# Patient Record
Sex: Male | Born: 1945 | Race: Black or African American | Hispanic: No | Marital: Married | Smoking: Never smoker
Health system: Southern US, Community
[De-identification: ages and names within clinical notes are randomized; demographics above are authoritative.]

## PROBLEM LIST (undated history)

## (undated) DIAGNOSIS — I10 Essential (primary) hypertension: Secondary | ICD-10-CM

## (undated) DIAGNOSIS — T884XXA Failed or difficult intubation, initial encounter: Secondary | ICD-10-CM

## (undated) DIAGNOSIS — I428 Other cardiomyopathies: Secondary | ICD-10-CM

## (undated) DIAGNOSIS — K219 Gastro-esophageal reflux disease without esophagitis: Secondary | ICD-10-CM

## (undated) DIAGNOSIS — Z973 Presence of spectacles and contact lenses: Secondary | ICD-10-CM

## (undated) DIAGNOSIS — K449 Diaphragmatic hernia without obstruction or gangrene: Secondary | ICD-10-CM

## (undated) DIAGNOSIS — R399 Unspecified symptoms and signs involving the genitourinary system: Secondary | ICD-10-CM

## (undated) DIAGNOSIS — M722 Plantar fascial fibromatosis: Secondary | ICD-10-CM

## (undated) DIAGNOSIS — Z8719 Personal history of other diseases of the digestive system: Secondary | ICD-10-CM

## (undated) DIAGNOSIS — Z86010 Personal history of colonic polyps: Secondary | ICD-10-CM

## (undated) DIAGNOSIS — E785 Hyperlipidemia, unspecified: Secondary | ICD-10-CM

## (undated) DIAGNOSIS — M1990 Unspecified osteoarthritis, unspecified site: Secondary | ICD-10-CM

## (undated) DIAGNOSIS — K5730 Diverticulosis of large intestine without perforation or abscess without bleeding: Secondary | ICD-10-CM

## (undated) DIAGNOSIS — E119 Type 2 diabetes mellitus without complications: Secondary | ICD-10-CM

## (undated) DIAGNOSIS — T7840XA Allergy, unspecified, initial encounter: Secondary | ICD-10-CM

## (undated) DIAGNOSIS — R3916 Straining to void: Secondary | ICD-10-CM

## (undated) DIAGNOSIS — R972 Elevated prostate specific antigen [PSA]: Secondary | ICD-10-CM

## (undated) DIAGNOSIS — R3914 Feeling of incomplete bladder emptying: Secondary | ICD-10-CM

## (undated) DIAGNOSIS — C801 Malignant (primary) neoplasm, unspecified: Secondary | ICD-10-CM

## (undated) DIAGNOSIS — N400 Enlarged prostate without lower urinary tract symptoms: Secondary | ICD-10-CM

## (undated) DIAGNOSIS — D649 Anemia, unspecified: Secondary | ICD-10-CM

## (undated) DIAGNOSIS — Z8619 Personal history of other infectious and parasitic diseases: Secondary | ICD-10-CM

## (undated) DIAGNOSIS — D689 Coagulation defect, unspecified: Secondary | ICD-10-CM

## (undated) DIAGNOSIS — Z860101 Personal history of adenomatous and serrated colon polyps: Secondary | ICD-10-CM

## (undated) HISTORY — PX: ESOPHAGOGASTRODUODENOSCOPY: SHX1529

## (undated) HISTORY — PX: CARDIAC CATHETERIZATION: SHX172

## (undated) HISTORY — DX: Malignant (primary) neoplasm, unspecified: C80.1

## (undated) HISTORY — DX: Anemia, unspecified: D64.9

## (undated) HISTORY — DX: Allergy, unspecified, initial encounter: T78.40XA

## (undated) HISTORY — DX: Plantar fascial fibromatosis: M72.2

## (undated) HISTORY — DX: Hyperlipidemia, unspecified: E78.5

## (undated) HISTORY — DX: Coagulation defect, unspecified: D68.9

## (undated) HISTORY — DX: Gastro-esophageal reflux disease without esophagitis: K21.9

## (undated) HISTORY — DX: Unspecified osteoarthritis, unspecified site: M19.90

## (undated) HISTORY — PX: APPENDECTOMY: SHX54

## (undated) HISTORY — DX: Diaphragmatic hernia without obstruction or gangrene: K44.9

## (undated) HISTORY — PX: COLONOSCOPY: SHX174

## (undated) HISTORY — PX: EYE SURGERY: SHX253

## (undated) HISTORY — DX: Failed or difficult intubation, initial encounter: T88.4XXA

---

## 1999-08-06 ENCOUNTER — Encounter: Admission: RE | Admit: 1999-08-06 | Discharge: 1999-09-08 | Payer: Self-pay | Admitting: *Deleted

## 2004-04-17 ENCOUNTER — Ambulatory Visit: Payer: Self-pay | Admitting: Family Medicine

## 2004-11-11 ENCOUNTER — Ambulatory Visit: Payer: Self-pay | Admitting: Family Medicine

## 2004-11-17 ENCOUNTER — Ambulatory Visit: Payer: Self-pay | Admitting: Family Medicine

## 2005-03-10 ENCOUNTER — Ambulatory Visit: Payer: Self-pay | Admitting: Family Medicine

## 2005-03-10 LAB — CONVERTED CEMR LAB: PSA: 0.78 ng/mL

## 2005-04-30 ENCOUNTER — Ambulatory Visit: Payer: Self-pay | Admitting: Family Medicine

## 2005-12-17 ENCOUNTER — Ambulatory Visit: Payer: Self-pay | Admitting: Family Medicine

## 2005-12-17 LAB — CONVERTED CEMR LAB: PSA: 0.86 ng/mL

## 2005-12-21 ENCOUNTER — Ambulatory Visit: Payer: Self-pay | Admitting: Family Medicine

## 2006-01-20 ENCOUNTER — Ambulatory Visit: Payer: Self-pay | Admitting: Family Medicine

## 2006-07-01 ENCOUNTER — Ambulatory Visit: Payer: Self-pay | Admitting: Family Medicine

## 2006-07-01 LAB — CONVERTED CEMR LAB
Cholesterol: 164 mg/dL (ref 0–200)
HDL: 36.2 mg/dL — ABNORMAL LOW (ref >39.0)
VLDL: 27 mg/dL (ref 0–40)

## 2006-07-06 ENCOUNTER — Ambulatory Visit: Payer: Self-pay | Admitting: Family Medicine

## 2007-01-02 ENCOUNTER — Encounter: Payer: Self-pay | Admitting: Family Medicine

## 2007-01-16 ENCOUNTER — Ambulatory Visit: Payer: Self-pay | Admitting: Family Medicine

## 2007-01-16 LAB — CONVERTED CEMR LAB
BUN: 8 mg/dL (ref 6–23)
GFR calc Af Amer: 110 mL/min
GFR calc non Af Amer: 91 mL/min
Glucose, Bld: 106 mg/dL — ABNORMAL HIGH (ref 70–99)
HDL: 30.1 mg/dL — ABNORMAL LOW (ref >39.0)
PSA: 0.95 ng/mL (ref 0.10–4.00)
Sodium: 144 meq/L (ref 135–145)
Triglycerides: 152 mg/dL — ABNORMAL HIGH (ref 0–149)

## 2007-01-17 ENCOUNTER — Ambulatory Visit: Payer: Self-pay | Admitting: Family Medicine

## 2007-01-18 ENCOUNTER — Ambulatory Visit: Payer: Self-pay | Admitting: Gastroenterology

## 2007-02-13 ENCOUNTER — Ambulatory Visit: Payer: Self-pay | Admitting: Family Medicine

## 2007-02-14 ENCOUNTER — Encounter (INDEPENDENT_AMBULATORY_CARE_PROVIDER_SITE_OTHER): Payer: Self-pay | Admitting: *Deleted

## 2007-10-09 ENCOUNTER — Ambulatory Visit: Payer: Self-pay | Admitting: Gastroenterology

## 2007-10-23 ENCOUNTER — Ambulatory Visit: Payer: Self-pay | Admitting: Gastroenterology

## 2008-04-01 ENCOUNTER — Ambulatory Visit: Payer: Self-pay | Admitting: Family Medicine

## 2008-04-01 LAB — CONVERTED CEMR LAB
ALT: 31 units/L (ref 0–53)
AST: 26 units/L (ref 0–37)
Alkaline Phosphatase: 48 units/L (ref 39–117)
Bilirubin, Direct: 0.1 mg/dL (ref 0.0–0.3)
CO2: 33 meq/L — ABNORMAL HIGH (ref 19–32)
Chloride: 101 meq/L (ref 96–112)
Cholesterol: 138 mg/dL (ref 0–200)
GFR calc non Af Amer: 72 mL/min
LDL Cholesterol: 86 mg/dL (ref 0–99)
PSA: 1.02 ng/mL (ref 0.10–4.00)
Potassium: 4.4 meq/L (ref 3.5–5.1)
TSH: 2.28 microintl units/mL (ref 0.35–5.50)
Total Bilirubin: 1.2 mg/dL (ref 0.3–1.2)
Total CHOL/HDL Ratio: 4.3

## 2008-04-03 ENCOUNTER — Ambulatory Visit: Payer: Self-pay | Admitting: Family Medicine

## 2008-04-03 DIAGNOSIS — K5730 Diverticulosis of large intestine without perforation or abscess without bleeding: Secondary | ICD-10-CM | POA: Insufficient documentation

## 2008-04-12 ENCOUNTER — Ambulatory Visit: Payer: Self-pay | Admitting: Family Medicine

## 2008-04-12 LAB — CONVERTED CEMR LAB
OCCULT 1: NEGATIVE
OCCULT 2: NEGATIVE
OCCULT 3: NEGATIVE

## 2008-04-15 ENCOUNTER — Encounter (INDEPENDENT_AMBULATORY_CARE_PROVIDER_SITE_OTHER): Payer: Self-pay | Admitting: *Deleted

## 2008-09-09 ENCOUNTER — Inpatient Hospital Stay (HOSPITAL_COMMUNITY): Admission: AD | Admit: 2008-09-09 | Discharge: 2008-09-10 | Payer: Self-pay | Admitting: Cardiology

## 2008-09-09 ENCOUNTER — Telehealth: Payer: Self-pay | Admitting: Family Medicine

## 2008-09-09 ENCOUNTER — Ambulatory Visit: Payer: Self-pay | Admitting: Family Medicine

## 2008-09-09 ENCOUNTER — Ambulatory Visit: Payer: Self-pay | Admitting: Cardiology

## 2008-09-09 DIAGNOSIS — R079 Chest pain, unspecified: Secondary | ICD-10-CM

## 2008-09-10 ENCOUNTER — Encounter: Payer: Self-pay | Admitting: Cardiology

## 2008-09-18 ENCOUNTER — Ambulatory Visit: Payer: Self-pay | Admitting: Family Medicine

## 2008-09-20 ENCOUNTER — Ambulatory Visit: Payer: Self-pay | Admitting: Cardiology

## 2008-09-20 ENCOUNTER — Encounter: Payer: Self-pay | Admitting: Cardiology

## 2008-10-14 DIAGNOSIS — K219 Gastro-esophageal reflux disease without esophagitis: Secondary | ICD-10-CM

## 2008-10-14 DIAGNOSIS — I2510 Atherosclerotic heart disease of native coronary artery without angina pectoris: Secondary | ICD-10-CM

## 2008-11-29 ENCOUNTER — Ambulatory Visit: Payer: Self-pay | Admitting: Internal Medicine

## 2008-11-29 ENCOUNTER — Inpatient Hospital Stay (HOSPITAL_COMMUNITY): Admission: EM | Admit: 2008-11-29 | Discharge: 2008-12-04 | Payer: Self-pay | Admitting: Emergency Medicine

## 2008-11-29 ENCOUNTER — Ambulatory Visit: Payer: Self-pay | Admitting: Gastroenterology

## 2008-11-29 DIAGNOSIS — D500 Iron deficiency anemia secondary to blood loss (chronic): Secondary | ICD-10-CM | POA: Insufficient documentation

## 2008-11-30 ENCOUNTER — Encounter: Payer: Self-pay | Admitting: Family Medicine

## 2008-12-09 ENCOUNTER — Ambulatory Visit: Payer: Self-pay | Admitting: Family Medicine

## 2008-12-09 ENCOUNTER — Telehealth: Payer: Self-pay | Admitting: Physician Assistant

## 2008-12-09 DIAGNOSIS — K625 Hemorrhage of anus and rectum: Secondary | ICD-10-CM

## 2008-12-10 LAB — CONVERTED CEMR LAB
HCT: 33.2 % — ABNORMAL LOW (ref 39.0–52.0)
Lymphocytes Relative: 40.1 % (ref 12.0–46.0)
Monocytes Relative: 6.5 % (ref 3.0–12.0)
Neutrophils Relative %: 46.6 % (ref 43.0–77.0)
Platelets: 394 10*3/uL (ref 150.0–400.0)
RDW: 13.3 % (ref 11.5–14.6)
WBC: 8.9 10*3/uL (ref 4.5–10.5)

## 2008-12-16 ENCOUNTER — Ambulatory Visit: Payer: Self-pay | Admitting: Family Medicine

## 2009-01-30 ENCOUNTER — Ambulatory Visit: Payer: Self-pay | Admitting: Family Medicine

## 2009-02-17 ENCOUNTER — Ambulatory Visit: Payer: Self-pay | Admitting: Family Medicine

## 2009-02-17 LAB — CONVERTED CEMR LAB
Basophils Relative: 0.7 % (ref 0.0–3.0)
MCHC: 33.8 g/dL (ref 30.0–36.0)
MCV: 86.3 fL (ref 78.0–100.0)
Neutrophils Relative %: 42.8 % — ABNORMAL LOW (ref 43.0–77.0)
RBC: 5.3 M/uL (ref 4.22–5.81)
RDW: 13.9 % (ref 11.5–14.6)

## 2009-02-20 ENCOUNTER — Ambulatory Visit: Payer: Self-pay | Admitting: Family Medicine

## 2009-04-16 ENCOUNTER — Ambulatory Visit: Payer: Self-pay | Admitting: Family Medicine

## 2009-04-16 LAB — CONVERTED CEMR LAB
ALT: 31 units/L (ref 0–53)
AST: 23 units/L (ref 0–37)
BUN: 7 mg/dL (ref 6–23)
Basophils Relative: 0.4 % (ref 0.0–3.0)
Bilirubin, Direct: 0.1 mg/dL (ref 0.0–0.3)
CO2: 31 meq/L (ref 19–32)
Chloride: 103 meq/L (ref 96–112)
Cholesterol: 166 mg/dL (ref 0–200)
Creatinine,U: 196.8 mg/dL
Eosinophils Relative: 1 % (ref 0.0–5.0)
Hemoglobin: 16 g/dL (ref 13.0–17.0)
LDL Cholesterol: 102 mg/dL — ABNORMAL HIGH (ref 0–99)
Lymphocytes Relative: 38.2 % (ref 12.0–46.0)
Microalb, Ur: 0.3 mg/dL (ref 0.0–1.9)
Monocytes Relative: 6.8 % (ref 3.0–12.0)
Neutro Abs: 4.5 10*3/uL (ref 1.4–7.7)
PSA: 0.88 ng/mL (ref 0.10–4.00)
Potassium: 4.3 meq/L (ref 3.5–5.1)
RBC: 5.56 M/uL (ref 4.22–5.81)
TSH: 1.65 microintl units/mL (ref 0.35–5.50)
Total Bilirubin: 1.1 mg/dL (ref 0.3–1.2)
Total CHOL/HDL Ratio: 4

## 2009-04-22 ENCOUNTER — Ambulatory Visit: Payer: Self-pay | Admitting: Family Medicine

## 2009-07-03 ENCOUNTER — Telehealth: Payer: Self-pay | Admitting: Family Medicine

## 2009-08-26 ENCOUNTER — Ambulatory Visit: Payer: Self-pay | Admitting: Family Medicine

## 2009-08-28 ENCOUNTER — Ambulatory Visit: Payer: Self-pay | Admitting: Cardiology

## 2009-12-16 ENCOUNTER — Encounter (INDEPENDENT_AMBULATORY_CARE_PROVIDER_SITE_OTHER): Payer: Self-pay | Admitting: *Deleted

## 2010-04-24 ENCOUNTER — Inpatient Hospital Stay (HOSPITAL_COMMUNITY)
Admission: EM | Admit: 2010-04-24 | Discharge: 2010-04-26 | Payer: Self-pay | Source: Home / Self Care | Attending: Internal Medicine | Admitting: Internal Medicine

## 2010-04-24 ENCOUNTER — Telehealth: Payer: Self-pay | Admitting: Family Medicine

## 2010-05-13 ENCOUNTER — Other Ambulatory Visit: Payer: Self-pay | Admitting: Family Medicine

## 2010-05-13 ENCOUNTER — Ambulatory Visit
Admission: RE | Admit: 2010-05-13 | Discharge: 2010-05-13 | Payer: Self-pay | Source: Home / Self Care | Attending: Family Medicine | Admitting: Family Medicine

## 2010-05-13 LAB — MICROALBUMIN / CREATININE URINE RATIO
Creatinine,U: 184.8 mg/dL
Microalb Creat Ratio: 0.8 mg/g (ref 0.0–30.0)
Microalb, Ur: 1.4 mg/dL (ref 0.0–1.9)

## 2010-05-13 LAB — HEPATIC FUNCTION PANEL
ALT: 27 U/L (ref 0–53)
AST: 26 U/L (ref 0–37)
Albumin: 3.7 g/dL (ref 3.5–5.2)
Alkaline Phosphatase: 51 U/L (ref 39–117)
Bilirubin, Direct: 0.1 mg/dL (ref 0.0–0.3)
Total Bilirubin: 0.8 mg/dL (ref 0.3–1.2)
Total Protein: 7.1 g/dL (ref 6.0–8.3)

## 2010-05-13 LAB — CBC WITH DIFFERENTIAL/PLATELET
Basophils Absolute: 0.1 10*3/uL (ref 0.0–0.1)
Basophils Relative: 0.8 % (ref 0.0–3.0)
Eosinophils Absolute: 0.1 10*3/uL (ref 0.0–0.7)
Eosinophils Relative: 1.3 % (ref 0.0–5.0)
HCT: 38.1 % — ABNORMAL LOW (ref 39.0–52.0)
Hemoglobin: 12.6 g/dL — ABNORMAL LOW (ref 13.0–17.0)
Lymphocytes Relative: 40.2 % (ref 12.0–46.0)
Lymphs Abs: 2.7 10*3/uL (ref 0.7–4.0)
MCHC: 33 g/dL (ref 30.0–36.0)
MCV: 86.7 fl (ref 78.0–100.0)
Monocytes Absolute: 0.4 10*3/uL (ref 0.1–1.0)
Monocytes Relative: 6 % (ref 3.0–12.0)
Neutro Abs: 3.4 10*3/uL (ref 1.4–7.7)
Neutrophils Relative %: 51.7 % (ref 43.0–77.0)
Platelets: 323 10*3/uL (ref 150.0–400.0)
RBC: 4.39 Mil/uL (ref 4.22–5.81)
RDW: 14.2 % (ref 11.5–14.6)
WBC: 6.7 10*3/uL (ref 4.5–10.5)

## 2010-05-13 LAB — BASIC METABOLIC PANEL
BUN: 9 mg/dL (ref 6–23)
CO2: 30 mEq/L (ref 19–32)
Calcium: 9.2 mg/dL (ref 8.4–10.5)
Chloride: 105 mEq/L (ref 96–112)
Creatinine, Ser: 0.9 mg/dL (ref 0.4–1.5)
GFR: 113.44 mL/min (ref >60.00)
Glucose, Bld: 98 mg/dL (ref 70–99)
Potassium: 4.6 mEq/L (ref 3.5–5.1)
Sodium: 140 mEq/L (ref 135–145)

## 2010-05-13 LAB — LIPID PANEL
Cholesterol: 139 mg/dL (ref 0–200)
HDL: 32.9 mg/dL — ABNORMAL LOW (ref >39.00)
LDL Cholesterol: 88 mg/dL (ref 0–99)
Total CHOL/HDL Ratio: 4
Triglycerides: 89 mg/dL (ref 0.0–149.0)
VLDL: 17.8 mg/dL (ref 0.0–40.0)

## 2010-05-13 LAB — PSA: PSA: 0.82 ng/mL (ref 0.10–4.00)

## 2010-05-13 LAB — IRON: Iron: 27 ug/dL — ABNORMAL LOW (ref 42–165)

## 2010-05-13 LAB — VITAMIN B12: Vitamin B-12: 624 pg/mL (ref 211–911)

## 2010-05-13 LAB — TSH: TSH: 1.64 u[IU]/mL (ref 0.35–5.50)

## 2010-05-21 ENCOUNTER — Ambulatory Visit
Admission: RE | Admit: 2010-05-21 | Discharge: 2010-05-21 | Payer: Self-pay | Source: Home / Self Care | Attending: Family Medicine | Admitting: Family Medicine

## 2010-06-11 NOTE — Assessment & Plan Note (Signed)
Summary: CPX/DLO   Vital Signs:  Patient profile:   65 year old male Weight:      210 pounds Temp:     97.7 degrees F oral Pulse rate:   92 / minute Pulse rhythm:   regular BP sitting:   122 / 74  (left arm) Cuff size:   large  Vitals Entered By: Sydell Axon LPN (May 21, 2010 10:54 AM) CC: 30 Minute checkup, had a colonoscopy 06/09 by Dr. Russella Dar   History of Present Illness: Pt here for Comp Exam. He had been admitted 12/16-18 for presumed diverticular bleed and was told to stop his ASA. He had had some nuts prior to the episode.  hre has no complaints today except for some mild acid reflux. He was given a script in the hosp for 40mg  of Simva but his nos today are great on 37 and he has tolerated this for many years at the 80mg  dose. I think his risk is low of myopathy.  Preventive Screening-Counseling & Management  Alcohol-Tobacco     Alcohol drinks/day: 0     Alcohol type: rare beer     Smoking Status: never     Passive Smoke Exposure: no  Caffeine-Diet-Exercise     Caffeine use/day: 0     Does Patient Exercise: no, trouble with feet.     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 Take 1 Tablet By Mouth At Bedtime 2)  Calcium 600 600 Mg Tabs (Calcium Carbonate) ....  Take 1 Tablet By Mouth Two Times A Day 3)  Fish Oil 1000 Mg Caps (Omega-3 Fatty Acids) .... Take 1 Tablet By Mouth Once A Day 4)  Vitamin C Cr 500 Mg Cr-Caps (Ascorbic Acid) .[name].. 1 Daily By Mouth 5)  Centrum Silver  Tabs (Multiple Vitamins-Minerals) .[name].. 1 Daily By Mouth 6)  Protonix 40 Mg Tbec (Pantoprazole Sodium) .[name].. 1 Daily By Mouth 7)  Aspirin 81 Mg Tbec (Aspirin) .... Take One By Mouth Daily  Current Medications (verified): 1)  Zocor 80 Mg Tabs (Simvastatin) .... Take 1 Tablet By Mouth At Bedtime 2)  Calcium 600 600 Mg Tabs (Calcium Carbonate) .... Take 1 Tablet By Mouth Two Times A Day 3)  Vitamin C Cr 500 Mg Cr-Caps (Ascorbic Acid) .[name].. 1 Daily By Mouth 4)  Centrum Silver  Tabs (Multiple Vitamins-Minerals) .[name].. 1 Daily By Mouth 5)  Protonix 40 Mg Tbec (Pantoprazole Sodium) .[name].. 1 Daily By Mouth  Allergies: No Known Drug Allergies  Past History:  Past Medical History: Last updated: 08/28/2009 1. Hyperlipidemia. 2. Diverticulosis: Lower GI diverticular bleed 7/10 3. GERD 4. LHC (5/10):  30-40% mid RCA stenosis, no obstructive disease.  EF 55%.  Past Surgical History: Last updated: 04/27/2010 Appendectomy [employer] Colonoscopy Russella Dar) divertics, negative polyps  ~04 Colonoscopy (  Russella Dar) divertics, negative polyps 10/23/07          ~14   HOSP CP Cath (Dr Shirlee Latch) 5/3-09/10/08 Cath 20% Stenosis Prox LAD EF 55% 09/10/08 HOSP Lower GI Bleed felt due to Divertic  Acute Fe Def Anemia  7/23-7/28/10 HOSP [facility] Presumed Lower GI Bleed  Divert 12/16-12/18/11  Family History: Last updated: 06-10-2010 Father: Deceased ? Estranged Mother: Died at age 72, colon cancer Brother  dec 64 lung cancer unknown type (smoke)  Brother A 30   Sister A  72 MI, with heart bypass and pace maker Sister dec 68 Renal Failure Nephrectomy not cancer HTN Sister A 58 HTN CV + Brother, sister x 2 HBP - none DM - none Breast/Ovarian/Uterine cancer  - none Colon cancer + Mother, polyps brother Depression  - none ETOH/Drug Abuse - none Stroke - none  Social History: Last updated: 10-Jun-2010 Marital Status: Married Children: none Occupation: Retired, Naval architect Tobacco Use - No.  Alcohol Use - no Regular Exercise - yes Drug Use - no  Risk Factors: Alcohol Use: 0 (06/10/2010) Caffeine Use: 0 (2010-06-10) Exercise: no, trouble with feet. (10-Jun-2010)  Risk Factors: Smoking Status: never (06-10-2010) Passive Smoke Exposure: no (06-10-10)  Family History: Father: Deceased ? 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  Alcohol Use - no Regular Exercise - yes Drug Use - no Does Patient Exercise:  no, trouble with feet.  Review of Systems General:  Denies chills, fatigue, fever, sweats, weakness, and weight loss. Eyes:  Denies blurring, discharge, and eye pain. ENT:  Denies decreased hearing, ear discharge, earache, and ringing in ears. CV:  Denies chest pain or discomfort, fainting, fatigue, palpitations, shortness of breath with exertion, swelling of feet, and swelling of hands. Resp:  Denies chest discomfort, cough, shortness of breath, sputum productive, and wheezing. GI:  Complains of bloody stools; denies abdominal pain, change in bowel habits, constipation, dark tarry stools, diarrhea, excessive appetite, gas, hemorrhoids, indigestion, loss of appetite, nausea, vomiting, vomiting blood, and yellowish skin color; no rectal bleeding  since admission in Dec, some heartburnn recently.. GU:  Denies  discharge, dysuria, nocturia, and urinary frequency. MS:  Denies joint pain, muscle aches, cramps, and stiffness; recent plantar fasciitis.. Derm:  Denies dryness, itching, and rash. Neuro:  Denies numbness, poor balance, tingling, and tremors.  Physical Exam  General:  Well-developed,well-nourished,in no acute distress; alert,appropriate and cooperative throughout examination.  Head:  Normocephalic and atraumatic without obvious abnormalities. No apparent alopecia or balding. Sinuses NT. Eyes:  Conjunctiva clear bilaterally.  Ears:  External ear exam shows no significant lesions or deformities.  Otoscopic examination reveals clear canals, tympanic membranes are intact bilaterally without bulging, retraction, inflammation or discharge. Hearing is grossly normal bilaterally. Nose:  External nasal examination shows no deformity or inflammation. Nasal mucosa are pink and moist without lesions or exudates. Mouth:  Oral mucosa and oropharynx without lesions or exudates.  Teeth in good repair. Mucous membranes pink. Neck:  No deformities, masses, or tenderness noted. Chest  Wall:  No deformities, masses, tenderness or gynecomastia noted.  Breasts:  No masses or gynecomastia noted Lungs:  Normal respiratory effort, chest expands symmetrically. Lungs are clear to auscultation, no crackles or wheezes. Heart:  Normal rate and regular rhythm. S1 and S2 normal without gallop, murmur, click, rub or other extra sounds. Abdomen:  Bowel sounds positive,abdomen soft and non-tender without masses, organomegaly or hernias noted. Rectal:  No external abnormalities noted. Normal sphincter tone. No rectal masses or tenderness. G neg. Genitalia:  Testes bilaterally descended without nodularity, tenderness or masses. No scrotal masses or lesions. No penis lesions or urethral discharge. Prostate:  Prostate gland firm and smooth, no enlargement, nodularity, tenderness, mass, asymmetry or induration. 30-40gms. Msk:  No  deformity or scoliosis noted of thoracic or lumbar spine.   Pulses:  R and L carotid,radial,femoral,dorsalis pedis and posterior tibial pulses are full and equal bilaterally Extremities:  No clubbing, cyanosis, edema, or deformity noted with normal full range of motion of all joints.   Neurologic:  No cranial nerve deficits noted. Station and gait are normal. Sensory, motor and coordinative functions appear intact. Skin:  Intact without suspicious lesions or rashes Cervical Nodes:  No lymphadenopathy noted Inguinal Nodes:  No significant adenopathy Psych:  Cognition and judgment appear intact. Alert and cooperative with normal attention span and concentration. No apparent delusions, illusions, hallucinations   Impression & Recommendations:  Problem # 1:  HEALTH MAINTENANCE EXAM (ICD-V70.0)  Reviewed preventive care protocols, scheduled due services, and updated immunizations. Discussed Zostavax.  Problem # 2:  HEMORRHAGE OF RECTUM AND ANUS (ICD-569.3) Assessment: Improved Resolved.  Problem # 3:  CAD, UNSPECIFIED SITE (ICD-414.00) Assessment: Unchanged  Stable. The following medications were removed from the medication list:    Aspirin 81 Mg Tbec (Aspirin) .[name]... Take one by mouth daily  Labs Reviewed: Chol: 139 (05/13/2010)   HDL: 32.90 (05/13/2010)   LDL: 88 (05/13/2010)   TG: 89.0 (05/13/2010)  Problem # 4:  HYPERCHOLESTEROLEMIA,  222/TRIG 254/ LDL 132 (ICD-272.0) Assessment: Unchanged Great control. With risk of and past h/o CAD, and the fact he has been on this for many years (at least since 08) feel the risk of myopathy outweighed by the risk of recurrent CAD. Cont 80mg  Simva. His updated medication list for this problem includes:    Zocor 80 Mg Tabs (Simvastatin) .[name]... Take 1 tablet by mouth at bedtime  Labs Reviewed: SGOT: 26 (05/13/2010)   SGPT: 27 (05/13/2010)   HDL:32.90 (05/13/2010), 40.80 (04/16/2009)  LDL:88 (05/13/2010), 102 (74/25/9563)  Chol:139 (05/13/2010),  166 (04/16/2009)  Trig:89.0 (05/13/2010), 118.0 (04/16/2009)  Problem # 5:  HYPERGLYCEMIA, 114 (ICD-790.29) Assessment: Improved  Euglycemic today. Kidney fctn fine.  Labs Reviewed: Creat: 0.9 (05/13/2010)     Problem # 6:  GERD (ICD-530.81) Assessment: Unchanged Slight sxs today. Cont Protonix and again covered prophylaxis. His updated medication list for this problem includes:    Protonix 40 Mg Tbec (Pantoprazole sodium) .[name]... 1 daily by mouth  Diagnostics Reviewed:  Discussed lifestyle modifications, diet, antacids/medications, and preventive measures. Handout provided.   Problem # 7:  SCREENING FOR MALIGNANT NEOPLASM, PROSTATE (ICD-V76.44) Assessment: Unchanged Stable exam and PSA.  Complete Medication List: 1)  Zocor 80 Mg Tabs (Simvastatin) .... Take 1 tablet by mouth at bedtime 2)  Calcium 600 600 Mg Tabs (Calcium carbonate) .... Take 1 tablet by mouth two times a day 3)  Vitamin C Cr 500 Mg Cr-caps (Ascorbic acid) .[name].. 1 daily by mouth 4)  Centrum Silver Tabs (Multiple vitamins-minerals) .[name].. 1 daily  by mouth 5)  Protonix 40 Mg Tbec (Pantoprazole sodium) .[name].. 1 daily by mouth  Patient Instructions: 1)  RTC one year, sooner as needed. 2)  Look into Zostavax. Prescriptions: PROTONIX 40 MG TBEC (PANTOPRAZOLE SODIUM) 1 daily by mouth  #30 x 12   Entered and Authorized by:   Shaune Leeks MD   Signed by:   Shaune Leeks MD on 05/21/2010   Method used:   Electronically to        [facility] [address] (retail)       [address], [address]       [place], [place]  [address]       Ph: [phone]       Fax: [phone]   RxID:   [id number] ZOCOR 80 MG TABS (SIMVASTATIN) Take 1 tablet by mouth at bedtime  #30 Each x 12   Entered and Authorized by:   Shaune Leeks MD   Signed by:   Shaune Leeks MD on 05/21/2010   Method used:   Electronically to        [facility] [address] (retail)       [address]       [place], [place]  [address]       Ph: [phone]       Fax: [phone]   RxID:   [id number]    Orders Added: 1)  Est. Patient 40-64 years [[id number]]   Immunization History:  Pneumovax Immunization History:    Pneumovax:  pneumovax (04/25/2010)  Influenza Immunization History:    Influenza:  fluvax 3+ (04/25/2010)   Immunization History:  Pneumovax Immunization History:    Pneumovax:  Pneumovax (04/25/2010)  Influenza Immunization History:    Influenza:  Fluvax 3+ (04/25/2010)  Current Allergies (reviewed today): No known allergies

## 2010-06-11 NOTE — Assessment & Plan Note (Signed)
Summary: f1y   Visit Type:  Follow-up Referring Provider:  Dr. Marca Ancona Primary Provider:  Shaune Leeks MD  CC:  Patient is having SOB due to having stomach gas.Chest discomfort occasionally .  History of Present Illness: 65 yo with history of hyperlipidemia was seen in the office last year with significant exertional chest pain.  We admitted him for LHC, which showed only 30-40 mRCA stenosis and normal LV function. Given normal cath, it was thought that his symptoms may be GI-related (significant GERD).  He is still having GERD-type symptoms despite the use of Protonix.  He has had no exertional chest pain.  He is doing well from a cardiopulmonary standpoint.    Labs (5/10):  LDL 74, HDL 26, TG 92, creatinine 6.43 Labs (12/10): LDL 102, HDL 41  Current Medications (verified): 1)  Zocor 80 Mg Tabs (Simvastatin) .... Take 1 Tablet By Mouth At Bedtime 2)  Calcium 600 600 Mg Tabs (Calcium Carbonate) .... Take 1 Tablet By Mouth Two Times A Day 3)  Fish Oil 1000 Mg Caps (Omega-3 Fatty Acids) .... Take 1 Tablet By Mouth Once A Day 4)  Vitamin C Cr 500 Mg Cr-Caps (Ascorbic Acid) .[name].. 1 Daily By Mouth 5)  Centrum Silver  Tabs (Multiple Vitamins-Minerals) .[name].. 1 Daily By Mouth 6)  Protonix 40 Mg Tbec (Pantoprazole Sodium) .[name].. 1 Daily By Mouth 7)  Aspirin 81 Mg Tbec (Aspirin) .... Take One By Mouth Daily  Allergies (verified): No Known Drug Allergies  Past History:  Past Medical History: 1. Hyperlipidemia. 2. Diverticulosis: Lower GI diverticular bleed 7/10 3. GERD 4. LHC (5/10):  30-40% mid RCA stenosis, no obstructive disease.  EF 55%.  Family History: Reviewed history from 04/22/2009 and no changes required. Father: Deceased ? Estranged Mother: Died at age 70, colon cancer Brother  dec 64 lung cancer unknown type (smoke)  Brother A 67   Sister A  43 MI, with heart bypass and pace maker Sister A 67 Nephrectomy not cancer HTN Sister A 57 HTN CV + Brother, sister x  2 HBP - none DM - none Breast/Ovarian/Uterine cancer  - none Colon cancer + Mother, polyps brother Depression - none ETOH/Drug Abuse - none Stroke - none  Social History: Reviewed history from 10/14/2008 and no changes required. 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  Alcohol Use - no Regular Exercise - yes Drug Use - no  Vital Signs:  Patient profile:   65 year old male Height:      72 inches Weight:      205.50 pounds Pulse rate:   67 / minute BP sitting:   122 / 74  (left arm) Cuff size:   large  Vitals Entered By: Mercer Pod (August 28, 2009 9:41 AM)  Physical Exam  General:  Well developed, well nourished, in no acute distress. Neck:  Neck supple, no JVD. No masses, thyromegaly or abnormal cervical nodes. Lungs:  Clear bilaterally to auscultation and percussion. Heart:  Non-displaced PMI, chest non-tender; regular rate and rhythm, S1, S2 without murmurs, rubs or gallops. Carotid upstroke normal, no bruit.Pedals normal pulses. No edema, no varicosities. Abdomen:  Bowel sounds positive; abdomen soft and non-tender without masses, organomegaly, or hernias noted. No hepatosplenomegaly. Extremities:  No clubbing or cyanosis. Neurologic:  Alert and oriented x 3. Psych:  Normal affect.   Impression & Recommendations:  Problem # 1:  CAD, UNSPECIFIED SITE (ICD-414.00)  Nonobstructive CAD on cath in 5/10.  No cardiac symptoms.  Continue ASA 81, suggest goal LDL on Zocor at least < 100 given known CAD and < 70 ideally.  Can followup in this office as needed.

## 2010-06-11 NOTE — Progress Notes (Signed)
Summary: ok to take anti inflammatory?  Phone Note Call from Patient Call back at Home Phone [phone]   Caller: Patient Call For: Shaune Leeks MD Summary of Call: Pt states he has plantars fasciitis and he is asking if he can take an over the counter anti inflammatory.  He had some rectal bleeding problems before so he wanted to ask first. Initial call taken by: Lowella Petties CMA,  July 03, 2009 2:17 PM  Follow-up for Phone Call        Preferable to take TYL ES 2 three times a day regularly. Is risk of rebleeding with NSAID. Follow-up by: Shaune Leeks MD,  July 03, 2009 5:05 PM  Additional Follow-up for Phone Call Additional follow up Details #1::        Advised pt. Additional Follow-up by: Lowella Petties CMA,  July 03, 2009 5:08 PM

## 2010-06-11 NOTE — Letter (Signed)
Summary: Nadara Eaton letter  [place] [facility]  [address] [place], [place] [address]   Phone: [phone]  Fax: [phone]       12/16/2009 MRN: [id number]  AISON MALVEAUX [address] [place], [place]  [address]  Dear Mr. Oneita Jolly [facility] [place] and [place] announce the retirement of Arta Silence, M.D., from full-time practice at the [facility] office effective November 06, 2009 and his plans of returning part-time.  It is important to Dr. Hetty Ely and to our practice that you understand that [facility] has seven physicians in our office for your health care needs.  We will continue to offer the same exceptional care that you have today.    Dr. Hetty Ely has spoken to many of you about his plans for retirement and returning part-time in the fall.   We will continue to work with you through the transition to schedule appointments for you in the office and meet the high standards that [facility] is committed to.   Again, it is with great pleasure that we share the news that Dr. Hetty Ely will return to [facility] in October of 2011 with a reduced schedule.    If you have any questions, or would like to request an appointment with one of our physicians, please call us at [phone] and press the option for Scheduling an appointment.  We take pleasure in providing you with excellent patient care and look forward to seeing you at your next office visit.  Our [facility] Physicians are:  Tillman Abide, M.D. Laurita Quint, M.D. Roxy Manns, M.D. Kerby Nora, M.D. Hannah Beat, M.D. Ruthe Mannan, M.D. We proudly welcomed Raechel Ache, M.D. and Eustaquio Boyden, M.D. to the practice in July/August 2011.  Sincerely,  [facility]

## 2010-06-11 NOTE — Assessment & Plan Note (Signed)
Summary: STOMACH/CLE   Vital Signs:  Patient profile:   65 year old male Weight:      207.50 pounds BMI:     28.24 Temp:     98.4 degrees F oral Pulse rate:   72 / minute Pulse rhythm:   regular BP sitting:   130 / 80  (left arm) Cuff size:   regular  Vitals Entered By: Sydell Axon LPN (August 26, 2009 10:04 AM) CC: Gas, stomach pain and some SOB   History of Present Illness: Last week started having stomach pain. He has pain in the xyphoid area of the abd and Tums occas helps. He also has new onset of acute SOB with carrying a five gallon bucket full of material, walking up a hill. etc. It gets better after sitting down but reoccurs if tries again. He was seen by Cardiology last year and had catheterization but these sxs are acutely new. Cath somewhat slightly indeterminant from vascular ectasia.  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 Take 1 Tablet By Mouth At Bedtime 2)  Calcium 600 600 Mg Tabs (Calcium Carbonate) .... Take 1 Tablet By Mouth Two Times A Day 3)  Fish Oil 1000 Mg Caps (Omega-3 Fatty Acids) .... Take 1 Tablet By Mouth Once A Day 4)  Vitamin C Cr 500 Mg Cr-Caps (Ascorbic Acid) .[name].. 1 Daily By Mouth 5)  Centrum Silver  Tabs (Multiple Vitamins-Minerals) .[name].. 1 Daily By Mouth 6)  Protonix 40 Mg Tbec (Pantoprazole Sodium)  .[name].. 1 Daily By Mouth 7)  Iron 325 (65 Fe) Mg Tabs (Ferrous Sulfate) .... Take 2 By Mouth Daily 8)  Aspirin 81 Mg Tbec (Aspirin) .... Take One By Mouth Daily  Allergies: No Known Drug Allergies  Physical Exam  General:  Well-developed,well-nourished,in no acute distress; alert,appropriate and cooperative throughout examination, looks comfortable.  Head:  Normocephalic and atraumatic without obvious abnormalities. No apparent alopecia or balding. Eyes:  Conjunctiva clear bilaterally.  Ears:  External ear exam shows no significant lesions or deformities.  Otoscopic examination reveals clear canals, tympanic membranes are intact bilaterally without bulging, retraction, inflammation or discharge. Hearing is grossly normal bilaterally. Nose:  External nasal examination shows no deformity or inflammation. Nasal mucosa are pink and moist without lesions or exudates. Mouth:  Oral mucosa and oropharynx without lesions or exudates.  Teeth in good repair. Mucous membranes pink. Neck:  No deformities, masses, or tenderness noted. Lungs:  Normal respiratory effort, chest expands symmetrically. Lungs are clear to auscultation, no crackles or wheezes. Heart:  Normal rate and regular rhythm. S1 and S2 normal without gallop, murmur, click, rub or other extra sounds. Extremities:  No clubbing, cyanosis, edema, or deformity noted with normal full range of motion of all joints.     Impression & Recommendations:  Problem # 1:  GERD (ICD-530.81) Assessment Deteriorated Pain at the Xyphoid area different from chest  pain below. Will have him take Omeprazole regularly 45 mins prior to brfst for two months and Tums for acute discomfort. Avopid nicotine, caffiene, tomato products, eat smaller meals more frequently and come back if worsens. His updated medication list for this problem includes:    Protonix 40 Mg Tbec (Pantoprazole sodium) .[name]... 1 daily by mouth  Problem # 2:  CHEST PAIN UNSPECIFIED  (ICD-786.50) Assessment: Deteriorated New onset chest pain with exertion or walking into a wind, relieved with sitting down...seen last year and cathed by Cardiology. Treated medically for ectatic vessel, otherwise clean cath. Will refer back just to be careful. Sxs classic. EKG Sinus Rhythm with poor R wave progression. Orders: Cardiology Referral (Cardiology)  Complete Medication List: 1)  Zocor 80 Mg Tabs (Simvastatin) .... Take 1 tablet by mouth at bedtime 2)  Calcium 600 600 Mg Tabs (Calcium carbonate) .... Take 1 tablet by mouth two times a day 3)  Fish Oil 1000 Mg Caps (Omega-3 fatty acids) .... Take 1 tablet by mouth once a day 4)  Vitamin C Cr 500 Mg Cr-caps (Ascorbic acid) .[name].. 1 daily by mouth 5)  Centrum Silver Tabs (Multiple vitamins-minerals) .[name].. 1 daily by mouth 6)  Protonix 40 Mg Tbec (Pantoprazole sodium) .[name].. 1 daily by mouth 7)  Aspirin 81 Mg Tbec (Aspirin) .... Take one by mouth daily  Patient Instructions: 1)  Refer to Cardiology. 2)  Take Omeprazole regularly 45 mins before brfst. Avoid NSAIDS. 3)  Eat as discussed for prophylaxis. 4)  Keep appt late Jun as scheduled.  Current Allergies (reviewed today): No known allergies

## 2010-06-11 NOTE — Progress Notes (Signed)
Summary: rectal bleeding  Phone Note Call from Patient   Caller: Patient Summary of Call: Pt states he is having rectal bleeding, started this afternoon.  He says he feels like he has to have a BM but it is only blood.  He said he had this problem before and spent 4 days in the hospital.  He asked if he should go to ER, I advised yes. Initial call taken by: Lowella Petties CMA, AAMA,  April 24, 2010 3:49 PM  Follow-up for Phone Call        agreed.  Follow-up by: Crawford Givens MD,  April 24, 2010 3:54 PM

## 2010-07-20 LAB — COMPREHENSIVE METABOLIC PANEL
Albumin: 3.8 g/dL (ref 3.5–5.2)
Alkaline Phosphatase: 53 U/L (ref 39–117)
BUN: 11 mg/dL (ref 6–23)
BUN: 9 mg/dL (ref 6–23)
CO2: 26 mEq/L (ref 19–32)
CO2: 26 mEq/L (ref 19–32)
Calcium: 8.5 mg/dL (ref 8.4–10.5)
Chloride: 105 mEq/L (ref 96–112)
Chloride: 109 mEq/L (ref 96–112)
Creatinine, Ser: 0.85 mg/dL (ref 0.4–1.5)
Creatinine, Ser: 0.94 mg/dL (ref 0.4–1.5)
GFR calc non Af Amer: >60 mL/min (ref >60)
GFR calc non Af Amer: >60 mL/min (ref >60)
Glucose, Bld: 131 mg/dL — ABNORMAL HIGH (ref 70–99)
Glucose, Bld: 139 mg/dL — ABNORMAL HIGH (ref 70–99)
Potassium: 4.1 mEq/L (ref 3.5–5.1)
Total Bilirubin: 0.4 mg/dL (ref 0.3–1.2)
Total Bilirubin: 0.8 mg/dL (ref 0.3–1.2)

## 2010-07-20 LAB — APTT: aPTT: 32 seconds (ref 24–37)

## 2010-07-20 LAB — PHOSPHORUS: Phosphorus: 2.5 mg/dL (ref 2.3–4.6)

## 2010-07-20 LAB — CBC
HCT: 36 % — ABNORMAL LOW (ref 39.0–52.0)
HCT: 36.4 % — ABNORMAL LOW (ref 39.0–52.0)
HCT: 38.7 % — ABNORMAL LOW (ref 39.0–52.0)
HCT: 44 % (ref 39.0–52.0)
Hemoglobin: 11.9 g/dL — ABNORMAL LOW (ref 13.0–17.0)
Hemoglobin: 12.2 g/dL — ABNORMAL LOW (ref 13.0–17.0)
Hemoglobin: 12.5 g/dL — ABNORMAL LOW (ref 13.0–17.0)
Hemoglobin: 13 g/dL (ref 13.0–17.0)
Hemoglobin: 14.9 g/dL (ref 13.0–17.0)
MCH: 29.5 pg (ref 26.0–34.0)
MCH: 29.7 pg (ref 26.0–34.0)
MCHC: 33.1 g/dL (ref 30.0–36.0)
MCHC: 33.4 g/dL (ref 30.0–36.0)
MCHC: 33.5 g/dL (ref 30.0–36.0)
MCHC: 33.7 g/dL (ref 30.0–36.0)
MCV: 87.5 fL (ref 78.0–100.0)
MCV: 87.8 fL (ref 78.0–100.0)
MCV: 87.9 fL (ref 78.0–100.0)
MCV: 88.4 fL (ref 78.0–100.0)
Platelets: 264 10*3/uL (ref 150–400)
RBC: 4.24 MIL/uL (ref 4.22–5.81)
RBC: 5.01 MIL/uL (ref 4.22–5.81)
RDW: 13.1 % (ref 11.5–15.5)
WBC: 6.4 10*3/uL (ref 4.0–10.5)
WBC: 8.7 10*3/uL (ref 4.0–10.5)

## 2010-07-20 LAB — HEMOGLOBIN AND HEMATOCRIT, BLOOD
HCT: 34.2 % — ABNORMAL LOW (ref 39.0–52.0)
HCT: 34.6 % — ABNORMAL LOW (ref 39.0–52.0)
HCT: 35.7 % — ABNORMAL LOW (ref 39.0–52.0)
Hemoglobin: 11.3 g/dL — ABNORMAL LOW (ref 13.0–17.0)
Hemoglobin: 11.7 g/dL — ABNORMAL LOW (ref 13.0–17.0)

## 2010-07-20 LAB — MRSA PCR SCREENING: MRSA by PCR: NEGATIVE

## 2010-07-20 LAB — PROTIME-INR
INR: 1.1 (ref 0.00–1.49)
Prothrombin Time: 13.5 seconds (ref 11.6–15.2)
Prothrombin Time: 14.4 seconds (ref 11.6–15.2)

## 2010-07-20 LAB — DIFFERENTIAL
Basophils Absolute: 0.1 10*3/uL (ref 0.0–0.1)
Basophils Absolute: 0.1 10*3/uL (ref 0.0–0.1)
Basophils Relative: 1 % (ref 0–1)
Eosinophils Relative: 1 % (ref 0–5)
Eosinophils Relative: 2 % (ref 0–5)
Lymphocytes Relative: 29 % (ref 12–46)
Lymphocytes Relative: 42 % (ref 12–46)
Monocytes Absolute: 0.4 10*3/uL (ref 0.1–1.0)
Neutro Abs: 3.1 10*3/uL (ref 1.7–7.7)
Neutro Abs: 5.4 10*3/uL (ref 1.7–7.7)
Neutrophils Relative %: 64 % (ref 43–77)

## 2010-07-20 LAB — GLUCOSE, CAPILLARY

## 2010-07-20 LAB — MAGNESIUM: Magnesium: 2 mg/dL (ref 1.5–2.5)

## 2010-08-16 LAB — TYPE AND SCREEN
ABO/RH(D): B POS
Antibody Screen: NEGATIVE
Antibody Screen: NEGATIVE

## 2010-08-16 LAB — BASIC METABOLIC PANEL
CO2: 27 mEq/L (ref 19–32)
Calcium: 8.4 mg/dL (ref 8.4–10.5)
Chloride: 103 mEq/L (ref 96–112)
GFR calc Af Amer: >60 mL/min (ref >60)
Potassium: 4 mEq/L (ref 3.5–5.1)
Sodium: 136 mEq/L (ref 135–145)

## 2010-08-16 LAB — CBC
HCT: 32.3 % — ABNORMAL LOW (ref 39.0–52.0)
HCT: 45.1 % (ref 39.0–52.0)
Hemoglobin: 10.9 g/dL — ABNORMAL LOW (ref 13.0–17.0)
Hemoglobin: 15.2 g/dL (ref 13.0–17.0)
MCHC: 33.7 g/dL (ref 30.0–36.0)
MCHC: 34 g/dL (ref 30.0–36.0)
MCV: 88.7 fL (ref 78.0–100.0)
MCV: 89.2 fL (ref 78.0–100.0)
Platelets: 215 10*3/uL (ref 150–400)
Platelets: 242 10*3/uL (ref 150–400)
RBC: 5.09 MIL/uL (ref 4.22–5.81)
RDW: 13.6 % (ref 11.5–15.5)
WBC: 6 10*3/uL (ref 4.0–10.5)
WBC: 6.8 10*3/uL (ref 4.0–10.5)
WBC: 7.3 10*3/uL (ref 4.0–10.5)

## 2010-08-16 LAB — ABO/RH: ABO/RH(D): B POS

## 2010-08-16 LAB — CARDIAC PANEL(CRET KIN+CKTOT+MB+TROPI)
CK, MB: 1.2 ng/mL (ref 0.3–4.0)
Relative Index: 1.2 (ref 0.0–2.5)
Relative Index: 1.2 (ref 0.0–2.5)
Troponin I: 0.02 ng/mL (ref 0.00–0.06)

## 2010-08-16 LAB — DIFFERENTIAL
Lymphocytes Relative: 39 % (ref 12–46)
Lymphs Abs: 2.9 10*3/uL (ref 0.7–4.0)
Monocytes Relative: 6 % (ref 3–12)
Neutrophils Relative %: 51 % (ref 43–77)

## 2010-08-16 LAB — LIPID PANEL
Cholesterol: 107 mg/dL (ref 0–200)
HDL: 21 mg/dL — ABNORMAL LOW (ref >39)

## 2010-08-16 LAB — COMPREHENSIVE METABOLIC PANEL
Albumin: 3.8 g/dL (ref 3.5–5.2)
Alkaline Phosphatase: 55 U/L (ref 39–117)
BUN: 13 mg/dL (ref 6–23)
Calcium: 9 mg/dL (ref 8.4–10.5)
Creatinine, Ser: 0.93 mg/dL (ref 0.4–1.5)
Glucose, Bld: 136 mg/dL — ABNORMAL HIGH (ref 70–99)
Potassium: 3.8 mEq/L (ref 3.5–5.1)
Total Protein: 6.9 g/dL (ref 6.0–8.3)

## 2010-08-16 LAB — HEMOGLOBIN AND HEMATOCRIT, BLOOD
HCT: 30.7 % — ABNORMAL LOW (ref 39.0–52.0)
HCT: 37.4 % — ABNORMAL LOW (ref 39.0–52.0)
HCT: 39.9 % (ref 39.0–52.0)
Hemoglobin: 10.4 g/dL — ABNORMAL LOW (ref 13.0–17.0)

## 2010-08-16 LAB — PREPARE RBC (CROSSMATCH)

## 2010-08-16 LAB — PROTIME-INR
INR: 1 (ref 0.00–1.49)
Prothrombin Time: 13.6 seconds (ref 11.6–15.2)

## 2010-08-16 LAB — TROPONIN I: Troponin I: 0.02 ng/mL (ref 0.00–0.06)

## 2010-08-18 LAB — CBC
HCT: 43.9 % (ref 39.0–52.0)
Hemoglobin: 15 g/dL (ref 13.0–17.0)
Hemoglobin: 15.1 g/dL (ref 13.0–17.0)
MCHC: 34.2 g/dL (ref 30.0–36.0)
MCHC: 34.7 g/dL (ref 30.0–36.0)
Platelets: 229 10*3/uL (ref 150–400)
RBC: 4.91 MIL/uL (ref 4.22–5.81)
RDW: 13.1 % (ref 11.5–15.5)
RDW: 13.4 % (ref 11.5–15.5)

## 2010-08-18 LAB — BASIC METABOLIC PANEL
CO2: 28 mEq/L (ref 19–32)
Calcium: 8.7 mg/dL (ref 8.4–10.5)
GFR calc Af Amer: >60 mL/min (ref >60)
Glucose, Bld: 108 mg/dL — ABNORMAL HIGH (ref 70–99)
Potassium: 4 mEq/L (ref 3.5–5.1)
Sodium: 141 mEq/L (ref 135–145)

## 2010-08-18 LAB — COMPREHENSIVE METABOLIC PANEL
ALT: 26 U/L (ref 0–53)
AST: 25 U/L (ref 0–37)
Albumin: 3.5 g/dL (ref 3.5–5.2)
Alkaline Phosphatase: 54 U/L (ref 39–117)
Calcium: 9 mg/dL (ref 8.4–10.5)
GFR calc Af Amer: >60 mL/min (ref >60)
Glucose, Bld: 132 mg/dL — ABNORMAL HIGH (ref 70–99)
Potassium: 3.9 mEq/L (ref 3.5–5.1)
Sodium: 140 mEq/L (ref 135–145)
Total Protein: 6.3 g/dL (ref 6.0–8.3)

## 2010-08-18 LAB — PROTIME-INR
INR: 1.1 (ref 0.00–1.49)
Prothrombin Time: 14 seconds (ref 11.6–15.2)

## 2010-08-18 LAB — CARDIAC PANEL(CRET KIN+CKTOT+MB+TROPI)
CK, MB: 2.1 ng/mL (ref 0.3–4.0)
Relative Index: 1.2 (ref 0.0–2.5)
Relative Index: 1.2 (ref 0.0–2.5)
Relative Index: 1.3 (ref 0.0–2.5)
Total CK: 147 U/L (ref 7–232)
Total CK: 167 U/L (ref 7–232)
Troponin I: 0.01 ng/mL (ref 0.00–0.06)
Troponin I: 0.01 ng/mL (ref 0.00–0.06)

## 2010-08-18 LAB — HEPARIN LEVEL (UNFRACTIONATED): Heparin Unfractionated: 1.27 IU/mL — ABNORMAL HIGH (ref 0.30–0.70)

## 2010-08-18 LAB — TSH: TSH: 1.517 u[IU]/mL (ref 0.350–4.500)

## 2010-08-18 LAB — LIPID PANEL
Cholesterol: 118 mg/dL (ref 0–200)
HDL: 26 mg/dL — ABNORMAL LOW (ref >39)

## 2010-09-22 NOTE — Consult Note (Signed)
NAMEDISHAWN, BHARGAVA NO.:  [id number]   MEDICAL RECORD NO.:  [id number]          PATIENT TYPE:  INP   LOCATION:  1445                         FACILITY:  [facility]   PHYSICIAN:  Rachael Fee, MD   DATE OF BIRTH:  1945-10-05   DATE OF CONSULTATION:  11/30/2008  DATE OF DISCHARGE:                                 CONSULTATION   PRIMARY CARE PHYSICIAN:  Arta Silence, MD   I was asked to see the patient regarding rectal bleeding by the [facility] physician.   CHIEF COMPLAINT:  Bright red blood per rectum.   HISTORY OF PRESENT ILLNESS:  Mr. Kemmerling is a very pleasant 65 year old  man. known to Dr. Claudette Head from gastroenterology, for a family  history of colon cancer.  His mother was diagnosed with colon cancer in  her 107s.  He, himself, has undergone at least 2 colonoscopies with Dr.  Russella Dar, the most recent in June 2009.  This was a completely normal  examination except for descending colon and sigmoid colon  diverticulosis.  Mr. Wishon has never had troubles with GI bleeding  previously.  Yesterday, at about 5 p.m., he began having completely  painless bright red blood per rectum.  He had the urge to move his  bowels 6-7 times, large volume at first, purely blood which was bright  red.  He made it to the [facility] Emergency Room where his admitting  hemoglobin was found to be 15.2, normal platelets, normal coags.  Since  admission, he had a fairly uneventful day throughout most of the day  today until the evening hours, around 7 p.m., when he again started  having bright red blood per rectum, although at this time, much lower  volume per his report.  He has had no abdominal pains through any of  this.  Interestingly, he started taking meloxicam for some foot pains 2  weeks prior to this.  He has had no shortness of breath, no chest pains,  no nausea, no vomiting, no weight loss.   REVIEW OF SYSTEMS:  Pertinent positive and negatives were as  noted  above; otherwise complete review of systems was negative.   PAST MEDICAL HISTORY:  1. Coronary artery disease with cardiac catheterization done 2010.  2. Elevated lipids.  3. Diverticulosis as noted above.   ALLERGIES:  No known drug allergies.   CURRENT MEDICATIONS:  1. Zocor 20 mg nightly.  2. Aspirin 81 mg daily,  3. Meloxicam 1 pill once daily.   SOCIAL HISTORY:  Nonsmoker, rarely drinks alcohol, married.   FAMILY HISTORY:  Mother had colon cancer in her 67s.   RECENT LABORATORY TESTS:  Hemoglobin 13.0, he has received no blood  products today.  Basic metabolic profile normal.   PHYSICAL EXAMINATION:  Temperature 98.3, pulse 69, blood pressure  119/69, oxygen saturation 97% in room air.  CONSTITUTIONAL:  Generally well-appearing.  NEUROLOGIC:  Alert and oriented x3.  EYES:  Extraocular muscles intact.  MOUTH:  Oropharynx moist, no lesions.  NECK:  Supple, no lymphadenopathy.  CARDIOVASCULAR:  Regular rate and rhythm.  LUNGS:  Clear to auscultation bilaterally.  ABDOMEN:  Soft, nontender, nondistended, normal bowel sounds.  EXTREMITIES: No lower extremity edema.  SKIN: No rash or lesions on the visible extremities.  ANORECTAL EXAMINATION:  No obvious external hemorrhoids.   ASSESSMENT AND PLAN:  This is a 65 year old man with painless rectal  bleeding.  This is likely diverticular in origin and already seems to be  slowing.  His hemoglobin was 16 on admission and has drifted down to 13.  I think we will simply observe him since he had a very recent  colonoscopy, within the last year.  Meloxicam was started 2 weeks prior  to his bleeding and perhaps that is contributing.  I recommend that he  not restart that and he actually has not noticed much improvement in his  foot pain since starting the meloxicam anyway.  He will get serial blood  counts and will be clinically monitored.  If his bleeding does not stop  or certainly if it speeds up, we will have to consider  repeat  colonoscopy.      Rachael Fee, MD  Electronically Signed     DPJ/MEDQ  D:  11/30/2008  T:  12/01/2008  Job:  [id number]   cc:   Venita Lick. Russella Dar, MD, FACG  [address]  [place]  [place] [address]   Arta Silence, MD  Fax: [phone]

## 2010-09-22 NOTE — Discharge Summary (Signed)
Paul Fry, SWINDLE NO.:  [id number]   MEDICAL RECORD NO.:  [id number]          PATIENT TYPE:  INP   LOCATION:  4732                         FACILITY:  [facility]   PHYSICIAN:  Marca Ancona, MD      DATE OF BIRTH:  February 14, 1946   DATE OF ADMISSION:  09/09/2008  DATE OF DISCHARGE:  09/10/2008                               DISCHARGE SUMMARY   PRIMARY CARDIOLOGIST:  Marca Ancona, MD   PRIMARY CARE Carmita Boom:  Arta Silence, MD   DISCHARGE DIAGNOSIS:  Chest pain.   SECONDARY DIAGNOSES:  1. Nonobstructive coronary artery disease, likes cardiac      catheterization this admission, hyperlipidemia.  2. History of diverticulosis.   ALLERGIES:  No known drug allergies.   PROCEDURE:  Left heart cardiac catheterization revealing minimal  nonobstructive CAD with an EF of 55% and no regional wall motion  abnormalities.   HISTORY OF PRESENT ILLNESS:  A 65 year old African American male without  prior cardiac history.  He was seen by Dr. Shirlee Latch in our [facility] on Sep 09, 2008, secondary to a 2-week history of intermittent  chest discomfort and burning.  Symptoms were concerning for angina and  decision was made to admit to [facility] for further evaluation.   HOSPITAL COURSE:  The patient ruled out for MI.  Underwent left heart  cardiac catheterization this morning revealing 20% proximal stenosis in  the LAD with minor luminal irregularities in the mid RCA.  EF was 55%  with normal wall motion.  We will plan to discharge him home this  afternoon and maintain him on aspirin and statin therapy.  He had been  given Nexium samples yesterday in the office and recommended that he  take 1 daily.   DISCHARGE LABS:  Hemoglobin 15, hematocrit 43.9, WBC 6.2, platelets 202.  INR 1.1, sodium 141, potassium 4, chloride 108, CO2 28, BUN 9,  creatinine 1.01, glucose 108.  LFTs were within normal limits.  Cardiac  markers negative x2.  TSH 1.517.  Total cholesterol 118,  triglycerides  92, HDL 26, LDL 74.   DISPOSITION:  The patient is being discharged home today in good  condition.   FOLLOWUP PLANS AND APPOINTMENTS:  The patient will followup with Dr.  Shirlee Latch on May 19 at 10 a.m.  The patient will follow up with Dr.  Hetty Ely as previously scheduled.   DISCHARGE MEDICATIONS:  1. Aspirin 81 mg daily.  2. Zocor 80 mg at bedtime.  3. Multivitamin daily.  4. Fish oil 1000 mg daily.  5. Nexium 40 mg daily.   OUTSTANDING LABORATORY STUDIES:  None.   DURATION OF DISCHARGE/ENCOUNTER:  Thirty five minutes including  physician time.      Nicolasa Ducking, ANP      Marca Ancona, MD  Electronically Signed    CB/MEDQ  D:  09/10/2008  T:  09/11/2008  Job:  [id number]   cc:   Arta Silence, MD

## 2010-09-22 NOTE — Cardiovascular Report (Signed)
Paul Fry, GALES NO.:  [id number]   MEDICAL RECORD NO.:  [id number]          PATIENT TYPE:  INP   LOCATION:  4732                         FACILITY:  [facility]   PHYSICIAN:  Marca Ancona, MD      DATE OF BIRTH:  04-12-1946   DATE OF PROCEDURE:  DATE OF DISCHARGE:                            CARDIAC CATHETERIZATION   PRIMARY CARE PHYSICIAN:  Arta Silence, MD   PROCEDURES:  1. Left heart catheterization.  2. Coronary angiography.  3. Left ventriculography.  4. Angio-Seal placement.   INDICATIONS:  Unstable angina.   PROCEDURE NOTE:  After informed consent was obtained, the right groin  was sterilely prepped and draped.  The right common femoral artery was  engaged using a Seldinger technique and a 6-French arterial sheath was  placed.  The left coronary artery and right coronary artery were engaged  using a 6-French multipurpose catheter.  The left ventricle was  introduced using a 6-French multipurpose catheter.  An Angio-Seal was  placed at the completion of the procedure.  There were no complications.   FINDINGS:  1. Hemodynamics:  LV 126/5/10, aorta 113/80/63.  2. Left ventriculography:  EF was 55%.  There were no regional wall      motion abnormalities.  3. Coronary angiography:  The coronary system was right dominant.      There were moderate luminal irregularities up to 30-40% in the mid      RCA.  No significant stenosis.  The left main was free from      significant disease.  The left circumflex artery was free from      significant disease.  There was about 20% stenosis in the proximal      LAD.  After this segment, the proximal LAD was ectatic.  There was      some dye hang-up in the proximal LAD.  However, there was excellent      TIMI 3 flow throughout the vessel.  There was no evidence for any      thrombus or dissection in that area of the LAD.   ASSESSMENT AND PLAN:  This is a 65 year old who presented with symptoms  consistent with  unstable angina.  There is no obstructive coronary  disease. There was hang up of dye in the ectatic proximal LAD.  However,  I think this is probably due to slow flow in the ectatic area of the  vessel.  There is normal TIMI 3 flow down the artery.  There is normal LV systolic function.  We plan to discharge the patient  today on Zocor 80 mg daily and aspirin 81 mg daily.  He is going to  follow up with me in 10-14 days.  We will get an echo as an outpatient  before his appointment.      Marca Ancona, MD  Electronically Signed     DM/MEDQ  D:  09/10/2008  T:  09/11/2008  Job:  [id number]   cc:   Arta Silence, MD

## 2010-09-22 NOTE — Discharge Summary (Signed)
[name], [name]:  [id number]   MEDICAL RECORD NO.:  [id number]          PATIENT TYPE:  INP   LOCATION:  1445                         [facility]ACILITY:  [facility]   PHYSICIAN:  [name], MD       DATE OF BIRTH:  Apr 08, 1946   DATE OF ADMISSION:  11/29/2008  DATE OF DISCHARGE:  12/04/2008                               DISCHARGE SUMMARY   DISCHARGE DIAGNOSES:  1. Severe lower gastrointestinal bleed, presumed secondary to      diverticulum.  2. Acute onset iron-deficiency anemia due to blood loss.   BRIEF HISTORY OF PRESENT ILLNESS:  This is a patient of Dr. Laurita Quint, who came to the emergency room after an acute onset of bright  red blood per rectum that had been going on for 1 day.  He was passing  bowel movements and says that there was blood in there.  He described it  as dark maroon blood.  There was some comment in the past of him having  diverticulosis, so this was felt to be due to a diverticular bleed.  His  admission labs showed a white blood cell count of 7.3, hemoglobin 15.2,  hematocrit 45.1, platelet count 242.  His pro-time was normal at 1.0.  His comprehensive metabolic panel showed a slight increase in glucose at  136, but was otherwise within normal limits.  He was typed and crossed,  but did not require a transfusion.  His cardiac enzymes and troponin  were normal.   HOSPITAL COURSE:  He was admitted and given IV fluids.  He was monitored  and continued to have stuttering episodes of lower GI bleeding.  His  hemoglobin and hematocrit drifted down.  On hospital day number 1, it  was down to 13.8 and 41.8.  His BMP continued to show a slight increase  in the glucose, but was otherwise normal.  His cardiac enzymes continued  to be negative.  On his second hospital day, his hemoglobin and  hematocrit drifted down to 12.3 and 36.1.  His lipid profile was  unremarkable.  On his third hospital day, his hemoglobin and hematocrit  drifted down  to 10.9 and 32.3.  He again did not require transfusion.  He was not symptomatic from the acute blood loss.  He states that he did  not have any chest pain or shortness of breath.  He was placed on  Protonix.  He continued to have stable vital signs throughout this  admission.  His physical examination was unremarkable.  Finally on the  day of discharge, he has not had any bleeding for the last 36 hours.  His vital signs remained stable.  His hemoglobin is up to 10.8 and  hematocrit 33.1.  He has been seen by Sondra Come and Dr. Marina Goodell of  Gastroenterology.  They feel pretty confident that this is a lower GI  bleed from diverticular disease and have not recommended any invasive  procedures such as a colonoscopy or nuclear medicine scan.  They have  asked that he avoid any anti-inflammatories or blood thinning agents.  Those have been  stopped and the patient has been advised not to continue  those.  Hemoglobin/hematocrit 10.8 and 33.1 respectively.   PHYSICAL EXAMINATION:  VITAL SIGNS on the day of discharge:  Temperature  of 98.3, pulse is 69, respiratory rate  20, blood pressure is 135/71.  He has 96% pulse ox on room air.  GENERAL:  He is alert and comfortable.  LUNGS:  Clear.  CARDIOVASCULAR:  Regular rhythm.  ABDOMEN:  Soft, nontender, nondistended.  EXTREMITIES:  Show no edema.   DISCHARGE MEDICATIONS:  1. Simvastatin.  2. Voltaren ointment.  3. His only new medicines will be Protonix 40 mg once a day and Feosol      325 mg twice a day with meals.   FOLLOW UP:  Discharge followup will be with Dr. Hetty Ely, primary care  physician within the next 1-2 weeks at which time he will have a CBC.   DISCHARGE INSTRUCTIONS:  1. He has been given a strong recommendation that he not take any      aspirin or anti-inflammatory type products which he agrees to.  2. He will return to the hospital immediately if he has any persistent      bleeding or develops any other new or different  symptoms.   Greater than 30 minutes was spent on this discharge summary.      [name], MD  Electronically Signed     TJ/MEDQ  D:  12/04/2008  T:  12/04/2008  Job:  [id number]   cc:   Arta Silence, MD  Fax: [phone]

## 2010-09-22 NOTE — Assessment & Plan Note (Signed)
[facility] OFFICE NOTE   NAME:Paul Fry, Paul Fry                     MRN:          [id number]  DATE:09/09/2008                            DOB:          [birth year]    PRIMARY CARE PHYSICIAN:  Dr. Laurita Quint.   HISTORY OF PRESENT ILLNESS:  This is a 65 year old with a history of  hyperlipidemia and a family history of premature coronary disease who  presents to cardiology clinic for evaluation of chest pressure and  tightness.  The patient says that 2 weeks ago he was mowing his grass at  home and developed chest tightness and pressure along with severe  shortness of breath.  He stopped working and his symptoms resolved in  about 5 minutes and he was able to go back to mowing his grass and had  no further problems.  Then last Friday he was pressure washing his house  and while he was pressure washing his house he developed substernal  chest tightness and pressure as well as shortness of breath.  The chest  pain rated an 8 out of 10 on a pain scale.  The patient had to stop  working and the pain resolved in about 5 minutes.  The patient went back  to working again and then he got the same type of symptoms again and  once again had to stop working with resolution of the pain.  The patient  did not finish pressure washing that day because of his chest pain  symptoms.  He went back to pressure washing again Saturday morning, once  again developed shortness of breath and chest pressure and tightness.  He stopped working and the pain decreased but did not completely  resolve.  He had a low level of chest pain over the weekend and  associated with the pain he had some tingling in the fingers of his left  hand.  Pain had mostly gone by today, however, while he was driving his  car this morning he developed some burning in his chest that did  resolved shortly.  The patient did see Dr. Hetty Ely this morning who was  concerned by his symptoms and did an EKG which was normal.  He sent him  here to see [place] this afternoon.  Patient currently is pain free.   PAST MEDICAL HISTORY:  1. Hyperlipidemia.  2. Diverticulosis.   SOCIAL HISTORY:  Patient lives with his wife in [place], he has no  children.  He does not smoke, rarely drinks alcohol.  He is retired.   FAMILY HISTORY:  Patient's sister has congestive heart failure, her  first myocardial infarction was when she was in her 38s.  She did have  bypass surgery.  His mother does not have known coronary disease, he  does not know his father.   MEDICATIONS:  1. Zocor 80 mg daily.  2. Aspirin 81 mg daily.  3. Multivitamin.  4. Fish oil 1000 mg daily.   Most recent labs from November 2009:  LDL 86, HDL 32, triglycerides 98.   EKG today shows normal sinus rhythm and  is a normal EKG.   REVIEW OF SYSTEMS  All systems were reviewed and were negative except as noted in the HPI.   ASSESSMENT AND PLAN:  This is a 65 year old with a history of  hyperlipidemia and family history of premature coronary disease who  presents for evaluation of chest tightness and shortness of breath with  exertion.  The patient's symptoms are quite concerning for unstable  angina, in fact he did have some prolonged chest pain over the weekend.  He had another episode this morning.  His EKG is normal.  He is  currently chest pain free.  We will give him aspirin 325 mg here today  in the office.  I am going to go ahead and directly admit him to [facility].  We will plan on doing a heart catheterization tomorrow morning.  We will rule him out for myocardial infarction when he goes into the  hospital this evening.     Marca Ancona, MD  Electronically Signed    DM/MedQ  DD: 09/09/2008  DT: 09/09/2008  Job #: [id number]   cc:   Arta Silence, MD

## 2010-09-22 NOTE — H&P (Signed)
NAMEJAMES, SENN NO.:  [id number]   MEDICAL RECORD NO.:  [id number]          PATIENT TYPE:  INP   LOCATION:  0111                         FACILITY[facility]  [facility]   PHYSICIAN:  Lucile Crater, MD         DATE OF BIRTH:  January 05, 1946   DATE OF ADMISSION:  11/29/2008  DATE OF DISCHARGE:                              HISTORY & PHYSICAL   PRIMARY CARE PHYSICIAN:  Arta Silence, M.D.   CHIEF COMPLAINT:  Bright red blood per rectum x1 day.   HISTORY OF PRESENT ILLNESS:  Mr. Arcidiacono is a 65 year old gentleman with  no significant past medical history other than hypercholesterolemia and  nonobstructive coronary artery disease.  He comes to the ER complaining  of bright red blood per rectum x1 day.  He states that when he has a  bowel movement, there is no stool in it, it is all dark blood.  He could  not say if he had melena proceeding this event.  He does have heartburn,  and he has been having it for the past 2 weeks.  There is a history of  diverticulosis mentioned in one of his medical records, but Mr. Clayson  says he does not know.  He had 3 colonoscopies done so far in his life,  but he is not sure what the reports are.  Of note, Mr. Ericksen has been  taking Meloxicam for pain in his feet, and he has been taking it for  more than 2 weeks.  He had a CBC done in the emergency room, which  showed a hemoglobin of 15.2.  The last known hemoglobin was 15 in May,  2010.  He denies having any chest pain or shortness of breath.  There is  no abdominal pain.  He denies any nausea, vomiting, or hematemesis.   REVIEW OF SYSTEMS:  A complete review of systems was done, which  included general, HEENT, cardiovascular, respiratory, GI, GU, endocrine,  musculoskeletal, skin, neurologic, psychiatric, and all were within  normal limits except for what was mentioned above.   PAST MEDICAL HISTORY:  1. Nonobstructive coronary artery disease with a cardiac cath done on      09/10/08.  2.  Hyperlipidemia.   ALLERGIES:  None.   CURRENT MEDICATIONS:  1. Zocor 80 mg p.o. nightly.  2. Aspirin 81 mg p.o. daily.  3. Meloxicam p.o. b.i.d.   SOCIAL HISTORY:  There is no history of tobacco or illicit drugs.  He  rarely consumes alcohol.  He currently lives with his wife.  He is  retired.   FAMILY HISTORY:  Premature coronary artery disease.   PHYSICAL EXAMINATION:  T max 97.9, blood pressure 139/95, pulse rate 66,  respirations 18, O2 sats 98% on room air.  GENERAL APPEARANCE:  Not in any acute distress.  Alert, awake, and  oriented x3.  Afebrile.  HEENT:  Normocephalic and atraumatic.  Pupils are equal, round and  reactive to light and accommodation.  Extraocular muscles are intact.  Mucous membranes are moist.  NECK:  Supple.  No JVD.  No lymphadenopathy.  No carotid  bruits.  CARDIOVASCULAR:  Regular rate and rhythm.  Rate is normal.  No murmurs,  rubs or gallops.  LUNGS:  Clear to auscultation bilaterally.  ABDOMEN:  Soft, nondistended, nontender.  No hepatosplenomegaly.  EXTREMITIES:  No clubbing, cyanosis or edema.  NEUROLOGIC:  Grossly nonfocal.  RECTAL:  Dark, maroon-colored blood, per ER MD.   LABS/STUDIES:  CBC with diff:  WBCs 7.3, hemoglobin 16, hematocrit 45,  platelets 242, normal differential.  BMP:  Sodium 140, potassium 3.8,  chloride 106, bicarb 28, BUN 13, creatinine 0.9, blood glucose 136.  Hepatic function tests within normal limits.   ASSESSMENT/PLAN:  1. Gastrointestinal bleed:  Unclear if it is upper or lower      gastrointestinal bleed.  He does have a history of diverticulosis.      He has a history of heartburn, and he is now on an nonsteroidal      with aspirin.  Both upper and lower gastrointestinal bleeds are a      possibility.  We will start the patient on IV proton pump      inhibitor.  We will type and cross 2 units of blood.  We will keep      monitoring his H and H every 6 hours.  If needed, will transfuse.      GI was already  called by the ER physician.  We are waiting to hear      back from them.  Most likely, he will need both upper and lower      endoscopies.  2. Hypercholesterolemia:  Will check a fasting lipid panel.  Will hold      off on the statin.  3. Fluids, electrolytes, nutrition:  Will start him on normal saline.      Will replace his electrolytes.  He will be made n.p.o.  4. Deep venous thrombosis prophylaxis with SCDs.   DISPOSITION:  Will admit patient to telemetry.  Will monitor his H and  H.  Awaiting GI consult.      Lucile Crater, MD  Electronically Signed     TA/MEDQ  D:  11/29/2008  T:  11/29/2008  Job:  [id number]

## 2010-10-26 ENCOUNTER — Encounter: Payer: Self-pay | Admitting: Cardiovascular Disease

## 2010-11-24 ENCOUNTER — Telehealth: Payer: Self-pay | Admitting: *Deleted

## 2010-11-24 NOTE — Telephone Encounter (Signed)
Prior Berkley Harvey is needed for simvastatin, form is on your desk.

## 2010-11-24 NOTE — Telephone Encounter (Signed)
Done

## 2010-11-25 ENCOUNTER — Ambulatory Visit (INDEPENDENT_AMBULATORY_CARE_PROVIDER_SITE_OTHER): Payer: Medicare Other | Admitting: Family Medicine

## 2010-11-25 ENCOUNTER — Encounter: Payer: Self-pay | Admitting: Family Medicine

## 2010-11-25 DIAGNOSIS — K219 Gastro-esophageal reflux disease without esophagitis: Secondary | ICD-10-CM

## 2010-11-25 DIAGNOSIS — I2510 Atherosclerotic heart disease of native coronary artery without angina pectoris: Secondary | ICD-10-CM

## 2010-11-25 MED ORDER — ATORVASTATIN CALCIUM 40 MG PO TABS: 40.0000 mg | ORAL_TABLET | Freq: Every day | ORAL | Status: DC

## 2010-11-25 NOTE — Progress Notes (Signed)
  Subjective:    Patient ID: Paul Fry, male    DOB: [birth year], 65 y.o.   MRN: [id number]  HPI Pt here because his Genella Rife medication "is not working." He is avoiding tomato products, spicy foods, etc. He is taking his Protonix at least before eating.  He tries to avoid trigger foods. His insurance company has told him he should not be on Simva 80. He has CAD and LDL on 80 was 88 when last checked.i.e. Therapeutic on current dose!   Review of SystemsHe has had no sxs on Simva 80 and feels well.     Objective:   Physical Exam  Constitutional: He appears well-developed and well-nourished. No distress.  HENT:  Head: Normocephalic and atraumatic.  Right Ear: External ear normal.  Left Ear: External ear normal.  Nose: Nose normal.  Mouth/Throat: Oropharynx is clear and moist.  Eyes: Conjunctivae and EOM are normal. Pupils are equal, round, and reactive to light. Right eye exhibits no discharge. Left eye exhibits no discharge.  Neck: Normal range of motion. Neck supple.  Cardiovascular: Normal rate and regular rhythm.   Pulmonary/Chest: Effort normal and breath sounds normal. He has no wheezes.  Abdominal: Soft. Bowel sounds are normal. He exhibits no distension and no mass. There is no tenderness. There is no rebound and no guarding.  Lymphadenopathy:    He has no cervical adenopathy.  Skin: He is not diaphoretic.          Assessment & Plan:

## 2010-11-25 NOTE — Assessment & Plan Note (Signed)
Chol great on Simva 80 without sxs. He has been stable on this. Do not think he needs to change, but will prescribe Lipitor 40 instead. See instructions. Script sent in.

## 2010-11-25 NOTE — Assessment & Plan Note (Signed)
Having sxs which start with brfst and sometimes last til noon. Insure taking Protonix 45 mins before eating.  For one bottle, take Maalox if has acute sxs. Avoid triggers (see instructions). If sxs still happening after one month, call and leave message and will refer to GI for eval prior to going to bid.

## 2010-11-25 NOTE — Patient Instructions (Addendum)
Take Protonix 45 mins prior to eating. GERD can be treated with medication but also with lifestyle changes including avoidance of late meals, excess alcohol, smoking cessation, avoiding spicy foods, chocolate, peppermint, colas, red wine and  acidic juices such as orange or tomato juice. Do not take mint or menthol products, use sugarless candy instead (Jolly Ranchers) Call me if sxs continue  Change Simva 80 to Lipitor 40 due to insurance interference. SGOT, SGPT 272.4    6 WEEKS See me in 3 mos, SGOT, SGPT, CHOL PROFILE  272.4     prior

## 2010-12-01 NOTE — Telephone Encounter (Signed)
Thank you :)

## 2010-12-01 NOTE — Telephone Encounter (Signed)
I called Paul Fry to inquire on auth status.  They said simvastatin was denied unless pt had been on it for over a year.  I told them that pt has been on this dose since at least 2008.  They then approved the auth through December of this year, then we will have to do it again.

## 2010-12-21 ENCOUNTER — Telehealth: Payer: Self-pay | Admitting: *Deleted

## 2010-12-21 MED ORDER — LANSOPRAZOLE 30 MG PO CPDR: 30.0000 mg | DELAYED_RELEASE_CAPSULE | Freq: Every day | ORAL | Status: DC

## 2010-12-21 NOTE — Telephone Encounter (Signed)
Have him try Prevacid instead of Protonix. Insure taking 45 mins prior to first meal of day and avoid trigger foods. GERD can be treated with medication but also with lifestyle changes including avoidance of late meals, excess alcohol, smoking cessation, avoiding spicy foods, chocolate, peppermint, colas, red wine and  acidic juices such as orange or tomato juice. Do not take mint or menthol products, use sugarless candy instead (Jolly Ranchers)

## 2010-12-21 NOTE — Telephone Encounter (Signed)
Patient notified as instructed by telephone. Patient requested a copy of these instructions. Copy of this note printed and left up front for patient to pick up.

## 2010-12-21 NOTE — Telephone Encounter (Signed)
Patient says that the acid reflux has gotten worse since he was last here. He is asking if he can get rx to try different med or if he could get referral to GI. Please advise.

## 2011-02-18 ENCOUNTER — Other Ambulatory Visit: Payer: Self-pay | Admitting: Family Medicine

## 2011-02-18 ENCOUNTER — Telehealth: Payer: Self-pay | Admitting: *Deleted

## 2011-02-18 DIAGNOSIS — E7800 Pure hypercholesterolemia, unspecified: Secondary | ICD-10-CM

## 2011-02-18 NOTE — Telephone Encounter (Signed)
Patient notified as instructed by telephone and follow-up appointment was scheduled.

## 2011-02-18 NOTE — Telephone Encounter (Signed)
Message copied by Binnie Kand on Thu Feb 18, 2011  2:24 PM ------      Message from: Elliott      Created: Thu Feb 18, 2011  1:56 PM       Please have pt seen after labs to discuss results.      ----- Message -----         From: Mills Koller         Sent: 02/18/2011   9:26 AM           To: Laurita Quint, MD            Labs no f/u appt

## 2011-02-23 ENCOUNTER — Other Ambulatory Visit (INDEPENDENT_AMBULATORY_CARE_PROVIDER_SITE_OTHER): Payer: Medicare Other

## 2011-02-23 DIAGNOSIS — E7800 Pure hypercholesterolemia, unspecified: Secondary | ICD-10-CM

## 2011-02-24 LAB — HEPATIC FUNCTION PANEL
Albumin: 4 g/dL (ref 3.5–5.2)
Total Bilirubin: 0.5 mg/dL (ref 0.3–1.2)

## 2011-02-24 LAB — LIPID PANEL
HDL: 36.1 mg/dL — ABNORMAL LOW (ref >39.00)
LDL Cholesterol: 72 mg/dL (ref 0–99)
Total CHOL/HDL Ratio: 4
Triglycerides: 115 mg/dL (ref 0.0–149.0)

## 2011-03-03 ENCOUNTER — Telehealth: Payer: Self-pay | Admitting: *Deleted

## 2011-03-03 ENCOUNTER — Encounter: Payer: Self-pay | Admitting: Family Medicine

## 2011-03-03 ENCOUNTER — Ambulatory Visit (INDEPENDENT_AMBULATORY_CARE_PROVIDER_SITE_OTHER): Payer: Medicare Other | Admitting: Family Medicine

## 2011-03-03 DIAGNOSIS — E7800 Pure hypercholesterolemia, unspecified: Secondary | ICD-10-CM | POA: Insufficient documentation

## 2011-03-03 NOTE — Assessment & Plan Note (Signed)
Nos great today Need to know what med he is taking now. Lipitor 40 or Simva 80. Insurance had wanted him off 80mg  of Simva. Cont curr medication and let me know what it is. If Simva, he has been on it a long time without complications.

## 2011-03-03 NOTE — Telephone Encounter (Signed)
Pt came in and dropped off information on the prescriptions he is taking- simvastatin 80 mg, one tablet by mouth at bedtime.  Lansoprazole DR 30 mg's capsule, one by mouth every day.

## 2011-03-03 NOTE — Patient Instructions (Signed)
Call with chol med. Schedule new PE w/ Dr Para March.

## 2011-03-03 NOTE — Progress Notes (Signed)
  Subjective:    Patient ID: Paul Fry, male    DOB: [birth year], 65 y.o.   MRN: [id number]  HPI Pt here for chol recheck. He had done well on Simva 80 for years but his insurance co wanted him off of it due to risk on Amlodipine and 80. We switched it to Lipitor 40. He now says he thinks the insur co switched him back to 80mg  which he thinks is Simva !! He will let me know.    Review of SystemsNoncontributory except as above.       Objective:   Physical Exam  Constitutional: He appears well-developed and well-nourished. No distress.  HENT:  Head: Normocephalic and atraumatic.  Right Ear: External ear normal.  Left Ear: External ear normal.  Nose: Nose normal.  Mouth/Throat: Oropharynx is clear and moist.  Eyes: Conjunctivae and EOM are normal. Pupils are equal, round, and reactive to light. Right eye exhibits no discharge. Left eye exhibits no discharge.  Neck: Normal range of motion. Neck supple.  Cardiovascular: Normal rate and regular rhythm.   Pulmonary/Chest: Effort normal and breath sounds normal. He has no wheezes.  Lymphadenopathy:    He has no cervical adenopathy.  Skin: He is not diaphoretic.          Assessment & Plan:

## 2011-03-04 NOTE — Telephone Encounter (Signed)
Called [facility] and was informed that they did receive the script that was sent in 3 months ago for Lipitor.

## 2011-03-04 NOTE — Telephone Encounter (Signed)
Will have pt continue with Simva as is tolerating well without complaint.

## 2011-03-04 NOTE — Telephone Encounter (Signed)
Please call the pharmacy and ask if a script for Lipitor 40 was ever received from the visit 3 months ago in place of his Simvastatin 80 that he is still taking.

## 2011-05-11 ENCOUNTER — Other Ambulatory Visit: Payer: Self-pay | Admitting: Family Medicine

## 2011-05-11 DIAGNOSIS — Z125 Encounter for screening for malignant neoplasm of prostate: Secondary | ICD-10-CM

## 2011-05-11 DIAGNOSIS — E7800 Pure hypercholesterolemia, unspecified: Secondary | ICD-10-CM

## 2011-05-18 ENCOUNTER — Other Ambulatory Visit (INDEPENDENT_AMBULATORY_CARE_PROVIDER_SITE_OTHER): Payer: Medicare Other

## 2011-05-18 DIAGNOSIS — Z125 Encounter for screening for malignant neoplasm of prostate: Secondary | ICD-10-CM | POA: Diagnosis not present

## 2011-05-18 DIAGNOSIS — E7800 Pure hypercholesterolemia, unspecified: Secondary | ICD-10-CM

## 2011-05-18 LAB — COMPREHENSIVE METABOLIC PANEL
Albumin: 4 g/dL (ref 3.5–5.2)
Alkaline Phosphatase: 59 U/L (ref 39–117)
BUN: 13 mg/dL (ref 6–23)
Creatinine, Ser: 0.9 mg/dL (ref 0.4–1.5)
Glucose, Bld: 116 mg/dL — ABNORMAL HIGH (ref 70–99)
Total Bilirubin: 1.1 mg/dL (ref 0.3–1.2)

## 2011-05-18 LAB — LIPID PANEL
Cholesterol: 152 mg/dL (ref 0–200)
HDL: 38.3 mg/dL — ABNORMAL LOW (ref >39.00)
LDL Cholesterol: 97 mg/dL (ref 0–99)
VLDL: 16.6 mg/dL (ref 0.0–40.0)

## 2011-05-19 LAB — PSA, MEDICARE: PSA: 0.8 ng/mL (ref <=4.00)

## 2011-05-25 ENCOUNTER — Encounter: Payer: Self-pay | Admitting: Family Medicine

## 2011-05-25 ENCOUNTER — Telehealth: Payer: Self-pay | Admitting: *Deleted

## 2011-05-25 ENCOUNTER — Encounter: Payer: Self-pay | Admitting: Gastroenterology

## 2011-05-25 ENCOUNTER — Ambulatory Visit (INDEPENDENT_AMBULATORY_CARE_PROVIDER_SITE_OTHER): Payer: Medicare Other | Admitting: Family Medicine

## 2011-05-25 VITALS — BP 122/70 | HR 71 | Temp 98.1°F | Ht 72.0 in | Wt 215.8 lb

## 2011-05-25 DIAGNOSIS — E7800 Pure hypercholesterolemia, unspecified: Secondary | ICD-10-CM

## 2011-05-25 DIAGNOSIS — M722 Plantar fascial fibromatosis: Secondary | ICD-10-CM

## 2011-05-25 DIAGNOSIS — R739 Hyperglycemia, unspecified: Secondary | ICD-10-CM

## 2011-05-25 DIAGNOSIS — K219 Gastro-esophageal reflux disease without esophagitis: Secondary | ICD-10-CM | POA: Diagnosis not present

## 2011-05-25 DIAGNOSIS — Z23 Encounter for immunization: Secondary | ICD-10-CM

## 2011-05-25 DIAGNOSIS — Z0000 Encounter for general adult medical examination without abnormal findings: Secondary | ICD-10-CM

## 2011-05-25 DIAGNOSIS — K5790 Diverticulosis of intestine, part unspecified, without perforation or abscess without bleeding: Secondary | ICD-10-CM

## 2011-05-25 MED ORDER — LANSOPRAZOLE 30 MG PO CPDR: 30.0000 mg | DELAYED_RELEASE_CAPSULE | Freq: Two times a day (BID) | ORAL | Status: DC

## 2011-05-25 MED ORDER — SIMVASTATIN 40 MG PO TABS: 40.0000 mg | ORAL_TABLET | Freq: Every day | ORAL | Status: DC

## 2011-05-25 MED ORDER — DICLOFENAC SODIUM 1 % TD GEL: 1.0000 "application " | Freq: Two times a day (BID) | TRANSDERMAL | Status: DC

## 2011-05-25 NOTE — Progress Notes (Signed)
Welcome to medicare exam.   I have personally reviewed the Medicare Annual Wellness questionnaire and have noted 1. The patient's medical and social history 2. Their use of alcohol, tobacco or illicit drugs 3. Their current medications and supplements 4. The patient's functional ability including ADL's, fall risks, home safety risks and hearing or visual             impairment. 5. Diet and physical activities 6. Evidence for depression or mood disorders  The patients weight, height, BMI have been recorded in the chart and visual acuity is per eye clinic.  No vision changes per patient.  I have made referrals, counseling and provided education to the patient based review of the above and I have provided the pt with a written personalized care plan for preventive services.  Elevated Cholesterol: Using medications without problems:yes Muscle aches: no Diet compliance:yes Exercise:yes Labs d/w pt.   We talked about zocor 80mg .  Plan to cut back the simva to 40mg  a day and recheck sugar/lipids in a few months.     H/o GERD.  Occ gets sob, worse with exertion, worse in AM.  Prev with cardiac cath done.  No wheeze.  Asking about options.  No CP. Not sob now.    Plantar fasciitis improved with voltaren gel and stretching. Needs refill.   PMH and SH reviewed  Meds, vitals, and allergies reviewed.   ROS: See HPI.  Otherwise negative.    GEN: nad, alert and oriented HEENT: mucous membranes moist NECK: supple w/o LA CV: rrr. PULM: ctab, no inc wob ABD: soft, +bs EXT: no edema SKIN: no acute rash Prostate gland firm and smooth, no enlargement, nodularity, tenderness, mass, asymmetry or induration.

## 2011-05-25 NOTE — Patient Instructions (Signed)
Check with your insurance to see if they will cover the shingles shot. Get a pneumonia shot in 4 years. I would get a flu shot each fall.   See Shirlee Limerick about your referral before you leave today. Cut out sweets and come back for fasting labs in 3 months. Take the lansoprazole twice a day and elevated the head of your bed.  If you aren't improving and less shortwinded in the morning, let me know.  Cut the cholesterol medicine back to 40mg  a day.

## 2011-05-25 NOTE — Telephone Encounter (Signed)
Patient's Lansoprazole is $58 on insurance.  Patient is asking if we can try something different - ?Omeprazole?

## 2011-05-26 ENCOUNTER — Encounter: Payer: Self-pay | Admitting: Family Medicine

## 2011-05-26 DIAGNOSIS — Z0000 Encounter for general adult medical examination without abnormal findings: Secondary | ICD-10-CM | POA: Insufficient documentation

## 2011-05-26 DIAGNOSIS — M722 Plantar fascial fibromatosis: Secondary | ICD-10-CM | POA: Insufficient documentation

## 2011-05-26 MED ORDER — OMEPRAZOLE 20 MG PO CPDR: 20.0000 mg | DELAYED_RELEASE_CAPSULE | Freq: Two times a day (BID) | ORAL | Status: DC

## 2011-05-26 NOTE — Assessment & Plan Note (Signed)
May explain the AM sx.  Elevated head of bed, bid PPI, call back if not improved.  Normal CV/pulm exam today.  Consider PFTs if not improved.  He agrees.

## 2011-05-26 NOTE — Assessment & Plan Note (Signed)
Plan to cut back the simva to 40mg  a day and recheck sugar/lipids in a few months.  He agrees. Work on diet in meantime.

## 2011-05-26 NOTE — Telephone Encounter (Signed)
Patient notified by telephone that rx has been sent to the pharmacy. 

## 2011-05-26 NOTE — Assessment & Plan Note (Signed)
See scanned forms.  Colon up to date.  PSA okay, DRE wnl.  AD d/w pt, step daughter is MD and is POA per patient.  Work on diet and exercise.  Flu shot today at exam.  PNA vaccine in 4 years, as recently given.  D/w pt about zostavax.  Healthy habits encouraged.

## 2011-05-26 NOTE — Telephone Encounter (Signed)
Change to omeprazole 20mg  po bid.  rx sent.

## 2011-06-01 ENCOUNTER — Telehealth: Payer: Self-pay | Admitting: *Deleted

## 2011-06-01 NOTE — Telephone Encounter (Signed)
This was the incorrect number to call.  I was sent to [phone].  The representative here says the Zostavax does not need a PA.  It is covered and the patient just needs to go to the pharmacy to receive it.  Patient advised.

## 2011-06-01 NOTE — Telephone Encounter (Signed)
Call an get the form, put it on my desk.  Thanks.

## 2011-06-01 NOTE — Telephone Encounter (Signed)
Patient called stating that he called Medicare about a shingles shot and they told him to call his other insurance company regarding this. Patient states that he talked with someone at [facility] and he was told that the doctor needs to call them to get a form for him to complete for a prior authorization on this. Telephone number is [phone] and ID # [id number].

## 2011-06-07 ENCOUNTER — Ambulatory Visit (INDEPENDENT_AMBULATORY_CARE_PROVIDER_SITE_OTHER): Payer: Medicare Other | Admitting: Gastroenterology

## 2011-06-07 ENCOUNTER — Encounter: Payer: Self-pay | Admitting: Gastroenterology

## 2011-06-07 ENCOUNTER — Telehealth: Payer: Self-pay | Admitting: Family Medicine

## 2011-06-07 VITALS — BP 122/72 | HR 72 | Ht 72.0 in | Wt 217.6 lb

## 2011-06-07 DIAGNOSIS — Z800 Family history of malignant neoplasm of digestive organs: Secondary | ICD-10-CM | POA: Diagnosis not present

## 2011-06-07 DIAGNOSIS — K219 Gastro-esophageal reflux disease without esophagitis: Secondary | ICD-10-CM | POA: Diagnosis not present

## 2011-06-07 DIAGNOSIS — R0602 Shortness of breath: Secondary | ICD-10-CM | POA: Diagnosis not present

## 2011-06-07 MED ORDER — ZOSTER VACCINE LIVE 19400 UNT/0.65ML ~~LOC~~ SOLR: 0.6500 mL | Freq: Once | SUBCUTANEOUS | Status: DC

## 2011-06-07 NOTE — Telephone Encounter (Signed)
Sent to [facility] [address].  Please notify pt.

## 2011-06-07 NOTE — Telephone Encounter (Signed)
Patient advised.

## 2011-06-07 NOTE — Telephone Encounter (Signed)
Patient needs rx for shingle vaccine for the pharmancy. Please call patient when ready.

## 2011-06-07 NOTE — Patient Instructions (Signed)
You have been scheduled for a Upper Endoscopy. See separate instructions.  Patient advised to avoid spicy, acidic, citrus, chocolate, mints, fruit and fruit juices.  Limit the intake of caffeine, alcohol and Soda.  Don't exercise too soon after eating.  Don't lie down within 3-4 hours of eating.  Elevate the head of your bed. cc: Crawford Givens, MD

## 2011-06-07 NOTE — Progress Notes (Signed)
History of Present Illness: This is a  66 year old male with worsening reflux symptoms. He relates frequent nausea and heartburn in the morning sometimes upon awakening and sometimes following meals. He also has shortness of breath with exercise particularly in the morning. States he was diagnosed with GERD and 2010 and has been maintained on lansoprazole 30 mg daily. He was recently switched to omeprazole 20 mg twice daily. He does not note any change in symptoms. Denies weight loss, abdominal pain, constipation, diarrhea, change in stool caliber, melena, hematochezia, vomiting, dysphagia, chest pain.   Review of Systems: Pertinent positive and negative review of systems were noted in the above HPI section. All other review of systems were otherwise negative.  Current Medications, Allergies, Past Medical History, Past Surgical History, Family History and Social History were reviewed in [employer] record.  Physical Exam: General: Well developed , well nourished, no acute distress Head: Normocephalic and atraumatic Eyes:  sclerae anicteric, EOMI Ears: Normal auditory acuity Mouth: No deformity or lesions Neck: Supple, no masses or thyromegaly Lungs: Clear throughout to auscultation Heart: Regular rate and rhythm; no murmurs, rubs or bruits Abdomen: Soft, non tender and non distended. No masses, hepatosplenomegaly or hernias noted. Normal Bowel sounds Musculoskeletal: Symmetrical with no gross deformities  Skin: No lesions on visible extremities Pulses:  Normal pulses noted Extremities: No clubbing, cyanosis, edema or deformities noted Neurological: Alert oriented x 4, grossly nonfocal Cervical Nodes:  No significant cervical adenopathy Inguinal Nodes: No significant inguinal adenopathy Psychological:  Alert and cooperative. Normal mood and affect  Assessment and Recommendations:  1. GERD. Intensify antireflux measures. Continue omeprazole 20 mg twice daily for now.  The risks, benefits, and alternatives to endoscopy with possible biopsy and possible dilation were discussed with the patient and they consent to proceed.   2. Family history of colon cancer in his mother. Two siblings with colon polyps. Elevated risk screening colonoscopy recommended June 2014.  3. Shortness of breath, dyspnea on exertion. This does not appear to be related to GERD. Further evaluation with Dr. Para March.

## 2011-06-08 ENCOUNTER — Other Ambulatory Visit: Payer: Self-pay | Admitting: *Deleted

## 2011-06-08 MED ORDER — ZOSTER VACCINE LIVE 19400 UNT/0.65ML ~~LOC~~ SOLR: 0.6500 mL | Freq: Once | SUBCUTANEOUS | Status: AC

## 2011-06-08 NOTE — Telephone Encounter (Signed)
Rx for shingles vaccine was sent to [facility] and patient was advised that they do not administer the shingles vaccine and they do not transfer the Rx for that particular Rx.  Rx sent to [facility].

## 2011-06-11 ENCOUNTER — Telehealth: Payer: Self-pay | Admitting: Family Medicine

## 2011-06-11 NOTE — Telephone Encounter (Signed)
Are you speaking of spirometry here in this office or PFT's through  Pulmonary?

## 2011-06-11 NOTE — Telephone Encounter (Signed)
Appt scheduled 06/17/2011 for spirometry.

## 2011-06-11 NOTE — Telephone Encounter (Signed)
Please call pt.  If he continues to have SOB in AM, then set up visit for PFTs.  This visit doesn't have to happen on a day when I am here- I am fine with RN visit if this is feasible with the schedule.  Dx SOB.  Thanks.

## 2011-06-11 NOTE — Telephone Encounter (Signed)
Spirometry here.

## 2011-06-16 ENCOUNTER — Ambulatory Visit (AMBULATORY_SURGERY_CENTER): Payer: Medicare Other | Admitting: Gastroenterology

## 2011-06-16 ENCOUNTER — Encounter: Payer: Self-pay | Admitting: Gastroenterology

## 2011-06-16 VITALS — BP 127/72 | HR 61 | Temp 97.8°F | Resp 12 | Ht 72.0 in | Wt 217.0 lb

## 2011-06-16 DIAGNOSIS — K219 Gastro-esophageal reflux disease without esophagitis: Secondary | ICD-10-CM

## 2011-06-16 MED ORDER — OMEPRAZOLE 40 MG PO CPDR: 40.0000 mg | DELAYED_RELEASE_CAPSULE | Freq: Two times a day (BID) | ORAL | Status: DC

## 2011-06-16 MED ORDER — SODIUM CHLORIDE 0.9 % IV SOLN: 500.0000 mL | INTRAVENOUS | Status: DC

## 2011-06-16 NOTE — Patient Instructions (Signed)
Discharge instructions given with verbal understanding. Handouts on a hiatal hernia and a soft diet given. Resume previous medications. 

## 2011-06-16 NOTE — Progress Notes (Signed)
Patient did not experience any of the following events: a burn prior to discharge; a fall within the facility; wrong site/side/patient/procedure/implant event; or a hospital transfer or hospital admission upon discharge from the facility. (G8907) Patient did not have preoperative order for IV antibiotic SSI prophylaxis. (G8918)  

## 2011-06-16 NOTE — Op Note (Signed)
[facility] [address] [place], [place]  [address]  ENDOSCOPY PROCEDURE REPORT  PATIENT:  Paul Fry, Paul Fry  MR#:  [id number] BIRTHDATE:  [birth year], 65 yrs. old  GENDER:  male ENDOSCOPIST:  Judie Petit T. Russella Dar, MD, [facility]  PROCEDURE DATE:  06/16/2011 PROCEDURE:  EGD, diagnostic 43235 ASA CLASS:  Class II INDICATIONS:  GERD MEDICATIONS:   These medications were titrated to patient response per physician's verbal order, Fentanyl 50 mcg IV, Versed 5 mg IV TOPICAL ANESTHETIC:  Cetacaine Spray DESCRIPTION OF PROCEDURE:   After the risks benefits and alternatives of the procedure were thoroughly explained, informed consent was obtained.  The LB [id number]-H180 [id number] endoscope was introduced through the mouth and advanced to the second portion of the duodenum, without limitations.  The instrument was slowly withdrawn as the mucosa was fully examined. <<PROCEDUREIMAGES>> The esophagus and gastroesophageal junction were completely normal in appearance.  The stomach was entered and closely examined. The pylorus, antrum, angularis, and lesser curvature were well visualized, including a retroflexed view of the cardia and fundus. The stomach wall was normally distensable. The scope passed easily through the pylorus into the duodenum.  The duodenal bulb was normal in appearance, as was the postbulbar duodenum.  Retroflexed views revealed a hiatal hernia, small.  The scope was then withdrawn from the patient and the procedure completed.  COMPLICATIONS:  None  ENDOSCOPIC IMPRESSION: 1) Small hiatal hernia  RECOMMENDATIONS: 1) Anti-reflux regimen 2) PPI bid: omeprazole 40 mg po bid, #60, 11 refills  Melodye Swor T. Russella Dar, MD, [facility]  n. eSIGNED:   Venita Lick. Gabriele Loveland at 06/16/2011 10:01 AM  Melanee Left, [phone]

## 2011-06-17 ENCOUNTER — Ambulatory Visit (INDEPENDENT_AMBULATORY_CARE_PROVIDER_SITE_OTHER): Payer: Medicare Other | Admitting: Family Medicine

## 2011-06-17 ENCOUNTER — Other Ambulatory Visit: Payer: Self-pay | Admitting: *Deleted

## 2011-06-17 ENCOUNTER — Telehealth: Payer: Self-pay | Admitting: *Deleted

## 2011-06-17 VITALS — Ht 72.0 in | Wt 217.0 lb

## 2011-06-17 DIAGNOSIS — R0602 Shortness of breath: Secondary | ICD-10-CM | POA: Diagnosis not present

## 2011-06-17 NOTE — Telephone Encounter (Signed)
  Follow up Call-  Call back number 06/16/2011  Post procedure Call Back phone  # [phone]  Permission to leave phone message Yes     Patient questions:  Do you have a fever, pain , or abdominal swelling? no Pain Score  0 *  Have you tolerated food without any problems? yes  Have you been able to return to your normal activities? yes  Do you have any questions about your discharge instructions: Diet   no Medications  no Follow up visit  no  Do you have questions or concerns about your Care? no  Actions: * If pain score is 4 or above: No action needed, pain <4.

## 2011-06-17 NOTE — Telephone Encounter (Signed)
Faxed request for Zostava

## 2011-06-18 ENCOUNTER — Telehealth: Payer: Self-pay | Admitting: Family Medicine

## 2011-06-18 NOTE — Telephone Encounter (Signed)
Please call pt.  Tx for GERD didn't help the SOB, and PFTs are normal.  I'd like him to see cards, prev saw Dr. Shirlee Latch. I don't know if treadmill testing would be useful, but I'd like cards input.  Referral ordered.  Thanks.

## 2011-06-18 NOTE — Telephone Encounter (Signed)
Patient advised.  Shirlee Limerick has already made the appt.

## 2011-06-18 NOTE — Progress Notes (Signed)
  Subjective:    Patient ID: Paul Fry, male    DOB: [birth year], 66 y.o.   MRN: [id number]  HPI    Review of Systems     Objective:   Physical Exam        Assessment & Plan:  See notes on spirometry

## 2011-06-21 ENCOUNTER — Encounter: Payer: Self-pay | Admitting: Family Medicine

## 2011-07-12 ENCOUNTER — Ambulatory Visit: Payer: Medicare Other | Admitting: Cardiovascular Disease

## 2011-07-16 ENCOUNTER — Ambulatory Visit (INDEPENDENT_AMBULATORY_CARE_PROVIDER_SITE_OTHER): Payer: Medicare Other | Admitting: Cardiology

## 2011-07-16 ENCOUNTER — Encounter: Payer: Self-pay | Admitting: Cardiology

## 2011-07-16 DIAGNOSIS — R0609 Other forms of dyspnea: Secondary | ICD-10-CM | POA: Diagnosis not present

## 2011-07-16 DIAGNOSIS — E7800 Pure hypercholesterolemia, unspecified: Secondary | ICD-10-CM | POA: Diagnosis not present

## 2011-07-16 DIAGNOSIS — R0989 Other specified symptoms and signs involving the circulatory and respiratory systems: Secondary | ICD-10-CM | POA: Diagnosis not present

## 2011-07-16 DIAGNOSIS — R079 Chest pain, unspecified: Secondary | ICD-10-CM | POA: Diagnosis not present

## 2011-07-16 NOTE — Patient Instructions (Signed)
Need to have a Myoview for chest pain. Follow up in one year with Dr. Shirlee Latch in [place] if the Myoview looks normal.

## 2011-07-18 NOTE — Assessment & Plan Note (Signed)
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  Patient reports increasing episodes of atypical chest pain.  This certainly could be GERD.  - Continue bid PPI - ETT-myoview

## 2011-07-18 NOTE — Progress Notes (Signed)
PCP: Dr. Para March  66 yo with history of GERD presents for evaluation of chest pain.  He actually had a cath in 5/10 with mild nonobstructive CAD.  He has been having episodes of burning in his central chest.  This happens daily.  It is not related to meals or exertion.  There is no particular trigger. The pain will last for a few minutes.  Sometimes he will taste acid in his mouth.  He also has chronic exertional dyspnea with heavier activities like walking up a hill.  This has been chronic.  He has never smoked.  He is on simvastatin.   ECG: NSR, normal  Labs (1/13): K 4.3, creatinine 0.9, LDL 97, HDL 38  PMH: 1. GERD 2. Plantar fasciitis 3. Hyperlipidemia 4. Diverticulosis with diverticular bleeding.  Stopped ASA. 5. Left heart cath 5/10: EF 55%, 30-40% mid RCA.   FH: No premature CAD.  SH: Nonsmoker, lives in [place]  ROS: All systems reviewed and negative except as per HPI.   Current Outpatient Prescriptions  Medication Sig Dispense Refill  . ascorbic acid (VITAMIN C) 500 MG tablet Take 500 mg by mouth daily.        . Calcium Carbonate (CALCIUM 600 PO) Take by mouth 2 (two) times daily.        . calcium carbonate (TUMS) 500 MG chewable tablet Chew 1 tablet by mouth as needed.        . diclofenac sodium (VOLTAREN) 1 % GEL Apply 1 application topically 2 (two) times daily.  100 g  3  . fish oil-omega-3 fatty acids 1000 MG capsule Take 1 g by mouth daily.      . Multiple Vitamins-Minerals (CENTRUM SILVER PO) Take by mouth daily.        [name] omeprazole (PRILOSEC) 40 MG capsule Take 1 capsule (40 mg total) by mouth 2 (two) times daily.  60 capsule  11  . simvastatin (ZOCOR) 40 MG tablet Take 1 tablet (40 mg total) by mouth at bedtime.  90 tablet  3    BP 128/78  Pulse 73  Ht 6' (1.829 m)  Wt 211 lb (95.709 kg)  BMI 28.62 kg/m2  SpO2 95% General: NAD Neck: No JVD, no thyromegaly or thyroid nodule.  Lungs: Clear to auscultation bilaterally with normal respiratory effort. CV:  Nondisplaced PMI.  Heart regular S1/S2, +S4, no murmur.  No peripheral edema.  No carotid bruit.  Normal pedal pulses.  Abdomen: Soft, nontender, no hepatosplenomegaly, no distention.  Skin: Intact without lesions or rashes.  Neurologic: Alert and oriented x 3.  Psych: Normal affect. Extremities: No clubbing or cyanosis.  HEENT: Normal.

## 2011-07-18 NOTE — Assessment & Plan Note (Signed)
Good lipids when checked in 1/13.

## 2011-07-29 ENCOUNTER — Ambulatory Visit (HOSPITAL_COMMUNITY): Payer: Medicare Other | Attending: Cardiovascular Disease | Admitting: Radiology

## 2011-07-29 VITALS — BP 147/67 | Ht 72.0 in | Wt 208.0 lb

## 2011-07-29 DIAGNOSIS — R0989 Other specified symptoms and signs involving the circulatory and respiratory systems: Secondary | ICD-10-CM | POA: Insufficient documentation

## 2011-07-29 DIAGNOSIS — R079 Chest pain, unspecified: Secondary | ICD-10-CM | POA: Diagnosis not present

## 2011-07-29 DIAGNOSIS — I2510 Atherosclerotic heart disease of native coronary artery without angina pectoris: Secondary | ICD-10-CM | POA: Diagnosis not present

## 2011-07-29 DIAGNOSIS — E785 Hyperlipidemia, unspecified: Secondary | ICD-10-CM | POA: Diagnosis not present

## 2011-07-29 DIAGNOSIS — R0609 Other forms of dyspnea: Secondary | ICD-10-CM | POA: Diagnosis not present

## 2011-07-29 DIAGNOSIS — R0602 Shortness of breath: Secondary | ICD-10-CM

## 2011-07-29 DIAGNOSIS — I4949 Other premature depolarization: Secondary | ICD-10-CM

## 2011-07-29 HISTORY — PX: CARDIOVASCULAR STRESS TEST: SHX262

## 2011-07-29 MED ORDER — REGADENOSON 0.4 MG/5ML IV SOLN
0.4000 mg | Freq: Once | INTRAVENOUS | Status: AC
  Administered 2011-07-29: 0.4 mg via INTRAVENOUS

## 2011-07-29 MED ORDER — TECHNETIUM TC 99M TETROFOSMIN IV KIT
10.0000 | PACK | Freq: Once | INTRAVENOUS | Status: AC | PRN
  Administered 2011-07-29: 10 via INTRAVENOUS

## 2011-07-29 MED ORDER — TECHNETIUM TC 99M TETROFOSMIN IV KIT
30.0000 | PACK | Freq: Once | INTRAVENOUS | Status: AC | PRN
  Administered 2011-07-29: 30 via INTRAVENOUS

## 2011-07-29 NOTE — Progress Notes (Addendum)
[facility] SITE 3 NUCLEAR MED [address] [place] [address] [phone]  Cardiology Nuclear Med Study  Paul Fry is a 66 y.o. male     MRN : [id number]     DOB: 09-13-1945  Procedure Date: 07/29/2011  Nuclear Med Background Indication for Stress Test:  Evaluation for Ischemia History:  '10 Cath:Mild n/o CAD, EF=30-40%. Cardiac Risk Factors: Lipids  Symptoms:  Chest Pain with and without Exertion (last episode of chest discomfort was about one week ago), DOE and SOB   Nuclear Pre-Procedure Caffeine/Decaff Intake:  None NPO After: 9:30pm   Lungs:  clear O2 Sat: 98% on room air. IV 0.9% NS with Angio Cath:  20g  IV Site: R Antecubital  IV Started by:  Bonnita Levan, RN  Chest Size (in):  46 Cup Size: n/a  Height: 6' (1.829 m)  Weight:  208 lb (94.348 kg)  BMI:  Body mass index is 28.21 kg/(m^2). Tech Comments:  N/A    Nuclear Med Study 1 or 2 day study: 1 day  Stress Test Type:  Lexiscan  Reading MD: Charlton Haws, MD  Order Authorizing Provider:  Marca Ancona, MD  Resting Radionuclide: Technetium 66m Tetrofosmin  Resting Radionuclide Dose: 11.0 mCi   Stress Radionuclide:  Technetium 32m Tetrofosmin  Stress Radionuclide Dose: 33.0 mCi           Stress Protocol Rest HR: 64 Stress HR: 108  Rest BP: 147/67 Stress BP: 165/77  Exercise Time (min): 2:00 METS: n/a   Predicted Max HR: 155 bpm % Max HR: 69.68 bpm Rate Pressure Product: 21308   Dose of Adenosine (mg):  n/a Dose of Lexiscan: 0.4 mg  Dose of Atropine (mg): n/a Dose of Dobutamine: n/a mcg/kg/min (at max HR)  Stress Test Technologist: Smiley Houseman, CMA-N  Nuclear Technologist:  Doyne Keel, CNMT     Rest Procedure:  Myocardial perfusion imaging was performed at rest 45 minutes following the intravenous administration of Technetium 55m Tetrofosmin.  Rest ECG: No acute changes and occasional PVC.  Stress Procedure:  The patient received IV Lexiscan 0.4 mg over 15-seconds with  concurrent low level exercise and then Technetium 61m Tetrofosmin was injected at 30-seconds while the patient continued walking one more minute. There were no significant changes with Lexiscan bolus. Quantitative spect images were obtained after a 45-minute delay.  Stress ECG: No significant change from baseline ECG  QPS Raw Data Images:  Normal; no motion artifact; normal heart/lung ratio. Stress Images:  Normal homogeneous uptake in all areas of the myocardium. Rest Images:  Normal homogeneous uptake in all areas of the myocardium. Subtraction (SDS):  Normal Transient Ischemic Dilatation (Normal <1.22):  1.11 Lung/Heart Ratio (Normal <0.45):  0.25  Quantitative Gated Spect Images QGS EDV:  136 ml QGS ESV:  72 ml  Impression Exercise Capacity:  Lexiscan with low level exercise. BP Response:  Normal blood pressure response. Clinical Symptoms:  No significant symptoms noted. ECG Impression:  No significant ST segment change suggestive of ischemia. Comparison with Prior Nuclear Study: No previous nuclear study performed  Overall Impression:  Low risk stress nuclear study. No ischemia or infarct .  EF mildly depressed.  Consider MRI/Echo correlation if clinically indicated  LV Ejection Fraction: 47%.  LV Wall Motion:  Apical hypokinesis mild decrease in EF 47%   Charlton Haws  No ischemia or infarction but EF is low.  Need to confirm this by echo.   Dalton [employer]

## 2011-08-02 NOTE — Progress Notes (Signed)
Discussed with pt. He agreed to have echo.

## 2011-08-02 NOTE — Progress Notes (Signed)
Addended by: Jacqlyn Krauss on: 08/02/2011 04:04 PM   Modules accepted: Orders

## 2011-08-05 ENCOUNTER — Ambulatory Visit (HOSPITAL_COMMUNITY): Payer: Medicare Other | Attending: Cardiovascular Disease

## 2011-08-05 ENCOUNTER — Other Ambulatory Visit: Payer: Self-pay

## 2011-08-05 DIAGNOSIS — R0989 Other specified symptoms and signs involving the circulatory and respiratory systems: Secondary | ICD-10-CM | POA: Insufficient documentation

## 2011-08-05 DIAGNOSIS — R0609 Other forms of dyspnea: Secondary | ICD-10-CM | POA: Insufficient documentation

## 2011-08-05 DIAGNOSIS — R079 Chest pain, unspecified: Secondary | ICD-10-CM | POA: Insufficient documentation

## 2011-08-05 DIAGNOSIS — E785 Hyperlipidemia, unspecified: Secondary | ICD-10-CM | POA: Diagnosis not present

## 2011-08-05 DIAGNOSIS — I080 Rheumatic disorders of both mitral and aortic valves: Secondary | ICD-10-CM | POA: Insufficient documentation

## 2011-08-05 DIAGNOSIS — R072 Precordial pain: Secondary | ICD-10-CM | POA: Diagnosis not present

## 2011-08-11 ENCOUNTER — Other Ambulatory Visit: Payer: Self-pay | Admitting: *Deleted

## 2011-08-30 ENCOUNTER — Ambulatory Visit: Payer: Medicare Other | Admitting: Family Medicine

## 2011-09-01 ENCOUNTER — Ambulatory Visit: Payer: Medicare Other | Admitting: Family Medicine

## 2011-09-06 ENCOUNTER — Encounter: Payer: Self-pay | Admitting: Family Medicine

## 2011-09-06 ENCOUNTER — Ambulatory Visit (INDEPENDENT_AMBULATORY_CARE_PROVIDER_SITE_OTHER): Payer: Medicare Other | Admitting: Family Medicine

## 2011-09-06 VITALS — BP 112/70 | HR 82 | Temp 98.2°F | Wt 213.0 lb

## 2011-09-06 DIAGNOSIS — R0609 Other forms of dyspnea: Secondary | ICD-10-CM | POA: Diagnosis not present

## 2011-09-06 DIAGNOSIS — K449 Diaphragmatic hernia without obstruction or gangrene: Secondary | ICD-10-CM | POA: Diagnosis not present

## 2011-09-06 DIAGNOSIS — R0989 Other specified symptoms and signs involving the circulatory and respiratory systems: Secondary | ICD-10-CM | POA: Diagnosis not present

## 2011-09-06 NOTE — Patient Instructions (Signed)
You have a hiatal hernia.  Keep taking the stomach pills and stay off the coffee.  Your heart and lungs tests were fine.

## 2011-09-06 NOTE — Progress Notes (Signed)
SOB is much better, but not totally resolved.  Off coffee.  On PPI BID now.  Known HH on EGD.  No FCNAVD.  Had echo, stress test and EGD along with spirometry.    Meds, vitals, and allergies reviewed.   ROS: See HPI.  Otherwise, noncontributory.  nad ncat Mmm Op wnl Neck supple rrr Ctab abd soft, not ttp

## 2011-09-07 ENCOUNTER — Encounter: Payer: Self-pay | Admitting: Family Medicine

## 2011-09-07 DIAGNOSIS — H35379 Puckering of macula, unspecified eye: Secondary | ICD-10-CM | POA: Diagnosis not present

## 2011-09-07 DIAGNOSIS — H43399 Other vitreous opacities, unspecified eye: Secondary | ICD-10-CM | POA: Diagnosis not present

## 2011-09-07 DIAGNOSIS — H11159 Pinguecula, unspecified eye: Secondary | ICD-10-CM | POA: Diagnosis not present

## 2011-09-07 DIAGNOSIS — H2510 Age-related nuclear cataract, unspecified eye: Secondary | ICD-10-CM | POA: Diagnosis not present

## 2011-09-07 NOTE — Assessment & Plan Note (Addendum)
With normal spirometry and dec in sx after treatment for [facility] and stopping trigger foods (coffee).  Anatomy d/w pt.  Given improvement, no further eval.  F/u prn.

## 2011-09-09 ENCOUNTER — Other Ambulatory Visit (INDEPENDENT_AMBULATORY_CARE_PROVIDER_SITE_OTHER): Payer: Medicare Other

## 2011-09-09 DIAGNOSIS — E7800 Pure hypercholesterolemia, unspecified: Secondary | ICD-10-CM | POA: Diagnosis not present

## 2011-09-09 DIAGNOSIS — R7309 Other abnormal glucose: Secondary | ICD-10-CM | POA: Diagnosis not present

## 2011-09-09 DIAGNOSIS — R739 Hyperglycemia, unspecified: Secondary | ICD-10-CM

## 2011-09-09 LAB — GLUCOSE, RANDOM: Glucose, Bld: 118 mg/dL — ABNORMAL HIGH (ref 70–99)

## 2011-09-09 LAB — LIPID PANEL
HDL: 36.9 mg/dL — ABNORMAL LOW (ref >39.00)
LDL Cholesterol: 82 mg/dL (ref 0–99)
Total CHOL/HDL Ratio: 4
Triglycerides: 116 mg/dL (ref 0.0–149.0)

## 2011-09-10 ENCOUNTER — Encounter: Payer: Self-pay | Admitting: *Deleted

## 2012-03-31 ENCOUNTER — Telehealth: Payer: Self-pay

## 2012-03-31 DIAGNOSIS — K219 Gastro-esophageal reflux disease without esophagitis: Secondary | ICD-10-CM

## 2012-03-31 MED ORDER — OMEPRAZOLE 20 MG PO CPDR: 20.0000 mg | DELAYED_RELEASE_CAPSULE | Freq: Two times a day (BID) | ORAL | Status: DC

## 2012-03-31 NOTE — Telephone Encounter (Signed)
If he's doing well with the 20mg  bid, I would continue that.  Thanks.

## 2012-03-31 NOTE — Telephone Encounter (Signed)
Pt needs refill on acid reflux med; pt said Dr Russella Dar gave pt Omeprazole 40 mg one bid (on med list). Pt said Dr Para March changed to Omeprazole 20 mg taking one bid (on historical med list). Pt said 20 mg twice a day works well. Pt needs refill of one of the Omeprazoles to [facility] [address] (see phone note 05/25/11).Please advise.

## 2012-05-17 ENCOUNTER — Other Ambulatory Visit: Payer: Self-pay | Admitting: Family Medicine

## 2012-05-17 DIAGNOSIS — I2510 Atherosclerotic heart disease of native coronary artery without angina pectoris: Secondary | ICD-10-CM

## 2012-05-17 DIAGNOSIS — Z125 Encounter for screening for malignant neoplasm of prostate: Secondary | ICD-10-CM

## 2012-05-18 ENCOUNTER — Other Ambulatory Visit (INDEPENDENT_AMBULATORY_CARE_PROVIDER_SITE_OTHER): Payer: Medicare Other

## 2012-05-18 DIAGNOSIS — Z125 Encounter for screening for malignant neoplasm of prostate: Secondary | ICD-10-CM | POA: Diagnosis not present

## 2012-05-18 DIAGNOSIS — I2510 Atherosclerotic heart disease of native coronary artery without angina pectoris: Secondary | ICD-10-CM

## 2012-05-18 LAB — LIPID PANEL
Cholesterol: 161 mg/dL (ref 0–200)
HDL: 33 mg/dL — ABNORMAL LOW
LDL Cholesterol: 102 mg/dL — ABNORMAL HIGH (ref 0–99)
Total CHOL/HDL Ratio: 5
Triglycerides: 132 mg/dL (ref 0.0–149.0)
VLDL: 26.4 mg/dL (ref 0.0–40.0)

## 2012-05-18 LAB — PSA, MEDICARE: PSA: 0.85 ng/mL (ref 0.10–4.00)

## 2012-05-18 LAB — COMPREHENSIVE METABOLIC PANEL WITH GFR
ALT: 26 U/L (ref 0–53)
AST: 26 U/L (ref 0–37)
Albumin: 3.7 g/dL (ref 3.5–5.2)
Alkaline Phosphatase: 61 U/L (ref 39–117)
BUN: 11 mg/dL (ref 6–23)
CO2: 30 meq/L (ref 19–32)
Calcium: 9.2 mg/dL (ref 8.4–10.5)
Chloride: 101 meq/L (ref 96–112)
Creatinine, Ser: 1 mg/dL (ref 0.4–1.5)
GFR: 92.78 mL/min
Glucose, Bld: 104 mg/dL — ABNORMAL HIGH (ref 70–99)
Potassium: 4.4 meq/L (ref 3.5–5.1)
Sodium: 138 meq/L (ref 135–145)
Total Bilirubin: 1 mg/dL (ref 0.3–1.2)
Total Protein: 7 g/dL (ref 6.0–8.3)

## 2012-05-25 ENCOUNTER — Encounter: Payer: Self-pay | Admitting: Family Medicine

## 2012-05-25 ENCOUNTER — Ambulatory Visit (INDEPENDENT_AMBULATORY_CARE_PROVIDER_SITE_OTHER): Payer: Medicare Other | Admitting: Family Medicine

## 2012-05-25 VITALS — BP 122/72 | HR 77 | Temp 98.3°F | Ht 72.0 in | Wt 209.0 lb

## 2012-05-25 DIAGNOSIS — N529 Male erectile dysfunction, unspecified: Secondary | ICD-10-CM

## 2012-05-25 DIAGNOSIS — Z23 Encounter for immunization: Secondary | ICD-10-CM | POA: Diagnosis not present

## 2012-05-25 DIAGNOSIS — E7800 Pure hypercholesterolemia, unspecified: Secondary | ICD-10-CM | POA: Diagnosis not present

## 2012-05-25 DIAGNOSIS — K219 Gastro-esophageal reflux disease without esophagitis: Secondary | ICD-10-CM

## 2012-05-25 DIAGNOSIS — Z0000 Encounter for general adult medical examination without abnormal findings: Secondary | ICD-10-CM | POA: Diagnosis not present

## 2012-05-25 MED ORDER — SILDENAFIL CITRATE 100 MG PO TABS: 50.0000 mg | ORAL_TABLET | Freq: Every day | ORAL | Status: DC | PRN

## 2012-05-25 MED ORDER — SIMVASTATIN 40 MG PO TABS: 40.0000 mg | ORAL_TABLET | Freq: Every day | ORAL | Status: DC

## 2012-05-25 MED ORDER — OMEPRAZOLE 20 MG PO CPDR: 20.0000 mg | DELAYED_RELEASE_CAPSULE | Freq: Two times a day (BID) | ORAL | Status: DC

## 2012-05-25 NOTE — Progress Notes (Signed)
I have personally reviewed the Medicare Annual Wellness questionnaire and have noted 1. The patient's medical and social history 2. Their use of alcohol, tobacco or illicit drugs 3. Their current medications and supplements 4. The patient's functional ability including ADL's, fall risks, home safety risks and hearing or visual             impairment. 5. Diet and physical activities 6. Evidence for depression or mood disorders  The patients weight, height, BMI have been recorded in the chart and visual acuity is per eye clinic.  I have made referrals, counseling and provided education to the patient based review of the above and I have provided the pt with a written personalized care plan for preventive services.  See scanned forms.  Routine anticipatory guidance given to patient.  See health maintenance. Flu today Shingles 2013 PNA 2011 Tetanus 2010 Colonoscopy due later in 2014 Prostate cancer screening- PSA wnl Advance directive d/w pt.  Wife designated if incapacitated.  Cognitive function addressed- see scanned forms- and if abnormal then additional documentation follows.   Elevated Cholesterol: Using medications without problems:yes Muscle aches: yes Diet compliance: except for over the holidays Exercise: walking  Dec in libido. Dec in erection strength over the last year.  Interested in viagra.  D/w pt about options.    PMH and SH reviewed  Meds, vitals, and allergies reviewed.   ROS: See HPI.  Otherwise negative.    GEN: nad, alert and oriented HEENT: mucous membranes moist NECK: supple w/o LA CV: rrr. PULM: ctab, no inc wob ABD: soft, +bs EXT: no edema SKIN: no acute rash

## 2012-05-25 NOTE — Patient Instructions (Addendum)
Call GI in 6/14 if they don't call you first. Use the viagra is needed.  Keep exercising.  Take care.  Glad to see you.

## 2012-05-26 DIAGNOSIS — N529 Male erectile dysfunction, unspecified: Secondary | ICD-10-CM | POA: Insufficient documentation

## 2012-05-26 NOTE — Assessment & Plan Note (Signed)
See scanned forms.  Routine anticipatory guidance given to patient.  See health maintenance. Flu today Shingles 2013 PNA 2011 Tetanus 2010 Colonoscopy due later in 2014 Prostate cancer screening- PSA wnl Advance directive d/w pt.  Wife designated if incapacitated.  Cognitive function addressed- see scanned forms- and if abnormal then additional documentation follows.

## 2012-05-26 NOTE — Assessment & Plan Note (Signed)
Will add on viagra and f/u prn.  He agrees.

## 2012-05-26 NOTE — Assessment & Plan Note (Signed)
Still controlled with GERD treated.

## 2012-05-26 NOTE — Assessment & Plan Note (Signed)
Continue statin.  Labs d/w pt.  

## 2012-07-25 ENCOUNTER — Telehealth: Payer: Self-pay | Admitting: *Deleted

## 2012-07-25 DIAGNOSIS — I351 Nonrheumatic aortic (valve) insufficiency: Secondary | ICD-10-CM

## 2012-07-25 DIAGNOSIS — I340 Nonrheumatic mitral (valve) insufficiency: Secondary | ICD-10-CM

## 2012-07-25 NOTE — Telephone Encounter (Signed)
SPOKE WITH PT .PT   IS DUE FOR CARDIAC MRI  WILL FORWARD  STAFF MESSAGE TO LELA TO SCHEDULE./CY

## 2012-08-07 ENCOUNTER — Ambulatory Visit (HOSPITAL_COMMUNITY)
Admission: RE | Admit: 2012-08-07 | Discharge: 2012-08-07 | Disposition: A | Discharge status: Home / Self Care | Payer: Medicare Other | Source: Ambulatory Visit | Attending: Cardiovascular Disease | Admitting: Cardiovascular Disease

## 2012-08-07 ENCOUNTER — Other Ambulatory Visit: Payer: Self-pay | Admitting: *Deleted

## 2012-08-07 ENCOUNTER — Telehealth: Payer: Self-pay | Admitting: Cardiovascular Disease

## 2012-08-07 DIAGNOSIS — I340 Nonrheumatic mitral (valve) insufficiency: Secondary | ICD-10-CM

## 2012-08-07 DIAGNOSIS — I428 Other cardiomyopathies: Secondary | ICD-10-CM | POA: Insufficient documentation

## 2012-08-07 DIAGNOSIS — I517 Cardiomegaly: Secondary | ICD-10-CM | POA: Diagnosis not present

## 2012-08-07 DIAGNOSIS — I351 Nonrheumatic aortic (valve) insufficiency: Secondary | ICD-10-CM

## 2012-08-07 DIAGNOSIS — Z79899 Other long term (current) drug therapy: Secondary | ICD-10-CM

## 2012-08-07 LAB — BASIC METABOLIC PANEL
CO2: 21 mEq/L (ref 19–32)
Chloride: 102 mEq/L (ref 96–112)
Creatinine, Ser: 0.89 mg/dL (ref 0.50–1.35)

## 2012-08-07 MED ORDER — GADOBENATE DIMEGLUMINE 529 MG/ML IV SOLN
35.0000 mL | Freq: Once | INTRAVENOUS | Status: AC
  Administered 2012-08-07: 30 mL via INTRAVENOUS

## 2012-08-07 NOTE — Telephone Encounter (Signed)
New problem    Paul Fry/radiology stated pt is in the office to have MR of the heart w/contrast but doesn't understand why he's having this done-Paul Fry would like for you to give him a call @ [phone] to clarify w/patient why he's having MR

## 2012-08-07 NOTE — Telephone Encounter (Signed)
SPOKE WITH PT. PT  IS AWARE OF RECOMMENDATIONS  NOTED ON LAST ECHO REPORT REQUESTED  COPY OF ECHO  PT AWARE WILL SEND  TO PT  VIA MAIL .Zack Seal

## 2012-08-08 ENCOUNTER — Other Ambulatory Visit: Payer: Self-pay | Admitting: *Deleted

## 2012-08-08 DIAGNOSIS — Z79899 Other long term (current) drug therapy: Secondary | ICD-10-CM

## 2012-08-08 MED ORDER — LOSARTAN POTASSIUM 25 MG PO TABS: 25.0000 mg | ORAL_TABLET | Freq: Every day | ORAL | Status: DC

## 2012-08-10 ENCOUNTER — Telehealth: Payer: Self-pay

## 2012-08-10 NOTE — Telephone Encounter (Signed)
Wendall with Release point request status or record release. [employer] picked up on 08/08/12; need to allow 7-10 business days.

## 2012-08-22 ENCOUNTER — Telehealth: Payer: Self-pay | Admitting: Family Medicine

## 2012-08-22 NOTE — Telephone Encounter (Signed)
Patient advised.

## 2012-08-22 NOTE — Telephone Encounter (Signed)
Caller: Kainalu/Patient; Phone: [phone]; Reason for Call: Medication question.  Pt has increased gas since starting Omeprazole.  Pt would like to know if he can stop Omeprazole for a couple of days to see if that is what is causing the gas.  Pt denies Chest Pain or SOB. Per Health Education in [facility] and www.  Drugs.  Com, Gas is not a common side effect.  Triage offered.  PLEASE REVIEW W/ MD AND F/U W/ PT IF OK TO HOLD OMEPRAZOLE FOR COUPLE OF DAYS.

## 2012-08-22 NOTE — Telephone Encounter (Signed)
I would stop it for a few days and see if that helped.  Thanks.

## 2012-08-23 DIAGNOSIS — H25019 Cortical age-related cataract, unspecified eye: Secondary | ICD-10-CM | POA: Diagnosis not present

## 2012-08-23 DIAGNOSIS — H00029 Hordeolum internum unspecified eye, unspecified eyelid: Secondary | ICD-10-CM | POA: Diagnosis not present

## 2012-09-14 ENCOUNTER — Encounter: Payer: Self-pay | Admitting: *Deleted

## 2012-09-20 ENCOUNTER — Telehealth: Payer: Self-pay

## 2012-09-20 NOTE — Telephone Encounter (Signed)
Stacy with [employer] Point left v/m requesting status of med record release sent 09/18/12 ref # [id number]. Left v/m advising sent to [employer] on 09/19/12.

## 2012-09-25 ENCOUNTER — Other Ambulatory Visit (INDEPENDENT_AMBULATORY_CARE_PROVIDER_SITE_OTHER): Payer: Medicare Other

## 2012-09-25 ENCOUNTER — Ambulatory Visit (INDEPENDENT_AMBULATORY_CARE_PROVIDER_SITE_OTHER): Payer: Medicare Other | Admitting: Cardiology

## 2012-09-25 ENCOUNTER — Encounter: Payer: Self-pay | Admitting: Cardiology

## 2012-09-25 VITALS — BP 122/72 | HR 63 | Ht 72.0 in | Wt 209.0 lb

## 2012-09-25 DIAGNOSIS — R0609 Other forms of dyspnea: Secondary | ICD-10-CM

## 2012-09-25 DIAGNOSIS — E7800 Pure hypercholesterolemia, unspecified: Secondary | ICD-10-CM | POA: Diagnosis not present

## 2012-09-25 DIAGNOSIS — I429 Cardiomyopathy, unspecified: Secondary | ICD-10-CM

## 2012-09-25 DIAGNOSIS — I428 Other cardiomyopathies: Secondary | ICD-10-CM | POA: Diagnosis not present

## 2012-09-25 DIAGNOSIS — R0989 Other specified symptoms and signs involving the circulatory and respiratory systems: Secondary | ICD-10-CM | POA: Diagnosis not present

## 2012-09-25 DIAGNOSIS — Z79899 Other long term (current) drug therapy: Secondary | ICD-10-CM

## 2012-09-25 LAB — BASIC METABOLIC PANEL
CO2: 28 mEq/L (ref 19–32)
Calcium: 8.9 mg/dL (ref 8.4–10.5)
Glucose, Bld: 119 mg/dL — ABNORMAL HIGH (ref 70–99)
Potassium: 4.2 mEq/L (ref 3.5–5.1)
Sodium: 139 mEq/L (ref 135–145)

## 2012-09-25 MED ORDER — CARVEDILOL 3.125 MG PO TABS: 3.1250 mg | ORAL_TABLET | Freq: Two times a day (BID) | ORAL | Status: DC

## 2012-09-25 MED ORDER — ATORVASTATIN CALCIUM 40 MG PO TABS: 40.0000 mg | ORAL_TABLET | Freq: Every day | ORAL | Status: DC

## 2012-09-25 NOTE — Addendum Note (Signed)
Addended by: Baldomero Lamy on: 09/25/2012 09:05 AM   Modules accepted: Orders

## 2012-09-25 NOTE — Progress Notes (Signed)
Patient ID: Paul Fry, male   DOB: Jan 26, 1946, 67 y.o.   MRN: [id number] PCP: Dr. 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  He had a cath in 5/10 with mild nonobstructive CAD.  He had atypical chest pain in 3/13 and ETT-myoview was done, showing EF 47%, no ischemia or infarction.  Given the abnormal EF, he had an echo done which showed EF 50-55% with mild LVH.  He has had no complaints recently other than GERD-type symptoms after eating certain foods.  No exertional chest pain or dyspnea.  No tachypalpitations.  Cardiac MR was done in 4/14, showing EF 49% with distal septal and apical hypokinesis, no delayed enhancement.    ECG: NSR, normal  Labs (1/13): K 4.3, creatinine 0.9, LDL 97, HDL 38 Labs (1/14): LDL 102, HDL 33 Labs (5/14): K 4.2, creatinine 0.5  PMH: 1. GERD 2. Plantar fasciitis 3. Hyperlipidemia 4. Diverticulosis with diverticular bleeding x 2.  Stopped ASA. 5. Left heart cath 5/10: EF 55%, 30-40% mid RCA.  6. Cardiomyopathy: Mild.  ETT-Myoview (3/13) with EF 47%, no ischemia or infarction.  Echo (3/13) with EF 50-55%, mild LVH, mild MR, mild AI.  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    FH: No premature CAD.  SH: Nonsmoker, lives in [place]  ROS: All systems reviewed and negative except as per HPI.   Current Outpatient Prescriptions  Medication Sig Dispense Refill  . ascorbic acid (VITAMIN C) 500 MG tablet Take 500 mg by mouth daily.        . Calcium Carbonate (CALCIUM 600 PO) Take by mouth 2 (two) times daily.        . calcium carbonate (TUMS) 500 MG chewable tablet Chew 1 tablet by mouth as needed.        . fish oil-omega-3 fatty acids 1000 MG capsule Take 1 g by mouth daily.      [name] losartan (COZAAR) 25 MG tablet Take 1 tablet (25 mg total) by mouth daily.  30 tablet  11  . Multiple Vitamins-Minerals (CENTRUM SILVER  PO) Take by mouth daily.        [name] omeprazole (PRILOSEC) 20 MG capsule Take 1 capsule (20 mg total) by mouth 2 (two) times daily.  180 capsule  3  . sildenafil (VIAGRA) 100 MG tablet Take 0.5-1 tablets (50-100 mg total) by mouth daily as needed for erectile dysfunction.  10 tablet  12  . atorvastatin (LIPITOR) 40 MG tablet Take 1 tablet (40 mg total) by mouth daily.  30 tablet  3  . carvedilol (COREG) 3.125 MG tablet Take 1 tablet (3.125 mg total) by mouth 2 (two) times daily.  60 tablet  11   No current facility-administered medications for this visit.    BP 122/72  Pulse 63  Ht 6' (1.829 m)  Wt 209 lb (94.802 kg)  BMI 28.34 kg/m2  SpO2 98% General: NAD Neck: No JVD, no thyromegaly or thyroid nodule.  Lungs: Clear to auscultation bilaterally with normal respiratory effort. CV: Nondisplaced PMI.  Heart regular S1/S2, +S4, no murmur.  No peripheral edema.  No carotid bruit.  Normal pedal pulses.  Abdomen: Soft, nontender, no hepatosplenomegaly, no distention.  Neurologic: Alert and oriented x 3.  Psych: Normal affect. Extremities: No clubbing or cyanosis.   Assessment/Plan:  1. Cardiomyopathy: NYHA class I. Mild cardiomyopathy, EF 49% on cardiac MRI.  No delayed enhancement on  MRI.  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  This may be the cause of his mild cardiomyopathy.  - Continue losartan. - Add Coreg 3.125 mg bid. - Echo in 1 year at followup.  2. Hyperlipidemia: LDL higher than ideal.  Stop simvastatin, start atorvastatin 40 mg daily with lipids/LFTs in 2 months.   Marca Ancona 09/26/2012

## 2012-09-25 NOTE — Patient Instructions (Addendum)
Stop simvastatin.   Start atorvastatin (lipitor) 40mg  daily.  Start coreg(carvedilol) 3.125mg  two times a day.   Your physician recommends that you return for a FASTING lipid profile /liver profile in 2 months.   Your physician wants you to follow-up in: 1 year with Dr Shirlee Latch. (May 2015).You will receive a reminder letter in the mail two months in advance. If you don't receive a letter, please call our office to schedule the follow-up appointment.   Your physician has requested that you have an echocardiogram. Echocardiography is a painless test that uses sound waves to create images of your heart. It provides your doctor with information about the size and shape of your heart and how well your heart's chambers and valves are working. This procedure takes approximately one hour. There are no restrictions for this procedure. May 2015

## 2012-09-26 ENCOUNTER — Encounter: Payer: Self-pay | Admitting: *Deleted

## 2012-09-26 DIAGNOSIS — I428 Other cardiomyopathies: Secondary | ICD-10-CM | POA: Insufficient documentation

## 2012-10-06 ENCOUNTER — Encounter: Payer: Self-pay | Admitting: Gastroenterology

## 2012-10-09 ENCOUNTER — Encounter: Payer: Self-pay | Admitting: Gastroenterology

## 2012-10-17 ENCOUNTER — Telehealth: Payer: Self-pay

## 2012-10-17 NOTE — Telephone Encounter (Signed)
Release point left v/m requesting missed information on medical records received; ref # [id number]; need stress mayo view and EGD; advised Natasha via v/m to contact Dr Shirlee Latch office phone [phone] or fax [phone].

## 2012-10-19 ENCOUNTER — Telehealth: Payer: Self-pay | Admitting: Cardiology

## 2012-10-19 NOTE — Telephone Encounter (Signed)
Pt Signed ROI, Picked up copy Of Stress,EKG  10/19/12/KM

## 2012-12-11 ENCOUNTER — Ambulatory Visit (AMBULATORY_SURGERY_CENTER): Payer: Medicare Other | Admitting: *Deleted

## 2012-12-11 VITALS — Ht 72.0 in | Wt 209.4 lb

## 2012-12-11 DIAGNOSIS — Z800 Family history of malignant neoplasm of digestive organs: Secondary | ICD-10-CM

## 2012-12-11 MED ORDER — MOVIPREP 100 G PO SOLR: ORAL | Status: DC

## 2012-12-11 NOTE — Progress Notes (Signed)
No allergies to eggs or soy. No problems with anesthesia.  

## 2012-12-13 ENCOUNTER — Other Ambulatory Visit: Payer: Self-pay

## 2012-12-21 ENCOUNTER — Ambulatory Visit (AMBULATORY_SURGERY_CENTER): Payer: Medicare Other | Admitting: Gastroenterology

## 2012-12-21 ENCOUNTER — Encounter: Payer: Self-pay | Admitting: Gastroenterology

## 2012-12-21 VITALS — BP 127/67 | HR 61 | Temp 97.3°F | Resp 24 | Ht 72.0 in | Wt 209.0 lb

## 2012-12-21 DIAGNOSIS — I428 Other cardiomyopathies: Secondary | ICD-10-CM | POA: Diagnosis not present

## 2012-12-21 DIAGNOSIS — Z8371 Family history of colonic polyps: Secondary | ICD-10-CM | POA: Diagnosis not present

## 2012-12-21 DIAGNOSIS — D126 Benign neoplasm of colon, unspecified: Secondary | ICD-10-CM

## 2012-12-21 DIAGNOSIS — I2510 Atherosclerotic heart disease of native coronary artery without angina pectoris: Secondary | ICD-10-CM | POA: Diagnosis not present

## 2012-12-21 DIAGNOSIS — Z800 Family history of malignant neoplasm of digestive organs: Secondary | ICD-10-CM | POA: Diagnosis not present

## 2012-12-21 DIAGNOSIS — K219 Gastro-esophageal reflux disease without esophagitis: Secondary | ICD-10-CM | POA: Diagnosis not present

## 2012-12-21 MED ORDER — SODIUM CHLORIDE 0.9 % IV SOLN: 500.0000 mL | INTRAVENOUS | Status: DC

## 2012-12-21 NOTE — Progress Notes (Signed)
Called to room to assist during endoscopic procedure.  Patient ID and intended procedure confirmed with present staff. Received instructions for my participation in the procedure from the performing physician.  

## 2012-12-21 NOTE — Op Note (Signed)
[facility] [address] [place], [address]   COLONOSCOPY PROCEDURE REPORT  PATIENT: Paul Fry, Paul Fry  MR#: [id number] BIRTHDATE: [birth year] , 67  yrs. old GENDER: Male ENDOSCOPIST: Meryl Dare, MD, [facility] PROCEDURE DATE:  12/21/2012 PROCEDURE:   Colonoscopy with snare polypectomy First Screening Colonoscopy - Avg.  risk and is 50 yrs.  old or older - No.  Prior Negative Screening - Now for repeat screening. Above average risk  History of Adenoma - Now for follow-up colonoscopy & has been > or = to 3 yrs.  N/A  Polyps Removed Today? Yes. ASA CLASS:   Class II INDICATIONS:Patient's immediate family history of colon cancer and Patient's family history of colon polyps. MEDICATIONS: MAC sedation, administered by CRNA and propofol (Diprivan) 250mg  IV DESCRIPTION OF PROCEDURE:   After the risks benefits and alternatives of the procedure were thoroughly explained, informed consent was obtained.  A digital rectal exam revealed no abnormalities of the rectum.   The LB [id number]  endoscope was introduced through the anus and advanced to the cecum, which was identified by both the appendix and ileocecal valve. No adverse events experienced.   The quality of the prep was adequate, using MoviPrep  The instrument was then slowly withdrawn as the colon was fully examined.  COLON FINDINGS: A sessile polyp measuring 6 mm in size was found in the transverse colon.  A polypectomy was performed with a cold snare.  The resection was complete and the polyp tissue was completely retrieved.   Moderate diverticulosis was noted in the sigmoid colon and descending colon.   The colon was otherwise normal.  There was no diverticulosis, inflammation, polyps or cancers unless previously stated.     The time to cecum=2 minutes 40 seconds.  Withdrawal time=11 minutes 20 seconds.  The scope was withdrawn and the procedure completed.  COMPLICATIONS: There were no  complications.  ENDOSCOPIC IMPRESSION: 1.   Sessile polyp measuring 6 mm in the transverse colon; polypectomy performed with a cold snare 2.   Moderate diverticulosis was noted in the sigmoid colon and descending colon  RECOMMENDATIONS: 1.  Await pathology results 2.  High fiber diet with liberal fluid intake. 3.  Repeat Colonoscopy in 5 years.   eSigned:  Meryl Dare, MD, [facility] 12/21/2012 10:46 AM

## 2012-12-21 NOTE — Patient Instructions (Addendum)
YOU HAD AN ENDOSCOPIC PROCEDURE TODAY AT THE [facility]: Refer to the procedure report that was given to you for any specific questions about what was found during the examination.  If the procedure report does not answer your questions, please call your gastroenterologist to clarify.  If you requested that your care partner not be given the details of your procedure findings, then the procedure report has been included in a sealed envelope for you to review at your convenience later.  YOU SHOULD EXPECT: Some feelings of bloating in the abdomen. Passage of more gas than usual.  Walking can help get rid of the air that was put into your GI tract during the procedure and reduce the bloating. If you had a lower endoscopy (such as a colonoscopy or flexible sigmoidoscopy) you may notice spotting of blood in your stool or on the toilet paper. If you underwent a bowel prep for your procedure, then you may not have a normal bowel movement for a few days.  DIET: Your first meal following the procedure should be a light meal and then it is ok to progress to your normal diet.  A half-sandwich or bowl of soup is an example of a good first meal.  Heavy or fried foods are harder to digest and may make you feel nauseous or bloated.  Likewise meals heavy in dairy and vegetables can cause extra gas to form and this can also increase the bloating.  Drink plenty of fluids but you should avoid alcoholic beverages for 24 hours.  ACTIVITY: Your care partner should take you home directly after the procedure.  You should plan to take it easy, moving slowly for the rest of the day.  You can resume normal activity the day after the procedure however you should NOT DRIVE or use heavy machinery for 24 hours (because of the sedation medicines used during the test).    SYMPTOMS TO REPORT IMMEDIATELY: A gastroenterologist can be reached at any hour.  During normal business hours, 8:30 AM to 5:00 PM Monday through Friday,  call [phone].  After hours and on weekends, please call the GI answering service at [phone] who will take a message and have the physician on call contact you.   Following lower endoscopy (colonoscopy or flexible sigmoidoscopy):  Excessive amounts of blood in the stool  Significant tenderness or worsening of abdominal pains  Swelling of the abdomen that is new, acute  Fever of 100F or higher    FOLLOW UP: If any biopsies were taken you will be contacted by phone or by letter within the next 1-3 weeks.  Call your gastroenterologist if you have not heard about the biopsies in 3 weeks.  Our staff will call the home number listed on your records the next business day following your procedure to check on you and address any questions or concerns that you may have at that time regarding the information given to you following your procedure. This is a courtesy call and so if there is no answer at the home number and we have not heard from you through the emergency physician on call, we will assume that you have returned to your regular daily activities without incident.  SIGNATURES/CONFIDENTIALITY: You and/or your care partner have signed paperwork which will be entered into your electronic medical record.  These signatures attest to the fact that that the information above on your After Visit Summary has been reviewed and is understood.  Full responsibility of the confidentiality   of this discharge information lies with you and/or your care-partner.    You may resume your current medications today. Please call if any questions or concerns. Handouts were given to your care partner on polyps, diverticulosis and a high fiber diet.

## 2012-12-21 NOTE — Progress Notes (Signed)
No complaints noted. Maw   

## 2012-12-21 NOTE — Progress Notes (Signed)
Lidocaine-40mg IV prior to Propofol InductionPropofol given over incremental dosages 

## 2012-12-21 NOTE — Progress Notes (Signed)
Patient did not experience any of the following events: a burn prior to discharge; a fall within the facility; wrong site/side/patient/procedure/implant event; or a hospital transfer or hospital admission upon discharge from the facility. (G8907) Patient did not have preoperative order for IV antibiotic SSI prophylaxis. (G8918)  

## 2012-12-22 ENCOUNTER — Telehealth: Payer: Self-pay

## 2012-12-22 NOTE — Telephone Encounter (Signed)
Follow up call complete 

## 2012-12-22 NOTE — Telephone Encounter (Signed)
  Follow up Call-  Call back number 12/21/2012 06/16/2011  Post procedure Call Back phone  # [phone]  Permission to leave phone message Yes Yes     Patient questions:  Do you have a fever, pain , or abdominal swelling? no Pain Score  0 *  Have you tolerated food without any problems? yes  Have you been able to return to your normal activities? yes  Do you have any questions about your discharge instructions: Diet   no Medications  no Follow up visit  no  Do you have questions or concerns about your Care? no  Actions: * If pain score is 4 or above: No action needed, pain <4.

## 2012-12-25 ENCOUNTER — Ambulatory Visit (INDEPENDENT_AMBULATORY_CARE_PROVIDER_SITE_OTHER): Payer: Medicare Other | Admitting: Family Medicine

## 2012-12-25 ENCOUNTER — Encounter: Payer: Self-pay | Admitting: Family Medicine

## 2012-12-25 VITALS — BP 108/68 | HR 66 | Temp 97.7°F | Wt 202.2 lb

## 2012-12-25 DIAGNOSIS — R209 Unspecified disturbances of skin sensation: Secondary | ICD-10-CM

## 2012-12-25 DIAGNOSIS — E7800 Pure hypercholesterolemia, unspecified: Secondary | ICD-10-CM

## 2012-12-25 DIAGNOSIS — R0989 Other specified symptoms and signs involving the circulatory and respiratory systems: Secondary | ICD-10-CM

## 2012-12-25 DIAGNOSIS — R0609 Other forms of dyspnea: Secondary | ICD-10-CM

## 2012-12-25 DIAGNOSIS — R202 Paresthesia of skin: Secondary | ICD-10-CM

## 2012-12-25 MED ORDER — ATORVASTATIN CALCIUM 40 MG PO TABS: 20.0000 mg | ORAL_TABLET | Freq: Every day | ORAL | Status: DC

## 2012-12-25 NOTE — Assessment & Plan Note (Signed)
Isolated, not present now, not concerned for CVA/TIA, could be position, from overuse. Would follow clinically.

## 2012-12-25 NOTE — Assessment & Plan Note (Signed)
Cramp noted after statin change, cut dose in half for a few weeks and then report back.  He agrees.  No other clear trigger known.

## 2012-12-25 NOTE — Progress Notes (Signed)
L foot cramping and pain.  Started after statin change.  No R foot sx. Episodic, about once a week, at rest usually.  No exertional sx.  No other aches.    L hand and forearm occ numb, dorsally.  No trigger known.  Episodic.  No weakness.    Meds, vitals, and allergies reviewed.   ROS: See HPI.  Otherwise, noncontributory.  nad ncat Mmm rrr ctab abd soft, not ttp Ext w/o edema L foot with normal inspection and palpation, normal DP pulse No rash L arm with normal sensation and strength, phalen and tinel wnl Normal ROM at the neck, L shoulder, L elbow. Grip wnl

## 2012-12-25 NOTE — Patient Instructions (Addendum)
Cut the lipitor in half and give me an update in about 3 weeks, sooner if needed.  Take care.  Glad to see you.

## 2012-12-26 ENCOUNTER — Encounter: Payer: Self-pay | Admitting: Gastroenterology

## 2013-01-19 NOTE — Telephone Encounter (Signed)
Follow up call complete 

## 2013-02-05 ENCOUNTER — Other Ambulatory Visit: Payer: Self-pay | Admitting: Cardiology

## 2013-02-12 ENCOUNTER — Ambulatory Visit (INDEPENDENT_AMBULATORY_CARE_PROVIDER_SITE_OTHER): Payer: Medicare Other | Admitting: Family Medicine

## 2013-02-12 ENCOUNTER — Encounter: Payer: Self-pay | Admitting: Family Medicine

## 2013-02-12 VITALS — BP 112/70 | HR 84 | Temp 98.3°F | Wt 203.2 lb

## 2013-02-12 DIAGNOSIS — J069 Acute upper respiratory infection, unspecified: Secondary | ICD-10-CM | POA: Diagnosis not present

## 2013-02-12 MED ORDER — BENZONATATE 200 MG PO CAPS: 200.0000 mg | ORAL_CAPSULE | Freq: Three times a day (TID) | ORAL | Status: DC | PRN

## 2013-02-12 NOTE — Patient Instructions (Addendum)
Take tessalon for cough- rx sent.  Take Guaifenesin/mucinex by mouth AM and noon. Drink lots of fluids.  Keep a lozenge in the mouth all day long. Take care. Get some rest.

## 2013-02-12 NOTE — Assessment & Plan Note (Signed)
Sinuses not ttp.  Nontoxic.  Likely viral.  Use mucinex and tessalon, inc fluids and f/u prn.  Flu shot when well.

## 2013-02-12 NOTE — Progress Notes (Signed)
"  Cold, bad at night."  Didn't know what to take for sx given his BP meds.  Sx started about 4-5 days. Started with cough, some facial pressure.  No fevers.  Occ sputum, not consistent.  No rhinorrhea. Stuffy.  No ear pain.  Roof mouth if irritated. Tongue feels normal.  Taste is not altered.  No myalgias.  Cough is worse at night.  Not fatigued.    Meds, vitals, and allergies reviewed.   ROS: See HPI.  Otherwise, noncontributory.  GEN: nad, alert and oriented HEENT: mucous membranes moist, tm w/o erythema, nasal exam w/o erythema, clear discharge noted,  OP with cobblestoning, hard palate wnl NECK: supple w/o LA CV: rrr.   PULM: ctab, no inc wob EXT: no edema SKIN: no acute rash

## 2013-03-12 DIAGNOSIS — H2510 Age-related nuclear cataract, unspecified eye: Secondary | ICD-10-CM | POA: Diagnosis not present

## 2013-03-12 DIAGNOSIS — H4011X0 Primary open-angle glaucoma, stage unspecified: Secondary | ICD-10-CM | POA: Diagnosis not present

## 2013-03-15 ENCOUNTER — Other Ambulatory Visit: Payer: Self-pay

## 2013-05-29 ENCOUNTER — Other Ambulatory Visit: Payer: Self-pay | Admitting: *Deleted

## 2013-05-29 ENCOUNTER — Encounter: Payer: Self-pay | Admitting: Family Medicine

## 2013-05-29 ENCOUNTER — Encounter: Payer: Self-pay | Admitting: Cardiology

## 2013-05-29 MED ORDER — LOSARTAN POTASSIUM 25 MG PO TABS: 25.0000 mg | ORAL_TABLET | Freq: Every day | ORAL | Status: DC

## 2013-05-29 NOTE — Telephone Encounter (Signed)
I have sent in a prescription for Losartan 25mg  one tablet daily #90 tablets 3 refills to [facility] in [place] for you.

## 2013-05-31 ENCOUNTER — Other Ambulatory Visit: Payer: Self-pay | Admitting: *Deleted

## 2013-05-31 DIAGNOSIS — E7800 Pure hypercholesterolemia, unspecified: Secondary | ICD-10-CM

## 2013-05-31 DIAGNOSIS — R0609 Other forms of dyspnea: Secondary | ICD-10-CM

## 2013-05-31 DIAGNOSIS — R0989 Other specified symptoms and signs involving the circulatory and respiratory systems: Secondary | ICD-10-CM

## 2013-05-31 MED ORDER — ATORVASTATIN CALCIUM 40 MG PO TABS: 20.0000 mg | ORAL_TABLET | Freq: Every day | ORAL | Status: DC

## 2013-05-31 MED ORDER — CARVEDILOL 3.125 MG PO TABS: 3.1250 mg | ORAL_TABLET | Freq: Two times a day (BID) | ORAL | Status: DC

## 2013-06-04 ENCOUNTER — Other Ambulatory Visit: Payer: Self-pay | Admitting: *Deleted

## 2013-06-04 DIAGNOSIS — E7800 Pure hypercholesterolemia, unspecified: Secondary | ICD-10-CM

## 2013-06-04 DIAGNOSIS — R0609 Other forms of dyspnea: Secondary | ICD-10-CM

## 2013-06-04 DIAGNOSIS — R0989 Other specified symptoms and signs involving the circulatory and respiratory systems: Secondary | ICD-10-CM

## 2013-06-04 MED ORDER — ATORVASTATIN CALCIUM 40 MG PO TABS: 20.0000 mg | ORAL_TABLET | Freq: Every day | ORAL | Status: DC

## 2013-06-04 MED ORDER — CARVEDILOL 3.125 MG PO TABS: 3.1250 mg | ORAL_TABLET | Freq: Two times a day (BID) | ORAL | Status: DC

## 2013-07-01 ENCOUNTER — Other Ambulatory Visit: Payer: Self-pay | Admitting: Family Medicine

## 2013-07-01 DIAGNOSIS — E7800 Pure hypercholesterolemia, unspecified: Secondary | ICD-10-CM

## 2013-07-02 ENCOUNTER — Other Ambulatory Visit (INDEPENDENT_AMBULATORY_CARE_PROVIDER_SITE_OTHER): Payer: Medicare Other

## 2013-07-02 DIAGNOSIS — E7800 Pure hypercholesterolemia, unspecified: Secondary | ICD-10-CM

## 2013-07-02 LAB — LIPID PANEL
CHOL/HDL RATIO: 4
Cholesterol: 137 mg/dL (ref 0–200)
HDL: 34.7 mg/dL — ABNORMAL LOW (ref >39.00)
LDL Cholesterol: 84 mg/dL (ref 0–99)
Triglycerides: 92 mg/dL (ref 0.0–149.0)
VLDL: 18.4 mg/dL (ref 0.0–40.0)

## 2013-07-02 LAB — COMPREHENSIVE METABOLIC PANEL
ALBUMIN: 3.7 g/dL (ref 3.5–5.2)
ALT: 21 U/L (ref 0–53)
AST: 22 U/L (ref 0–37)
Alkaline Phosphatase: 54 U/L (ref 39–117)
BUN: 14 mg/dL (ref 6–23)
CHLORIDE: 105 meq/L (ref 96–112)
CO2: 31 mEq/L (ref 19–32)
Calcium: 9 mg/dL (ref 8.4–10.5)
Creatinine, Ser: 0.9 mg/dL (ref 0.4–1.5)
GFR: 113.86 mL/min (ref >60.00)
GLUCOSE: 107 mg/dL — AB (ref 70–99)
POTASSIUM: 3.8 meq/L (ref 3.5–5.1)
Sodium: 141 mEq/L (ref 135–145)
TOTAL PROTEIN: 7.1 g/dL (ref 6.0–8.3)
Total Bilirubin: 1.1 mg/dL (ref 0.3–1.2)

## 2013-07-02 MED ORDER — LOSARTAN POTASSIUM 25 MG PO TABS: 25.0000 mg | ORAL_TABLET | Freq: Every day | ORAL | Status: DC

## 2013-07-02 NOTE — Addendum Note (Signed)
Addended by: Katrine Coho on: 07/02/2013 10:52 AM   Modules accepted: Orders

## 2013-07-09 ENCOUNTER — Ambulatory Visit (INDEPENDENT_AMBULATORY_CARE_PROVIDER_SITE_OTHER): Payer: Medicare Other | Admitting: Family Medicine

## 2013-07-09 ENCOUNTER — Encounter: Payer: Self-pay | Admitting: Family Medicine

## 2013-07-09 VITALS — BP 122/66 | HR 61 | Temp 97.5°F | Ht 72.0 in | Wt 206.5 lb

## 2013-07-09 DIAGNOSIS — E7800 Pure hypercholesterolemia, unspecified: Secondary | ICD-10-CM

## 2013-07-09 DIAGNOSIS — Z0000 Encounter for general adult medical examination without abnormal findings: Secondary | ICD-10-CM

## 2013-07-09 DIAGNOSIS — I10 Essential (primary) hypertension: Secondary | ICD-10-CM | POA: Insufficient documentation

## 2013-07-09 DIAGNOSIS — M722 Plantar fascial fibromatosis: Secondary | ICD-10-CM | POA: Diagnosis not present

## 2013-07-09 DIAGNOSIS — K219 Gastro-esophageal reflux disease without esophagitis: Secondary | ICD-10-CM

## 2013-07-09 MED ORDER — OMEPRAZOLE 20 MG PO CPDR: 20.0000 mg | DELAYED_RELEASE_CAPSULE | Freq: Two times a day (BID) | ORAL | Status: DC

## 2013-07-09 NOTE — Assessment & Plan Note (Signed)
Controlled, continue as is, labs d/w pt.

## 2013-07-09 NOTE — Assessment & Plan Note (Signed)
Controlled, continue as is, labs d/w pt.  

## 2013-07-09 NOTE — Assessment & Plan Note (Signed)
Refer

## 2013-07-09 NOTE — Progress Notes (Signed)
Pre visit review using our clinic review tool, if applicable. No additional management support is needed unless otherwise documented below in the visit note.  I have personally reviewed the Medicare Annual Wellness questionnaire and have noted 1. The patient's medical and social history 2. Their use of alcohol, tobacco or illicit drugs 3. Their current medications and supplements 4. The patient's functional ability including ADL's, fall risks, home safety risks and hearing or visual             impairment. 5. Diet and physical activities 6. Evidence for depression or mood disorders  The patients weight, height, BMI have been recorded in the chart and visual acuity is per eye clinic.  I have made referrals, counseling and provided education to the patient based review of the above and I have provided the pt with a written personalized care plan for preventive services.  See scanned forms.  Routine anticipatory guidance given to patient.  See health maintenance. Flu 2014 Shingles 2013 PNA 2011 Tetanus 2010 Colonoscopy 2014 Prostate cancer screening and PSA options (with potential risks and benefits of testing vs not testing) were discussed along with recent recs/guidelines.  He declined testing PSA at this point.  We can reconsider next year.   Advance directive d/w pt.  Wife designated if incapacitated.   Cognitive function addressed- see scanned forms- and if abnormal then additional documentation follows.   Plantar fasciitis pain continues.  Limites walking in spite of stretching. B plantar pain.  Asking about options, discussed ortho referral.   Hypertension:    Using medication without problems or lightheadedness: yes Chest pain with exertion:no Edema:no Short of breath:no  Elevated Cholesterol: Using medications without problems:yes Muscle aches: no Diet compliance:yes Exercise:yes  GERD.  Known HH.  Generally controlled with current meds, no ADE.  He does have cough when  supine.  Discussed possible GERD component.   PMH and SH reviewed  Meds, vitals, and allergies reviewed.   ROS: See HPI.  Otherwise negative.    GEN: nad, alert and oriented HEENT: mucous membranes moist NECK: supple w/o LA CV: rrr. PULM: ctab, no inc wob ABD: soft, +bs EXT: no edema SKIN: no acute rash

## 2013-07-09 NOTE — Assessment & Plan Note (Signed)
See scanned forms.  Routine anticipatory guidance given to patient.  See health maintenance. Flu 2014 Shingles 2013 PNA 2011 Tetanus 2010 Colonoscopy 2014 Prostate cancer screening and PSA options (with potential risks and benefits of testing vs not testing) were discussed along with recent recs/guidelines.  He declined testing PSA at this point.  We can reconsider next year.   Advance directive d/w pt.  Wife designated if incapacitated.   Cognitive function addressed- see scanned forms- and if abnormal then additional documentation follows.

## 2013-07-09 NOTE — Patient Instructions (Signed)
Paul Fry will call about your referral. Try elevating the head of your bed.  That may help some.  Keep taking the omeprazole.  Glad to see you.

## 2013-07-09 NOTE — Assessment & Plan Note (Signed)
Continue current meds, elevate head of bed, f/u prn.

## 2013-07-10 ENCOUNTER — Telehealth: Payer: Self-pay | Admitting: Family Medicine

## 2013-07-10 NOTE — Telephone Encounter (Signed)
Relevant patient education assigned to patient using Emmi. ° °

## 2013-07-11 ENCOUNTER — Other Ambulatory Visit: Payer: Self-pay | Admitting: Family Medicine

## 2013-07-11 DIAGNOSIS — K219 Gastro-esophageal reflux disease without esophagitis: Secondary | ICD-10-CM

## 2013-07-11 NOTE — Telephone Encounter (Signed)
Refill sent to pharmacy per patient's request. 

## 2013-07-11 NOTE — Addendum Note (Signed)
Addended by: Emelia Salisbury C on: 07/11/2013 06:50 PM   Modules accepted: Orders

## 2013-07-11 NOTE — Telephone Encounter (Signed)
Refill just sent to mail order pharmacy. Spoke to patient and was advised that he needs a 30 supply at [facility] to last until he can get the medication from his mail order pharmacy.

## 2013-07-26 DIAGNOSIS — M25579 Pain in unspecified ankle and joints of unspecified foot: Secondary | ICD-10-CM | POA: Diagnosis not present

## 2013-08-03 DIAGNOSIS — M79609 Pain in unspecified limb: Secondary | ICD-10-CM | POA: Diagnosis not present

## 2013-08-17 DIAGNOSIS — M79609 Pain in unspecified limb: Secondary | ICD-10-CM | POA: Diagnosis not present

## 2013-08-17 DIAGNOSIS — M25579 Pain in unspecified ankle and joints of unspecified foot: Secondary | ICD-10-CM | POA: Diagnosis not present

## 2014-02-02 ENCOUNTER — Encounter: Payer: Self-pay | Admitting: Gastroenterology

## 2014-02-25 ENCOUNTER — Ambulatory Visit: Payer: Medicare Other | Admitting: Family Medicine

## 2014-02-26 ENCOUNTER — Encounter: Payer: Self-pay | Admitting: Family Medicine

## 2014-02-26 ENCOUNTER — Ambulatory Visit (INDEPENDENT_AMBULATORY_CARE_PROVIDER_SITE_OTHER): Payer: Medicare Other | Admitting: Family Medicine

## 2014-02-26 VITALS — BP 120/60 | HR 66 | Temp 98.4°F | Wt 204.0 lb

## 2014-02-26 DIAGNOSIS — L989 Disorder of the skin and subcutaneous tissue, unspecified: Secondary | ICD-10-CM

## 2014-02-26 DIAGNOSIS — Z23 Encounter for immunization: Secondary | ICD-10-CM | POA: Diagnosis not present

## 2014-02-26 MED ORDER — CEPHALEXIN 500 MG PO CAPS: 500.0000 mg | ORAL_CAPSULE | Freq: Three times a day (TID) | ORAL | Status: DC

## 2014-02-26 NOTE — Patient Instructions (Signed)
Take the keflex for 1 week.  If not better, then schedule a 87min appointment and I can shave off the area.  Take care.  Glad to see you.

## 2014-02-26 NOTE — Progress Notes (Signed)
Pre visit review using our clinic review tool, if applicable. No additional management support is needed unless otherwise documented below in the visit note.  Sore on L lower abd/side.  Itching.  Present for 2-3 months.  Not getting bigger, just doesn't heal.  Sometimes is more sore than other times.  No FCNAVD.  No other similar spots.  No clear trigger.  He didn't recall any injury to the area.   He has noted cold feet w/o claudication B.   Meds, vitals, and allergies reviewed.   ROS: See HPI.  Otherwise, noncontributory.  nad 0.5cm area on the L lower abd wall that looks to have postinflammatory hyperpigmentation at a small papule. Nontender, not ulcerated.  No fluctuant mass.   Normal foot inspection with normal DP pulses B

## 2014-02-27 DIAGNOSIS — L989 Disorder of the skin and subcutaneous tissue, unspecified: Secondary | ICD-10-CM | POA: Insufficient documentation

## 2014-02-27 NOTE — Assessment & Plan Note (Addendum)
Doesn't look anything like a typical skin cancer.  This could have been a small area of folliculitis, not fully resolved.  Single lesion.  Too small, no need for I&D.  Trial of keflex.  If not resolved, then I can shave it off.  He agrees.  Reassured about the incidental foot complaints, this may be normal for him.

## 2014-03-14 DIAGNOSIS — H2513 Age-related nuclear cataract, bilateral: Secondary | ICD-10-CM | POA: Diagnosis not present

## 2014-03-14 DIAGNOSIS — H25013 Cortical age-related cataract, bilateral: Secondary | ICD-10-CM | POA: Diagnosis not present

## 2014-04-25 ENCOUNTER — Ambulatory Visit (INDEPENDENT_AMBULATORY_CARE_PROVIDER_SITE_OTHER): Payer: Medicare Other | Admitting: Podiatry

## 2014-04-25 ENCOUNTER — Ambulatory Visit (INDEPENDENT_AMBULATORY_CARE_PROVIDER_SITE_OTHER): Payer: Medicare Other

## 2014-04-25 DIAGNOSIS — I739 Peripheral vascular disease, unspecified: Secondary | ICD-10-CM | POA: Diagnosis not present

## 2014-04-25 DIAGNOSIS — M722 Plantar fascial fibromatosis: Secondary | ICD-10-CM

## 2014-04-25 MED ORDER — TRIAMCINOLONE ACETONIDE 10 MG/ML IJ SUSP
10.0000 mg | Freq: Once | INTRAMUSCULAR | Status: AC
Start: 2014-04-25 — End: 2014-04-25
  Administered 2014-04-25: 10 mg

## 2014-04-25 NOTE — Progress Notes (Signed)
Subjective:     Patient ID: Paul Fry, male   DOB: 07/21/1945, 68 y.o.   MRN: [id number]  HPI patient states I get pain in my heels left over right and it's been going on for a while and like to take walks with my wife but I been unable to because of the burning sharp pain experience in my feet in general can hurt but I do not get pains in my legs or cramps   Review of Systems  All other systems reviewed and are negative.      Objective:   Physical Exam  Constitutional: He is oriented to person, place, and time.  Cardiovascular: Intact distal pulses.   Musculoskeletal: Normal range of motion.  Neurological: He is oriented to person, place, and time.  Skin: Skin is warm.  Nursing note and vitals reviewed.  neurovascular status intact with muscle strength adequate range of motion subtalar midtarsal joint within normal limits. Patient is noted to have some shiny skin appearance which concerns me and does have quite a bit of discomfort in the plantar heel region left over right at the medial calcaneal tubercle. Digits are well-perfused and patient well oriented 3     Assessment:     Plantar fasciitis left over right with possibility of subtle vascular disease which may be present    Plan:     H&P and x-rays reviewed and I discussed the calcifications of his distal arteries and these continue to keep a very close eye on not only these but also cardiac and other vascular issues. Today I injected the plantar heel bilateral 3 mg Kenalog 5 mg Xylocaine and applied fascially brace left and will reappoint within the next 2 weeks

## 2014-04-25 NOTE — Progress Notes (Signed)
   Subjective:    Patient ID: Paul Fry, male    DOB: January 22, 1946, 68 y.o.   MRN: [id number]  HPI Comments: "I have pain in the whole bottom of my foot"  Patient c/o burning sensation plantar foot (forefoot, arch and heel) for several months. Worse in the AM. PCP gave injections in heels-no help. Tried motrin and rest.  Foot Pain      Review of Systems  All other systems reviewed and are negative.      Objective:   Physical Exam        Assessment & Plan:

## 2014-04-30 ENCOUNTER — Encounter: Payer: Self-pay | Admitting: Cardiology

## 2014-05-09 ENCOUNTER — Ambulatory Visit (INDEPENDENT_AMBULATORY_CARE_PROVIDER_SITE_OTHER): Payer: Medicare Other | Admitting: Podiatry

## 2014-05-09 VITALS — BP 138/71 | HR 82 | Resp 16

## 2014-05-09 DIAGNOSIS — M722 Plantar fascial fibromatosis: Secondary | ICD-10-CM

## 2014-05-09 MED ORDER — TRIAMCINOLONE ACETONIDE 10 MG/ML IJ SUSP
10.0000 mg | Freq: Once | INTRAMUSCULAR | Status: AC
  Administered 2014-05-09: 10 mg

## 2014-05-09 NOTE — Progress Notes (Signed)
Subjective:     Patient ID: Paul Fry, male   DOB: [birth year], 68 y.o.   MRN: [id number]  HPI patient states that he is still having heel pain both feet but it has improved by about 50%. Also getting cramps in his arches   Review of Systems     Objective:   Physical Exam Neurovascular status intact muscle strength was adequate with discomfort in the plantar heel left and right of a moderate intensity and no current indications of vascular shutdown or vascular disease    Assessment:     Plantar fasciitis bilateral with mild cramping which is probably due to change in gait    Plan:     Reinjected the plantar fascia bilateral 3 mg Kenalog 5 mg Xylocaine and instructed on physical therapy and drinking a couple ounces of time and water at night. Reappoint for [place] to recheck again if symptoms persist or any other issues occur

## 2014-05-10 HISTORY — PX: CATARACT EXTRACTION W/ INTRAOCULAR LENS  IMPLANT, BILATERAL: SHX1307

## 2014-05-28 DIAGNOSIS — H2512 Age-related nuclear cataract, left eye: Secondary | ICD-10-CM | POA: Diagnosis not present

## 2014-05-28 DIAGNOSIS — H25012 Cortical age-related cataract, left eye: Secondary | ICD-10-CM | POA: Diagnosis not present

## 2014-05-28 DIAGNOSIS — H25812 Combined forms of age-related cataract, left eye: Secondary | ICD-10-CM | POA: Diagnosis not present

## 2014-07-04 ENCOUNTER — Other Ambulatory Visit: Payer: Self-pay | Admitting: Family Medicine

## 2014-07-04 DIAGNOSIS — I10 Essential (primary) hypertension: Secondary | ICD-10-CM

## 2014-07-10 ENCOUNTER — Other Ambulatory Visit (INDEPENDENT_AMBULATORY_CARE_PROVIDER_SITE_OTHER): Payer: Medicare Other

## 2014-07-10 DIAGNOSIS — I10 Essential (primary) hypertension: Secondary | ICD-10-CM

## 2014-07-10 LAB — COMPREHENSIVE METABOLIC PANEL
ALK PHOS: 54 U/L (ref 39–117)
ALT: 21 U/L (ref 0–53)
AST: 19 U/L (ref 0–37)
Albumin: 3.8 g/dL (ref 3.5–5.2)
BILIRUBIN TOTAL: 0.9 mg/dL (ref 0.2–1.2)
BUN: 12 mg/dL (ref 6–23)
CO2: 30 meq/L (ref 19–32)
CREATININE: 0.88 mg/dL (ref 0.40–1.50)
Calcium: 9.1 mg/dL (ref 8.4–10.5)
Chloride: 105 mEq/L (ref 96–112)
GFR: 110.54 mL/min (ref >60.00)
GLUCOSE: 128 mg/dL — AB (ref 70–99)
Potassium: 4 mEq/L (ref 3.5–5.1)
Sodium: 139 mEq/L (ref 135–145)
Total Protein: 7.1 g/dL (ref 6.0–8.3)

## 2014-07-10 LAB — LIPID PANEL
CHOL/HDL RATIO: 5
Cholesterol: 137 mg/dL (ref 0–200)
HDL: 30.3 mg/dL — ABNORMAL LOW (ref >39.00)
LDL CALC: 87 mg/dL (ref 0–99)
NONHDL: 106.7
Triglycerides: 100 mg/dL (ref 0.0–149.0)
VLDL: 20 mg/dL (ref 0.0–40.0)

## 2014-07-11 ENCOUNTER — Other Ambulatory Visit: Payer: Medicare Other

## 2014-07-16 ENCOUNTER — Encounter: Payer: Self-pay | Admitting: Family Medicine

## 2014-07-16 ENCOUNTER — Ambulatory Visit (INDEPENDENT_AMBULATORY_CARE_PROVIDER_SITE_OTHER): Payer: Medicare Other | Admitting: Family Medicine

## 2014-07-16 VITALS — BP 118/70 | HR 80 | Temp 98.0°F | Ht 72.0 in | Wt 209.8 lb

## 2014-07-16 DIAGNOSIS — Z7189 Other specified counseling: Secondary | ICD-10-CM

## 2014-07-16 DIAGNOSIS — I429 Cardiomyopathy, unspecified: Secondary | ICD-10-CM

## 2014-07-16 DIAGNOSIS — Z23 Encounter for immunization: Secondary | ICD-10-CM

## 2014-07-16 DIAGNOSIS — E7800 Pure hypercholesterolemia, unspecified: Secondary | ICD-10-CM

## 2014-07-16 DIAGNOSIS — Z0000 Encounter for general adult medical examination without abnormal findings: Secondary | ICD-10-CM | POA: Diagnosis not present

## 2014-07-16 DIAGNOSIS — K219 Gastro-esophageal reflux disease without esophagitis: Secondary | ICD-10-CM

## 2014-07-16 DIAGNOSIS — R739 Hyperglycemia, unspecified: Secondary | ICD-10-CM

## 2014-07-16 LAB — HEMOGLOBIN A1C: HEMOGLOBIN A1C: 6.5 % (ref 4.6–6.5)

## 2014-07-16 MED ORDER — LOSARTAN POTASSIUM 25 MG PO TABS: 25.0000 mg | ORAL_TABLET | Freq: Every day | ORAL | Status: DC

## 2014-07-16 MED ORDER — OMEPRAZOLE 20 MG PO CPDR: 20.0000 mg | DELAYED_RELEASE_CAPSULE | Freq: Two times a day (BID) | ORAL | Status: DC

## 2014-07-16 MED ORDER — ATORVASTATIN CALCIUM 40 MG PO TABS: 20.0000 mg | ORAL_TABLET | Freq: Every day | ORAL | Status: DC

## 2014-07-16 MED ORDER — CARVEDILOL 3.125 MG PO TABS
3.1250 mg | ORAL_TABLET | Freq: Two times a day (BID) | ORAL | Status: DC
Start: 2014-07-16 — End: 2015-07-17

## 2014-07-16 NOTE — Assessment & Plan Note (Signed)
Worsened, d/w pt.  Now with sugar >125.  A1c pending.  D/w pt, handout given re: low carb foods.  D/w pt about diet and exercise specifically related to hyperglycemia.  He agrees.  We'll make plans when I get his A1c back.

## 2014-07-16 NOTE — Addendum Note (Signed)
Addended by: Marchia Bond on: 07/16/2014 03:31 PM   Modules accepted: Miquel Dunn

## 2014-07-16 NOTE — Assessment & Plan Note (Signed)
Flu 2015 Shingles 2013 PNA 2011 Tetanus 2010 Colonoscopy 2014 Prostate cancer screening and PSA options (with potential risks and benefits of testing vs not testing) were discussed along with recent recs/guidelines. He declined testing PSA at this point. We can reconsider next year.  Advance directive d/w pt. Wife designated if incapacitated.  Cognitive function addressed- see scanned forms- and if abnormal then additional documentation follows.

## 2014-07-16 NOTE — Assessment & Plan Note (Signed)
Continue current meds, refer back to cards.  Doesn't look fluid overloaded.   Lipids controlled, but sugar up.  I want to prevent any further progression of hyperglycemia/possible DM2.  He agrees, understoood.

## 2014-07-16 NOTE — Patient Instructions (Signed)
[facility] will call about your referral. Go to the lab on the way out.  We'll contact you with your lab report. Take care.  Glad to see you.  

## 2014-07-16 NOTE — Progress Notes (Signed)
Pre visit review using our clinic review tool, if applicable. No additional management support is needed unless otherwise documented below in the visit note.  I have personally reviewed the Medicare Annual Wellness questionnaire and have noted 1. The patient's medical and social history 2. Their use of alcohol, tobacco or illicit drugs 3. Their current medications and supplements 4. The patient's functional ability including ADL's, fall risks, home safety risks and hearing or visual             impairment. 5. Diet and physical activities 6. Evidence for depression or mood disorders  The patients weight, height, BMI have been recorded in the chart and visual acuity is per eye clinic.  I have made referrals, counseling and provided education to the patient based review of the above and I have provided the pt with a written personalized care plan for preventive services.  Provider list updated- see scanned forms.  Routine anticipatory guidance given to patient.  See health maintenance.  Flu 2015 Shingles 2013 PNA 2011 Tetanus 2010 Colonoscopy 2014 Prostate cancer screening and PSA options (with potential risks and benefits of testing vs not testing) were discussed along with recent recs/guidelines. He declined testing PSA at this point. We can reconsider next year.  Advance directive d/w pt. Wife designated if incapacitated.  Cognitive function addressed- see scanned forms- and if abnormal then additional documentation follows.   Hyperglycemia.  D/w pt.  Now with sugar >125.  A1c pending.  D/w pt, handout given re: low carb foods.  D/w pt about diet and exercise specifically related to hyperglycemia.   Cardiomyopathy.  Due for f/u with cards.  D/w pt.  No CP, SOB, BLE edema.  No claudication sx.  Tolerating current ARB/statin without ADE except for occ nighttime muscle cramps.    PMH and SH reviewed  Meds, vitals, and allergies reviewed.   ROS: See HPI.  Otherwise negative.     GEN: nad, alert and oriented HEENT: mucous membranes moist NECK: supple w/o LA CV: rrr. PULM: ctab, no inc wob ABD: soft, +bs EXT: no edema SKIN: no acute rash

## 2014-07-17 ENCOUNTER — Other Ambulatory Visit: Payer: Self-pay | Admitting: Family Medicine

## 2014-07-17 ENCOUNTER — Encounter: Payer: Self-pay | Admitting: Family Medicine

## 2014-07-17 DIAGNOSIS — E119 Type 2 diabetes mellitus without complications: Secondary | ICD-10-CM

## 2014-07-18 ENCOUNTER — Telehealth: Payer: Self-pay

## 2014-07-18 NOTE — Telephone Encounter (Signed)
-----   Message from [name], MD sent at 07/17/2014 11:08 PM EST ----- Call pt.  Has DM2 by A1c.  Doesn't need meds.  Would work on low carb diet as discussed and recheck A1c before a visit in about 3 months.  Thanks.

## 2014-07-18 NOTE — Telephone Encounter (Signed)
Left message for patient to call office regarding lab results.

## 2014-07-30 DIAGNOSIS — H25011 Cortical age-related cataract, right eye: Secondary | ICD-10-CM | POA: Diagnosis not present

## 2014-07-30 DIAGNOSIS — H2511 Age-related nuclear cataract, right eye: Secondary | ICD-10-CM | POA: Diagnosis not present

## 2014-07-30 DIAGNOSIS — H25811 Combined forms of age-related cataract, right eye: Secondary | ICD-10-CM | POA: Diagnosis not present

## 2014-08-02 ENCOUNTER — Ambulatory Visit: Payer: Medicare Other | Admitting: Cardiovascular Disease

## 2014-09-05 ENCOUNTER — Encounter: Payer: Self-pay | Admitting: *Deleted

## 2014-09-05 ENCOUNTER — Ambulatory Visit (INDEPENDENT_AMBULATORY_CARE_PROVIDER_SITE_OTHER): Payer: Medicare Other | Admitting: Cardiology

## 2014-09-05 ENCOUNTER — Encounter: Payer: Self-pay | Admitting: Cardiology

## 2014-09-05 VITALS — BP 112/62 | HR 70 | Ht 72.0 in | Wt 201.0 lb

## 2014-09-05 DIAGNOSIS — K2100 Gastro-esophageal reflux disease with esophagitis, without bleeding: Secondary | ICD-10-CM

## 2014-09-05 DIAGNOSIS — E7800 Pure hypercholesterolemia, unspecified: Secondary | ICD-10-CM

## 2014-09-05 DIAGNOSIS — I429 Cardiomyopathy, unspecified: Secondary | ICD-10-CM | POA: Diagnosis not present

## 2014-09-05 NOTE — Progress Notes (Signed)
Patient ID: Paul Fry, male   DOB: 03-08-1946, 69 y.o.   MRN: [id number] PCP: Dr. 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  He had a cath in 5/10 with mild nonobstructive CAD.  He had atypical chest pain in 3/13 and ETT-myoview was done, showing EF 47%, no ischemia or infarction.  Given the abnormal EF, he had an echo done which showed EF 50-55% with mild LVH.  Cardiac MR was done in 4/14, showing EF 49% with distal septal and apical hypokinesis, no delayed enhancement.    I have not seen him in a couple of years.  He seems to have been doing well in the interim.  He continues to have GERD-like symptoms after eating certain foods, especially when he lies down in the bed at night.  He is using omeprazole which helps. No exertional chest pain.  No exertional dyspnea, orthopnea, or PND.  No palpitations or lightheadedness.  He is not on ASA due to prior GI bleeding on aspirin.   ECG: NSR, normal  Labs (1/13): K 4.3, creatinine 0.9, LDL 97, HDL 38 Labs (1/14): LDL 102, HDL 33 Labs (5/14): K 4.2, creatinine 0.5 Labs (3/16): LDL 87, creatinine 0.88  PMH: 1. GERD 2. Plantar fasciitis 3. Hyperlipidemia 4. Diverticulosis with diverticular bleeding x 2.  Stopped ASA. 5. Left heart cath 5/10: EF 55%, 30-40% mid RCA.  6. Cardiomyopathy: Mild.  ETT-Myoview (3/13) with EF 47%, no ischemia or infarction.  Echo (3/13) with EF 50-55%, mild LVH, mild MR, mild AI.  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   7. Type II diabetes  FH: No premature CAD.  SH: Nonsmoker, lives in [place]  ROS: All systems reviewed and negative except as per HPI.   Current Outpatient Prescriptions  Medication Sig Dispense Refill  . ascorbic acid (VITAMIN C) 1000 MG tablet Take 1,000 mg by mouth daily.    [name] atorvastatin (LIPITOR) 40 MG tablet Take 0.5 tablets (20 mg total) by  mouth daily. 45 tablet 3  . calcium carbonate (TUMS) 500 MG chewable tablet Chew 1 tablet by mouth as needed.      . carvedilol (COREG) 3.125 MG tablet Take 1 tablet (3.125 mg total) by mouth 2 (two) times daily. 180 tablet 3  . fish oil-omega-3 fatty acids 1000 MG capsule Take 1 g by mouth daily.    [name] losartan (COZAAR) 25 MG tablet Take 1 tablet (25 mg total) by mouth daily. 90 tablet 3  . Multiple Vitamin (MULTIVITAMIN) tablet Take 1 tablet by mouth daily.    [name] omeprazole (PRILOSEC) 20 MG capsule Take 1 capsule (20 mg total) by mouth 2 (two) times daily. 180 capsule 3   No current facility-administered medications for this visit.    BP 112/62 mmHg  Pulse 70  Ht 6' (1.829 m)  Wt 201 lb (91.173 kg)  BMI 27.25 kg/m2 General: NAD Neck: No JVD, no thyromegaly or thyroid nodule.  Lungs: Clear to auscultation bilaterally with normal respiratory effort. CV: Nondisplaced PMI.  Heart regular S1/S2, +S4, no murmur.  No peripheral edema.  No carotid bruit.  Normal pedal pulses.  Abdomen: Soft, nontender, no hepatosplenomegaly, no distention.  Neurologic: Alert and oriented x 3.  Psych: Normal affect. Extremities: No clubbing or cyanosis.   Assessment/Plan:  1. Cardiomyopathy: NYHA class I. Mild cardiomyopathy in past, EF 49% on cardiac MRI in 4/14.  No delayed  enhancement on MRI.  There was a suggestion of noncompaction towards the apex.  This may be the cause of his mild cardiomyopathy.  - Continue losartan and Coreg. - I will get an echocardiogram.  If EF is the same or improved, no further intervention and will see him in a year.  If EF is lower, will have him back sooner for followup and adjust his cardiac meds.   2. Hyperlipidemia: Good lipids when recently checked.  3. GERD: Controlled with omeprazole.  No exertional chest pain, only chest burning after certain meals.  Do not think that this is cardiac.    Loralie Champagne 09/05/2014

## 2014-09-05 NOTE — Patient Instructions (Signed)
Medication Instructions:  No changes today.  Labwork: None today.  Testing/Procedures: Your physician has requested that you have an echocardiogram. Echocardiography is a painless test that uses sound waves to create images of your heart. It provides your doctor with information about the size and shape of your heart and how well your heart's chambers and valves are working. This procedure takes approximately one hour. There are no restrictions for this procedure.  Follow-Up: Your physician wants you to follow-up in: 1 year with Dr [name]. (April 2017). You will receive a reminder letter in the mail two months in advance. If you don't receive a letter, please call our office to schedule the follow-up appointment.

## 2014-09-17 ENCOUNTER — Ambulatory Visit (HOSPITAL_COMMUNITY): Payer: Medicare Other | Attending: Cardiovascular Disease

## 2014-09-17 ENCOUNTER — Other Ambulatory Visit: Payer: Self-pay

## 2014-09-17 DIAGNOSIS — I10 Essential (primary) hypertension: Secondary | ICD-10-CM

## 2014-09-17 DIAGNOSIS — I429 Cardiomyopathy, unspecified: Secondary | ICD-10-CM | POA: Diagnosis not present

## 2014-09-17 DIAGNOSIS — E119 Type 2 diabetes mellitus without complications: Secondary | ICD-10-CM | POA: Diagnosis not present

## 2014-09-17 HISTORY — PX: TRANSTHORACIC ECHOCARDIOGRAM: SHX275

## 2014-10-18 ENCOUNTER — Other Ambulatory Visit (INDEPENDENT_AMBULATORY_CARE_PROVIDER_SITE_OTHER): Payer: Medicare Other

## 2014-10-18 DIAGNOSIS — E119 Type 2 diabetes mellitus without complications: Secondary | ICD-10-CM

## 2014-10-18 LAB — HEMOGLOBIN A1C: Hgb A1c MFr Bld: 5.9 % (ref 4.6–6.5)

## 2014-10-22 ENCOUNTER — Encounter: Payer: Self-pay | Admitting: Family Medicine

## 2014-10-22 ENCOUNTER — Ambulatory Visit (INDEPENDENT_AMBULATORY_CARE_PROVIDER_SITE_OTHER): Payer: Medicare Other | Admitting: Family Medicine

## 2014-10-22 VITALS — BP 112/60 | HR 72 | Temp 98.5°F | Wt 197.5 lb

## 2014-10-22 DIAGNOSIS — E119 Type 2 diabetes mellitus without complications: Secondary | ICD-10-CM | POA: Diagnosis not present

## 2014-10-22 DIAGNOSIS — R198 Other specified symptoms and signs involving the digestive system and abdomen: Secondary | ICD-10-CM

## 2014-10-22 DIAGNOSIS — Z8639 Personal history of other endocrine, nutritional and metabolic disease: Secondary | ICD-10-CM

## 2014-10-22 DIAGNOSIS — R0989 Other specified symptoms and signs involving the circulatory and respiratory systems: Secondary | ICD-10-CM

## 2014-10-22 DIAGNOSIS — R6889 Other general symptoms and signs: Secondary | ICD-10-CM

## 2014-10-22 MED ORDER — LORATADINE 10 MG PO TABS: 10.0000 mg | ORAL_TABLET | Freq: Every day | ORAL | Status: DC

## 2014-10-22 NOTE — Progress Notes (Signed)
Pre visit review using our clinic review tool, if applicable. No additional management support is needed unless otherwise documented below in the visit note.  He feels better overall, less sluggish.  More active.  Working on diet, cutting carbs.   A1c much improved, no dx of Dm2 now.  D/w pt.   Not on DM2 meds.    Occ dry cough, more like clearing his throat.  No sputum.  Usually noted at night.  Sensation of phlegm in the throat.  Going on for 2-3 years.  Not getting worse.  Occ heartburn.  No fevers.  No bloody sputum.  Cough isn't constant.    Meds, vitals, and allergies reviewed.   ROS: See HPI.  Otherwise, noncontributory.  GEN: nad, alert and oriented HEENT: mucous membranes moist, nasal exam unremarkable, OP wnl NECK: supple w/o LA CV: rrr.  no murmur PULM: ctab, no inc wob ABD: soft, +bs EXT: no edema

## 2014-10-22 NOTE — Patient Instructions (Addendum)
Recheck A1c before a visit in about 4 months.   Get me list of where you are going on your trip in August and I'll get you list of items to do.   Take claritin 10mg  a day and see if that helps the cough.   Thanks for your effort.  Keep working on your weight.   Take care.  Glad to see you.

## 2014-10-23 DIAGNOSIS — R059 Cough, unspecified: Secondary | ICD-10-CM | POA: Insufficient documentation

## 2014-10-23 DIAGNOSIS — R05 Cough: Secondary | ICD-10-CM | POA: Insufficient documentation

## 2014-10-23 NOTE — Assessment & Plan Note (Signed)
A1c much improved, recheck in a few months, continue with lifestyle interventions. He agrees.  I thanked him for his effort.

## 2014-10-23 NOTE — Assessment & Plan Note (Signed)
Longstanding.  Already on PPI.  ddx d/w pt.  Take claritin 10mg  a day and see if that helps.  He agrees.  No ominous sx or signs.

## 2014-11-07 ENCOUNTER — Encounter: Payer: Self-pay | Admitting: Family Medicine

## 2014-11-07 ENCOUNTER — Ambulatory Visit (INDEPENDENT_AMBULATORY_CARE_PROVIDER_SITE_OTHER): Payer: Medicare Other | Admitting: Family Medicine

## 2014-11-07 VITALS — BP 110/60 | HR 60 | Temp 98.5°F | Wt 196.5 lb

## 2014-11-07 DIAGNOSIS — Z7189 Other specified counseling: Secondary | ICD-10-CM

## 2014-11-07 DIAGNOSIS — M722 Plantar fascial fibromatosis: Secondary | ICD-10-CM

## 2014-11-07 DIAGNOSIS — Z7184 Encounter for health counseling related to travel: Secondary | ICD-10-CM

## 2014-11-07 DIAGNOSIS — Z23 Encounter for immunization: Secondary | ICD-10-CM | POA: Diagnosis not present

## 2014-11-07 MED ORDER — TYPHOID VACCINE PO CPDR: 1.0000 | DELAYED_RELEASE_CAPSULE | ORAL | Status: DC

## 2014-11-07 MED ORDER — CIPROFLOXACIN HCL 500 MG PO TABS: 500.0000 mg | ORAL_TABLET | Freq: Two times a day (BID) | ORAL | Status: DC

## 2014-11-07 NOTE — Progress Notes (Signed)
Pre visit review using our clinic review tool, if applicable. No additional management support is needed unless otherwise documented below in the visit note.  Going to [place] and [place].  Tetanus 2010.  Would be reasonable to get HAV and typhoid done.  D/w pt.   Questions about orthotics.  Foot pain.  Worse with prolonged standing.  Burning pain.  Pain with walking.  Getting off, hot showers help.  Plantar side of foot.  Pain with first step.  Has been stretching, had cortisone shots for plantar fasciitis.    Meds, vitals, and allergies reviewed.   ROS: See HPI.  Otherwise, noncontributory.  Nad Exam deferred.

## 2014-11-07 NOTE — Patient Instructions (Signed)
First dose of Hep A today.  Second dose at nurse visit or office visit after 05/11/2015.  Typhoid vaccine sent to pharmacy.  Take it before the trip.   Take cipro with you and use it if you have traveler's diarrhea on the trip.   I would try to get orthotics done at the foot clinic or here with Dr. Lorelei Pont, whichever can be done sooner.   Take care.  To see you.

## 2014-11-08 DIAGNOSIS — Z7184 Encounter for health counseling related to travel: Secondary | ICD-10-CM | POA: Insufficient documentation

## 2014-11-08 NOTE — Assessment & Plan Note (Addendum)
Going to [place] and [place]. Tetanus 2010. Would be reasonable to get HAV and typhoid done. D/w pt.  Can use cipro prn for traveler's diarrhea.   Routine cautions given to patient.  Can get 2nd HAV dose in early 2017.  >15 minutes spent in face to face time with patient, >50% spent in counselling or coordination of care

## 2014-11-08 NOTE — Assessment & Plan Note (Signed)
He'll check with Dr. Lorelei Pont or podiatry clinic about orthotics, to see who can get him in quicker since he'll be going on the trip.

## 2014-11-13 ENCOUNTER — Encounter: Payer: Self-pay | Admitting: Family Medicine

## 2014-11-13 ENCOUNTER — Ambulatory Visit (INDEPENDENT_AMBULATORY_CARE_PROVIDER_SITE_OTHER): Payer: Medicare Other | Admitting: Family Medicine

## 2014-11-13 VITALS — BP 130/70 | HR 64 | Temp 97.6°F | Ht 72.0 in | Wt 198.5 lb

## 2014-11-13 DIAGNOSIS — M2010 Hallux valgus (acquired), unspecified foot: Secondary | ICD-10-CM

## 2014-11-13 DIAGNOSIS — M722 Plantar fascial fibromatosis: Secondary | ICD-10-CM

## 2014-11-13 NOTE — Progress Notes (Signed)
Pre visit review using our clinic review tool, if applicable. No additional management support is needed unless otherwise documented below in the visit note. 

## 2014-11-13 NOTE — Progress Notes (Signed)
Dr. Frederico Hamman T. Jacobie Stamey, MD, [facility] [place], [address] Phone: [phone] Fax: [phone]  11/13/2014  Patient: Paul Fry, MRN: [id number], DOB: 01-19-1946, 69 y.o.  Primary Physician:  Elsie Stain, MD  Chief Complaint: Foot Orthotics  Subjective:   Paul Fry is a 69 y.o. very pleasant male patient who presents with the following:  Consult plantar fascia and orthotics.   Very pleasant gentleman who has been struggling with plantar fasciitis for many months, doing routine regular stretching, he has bought multiple. As if she is not over-the-counter orthotics, and he additionally has had multiple rounds of Kenalog injections by podiatry. He still is fairly miserable due to his heel pain, and he is here to have any kind of advice or recommendations. My partner thought that he might be an excellent candidate for custom orthotics.  Past Medical History, Surgical History, Social History, Family History, Problem List, Medications, and Allergies have been reviewed and updated if relevant.  Patient Active Problem List   Diagnosis Date Noted  . Travel advice encounter 11/08/2014  . Throat clearing 10/23/2014  . Advance care planning 07/16/2014  . History of diabetes mellitus, type II 07/16/2014  . Skin lesion 02/27/2014  . HTN (hypertension) 07/09/2013  . Paresthesia 12/25/2012  . Cardiomyopathy 09/26/2012  . ED (erectile dysfunction) 05/26/2012  . DOE (dyspnea on exertion) 06/18/2011  . Plantar fascia syndrome 05/26/2011  . Routine general medical examination at a health care facility 05/26/2011  . Medicare annual wellness visit, subsequent 05/26/2011  . Hypercholesteremia 03/03/2011  . HEMORRHAGE OF RECTUM AND ANUS 12/09/2008  . IRON DEFICIENCY ANEMIA SECONDARY TO BLOOD LOSS 11/29/2008  . CAD, UNSPECIFIED SITE 10/14/2008  . GERD 10/14/2008  . CHEST PAIN UNSPECIFIED 09/09/2008  . DIVERTICULOSIS OF  COLON 04/03/2008    Past Medical History  Diagnosis Date  . Hyperlipidemia   . Diverticulosis 7/23-28/10    Lower GI diverticular bleed felt due to divertic acute fe def anemia   . GERD (gastroesophageal reflux disease)   . Ejection fraction < 50% 5/10    LHC: 30-40% mid RCA stenosis, no obstructive disease. EF 55%  . Plantar fascia syndrome   . Hiatal hernia     Past Surgical History  Procedure Laterality Date  . Appendectomy      high school  . Colonoscopy  10/23/07    divertics, Fuller Plan), negative polyps (1st= ~04; 2nd =~14)  . Cardiac catheterization  5/3-4/10    [facility] CP cath (Dr. [name]). cath 20% stenosis prox LAD EF 55%-09/10/08  . Hosp [facility]  12/16-18/11    presumed lower GI bleed Divert     History   Social History  . Marital Status: Married    Spouse Name: N/A  . Number of Children: 0  . Years of Education: N/A   Occupational History  . truck driver- retired    Social History Main Topics  . Smoking status: Never Smoker   . Smokeless tobacco: Never Used     Comment: tobacco use - no  . Alcohol Use: Yes     Comment: rarely  . Drug Use: No  . Sexual Activity: Not on file   Other Topics Concern  . Not on file   Social History Narrative   Married 2003, no children. Retired Administrator, gets regular exercise- walking.    Step daughter is MD in [place]    Family History  Problem Relation Age of Onset  . Diabetes  Neg Hx     DM  . Depression Neg Hx   . Stroke Neg Hx   . Alcohol abuse Neg Hx     also no drug abuse   . Prostate cancer Neg Hx   . Colon polyps Brother   . Colon cancer Mother 76  . Lung cancer Brother   . Heart disease Sister     MI, pace maker  . Kidney disease Sister     renal failure, nephrectomy- not cancer  . Hypertension Sister   . Hypertension Sister     No Known Allergies  Medication list reviewed and updated in full in [facility].  GEN: No fevers, chills. Nontoxic. Primarily MSK c/o today. MSK: Detailed in the  HPI GI: tolerating PO intake without difficulty Neuro: No numbness, parasthesias, or tingling associated. Otherwise the pertinent positives of the ROS are noted above.   Objective:   BP 130/70 mmHg  Pulse 64  Temp(Src) 97.6 F (36.4 C) (Oral)  Ht 6' (1.829 m)  Wt 198 lb 8 oz (90.039 kg)  BMI 26.92 kg/m2   GEN: WDWN, NAD, Non-toxic, Alert & Oriented x 3 HEENT: Atraumatic, Normocephalic.  Ears and Nose: No external deformity. EXTR: No clubbing/cyanosis/edema NEURO: Normal gait.  PSYCH: Normally interactive. Conversant. Not depressed or anxious appearing.  Calm demeanor.   FEET: B Echymosis: no Edema: no ROM: full LE B Gait: heel toe, non-antalgic MT pain: no Callus pattern: none Lateral Mall: NT Medial Mall: NT Talus: NT Navicular: NT Cuboid: NT Calcaneous: NT Metatarsals: NT 5th MT: NT Phalanges: NT Achilles: NT Plantar Fascia: TTP B Fat Pad: NT Peroneals: NT Post Tib: NT Great Toe: Nml motion Ant Drawer: neg ATFL: NT CFL: NT Deltoid: NT Other foot breakdown: none Long arch: moderate breakdown Transverse arch: mod breakdown with bunion / bunionette formation Hindfoot breakdown: none Sensation: intact   Radiology: No results found.  Assessment and Plan:   Plantar fascia syndrome  Bunion of great toe, unspecified laterality  >40 minutes spent in face to face time with patient, >50% spent in counselling or coordination of care: Additional discussion and coordination of care regarding gait, foot wear, care of plantar fasciitis, and gait analysis. Multiple review articles show that custom orthotics can help with plantar fasciitis.  Patient was fitted for a standard, cushioned, semi-rigid orthotic.  The orthotic was heated and the patient stood on the orthotic blank positioned on the orthotic stand. The patient was positioned in subtalar neutral position and 10 degrees of ankle dorsiflexion in a weight bearing stance. After molding, a stable Fast-Tech EVA  base was applied to the orthotic blank.   The blank was ground to a stable position for weight bearing. Size: 13 base: green F-4 posting: none additional orthotic padding: none   I appreciate the opportunity to evaluate this very friendly patient. If you have any question regarding her care or prognosis, do not hesitate to ask.   Follow-up: prn  New Prescriptions   No medications on file   No orders of the defined types were placed in this encounter.    Signed,  Maud Deed. Tyre Beaver, MD   Patient's Medications  New Prescriptions   No medications on file  Previous Medications   ASCORBIC ACID (VITAMIN C) 1000 MG TABLET    Take 1,000 mg by mouth daily.   ATORVASTATIN (LIPITOR) 40 MG TABLET    Take 0.5 tablets (20 mg total) by mouth daily.   CALCIUM CARBONATE (TUMS) 500 MG CHEWABLE TABLET  Chew 1 tablet by mouth as needed.     CARVEDILOL (COREG) 3.125 MG TABLET    Take 1 tablet (3.125 mg total) by mouth 2 (two) times daily.   CIPROFLOXACIN (CIPRO) 500 MG TABLET    Take 1 tablet (500 mg total) by mouth 2 (two) times daily. For up to 3 days if needed for traveler's diarrhea.   FISH OIL-OMEGA-3 FATTY ACIDS 1000 MG CAPSULE    Take 1 g by mouth daily.   LORATADINE (CLARITIN) 10 MG TABLET    Take 1 tablet (10 mg total) by mouth daily.   LOSARTAN (COZAAR) 25 MG TABLET    Take 1 tablet (25 mg total) by mouth daily.   MULTIPLE VITAMIN (MULTIVITAMIN) TABLET    Take 1 tablet by mouth daily.   OMEPRAZOLE (PRILOSEC) 20 MG CAPSULE    Take 1 capsule (20 mg total) by mouth 2 (two) times daily.   TYPHOID (VIVOTIF) DR CAPSULE    Take 1 capsule by mouth every other day.  Modified Medications   No medications on file  Discontinued Medications   No medications on file

## 2014-11-18 ENCOUNTER — Ambulatory Visit (INDEPENDENT_AMBULATORY_CARE_PROVIDER_SITE_OTHER): Payer: Medicare Other | Admitting: Family Medicine

## 2014-11-18 ENCOUNTER — Encounter: Payer: Self-pay | Admitting: Family Medicine

## 2014-11-18 VITALS — BP 112/60 | HR 66 | Temp 98.5°F | Wt 202.8 lb

## 2014-11-18 DIAGNOSIS — Z125 Encounter for screening for malignant neoplasm of prostate: Secondary | ICD-10-CM

## 2014-11-18 DIAGNOSIS — R399 Unspecified symptoms and signs involving the genitourinary system: Secondary | ICD-10-CM

## 2014-11-18 LAB — PSA, MEDICARE: PSA: 1.01 ng/ml (ref 0.10–4.00)

## 2014-11-18 MED ORDER — TAMSULOSIN HCL 0.4 MG PO CAPS: 0.4000 mg | ORAL_CAPSULE | Freq: Every day | ORAL | Status: DC

## 2014-11-18 NOTE — Patient Instructions (Signed)
Go to the lab on the way out.  We'll contact you with your lab report. Try taking flomax and update me if needed about your urinary symptoms. Take care.  Glad to see you.

## 2014-11-18 NOTE — Progress Notes (Signed)
Pre visit review using our clinic review tool, if applicable. No additional management support is needed unless otherwise documented below in the visit note.  His foot pain is better with inserts.    Recently with urination sx.  His stream was decreased intermittently.  He occ has to get up at night to urinate, not always.  No burning with urination.  No abd pain.  He improved after drinking a dark soda with caffeine.  Better still in the meantime still.  No groin pain. H/o normal PSAs noted.  Rarely drinks soda usually.    Other docs had noted prostate enlargement in the distant past per patient report.    Meds, vitals, and allergies reviewed.   ROS: See HPI.  Otherwise, noncontributory.  nad ncat rrr ctab abd soft, not ttp

## 2014-11-19 DIAGNOSIS — R399 Unspecified symptoms and signs involving the genitourinary system: Secondary | ICD-10-CM | POA: Insufficient documentation

## 2014-11-19 NOTE — Assessment & Plan Note (Signed)
Other docs had noted prostate enlargement in the distant past per patient report.  Reasonable to try flomax to see his response.  D/w pt about avoiding caffeine.   He agrees.  Path/phys of prostate enlargement d/w pt.

## 2014-11-20 ENCOUNTER — Telehealth: Payer: Self-pay | Admitting: Gastroenterology

## 2014-11-20 ENCOUNTER — Telehealth: Payer: Self-pay | Admitting: General Practice

## 2014-11-20 NOTE — Telephone Encounter (Signed)
Patient is c/o black stools and very "foul smelling".  He reports he has a history of a GI bleed several years ago and "this is how it started".  He denies any frank blood, diarrhea, or constipation.  He will come in and see Arta Bruce, PA tomorrow at [employer]

## 2014-11-20 NOTE — Telephone Encounter (Signed)
Error

## 2014-11-21 ENCOUNTER — Other Ambulatory Visit (INDEPENDENT_AMBULATORY_CARE_PROVIDER_SITE_OTHER): Payer: Medicare Other

## 2014-11-21 ENCOUNTER — Ambulatory Visit (INDEPENDENT_AMBULATORY_CARE_PROVIDER_SITE_OTHER): Payer: Medicare Other | Admitting: Physician Assistant

## 2014-11-21 ENCOUNTER — Encounter: Payer: Self-pay | Admitting: Physician Assistant

## 2014-11-21 VITALS — BP 116/70 | HR 68 | Ht 70.75 in | Wt 196.1 lb

## 2014-11-21 DIAGNOSIS — K921 Melena: Secondary | ICD-10-CM

## 2014-11-21 LAB — CBC WITH DIFFERENTIAL/PLATELET
Basophils Absolute: 0.2 10*3/uL — ABNORMAL HIGH (ref 0.0–0.1)
Basophils Relative: 2.2 % (ref 0.0–3.0)
EOS ABS: 0.1 10*3/uL (ref 0.0–0.7)
Eosinophils Relative: 1.1 % (ref 0.0–5.0)
HCT: 42.6 % (ref 39.0–52.0)
HEMOGLOBIN: 13.9 g/dL (ref 13.0–17.0)
LYMPHS PCT: 36.4 % (ref 12.0–46.0)
Lymphs Abs: 2.9 10*3/uL (ref 0.7–4.0)
MCHC: 32.7 g/dL (ref 30.0–36.0)
MCV: 82.8 fl (ref 78.0–100.0)
MONOS PCT: 5.6 % (ref 3.0–12.0)
Monocytes Absolute: 0.4 10*3/uL (ref 0.1–1.0)
NEUTROS ABS: 4.3 10*3/uL (ref 1.4–7.7)
Neutrophils Relative %: 54.7 % (ref 43.0–77.0)
PLATELETS: 287 10*3/uL (ref 150.0–400.0)
RBC: 5.15 Mil/uL (ref 4.22–5.81)
RDW: 16.3 % — ABNORMAL HIGH (ref 11.5–15.5)
WBC: 7.9 10*3/uL (ref 4.0–10.5)

## 2014-11-21 NOTE — Progress Notes (Signed)
Reviewed and agree with management plan.  Reianna Batdorf T. Nachum Derossett, MD FACG 

## 2014-11-21 NOTE — Progress Notes (Signed)
Patient ID: Paul Fry, male   DOB: Nov 19, 1945, 69 y.o.   MRN: [id number]     History of Present Illness: Paul Fry is a pleasant 69 year old male with a history of GERD. He is known to Dr. Fuller Plan. 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 He had a cath in May 2010 with nonobstructive CAD. He had atypical chest pain in March 2013 and ETT-Myoview was done showing EF 47%, no ischemia or infarction. Normal EF he had an echo done which showed EF 50-55% with mild LVH. He has a history of GERD and has been having some reflux-like symptoms after eating certain foods. He does have some nocturnal regurgitation. He has been using omeprazole twice daily. He also has a history of plantar fasciitis, hyperlipidemia, diverticulosis with diverticular bleeding 2, and type 2 diabetes. His last colonoscopy was in August 2014 at which time a sessile polyp was removed. Pathology revealed a tubular adenoma and he was advised to have surveillance in 5 years. He had an upper endoscopy 06/16/2011 which was normal aside from a hiatal hernia.  He states that on Sunday, 11/17/2014 he felt very gassy and bloated and had several jet black sticky stools that smelled very bad. Monday he again had 2 or 3 black bowel movements. Tuesday he had 2 dark bowel movements, Wednesday he had 2 black bowel movements, this morning he had one dark bowel movement. He has no abdominal pain. He has no epigastric pain, nausea, or vomiting. He denies use of non-steroidal anti-inflammatory's. He has no dizziness or lightheadedness.   Past Medical History  Diagnosis Date  . Hyperlipidemia   . Diverticulosis 7/23-28/10    Lower GI diverticular bleed felt due to divertic acute fe def anemia   . GERD (gastroesophageal reflux disease)   . Ejection fraction < 50% 5/10    LHC: 30-40% mid RCA stenosis, no obstructive disease. EF 55%  . Plantar fascia syndrome   . Hiatal hernia     Past Surgical History  Procedure  Laterality Date  . Appendectomy      high school  . Colonoscopy  10/23/07    divertics, Fuller Plan), negative polyps (1st= ~04; 2nd =~14)  . Cardiac catheterization  5/3-4/10    [facility] CP cath (Dr. [name]). cath 20% stenosis prox LAD EF 55%-09/10/08  . [facility]  12/16-18/11    presumed lower GI bleed Divert    Family History  Problem Relation Age of Onset  . Diabetes Neg Hx     DM  . Depression Neg Hx   . Stroke Neg Hx   . Alcohol abuse Neg Hx     also no drug abuse   . Prostate cancer Neg Hx   . Colon polyps Brother   . Colon cancer Mother 65  . Lung cancer Brother   . Heart disease Sister     MI, pace maker  . Kidney disease Sister     renal failure, nephrectomy- not cancer  . Hypertension Sister   . Hypertension Sister    History  Substance Use Topics  . Smoking status: Never Smoker   . Smokeless tobacco: Never Used     Comment: tobacco use - no  . Alcohol Use: Yes     Comment: rarely   Current Outpatient Prescriptions  Medication Sig Dispense Refill  . ascorbic acid (VITAMIN C) 1000 MG tablet Take 1,000 mg by mouth daily.    [name] atorvastatin (LIPITOR) 40 MG tablet Take 0.5 tablets (20 mg total) by  mouth daily. 45 tablet 3  . calcium carbonate (TUMS) 500 MG chewable tablet Chew 1 tablet by mouth as needed.      . carvedilol (COREG) 3.125 MG tablet Take 1 tablet (3.125 mg total) by mouth 2 (two) times daily. 180 tablet 3  . ciprofloxacin (CIPRO) 500 MG tablet Take 1 tablet (500 mg total) by mouth 2 (two) times daily. For up to 3 days if needed for traveler's diarrhea. 12 tablet 0  . fish oil-omega-3 fatty acids 1000 MG capsule Take 1 g by mouth daily.    [name] loratadine (CLARITIN) 10 MG tablet Take 1 tablet (10 mg total) by mouth daily.    [name] losartan (COZAAR) 25 MG tablet Take 1 tablet (25 mg total) by mouth daily. 90 tablet 3  . Multiple Vitamin (MULTIVITAMIN) tablet Take 1 tablet by mouth daily.    [name] omeprazole (PRILOSEC) 20 MG capsule Take 1 capsule (20 mg total) by mouth 2  (two) times daily. 180 capsule 3  . tamsulosin (FLOMAX) 0.4 MG CAPS capsule Take 1 capsule (0.4 mg total) by mouth daily. 90 capsule 1  . typhoid (VIVOTIF) DR capsule Take 1 capsule by mouth every other day. 4 capsule 0   No current facility-administered medications for this visit.   No Known Allergies    Review of Systems: Gen: Denies any fever, chills, sweats, anorexia, fatigue, weakness, malaise, weight loss, and sleep disorder CV: Denies chest pain, angina, palpitations, syncope, orthopnea, PND, peripheral edema, and claudication. Resp: Denies dyspnea at rest, dyspnea with exercise, cough, sputum, wheezing, coughing up blood, and pleurisy. GI: Denies vomiting blood, jaundice, and fecal incontinence.   Denies dysphagia or odynophagia. GU : Denies urinary burning, blood in urine, urinary frequency, urinary hesitancy, nocturnal urination, and urinary incontinence. MS: Denies joint pain, limitation of movement, and swelling, stiffness, low back pain, extremity pain. Denies muscle weakness, cramps, atrophy.  Derm: Denies rash, itching, dry skin, hives, moles, warts, or unhealing ulcers.  Psych: Denies depression, anxiety, memory loss, suicidal ideation, hallucinations, paranoia, and confusion. Heme: Denies bruising, bleeding, and enlarged lymph nodes. Neuro:  Denies any headaches, dizziness, paresthesia Endo:  Denies any problems with DM, thyroid, adrenal   Physical Exam: General: Pleasant, well developed  African-American male in no acute distress Head: Normocephalic and atraumatic Eyes:  sclerae anicteric, conjunctiva pink  Ears: Normal auditory acuity Lungs: Clear throughout to auscultation Heart: Regular rate and rhythm Abdomen: Soft, non distended, non-tender. No masses, no hepatomegaly. Normal bowel sounds Rectal: Streaks of black stool that test heme positive Musculoskeletal: Symmetrical with no gross deformities  Extremities: No edema  Neurological: Alert oriented x 4,  grossly nonfocal Psychological:  Alert and cooperative. Normal mood and affect  Assessment and Recommendations: 69 year old male with a history of GERD, presenting with several days of melena that seems to be slowing. A CBC will be obtained today, and the patient will be scheduled for an EGD to evaluate for gastritis, ulcer, duodenitis etc.The risks, benefits, and alternatives to endoscopy with possible biopsy and possible dilation were discussed with the patient and they consent to proceed. Patient has been instructed to continue his omeprazole 20 mg twice a day. Further recommendations will be made pending the findings of the above.        Allyce Bochicchio, Deloris Ping 11/21/2014,

## 2014-11-21 NOTE — Patient Instructions (Addendum)
You have been scheduled for an endoscopy. Please follow written instructions given to you at your visit today. If you use inhalers (even only as needed), please bring them with you on the day of your procedure.   Your physician has requested that you go to the basement for lab work before leaving today.  We will call and confirm your appointment as soon as Dr Fuller Plan responds.  If he cannot Dr Carlean Purl will do the procedure. Patient number [id number]

## 2014-11-22 ENCOUNTER — Encounter: Payer: Self-pay | Admitting: Gastroenterology

## 2014-11-22 ENCOUNTER — Ambulatory Visit (AMBULATORY_SURGERY_CENTER): Payer: Medicare Other | Admitting: Gastroenterology

## 2014-11-22 VITALS — BP 130/73 | HR 67 | Temp 97.7°F | Resp 13 | Ht 70.75 in | Wt 196.0 lb

## 2014-11-22 DIAGNOSIS — K2970 Gastritis, unspecified, without bleeding: Secondary | ICD-10-CM

## 2014-11-22 DIAGNOSIS — K921 Melena: Secondary | ICD-10-CM

## 2014-11-22 DIAGNOSIS — K2950 Unspecified chronic gastritis without bleeding: Secondary | ICD-10-CM | POA: Diagnosis not present

## 2014-11-22 DIAGNOSIS — I2510 Atherosclerotic heart disease of native coronary artery without angina pectoris: Secondary | ICD-10-CM | POA: Diagnosis not present

## 2014-11-22 DIAGNOSIS — I428 Other cardiomyopathies: Secondary | ICD-10-CM | POA: Diagnosis not present

## 2014-11-22 DIAGNOSIS — B9681 Helicobacter pylori [H. pylori] as the cause of diseases classified elsewhere: Secondary | ICD-10-CM | POA: Diagnosis not present

## 2014-11-22 DIAGNOSIS — I10 Essential (primary) hypertension: Secondary | ICD-10-CM | POA: Diagnosis not present

## 2014-11-22 DIAGNOSIS — E669 Obesity, unspecified: Secondary | ICD-10-CM | POA: Diagnosis not present

## 2014-11-22 MED ORDER — SODIUM CHLORIDE 0.9 % IV SOLN: 500.0000 mL | INTRAVENOUS | Status: DC

## 2014-11-22 NOTE — Progress Notes (Signed)
Report to PACU, RN, vss, BBS= Clear.  

## 2014-11-22 NOTE — Patient Instructions (Signed)
YOU HAD AN ENDOSCOPIC PROCEDURE TODAY AT [facility]:   Refer to the procedure report that was given to you for any specific questions about what was found during the examination.  If the procedure report does not answer your questions, please call your gastroenterologist to clarify.  If you requested that your care partner not be given the details of your procedure findings, then the procedure report has been included in a sealed envelope for you to review at your convenience later.  YOU SHOULD EXPECT: Some feelings of bloating in the abdomen. Passage of more gas than usual.  Walking can help get rid of the air that was put into your GI tract during the procedure and reduce the bloating. If you had a lower endoscopy (such as a colonoscopy or flexible sigmoidoscopy) you may notice spotting of blood in your stool or on the toilet paper. If you underwent a bowel prep for your procedure, you may not have a normal bowel movement for a few days.  Please Note:  You might notice some irritation and congestion in your nose or some drainage.  This is from the oxygen used during your procedure.  There is no need for concern and it should clear up in a day or so.  SYMPTOMS TO REPORT IMMEDIATELY:   Following upper endoscopy (EGD)  Vomiting of blood or coffee ground material  New chest pain or pain under the shoulder blades  Painful or persistently difficult swallowing  New shortness of breath  Fever of 100F or higher  Black, tarry-looking stools  For urgent or emergent issues, a gastroenterologist can be reached at any hour by calling [phone].   DIET: Your first meal following the procedure should be a small meal and then it is ok to progress to your normal diet. Heavy or fried foods are harder to digest and may make you feel nauseous or bloated.  Likewise, meals heavy in dairy and vegetables can increase bloating.  Drink plenty of fluids but you should avoid alcoholic beverages for  24 hours.  ACTIVITY:  You should plan to take it easy for the rest of today and you should NOT DRIVE or use heavy machinery until tomorrow (because of the sedation medicines used during the test).    FOLLOW UP: Our staff will call the number listed on your records the next business day following your procedure to check on you and address any questions or concerns that you may have regarding the information given to you following your procedure. If we do not reach you, we will leave a message.  However, if you are feeling well and you are not experiencing any problems, there is no need to return our call.  We will assume that you have returned to your regular daily activities without incident.  If any biopsies were taken you will be contacted by phone or by letter within the next 1-3 weeks.  Please call us at [phone] if you have not heard about the biopsies in 3 weeks.    SIGNATURES/CONFIDENTIALITY: You and/or your care partner have signed paperwork which will be entered into your electronic medical record.  These signatures attest to the fact that that the information above on your After Visit Summary has been reviewed and is understood.  Full responsibility of the confidentiality of this discharge information lies with you and/or your care-partner.  Gastritis and hiatal hernia information given.  See Dr. Fuller Plan in 4-6 weeks.  Await biopsy results.

## 2014-11-22 NOTE — Op Note (Signed)
[facility]  [address] [place], [address]   ENDOSCOPY PROCEDURE REPORT  PATIENT: Paul Fry, Paul Fry  MR#: [id number] BIRTHDATE: 1945/09/06 , 72  yrs. old GENDER: male ENDOSCOPIST: Ladene Artist, MD, [facility] [R PROCEDURE DATE:  11/22/2014 PROCEDURE:  EGD w/ biopsy ASA CLASS:     Class III INDICATIONS:  melena. MEDICATIONS: Monitored anesthesia care and Propofol 170 mg IV TOPICAL ANESTHETIC: none DESCRIPTION OF PROCEDURE: After the risks benefits and alternatives of the procedure were thoroughly explained, informed consent was obtained.  The LB [id number] endoscope was introduced through the mouth and advanced to the third portion of the duodenum , Without limitations.  The instrument was slowly withdrawn as the mucosa was fully examined.    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  The esophagus was otherwise unremarkable. STOMACH: Mild nonerosive gastritis was found in the gastric fundus and gastric body.  Multiple biopsies were performed.   The stomach otherwise appeared normal. DUODENUM: The duodenal mucosa showed no abnormalities in the 3rd part of the duodenum and bulb and 2nd part duodenum.  Retroflexed views revealed a small hiatal hernia.     The scope was then withdrawn from the patient and the procedure completed.  COMPLICATIONS: There were no immediate complications.  ENDOSCOPIC IMPRESSION: 1.   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   Mild gastritis in the gastric fundus and gastric body; multiple biopsies performed 3.   Small hiatal hernia  RECOMMENDATIONS: 1.  OP follow-up in 4-6 weeks 2.  Await pathology    [R  eSigned:  Ladene Artist, MD, [facility] 11/22/2014 3:33 PM

## 2014-11-22 NOTE — Progress Notes (Signed)
Called to room to assist during endoscopic procedure.  Patient ID and intended procedure confirmed with present staff. Received instructions for my participation in the procedure from the performing physician.  

## 2014-11-25 ENCOUNTER — Telehealth: Payer: Self-pay

## 2014-11-25 NOTE — Telephone Encounter (Signed)
  Follow up Call-  Call back number 11/22/2014 12/21/2012  Post procedure Call Back phone  # [phone]  Permission to leave phone message Yes Yes     Patient questions:  Do you have a fever, pain , or abdominal swelling? No. Pain Score  0 *  Have you tolerated food without any problems? Yes.    Have you been able to return to your normal activities? Yes.    Do you have any questions about your discharge instructions: Diet   No. Medications  No. Follow up visit  No.  Do you have questions or concerns about your Care? No.  Actions: * If pain score is 4 or above: No action needed, pain <4.  Per the pt he said is lips were " a little sore"  Where he had the bite block in his mouth.  No swelling or bleeding noted per the pt.  I advised him to give [place] call back if it does not resolve in next couple of days.  The pt said he would. maw

## 2014-11-29 ENCOUNTER — Other Ambulatory Visit: Payer: Self-pay

## 2014-11-29 ENCOUNTER — Telehealth: Payer: Self-pay | Admitting: *Deleted

## 2014-11-29 DIAGNOSIS — K2970 Gastritis, unspecified, without bleeding: Secondary | ICD-10-CM

## 2014-11-29 MED ORDER — BIS SUBCIT-METRONID-TETRACYC 140-125-125 MG PO CAPS: ORAL_CAPSULE | ORAL | Status: DC

## 2014-11-29 MED ORDER — METRONIDAZOLE 250 MG PO TABS: 250.0000 mg | ORAL_TABLET | Freq: Two times a day (BID) | ORAL | Status: DC

## 2014-11-29 MED ORDER — DOXYCYCLINE HYCLATE 50 MG PO CAPS: 100.0000 mg | ORAL_CAPSULE | Freq: Two times a day (BID) | ORAL | Status: DC

## 2014-11-29 MED ORDER — BISMUTH SUBSALICYLATE 262 MG PO CHEW: 262.0000 mg | CHEWABLE_TABLET | Freq: Two times a day (BID) | ORAL | Status: DC

## 2014-11-29 NOTE — Telephone Encounter (Signed)
Patient notified that RX. Sent into pharmacy at [facility] in [place]. Instruction for pt. Reviewed to take pylera 3 qid x10days and take his ppi bid x10days and then decrease to q a.m. Pt. Verbalize understanding.

## 2015-02-18 ENCOUNTER — Other Ambulatory Visit: Payer: Medicare Other

## 2015-02-21 ENCOUNTER — Ambulatory Visit: Payer: Medicare Other | Admitting: Family Medicine

## 2015-03-07 ENCOUNTER — Telehealth: Payer: Self-pay

## 2015-03-07 NOTE — Telephone Encounter (Signed)
Pt called and stated he has lost the foot pads you had given him previously for plantar fasciitis and wants to know if he cn get some more---please advise

## 2015-03-07 NOTE — Telephone Encounter (Signed)
noted 

## 2015-03-07 NOTE — Telephone Encounter (Signed)
Spoke with Paul Fry.  He misplaced one of his orthotics.  Advised insurance would only cover one pair of custom orthotics every 12 months.  He states he will hold off for now and hopefully it will turn up somewhere.

## 2015-03-07 NOTE — Telephone Encounter (Signed)
Can you clarify his question? I am not sure what he is taking about.   I made him some orthotics on 11/2014. If he lost those, then medicare will not allow him to have another custom pair made until 12 months after his prior pair.   Electronically Signed  By: Owens Loffler, MD On: 03/07/2015 3:51 PM

## 2015-05-24 ENCOUNTER — Other Ambulatory Visit: Payer: Self-pay | Admitting: Family Medicine

## 2015-07-06 ENCOUNTER — Other Ambulatory Visit: Payer: Self-pay | Admitting: Family Medicine

## 2015-07-06 DIAGNOSIS — Z8639 Personal history of other endocrine, nutritional and metabolic disease: Secondary | ICD-10-CM

## 2015-07-06 DIAGNOSIS — I10 Essential (primary) hypertension: Secondary | ICD-10-CM

## 2015-07-11 ENCOUNTER — Other Ambulatory Visit (INDEPENDENT_AMBULATORY_CARE_PROVIDER_SITE_OTHER): Payer: Medicare Other

## 2015-07-11 DIAGNOSIS — I10 Essential (primary) hypertension: Secondary | ICD-10-CM

## 2015-07-11 DIAGNOSIS — Z8639 Personal history of other endocrine, nutritional and metabolic disease: Secondary | ICD-10-CM | POA: Diagnosis not present

## 2015-07-11 LAB — COMPREHENSIVE METABOLIC PANEL
ALK PHOS: 52 U/L (ref 39–117)
ALT: 24 U/L (ref 0–53)
AST: 23 U/L (ref 0–37)
Albumin: 3.9 g/dL (ref 3.5–5.2)
BILIRUBIN TOTAL: 1 mg/dL (ref 0.2–1.2)
BUN: 10 mg/dL (ref 6–23)
CO2: 30 meq/L (ref 19–32)
CREATININE: 0.95 mg/dL (ref 0.40–1.50)
Calcium: 9.2 mg/dL (ref 8.4–10.5)
Chloride: 104 mEq/L (ref 96–112)
GFR: 100.9 mL/min (ref >60.00)
GLUCOSE: 112 mg/dL — AB (ref 70–99)
Potassium: 4.5 mEq/L (ref 3.5–5.1)
Sodium: 140 mEq/L (ref 135–145)
TOTAL PROTEIN: 7.1 g/dL (ref 6.0–8.3)

## 2015-07-11 LAB — HEMOGLOBIN A1C: HEMOGLOBIN A1C: 6.2 % (ref 4.6–6.5)

## 2015-07-11 LAB — LIPID PANEL
CHOL/HDL RATIO: 3
Cholesterol: 113 mg/dL (ref 0–200)
HDL: 32.6 mg/dL — AB (ref >39.00)
LDL Cholesterol: 66 mg/dL (ref 0–99)
NonHDL: 80.66
Triglycerides: 73 mg/dL (ref 0.0–149.0)
VLDL: 14.6 mg/dL (ref 0.0–40.0)

## 2015-07-17 ENCOUNTER — Ambulatory Visit (INDEPENDENT_AMBULATORY_CARE_PROVIDER_SITE_OTHER): Payer: Medicare Other | Admitting: Family Medicine

## 2015-07-17 ENCOUNTER — Encounter: Payer: Self-pay | Admitting: Family Medicine

## 2015-07-17 VITALS — BP 102/66 | HR 76 | Temp 98.6°F | Ht 71.0 in | Wt 199.8 lb

## 2015-07-17 DIAGNOSIS — Z119 Encounter for screening for infectious and parasitic diseases, unspecified: Secondary | ICD-10-CM

## 2015-07-17 DIAGNOSIS — I429 Cardiomyopathy, unspecified: Secondary | ICD-10-CM

## 2015-07-17 DIAGNOSIS — Z23 Encounter for immunization: Secondary | ICD-10-CM

## 2015-07-17 DIAGNOSIS — Z0000 Encounter for general adult medical examination without abnormal findings: Secondary | ICD-10-CM | POA: Diagnosis not present

## 2015-07-17 DIAGNOSIS — R739 Hyperglycemia, unspecified: Secondary | ICD-10-CM

## 2015-07-17 DIAGNOSIS — K219 Gastro-esophageal reflux disease without esophagitis: Secondary | ICD-10-CM

## 2015-07-17 DIAGNOSIS — I10 Essential (primary) hypertension: Secondary | ICD-10-CM

## 2015-07-17 DIAGNOSIS — E7800 Pure hypercholesterolemia, unspecified: Secondary | ICD-10-CM

## 2015-07-17 MED ORDER — CARVEDILOL 3.125 MG PO TABS: 3.1250 mg | ORAL_TABLET | Freq: Two times a day (BID) | ORAL | Status: DC

## 2015-07-17 MED ORDER — LOSARTAN POTASSIUM 25 MG PO TABS: 25.0000 mg | ORAL_TABLET | Freq: Every day | ORAL | Status: DC

## 2015-07-17 MED ORDER — TAMSULOSIN HCL 0.4 MG PO CAPS: 0.4000 mg | ORAL_CAPSULE | Freq: Every day | ORAL | Status: DC

## 2015-07-17 MED ORDER — OMEPRAZOLE 20 MG PO CPDR: 20.0000 mg | DELAYED_RELEASE_CAPSULE | Freq: Two times a day (BID) | ORAL | Status: DC

## 2015-07-17 MED ORDER — ATORVASTATIN CALCIUM 20 MG PO TABS: 20.0000 mg | ORAL_TABLET | Freq: Every day | ORAL | Status: DC

## 2015-07-17 NOTE — Progress Notes (Signed)
Pre visit review using our clinic review tool, if applicable. No additional management support is needed unless otherwise documented below in the visit note.  I have personally reviewed the Medicare Annual Wellness questionnaire and have noted 1. The patient's medical and social history 2. Their use of alcohol, tobacco or illicit drugs 3. Their current medications and supplements 4. The patient's functional ability including ADL's, fall risks, home safety risks and hearing or visual             impairment. 5. Diet and physical activities 6. Evidence for depression or mood disorders  The patients weight, height, BMI have been recorded in the chart and visual acuity is per eye clinic.  I have made referrals, counseling and provided education to the patient based review of the above and I have provided the pt with a written personalized care plan for preventive services.  Provider list updated- see scanned forms.  Routine anticipatory guidance given to patient.  See health maintenance.  Flu encouraged for fall 2017 Shingles 2013 PNA 2017 Tetanus 2010 Colonoscopy 2014 Prostate cancer screening PSA wnl <1 year ago, d/w pt Advance directive- wife designated if patient were incapacitated.   Cognitive function addressed- see scanned forms- and if abnormal then additional documentation follows.  Pt opts in for HCV screening.  D/w pt re: routine screening.    Hypertension:    Using medication without problems or lightheadedness: yes Chest pain with exertion:no Edema:no Short of breath:no  Elevated Cholesterol: Using medications without problems:yes Muscle aches: no Diet compliance:yes Exercise:yes  Hyperglycemia.  D/w pt.  Not Dm2 by A1c.    GERD controlled by PPI with rare breakthrough sx.  D/w pt about diet and exercise.  Cr wnl.  D/w pt.   PMH and SH reviewed  Meds, vitals, and allergies reviewed.   ROS: See HPI.  Otherwise negative.    GEN: nad, alert and oriented HEENT:  mucous membranes moist NECK: supple w/o LA CV: rrr. PULM: ctab, no inc wob ABD: soft, +bs EXT: no edema SKIN: no acute rash

## 2015-07-17 NOTE — Assessment & Plan Note (Signed)
Doing well with statin, continue as is.  Continue work on [employer] and diet.  Labs d/w pt.

## 2015-07-17 NOTE — Assessment & Plan Note (Signed)
Doing well with current meds, continue as is.  Continue work on [employer] and diet.  Labs d/w pt.

## 2015-07-17 NOTE — Assessment & Plan Note (Signed)
Flu encouraged for fall 2017 Shingles 2013 PNA 2017 Tetanus 2010 Colonoscopy 2014 Prostate cancer screening PSA wnl <1 year ago, d/w pt Advance directive- wife designated if patient were incapacitated.   Cognitive function addressed- see scanned forms- and if abnormal then additional documentation follows.  Pt opts in for HCV screening.  D/w pt re: routine screening.

## 2015-07-17 NOTE — Assessment & Plan Note (Signed)
dw pt about diet and exercise, copy of eat right diet given to patient, discussed.  He agrees.

## 2015-07-17 NOTE — Patient Instructions (Signed)
Recheck in about 1 year, sooner if needed.  Work on your sugar in the meantime, use the eat right diet.  Take care. Glad to see you.  I would get a flu shot each fall.

## 2015-07-17 NOTE — Assessment & Plan Note (Signed)
Doing well with PPI, continue as is.  Continue work on [employer] and diet.

## 2015-09-05 DIAGNOSIS — Z0100 Encounter for examination of eyes and vision without abnormal findings: Secondary | ICD-10-CM | POA: Diagnosis not present

## 2015-09-05 DIAGNOSIS — Z961 Presence of intraocular lens: Secondary | ICD-10-CM | POA: Diagnosis not present

## 2016-01-09 ENCOUNTER — Ambulatory Visit: Payer: Medicare Other | Admitting: Cardiology

## 2016-01-19 ENCOUNTER — Ambulatory Visit: Payer: Medicare Other | Admitting: Cardiology

## 2016-01-20 ENCOUNTER — Ambulatory Visit (INDEPENDENT_AMBULATORY_CARE_PROVIDER_SITE_OTHER): Payer: Medicare Other | Admitting: Family Medicine

## 2016-01-20 ENCOUNTER — Encounter: Payer: Self-pay | Admitting: Family Medicine

## 2016-01-20 VITALS — BP 110/64 | HR 70 | Temp 98.4°F | Wt 208.8 lb

## 2016-01-20 DIAGNOSIS — Z125 Encounter for screening for malignant neoplasm of prostate: Secondary | ICD-10-CM

## 2016-01-20 DIAGNOSIS — R399 Unspecified symptoms and signs involving the genitourinary system: Secondary | ICD-10-CM | POA: Diagnosis not present

## 2016-01-20 DIAGNOSIS — N400 Enlarged prostate without lower urinary tract symptoms: Secondary | ICD-10-CM

## 2016-01-20 LAB — POC URINALSYSI DIPSTICK (AUTOMATED)
Bilirubin, UA: NEGATIVE
Blood, UA: NEGATIVE
Glucose, UA: NEGATIVE
KETONES UA: NEGATIVE
LEUKOCYTES UA: NEGATIVE
NITRITE UA: NEGATIVE
PH UA: 6.5
PROTEIN UA: NEGATIVE
Spec Grav, UA: 1.02
Urobilinogen, UA: 1

## 2016-01-20 LAB — PSA, MEDICARE: PSA: 1.22 ng/mL (ref 0.10–4.00)

## 2016-01-20 MED ORDER — TAMSULOSIN HCL 0.4 MG PO CAPS: 0.8000 mg | ORAL_CAPSULE | Freq: Every day | ORAL | 3 refills | Status: DC

## 2016-01-20 NOTE — Patient Instructions (Addendum)
Go to the lab on the way out.  We'll contact you with your lab report. Increase the flomax to 2 pills a day in the meantime.  Rosaria Ferries will call about your referral.  See her on the way out.  Take care.  Glad to see you.

## 2016-01-20 NOTE — Progress Notes (Signed)
Urine dribbling.  More frequency.  Still on flomax.  Sx worse in the last few months.  No burning with urination.  Some discharge.  No FCNAVD.  No testicle pain, no rash.  Monogamous.  He doesn't have sensation of compete voiding.  Requiring mult voids.  Prev PSA wnl.    He has had some nasal congestion and rhinorrhea.  Still on claritin.  D/w pt about not changing med given the LUTS sx he has, wouldn't add on decongestant at this point.  Will address LUTS first.  He agrees.    Meds, vitals, and allergies reviewed.   ROS: Per HPI unless specifically indicated in ROS section   GEN: nad, alert and oriented NECK: supple w/o LA CV: rrr PULM: ctab, no inc wob ABD: soft, +bs EXT: no edema SKIN: no acute rash Prostate gland firm and smooth, bilateral enlargement, w/o nodularity, tenderness, mass, asymmetry or induration.

## 2016-01-20 NOTE — Assessment & Plan Note (Signed)
He had done some better on flomax, with effect likely waning more recently.  D/w pt.  Recheck PSA today.  Refer to uro.  Inc flomax to 0.8mg  a day.  U/a unremarkable.

## 2016-01-20 NOTE — Progress Notes (Signed)
Pre visit review using our clinic review tool, if applicable. No additional management support is needed unless otherwise documented below in the visit note. 

## 2016-01-21 ENCOUNTER — Encounter: Payer: Self-pay | Admitting: Cardiology

## 2016-01-29 ENCOUNTER — Ambulatory Visit (INDEPENDENT_AMBULATORY_CARE_PROVIDER_SITE_OTHER): Payer: Medicare Other | Admitting: Cardiology

## 2016-01-29 ENCOUNTER — Encounter: Payer: Self-pay | Admitting: Cardiology

## 2016-01-29 VITALS — BP 122/72 | HR 54 | Ht 71.0 in | Wt 206.0 lb

## 2016-01-29 DIAGNOSIS — I429 Cardiomyopathy, unspecified: Secondary | ICD-10-CM

## 2016-01-29 DIAGNOSIS — E7800 Pure hypercholesterolemia, unspecified: Secondary | ICD-10-CM

## 2016-01-29 NOTE — Patient Instructions (Signed)
Medication Instructions:  Your physician recommends that you continue on your current medications as directed. Please refer to the Current Medication list given to you today.   Labwork: None   Testing/Procedures: None   Follow-Up: Your physician wants you to follow-up in: 1 year with Truitt Merle, NP (September 2018). You will receive a reminder letter in the mail two months in advance. If you don't receive a letter, please call our office to schedule the follow-up appointment.        If you need a refill on your cardiac medications before your next appointment, please call your pharmacy.

## 2016-01-31 NOTE — Progress Notes (Signed)
Patient ID: Paul Fry, male   DOB: 09-May-1946, 70 y.o.   MRN: [id number] PCP: Dr. 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  He had a cath in 5/10 with mild nonobstructive CAD.  He had atypical chest pain in 3/13 and ETT-myoview was done, showing EF 47%, no ischemia or infarction.  Given the abnormal EF, he had an echo done which showed EF 50-55% with mild LVH.  Cardiac MR was done in 4/14, showing EF 49% with distal septal and apical hypokinesis, no delayed enhancement.  Most recent echo in 2016 again showed EF 50-55%.   He seems to be doing well.  No exertional chest pain.  No exertional dyspnea, orthopnea, or PND.  No palpitations or lightheadedness.  He is not on ASA due to prior GI bleeding on aspirin.   Labs (1/13): K 4.3, creatinine 0.9, LDL 97, HDL 38 Labs (1/14): LDL 102, HDL 33 Labs (5/14): K 4.2, creatinine 0.5 Labs (3/16): LDL 87, creatinine 0.88 Labs (3/17): LDL 66, HDL 33, K 4.5, creatinine 1.0  PMH: 1. GERD 2. Plantar fasciitis 3. Hyperlipidemia 4. Diverticulosis with diverticular bleeding x 2.  Stopped ASA. 5. Left heart cath 5/10: EF 55%, 30-40% mid RCA.  6. Cardiomyopathy: Mild.  ETT-Myoview (3/13) with EF 47%, no ischemia or infarction.  Echo (3/13) with EF 50-55%, mild LVH, mild MR, mild AI.  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   - Echo (2016) with EF 50-55%, mild biatrial enlargement.  7. Type II diabetes  FH: No premature CAD.  SH: Nonsmoker, lives in [place]  ROS: All systems reviewed and negative except as per HPI.   Current Outpatient Prescriptions  Medication Sig Dispense Refill  . ascorbic acid (VITAMIN C) 1000 MG tablet Take 1,000 mg by mouth daily.    [name] atorvastatin (LIPITOR) 20 MG tablet Take 1 tablet (20 mg total) by mouth daily. 90 tablet 3  . calcium carbonate (TUMS) 500 MG chewable tablet Chew 1  tablet by mouth as needed. Reported on 07/17/2015    . carvedilol (COREG) 3.125 MG tablet Take 1 tablet (3.125 mg total) by mouth 2 (two) times daily. 180 tablet 3  . fish oil-omega-3 fatty acids 1000 MG capsule Take 1 g by mouth daily.    [name] loratadine (CLARITIN) 10 MG tablet Take 1 tablet (10 mg total) by mouth daily.    [name] losartan (COZAAR) 25 MG tablet Take 1 tablet (25 mg total) by mouth daily. 90 tablet 3  . Multiple Vitamin (MULTIVITAMIN) tablet Take 1 tablet by mouth daily.    [name] omeprazole (PRILOSEC) 20 MG capsule Take 1 capsule (20 mg total) by mouth 2 (two) times daily. 180 capsule 3  . tamsulosin (FLOMAX) 0.4 MG CAPS capsule Take 2 capsules (0.8 mg total) by mouth daily. 180 capsule 3   No current facility-administered medications for this visit.     BP 122/72   Pulse (!) 54   Ht 5\' 11"  (1.803 m)   Wt 206 lb (93.4 kg)   BMI 28.73 kg/m  General: NAD Neck: No JVD, no thyromegaly or thyroid nodule.  Lungs: Clear to auscultation bilaterally with normal respiratory effort. CV: Nondisplaced PMI.  Heart regular S1/S2, +S4, no murmur.  No peripheral edema.  No carotid bruit.  Normal pedal pulses.  Abdomen: Soft, nontender, no hepatosplenomegaly, no distention.  Neurologic: Alert and oriented x 3.  Psych: Normal  affect. Extremities: No clubbing or cyanosis.   Assessment/Plan:  1. Cardiomyopathy: NYHA class I. Mild cardiomyopathy in past, EF 49% on cardiac MRI in 4/14 and 50-55% on echo in 2016.  No delayed enhancement on MRI.  There was a suggestion of noncompaction towards the apex.  This may be the cause of his mild cardiomyopathy.  - Continue losartan and Coreg. 2. Hyperlipidemia: Good lipids when recently checked.   Given my transition to CHF clinic, he will see Truitt Merle in 1 year.    Loralie Champagne 01/31/2016

## 2016-02-05 ENCOUNTER — Encounter: Payer: Self-pay | Admitting: Family Medicine

## 2016-02-05 ENCOUNTER — Ambulatory Visit (INDEPENDENT_AMBULATORY_CARE_PROVIDER_SITE_OTHER): Payer: Medicare Other | Admitting: Family Medicine

## 2016-02-05 VITALS — BP 110/62 | HR 73 | Temp 98.4°F | Wt 204.5 lb

## 2016-02-05 DIAGNOSIS — R05 Cough: Secondary | ICD-10-CM | POA: Diagnosis not present

## 2016-02-05 DIAGNOSIS — R059 Cough, unspecified: Secondary | ICD-10-CM

## 2016-02-05 DIAGNOSIS — Z23 Encounter for immunization: Secondary | ICD-10-CM

## 2016-02-05 NOTE — Assessment & Plan Note (Signed)
With a lot of throat clearing, likely related to postnasal drip. Already on Claritin. Stop Claritin. Start Zyrtec. Avoid Zyrtec-D, which could increase his blood pressure. If needed can add on Flonase. Discussed with patient that some of his symptoms may be due to environmental allergy, otherwise some of his symptoms could be related to higher dose of Flomax. He will follow up with urology in the meantime. Update me as needed. Lungs are clear. No indication for antibiotics at this point. He agrees.

## 2016-02-05 NOTE — Progress Notes (Signed)
Recently with PSA wnl, has uro f/u pending.   On higher dose of flomax now.  occ will get lightheaded, when bending over and then standing up, slightly more noted since higher dose of flomax.  No syncope.    Cough.  Having to clear his throat a lot, occ yellow tinted.  He doesn't feel sick.  No rhinorrhea.  Nighttime sx are worse, with post nasal gtt.  No FCNAVD.  Noted UAN.  No ear pain.  He is on claritin at baseline.  He usually doesn't have allergy sx.  No animals at the house.  He has been changing his filters.    Meds, vitals, and allergies reviewed.   ROS: Per HPI unless specifically indicated in ROS section   GEN: nad, alert and oriented HEENT: mucous membranes moist, tm w/o erythema, nasal exam w/o erythema, clear discharge noted,  OP with cobblestoning NECK: supple w/o LA CV: rrr.   PULM: ctab, no inc wob EXT: no edema

## 2016-02-05 NOTE — Progress Notes (Signed)
Pre visit review using our clinic review tool, if applicable. No additional management support is needed unless otherwise documented below in the visit note. 

## 2016-02-05 NOTE — Patient Instructions (Signed)
Stop claritin.  Change to zyrtec.  Add on flonase if needed.  Keep the appointment with urology.  If you can get on a lower dose of flomax, then that may help with the post nasal drip.  Take care.  Glad to see you.

## 2016-02-25 DIAGNOSIS — N401 Enlarged prostate with lower urinary tract symptoms: Secondary | ICD-10-CM | POA: Diagnosis not present

## 2016-02-25 DIAGNOSIS — R350 Frequency of micturition: Secondary | ICD-10-CM | POA: Diagnosis not present

## 2016-04-09 DIAGNOSIS — N401 Enlarged prostate with lower urinary tract symptoms: Secondary | ICD-10-CM | POA: Diagnosis not present

## 2016-04-09 DIAGNOSIS — R350 Frequency of micturition: Secondary | ICD-10-CM | POA: Diagnosis not present

## 2016-04-15 ENCOUNTER — Other Ambulatory Visit: Payer: Self-pay | Admitting: Urology

## 2016-05-17 ENCOUNTER — Ambulatory Visit (INDEPENDENT_AMBULATORY_CARE_PROVIDER_SITE_OTHER): Payer: Medicare Other | Admitting: Family Medicine

## 2016-05-17 ENCOUNTER — Encounter: Payer: Self-pay | Admitting: Family Medicine

## 2016-05-17 VITALS — BP 100/70 | HR 74 | Temp 97.4°F | Ht 71.0 in | Wt 210.5 lb

## 2016-05-17 DIAGNOSIS — M722 Plantar fascial fibromatosis: Secondary | ICD-10-CM

## 2016-05-17 NOTE — Progress Notes (Signed)
Pre visit review using our clinic review tool, if applicable. No additional management support is needed unless otherwise documented below in the visit note. 

## 2016-05-17 NOTE — Progress Notes (Signed)
Dr. Frederico Hamman T. Chenoa Luddy, MD, [facility] [place], [address] Phone: [phone] Fax: [phone]  05/17/2016  Patient: Paul Fry, MRN: [id number], DOB: 04-Sep-1945, 71 y.o.  Primary Physician:  Elsie Stain, MD  Chief Complaint: Foot Orthotics  Subjective:   Paul Fry is a 71 y.o. very pleasant male patient who presents with the following:  Patient with intermittent PF with improvement with orthotics, who also has been treated by podiatry and had multiple kenalog injections and traditional HEP. Breakdown of his current orthotics.  11/13/2014 Last OV with Owens Loffler, MD  Consult plantar fascia and orthotics.   Very pleasant gentleman who has been struggling with plantar fasciitis for many months, doing routine regular stretching, he has bought multiple. As if she is not over-the-counter orthotics, and he additionally has had multiple rounds of Kenalog injections by podiatry. He still is fairly miserable due to his heel pain, and he is here to have any kind of advice or recommendations. My partner thought that he might be an excellent candidate for custom orthotics.  Past Medical History, Surgical History, Social History, Family History, Problem List, Medications, and Allergies have been reviewed and updated if relevant.  Patient Active Problem List   Diagnosis Date Noted  . Lower urinary tract symptoms (LUTS) 11/19/2014  . Cough 10/23/2014  . Advance care planning 07/16/2014  . Hyperglycemia 07/16/2014  . HTN (hypertension) 07/09/2013  . Paresthesia 12/25/2012  . Cardiomyopathy ([place]) 09/26/2012  . ED (erectile dysfunction) 05/26/2012  . DOE (dyspnea on exertion) 06/18/2011  . Plantar fascia syndrome 05/26/2011  . Medicare annual wellness visit, subsequent 05/26/2011  . Hypercholesteremia 03/03/2011  . CAD, UNSPECIFIED SITE 10/14/2008  . GERD 10/14/2008  . DIVERTICULOSIS OF COLON 04/03/2008    Past  Medical History:  Diagnosis Date  . Diverticulosis 7/23-28/10   Lower GI diverticular bleed felt due to divertic acute fe def anemia   . Ejection fraction < 50% 5/10   LHC: 30-40% mid RCA stenosis, no obstructive disease. EF 55%  . GERD (gastroesophageal reflux disease)   . Hiatal hernia   . Hyperlipidemia   . Plantar fascia syndrome     Past Surgical History:  Procedure Laterality Date  . APPENDECTOMY     high school  . CARDIAC CATHETERIZATION  5/3-4/10   HOPS CP cath (Dr. [name]). cath 20% stenosis prox LAD EF 55%-09/10/08  . COLONOSCOPY  10/23/07   divertics, [facility]), negative polyps (1st= ~04; 2nd =~14)  . HOSP [facility]  12/16-18/11   presumed lower GI bleed Divert     Social History   Social History  . Marital status: Married    Spouse name: N/A  . Number of children: 0  . Years of education: N/A   Occupational History  . truck driver- retired Retired   Social History Main Topics  . Smoking status: Never Smoker  . Smokeless tobacco: Never Used     Comment: tobacco use - no  . Alcohol use Yes     Comment: rarely  . Drug use: No  . Sexual activity: Not on file   Other Topics Concern  . Not on file   Social History Narrative   Married 2003, no children. Retired Administrator, gets regular exercise- walking.    Step daughter is MD in [place]    Family History  Problem Relation Age of Onset  . Diabetes Neg Hx     DM  . Depression Neg Hx   .  Stroke Neg Hx   . Alcohol abuse Neg Hx     also no drug abuse   . Prostate cancer Neg Hx   . Colon polyps Brother   . Colon cancer Mother 78  . Lung cancer Brother   . Heart disease Sister     MI, pace maker  . Kidney disease Sister     renal failure, nephrectomy- not cancer  . Hypertension Sister   . Hypertension Sister     No Known Allergies  Medication list reviewed and updated in full in [place].  GEN: No fevers, chills. Nontoxic. Primarily MSK c/o today. MSK: Detailed in the HPI GI: tolerating PO  intake without difficulty Neuro: No numbness, parasthesias, or tingling associated. Otherwise the pertinent positives of the ROS are noted above.   Objective:   BP 100/70   Pulse 74   Temp 97.4 F (36.3 C) (Oral)   Ht 5\' 11"  (1.803 m)   Wt 210 lb 8 oz (95.5 kg)   BMI 29.36 kg/m    GEN: WDWN, NAD, Non-toxic, Alert & Oriented x 3 HEENT: Atraumatic, Normocephalic.  Ears and Nose: No external deformity. EXTR: No clubbing/cyanosis/edema NEURO: Normal gait.  PSYCH: Normally interactive. Conversant. Not depressed or anxious appearing.  Calm demeanor.   FEET: B Echymosis: no Edema: no ROM: full LE B Gait: heel toe, non-antalgic MT pain: no Callus pattern: none Lateral Mall: NT Medial Mall: NT Talus: NT Navicular: NT Cuboid: NT Calcaneous: NT Metatarsals: NT 5th MT: NT Phalanges: NT Achilles: NT Plantar Fascia: TTP B Fat Pad: NT Peroneals: NT Post Tib: NT Great Toe: Nml motion Ant Drawer: neg ATFL: NT CFL: NT Deltoid: NT Other foot breakdown: none Long arch: moderate breakdown Transverse arch: mod breakdown with bunion / bunionette formation Hindfoot breakdown: none Sensation: intact   Radiology: No results found.  Assessment and Plan:   Plantar fascia syndrome  >40 minutes spent in face to face time with patient, >50% spent in counselling or coordination of care  Gait analysis, anatomy discussion, footwear discussion.  Patient was fitted for a standard, cushioned, semi-rigid orthotic.  The orthotic was heated and the patient stood on the orthotic blank positioned on the orthotic stand. The patient was positioned in subtalar neutral position and 10 degrees of ankle dorsiflexion in a weight bearing stance. After molding, a stable [employer] EVA base was applied to the orthotic blank.   The blank was ground to a stable position for weight bearing. Size: 13 base: Green F4 posting: none additional orthotic padding: none   Follow-up: prn  Signed,  Bruk [place]  T. Arlow Spiers, MD   Allergies as of 05/17/2016   No Known Allergies     Medication List       Accurate as of 05/17/16  2:53 PM. Always use your most recent med list.          ascorbic acid 1000 MG tablet Commonly known as:  VITAMIN C Take 1,000 mg by mouth daily.   atorvastatin 20 MG tablet Commonly known as:  LIPITOR Take 1 tablet (20 mg total) by mouth daily.   carvedilol 3.125 MG tablet Commonly known as:  COREG Take 1 tablet (3.125 mg total) by mouth 2 (two) times daily.   cetirizine 10 MG tablet Commonly known as:  ZYRTEC Take 10 mg by mouth daily.   fish oil-omega-3 fatty acids 1000 MG capsule Take 1 g by mouth daily.   fluticasone 50 MCG/ACT nasal spray Commonly known as:  FLONASE Place 1-2 sprays  into both nostrils daily.   losartan 25 MG tablet Commonly known as:  COZAAR Take 1 tablet (25 mg total) by mouth daily.   multivitamin tablet Take 1 tablet by mouth daily.   omeprazole 20 MG capsule Commonly known as:  PRILOSEC Take 1 capsule (20 mg total) by mouth 2 (two) times daily.   tamsulosin 0.4 MG Caps capsule Commonly known as:  FLOMAX Take 2 capsules (0.8 mg total) by mouth daily.   TUMS 500 MG chewable tablet Generic drug:  calcium carbonate Chew 1 tablet by mouth as needed. Reported on 07/17/2015

## 2016-05-24 ENCOUNTER — Encounter (HOSPITAL_BASED_OUTPATIENT_CLINIC_OR_DEPARTMENT_OTHER): Payer: Self-pay | Admitting: *Deleted

## 2016-05-25 ENCOUNTER — Encounter (HOSPITAL_BASED_OUTPATIENT_CLINIC_OR_DEPARTMENT_OTHER): Payer: Self-pay | Admitting: *Deleted

## 2016-05-25 NOTE — Progress Notes (Signed)
NPO AFTER MN.  ARRIVE AT 0600.  NEEDS ISTAT AND EKG.  WILL TAKE AM MEDS W/ SIPS OF WATER DOS.  

## 2016-05-26 NOTE — H&P (Signed)
Office Visit Report     04/09/2016   --------------------------------------------------------------------------------   Paul Fry  MRN: [id number]  PRIMARY CARE:    DOB: [birth year], 71 year old Male  REFERRING:  Paul Fry I. Paul Arabian, MD  SSN: [id number]  PROVIDER:  Carolan Fry, M.D.    LOCATION:  [facility] [phone]   --------------------------------------------------------------------------------   CC: BPH New Patient  HPI: Paul Fry is a 71 year-old male established patient who is here today as a new patient for evaluation of BPH and lower tract symptoms.  He is a patient of Dr. Damita Fry seen in office consultation today. The patient complains of lower urinary tract symptom(s) that include frequency, urgency, hesitancy, weak stream, intermittency, straining, nocturia, and sense of incomplete emptying. The patient states if he were to spend the rest of his life with his current urinary condition, he would be mixed. Patient is currently treated with Tamsulosin double dose. for his symptoms.   01/20/16 PSA - 1.22  2016 PSA - 1.01  2014 PSA - 0.85     AUA Symptom Score: More than 50% of the time he has the sensation of not emptying his bladder completely when finished urinating. More than 50% of the time he has to urinate again fewer than two hours after he has finished urinating. 50% of the time he has to start and stop again several times when he urinates. Less than 50% of the time he finds it difficult to postpone urination. 50% of the time he has a weak urinary stream. 50% of the time he has to push or strain to begin urination. He has to get up to urinate 2 times from the time he goes to bed until the time he gets up in the morning.   Calculated AUA Symptom Score: 21    ALLERGIES: None   MEDICATIONS: Omeprazole 20 mg capsule,delayed release  Tamsulosin Hcl 0.4 mg capsule, ext release 24 hr  Atorvastatin Calcium 20 mg tablet  Carvedilol 3.125 mg  tablet  Fish Oil Omega-3  Loratadine 10 mg tablet  Losartan Potassium 25 mg tablet  Multivitamin     GU PSH: None   NON-GU PSH: Appendectomy    GU PMH: BPH w/LUTS - 71/18/2017    NON-GU PMH: GERD Hypercholesterolemia Personal history of other diseases of the musculoskeletal system and connective tissue    FAMILY HISTORY: Cancer - Mother   SOCIAL HISTORY: Marital Status: Married Current Smoking Status: Patient has never smoked.  Does drink.  Patient's occupation is/was Retired.    REVIEW OF SYSTEMS:    GU Review Male:   weak flow. Patient reports hard to postpone urination, get up at night to urinate, have to strain to urinate , leakage of urine, and erection problems. Patient denies frequent urination, trouble starting your stream, penile pain, burning/ pain with urination, and stream starts and stops.  Gastrointestinal (Upper):   Patient reports indigestion/ heartburn. Patient denies nausea and vomiting.  Gastrointestinal (Lower):   Patient denies diarrhea and constipation.  Constitutional:   Patient denies fever, night sweats, weight loss, and fatigue.  Skin:   Patient denies skin rash/ lesion and itching.  Eyes:   Patient denies blurred vision and double vision.  Ears/ Nose/ Throat:   Patient reports sinus problems. Patient denies sore throat.  Hematologic/Lymphatic:   Patient denies swollen glands and easy bruising.  Cardiovascular:   Patient denies leg swelling and chest pains.  Respiratory:   Patient denies cough and shortness of breath.  Endocrine:  Patient denies excessive thirst.  Musculoskeletal:   Patient denies back pain and joint pain.  Neurological:   Patient denies headaches and dizziness.  Psychologic:   Patient denies depression and anxiety.   Notes: Reviewed previous review of systems 71/18/2017. No changes.    VITAL SIGNS:      04/09/2016 03:26 PM  BP 132/73 mmHg  Pulse 68 /min  Temperature 97.7 F / 36 C   GU PHYSICAL EXAMINATION:    Anus and  Perineum: No hemorrhoids. No anal stenosis. No rectal fissure, no anal fissure. No edema, no dimple, no perineal tenderness, no anal tenderness.  Scrotum: No lesions. No edema. No cysts. No warts.  Epididymides: Right: no spermatocele, no masses, no cysts, no tenderness, no induration, no enlargement. Left: no spermatocele, no masses, no cysts, no tenderness, no induration, no enlargement.  Testes: No tenderness, no swelling, no enlargement left testes. No tenderness, no swelling, no enlargement right testes. Normal location left testes. Normal location right testes. No mass, no cyst, no varicocele, no hydrocele left testes. No mass, no cyst, no varicocele, no hydrocele right testes.  Urethral Meatus: Normal size. No lesion, no wart, no discharge, no polyp. Normal location.  Penis: Circumcised, no warts, no cracks. No dorsal Peyronie's plaques, no left corporal Peyronie's plaques, no right corporal Peyronie's plaques, no scarring, no warts. No balanitis, no meatal stenosis.  Prostate: Prostate 3 + size. Left lobe normal consistency, right lobe normal consistency. Symmetrical lobes. No prostate nodule. Left lobe no tenderness, right lobe no tenderness.   Seminal Vesicles: Nonpalpable.  Sphincter Tone: Normal sphincter. No rectal tenderness. No rectal mass.    MULTI-SYSTEM PHYSICAL EXAMINATION:    Constitutional: Thin. No physical deformities. Normally developed. Good grooming.   Neck: Neck symmetrical, not swollen. Normal tracheal position.  Respiratory: No labored breathing, no use of accessory muscles.   Cardiovascular: Normal temperature, normal extremity pulses, no swelling, no varicosities.  Lymphatic: No enlargement of neck, axillae, groin.  Skin: No paleness, no jaundice, no cyanosis. No lesion, no ulcer, no rash.  Neurologic / Psychiatric: Oriented to time, oriented to place, oriented to person. No depression, no anxiety, no agitation.  Gastrointestinal: No mass, no tenderness, no rigidity,  non obese abdomen.  Eyes: Normal conjunctivae. Normal eyelids.  Ears, Nose, Mouth, and Throat: Left ear no scars, no lesions, no masses. Right ear no scars, no lesions, no masses. Nose no scars, no lesions, no masses. Normal hearing. Normal lips.  Musculoskeletal: Normal gait and station of head and neck.     PAST DATA REVIEWED:  Source Of History:  Patient  Records Review:   Previous Patient Records  Urodynamics Review:   Review Flow Rate  X-Ray Review: Prostate Ultrasound: Reviewed Films. Reviewed Report. Discussed With Patient.  PVR: Reviewed Films. Reviewed Report. Discussed With Patient.     04/09/16  Urinalysis  Urine Appearance Clear   Urine Color Amber   Urine Glucose Neg   Urine Bilirubin Neg   Urine Ketones Neg   Urine Specific Gravity 1.025   Urine Blood Neg   Urine pH 6.0   Urine Protein Neg   Urine Urobilinogen 1.0   Urine Nitrites Neg   Urine Leukocyte Esterase Neg    PROCEDURES:         Flexible Cystoscopy - 52000  Risks, benefits, and some of the potential complications of the procedure were discussed at length with the patient including infection, bleeding, voiding discomfort, urinary retention, fever, chills, sepsis, and others. All questions were answered. Informed consent  was obtained. Antibiotic prophylaxis was given. Sterile technique and intraurethral analgesia were used.  Given Cipro prophylactic.    Meatus:  Normal size. Normal location. Normal condition.  Urethra:  No strictures.  External Sphincter:  Normal.  Verumontanum:  Normal.  Prostate:  Non-obstructing. No hyperplasia.  Bladder Neck:  Non-obstructing.  Ureteral Orifices:  Normal location. Normal size. Normal shape. Effluxed clear urine.  Bladder:  No trabeculation. No tumors. Normal mucosa. No stones.      The lower urinary tract was carefully examined. The procedure was well-tolerated and without complications. Antibiotic instructions were given. Instructions were given to call the  office immediately for bloody urine, difficulty urinating, urinary retention, painful or frequent urination, fever, chills, nausea, vomiting or other illness. The patient stated that he understood these instructions and would comply with them.          Flow Rate - 51741  Flow Time: 0:44 min:sec  Voided Volume: 373 cc  Peak Flow Rate: 13 cc/sec  Time of Peak Flow: 0:11 min:sec  Average Flow Rate: 8 cc/sec  Total Void Time: 0:45 min:sec           PVR Ultrasound - [id number]  Scanned Volume: 99.16 cc         Urinalysis Dipstick Dipstick Cont'd  Color: Amber Bilirubin: Neg  Appearance: Clear Ketones: Neg  Specific Gravity: 1.025 Blood: Neg  pH: 6.0 Protein: Neg  Glucose: Neg Urobilinogen: 1.0    Nitrites: Neg    Leukocyte Esterase: Neg    ASSESSMENT:      ICD-10 Details  1 GU:   BPH w/LUTS - N40.1           Notes:   Paul Fry is a 34 male, referred by Dr. Ezequiel Essex, for BPH, and continued lower tract symptoms, despite double dose Tamsulosin. PSA is 1.22, with calculated AUA symptom score sheet is 21/7; with symptoms of incomplete emptying, frequency ,urgency, intermittency, weak stream, and nocturia 2.  He returns today for cystoscopy, flow rate postvoid residual. Cystoscopy shows mild trabeculation, with some indentation of the lateral neck from the prostate. The prostate itself shows moderate bilobar BPH. There is no evidence of bladder stone, tumor, or diverticular formation. His prostate volume is small at 21.7 cc volume, and flow rate shows a peak of 13 cc/s, with a mean flow of 9 cc/s (normal. 25 cc).. His postvoid residual is 99.16 cc ( N < 50cc).  We discussed options for his therapy. Because he is already taking double dose tamsulosin, ( max medical therapy) I think he is going to need operative intervention. Minimally invasive treatment for him will be Urolift procedure, which would give him 5 years of voiding improvement. He could expect 50% decrease in his IPSS score, down  to approximately 12/7. He will need to have catheter for approximately 2 days. Should he fail this therapy, he could have any other therapy, such as laser treatment, or TURP. Should he develop prostate cancer in the future, he could have any therapy necessary. Also, this would offer him the least aggressive anesthetic, and easiest to recover from.  Other options would be laser prostatectomy, or standard TURP. The standard TURP is the the "gold" standard, and could be expected to last him for 15 years. The laser surgery would probably last 10-12 years, but both require more anesthesia. Standard TURP would probably require overnight hospitalization, but both the Urolift and the laser surgery could be done as outpatient, as long as he did well under anesthesia.(  patient lives in [place] ,[place]).  Paul Fry desires to have case posted as Urolift for early January, as outpatient. OR sheet is completed. He has asked for a copy of note today to help explain his BPH problem. I will also send a copy to his physician.    PLAN:           Schedule Return Visit: 05/10/2016 - Schedule Surgery          Document Letter(s):  Created for Patient: Clinical Summary         The information contained in this medical record document is considered private and confidential patient information. This information can only be used for the medical diagnosis and/or medical services that are being provided by the patient's selected caregivers. This information can only be distributed outside of the patient's care if the patient agrees and signs waivers of authorization for this information to be sent to an outside source or route.

## 2016-05-27 ENCOUNTER — Ambulatory Visit (HOSPITAL_BASED_OUTPATIENT_CLINIC_OR_DEPARTMENT_OTHER): Admission: RE | Admit: 2016-05-27 | Payer: Medicare Other | Source: Ambulatory Visit | Admitting: Urology

## 2016-05-27 HISTORY — DX: Type 2 diabetes mellitus without complications: E11.9

## 2016-05-27 HISTORY — DX: Other cardiomyopathies: I42.8

## 2016-05-27 HISTORY — DX: Personal history of other diseases of the digestive system: Z87.19

## 2016-05-27 HISTORY — DX: Diverticulosis of large intestine without perforation or abscess without bleeding: K57.30

## 2016-05-27 HISTORY — DX: Presence of spectacles and contact lenses: Z97.3

## 2016-05-27 HISTORY — DX: Unspecified symptoms and signs involving the genitourinary system: R39.9

## 2016-05-27 HISTORY — DX: Elevated prostate specific antigen (PSA): R97.20

## 2016-05-27 HISTORY — DX: Straining to void: R39.16

## 2016-05-27 HISTORY — DX: Personal history of other infectious and parasitic diseases: Z86.19

## 2016-05-27 HISTORY — DX: Benign prostatic hyperplasia without lower urinary tract symptoms: N40.0

## 2016-05-27 HISTORY — DX: Feeling of incomplete bladder emptying: R39.14

## 2016-05-27 HISTORY — DX: Personal history of colonic polyps: Z86.010

## 2016-05-27 HISTORY — DX: Essential (primary) hypertension: I10

## 2016-05-27 HISTORY — DX: Personal history of adenomatous and serrated colon polyps: Z86.0101

## 2016-05-27 SURGERY — CYSTOSCOPY WITH INSERTION OF UROLIFT
Anesthesia: General

## 2016-05-31 ENCOUNTER — Other Ambulatory Visit: Payer: Self-pay | Admitting: Urology

## 2016-06-14 ENCOUNTER — Encounter (HOSPITAL_BASED_OUTPATIENT_CLINIC_OR_DEPARTMENT_OTHER): Payer: Self-pay | Admitting: *Deleted

## 2016-06-14 NOTE — Progress Notes (Signed)
NPO AFTER MN. ARRIVE AT 0600. NEEDS ISTAT AND EKG. WILL TAKE AM MEDS W/ SIPS OF WATER.  

## 2016-06-17 ENCOUNTER — Encounter (HOSPITAL_BASED_OUTPATIENT_CLINIC_OR_DEPARTMENT_OTHER): Payer: Self-pay | Admitting: *Deleted

## 2016-06-17 ENCOUNTER — Ambulatory Visit (HOSPITAL_BASED_OUTPATIENT_CLINIC_OR_DEPARTMENT_OTHER)
Admission: RE | Admit: 2016-06-17 | Discharge: 2016-06-17 | Disposition: A | Discharge status: Home / Self Care | Payer: Medicare Other | Source: Ambulatory Visit | Attending: Urology | Admitting: Urology

## 2016-06-17 ENCOUNTER — Ambulatory Visit (HOSPITAL_BASED_OUTPATIENT_CLINIC_OR_DEPARTMENT_OTHER): Payer: Medicare Other | Admitting: Anesthesiology

## 2016-06-17 ENCOUNTER — Encounter (HOSPITAL_BASED_OUTPATIENT_CLINIC_OR_DEPARTMENT_OTHER)
Admission: RE | Disposition: A | Discharge status: Home / Self Care | Payer: Self-pay | Source: Ambulatory Visit | Attending: Urology

## 2016-06-17 DIAGNOSIS — R3911 Hesitancy of micturition: Secondary | ICD-10-CM | POA: Insufficient documentation

## 2016-06-17 DIAGNOSIS — R399 Unspecified symptoms and signs involving the genitourinary system: Secondary | ICD-10-CM

## 2016-06-17 DIAGNOSIS — R3914 Feeling of incomplete bladder emptying: Secondary | ICD-10-CM | POA: Diagnosis not present

## 2016-06-17 DIAGNOSIS — R3915 Urgency of urination: Secondary | ICD-10-CM | POA: Diagnosis not present

## 2016-06-17 DIAGNOSIS — I10 Essential (primary) hypertension: Secondary | ICD-10-CM | POA: Insufficient documentation

## 2016-06-17 DIAGNOSIS — R3912 Poor urinary stream: Secondary | ICD-10-CM | POA: Insufficient documentation

## 2016-06-17 DIAGNOSIS — E7800 Pure hypercholesterolemia, unspecified: Secondary | ICD-10-CM | POA: Diagnosis not present

## 2016-06-17 DIAGNOSIS — K219 Gastro-esophageal reflux disease without esophagitis: Secondary | ICD-10-CM | POA: Diagnosis not present

## 2016-06-17 DIAGNOSIS — N400 Enlarged prostate without lower urinary tract symptoms: Secondary | ICD-10-CM | POA: Diagnosis not present

## 2016-06-17 DIAGNOSIS — R351 Nocturia: Secondary | ICD-10-CM | POA: Insufficient documentation

## 2016-06-17 DIAGNOSIS — Z79899 Other long term (current) drug therapy: Secondary | ICD-10-CM | POA: Insufficient documentation

## 2016-06-17 DIAGNOSIS — R3916 Straining to void: Secondary | ICD-10-CM | POA: Insufficient documentation

## 2016-06-17 DIAGNOSIS — N401 Enlarged prostate with lower urinary tract symptoms: Secondary | ICD-10-CM | POA: Diagnosis not present

## 2016-06-17 DIAGNOSIS — E119 Type 2 diabetes mellitus without complications: Secondary | ICD-10-CM | POA: Insufficient documentation

## 2016-06-17 DIAGNOSIS — I429 Cardiomyopathy, unspecified: Secondary | ICD-10-CM | POA: Insufficient documentation

## 2016-06-17 DIAGNOSIS — I2510 Atherosclerotic heart disease of native coronary artery without angina pectoris: Secondary | ICD-10-CM | POA: Diagnosis not present

## 2016-06-17 HISTORY — PX: CYSTOSCOPY WITH INSERTION OF UROLIFT: SHX6678

## 2016-06-17 LAB — POCT I-STAT, CHEM 8
BUN: 10 mg/dL (ref 6–20)
CREATININE: 0.8 mg/dL (ref 0.61–1.24)
Calcium, Ion: 1.18 mmol/L (ref 1.15–1.40)
Chloride: 104 mmol/L (ref 101–111)
Glucose, Bld: 115 mg/dL — ABNORMAL HIGH (ref 65–99)
HEMATOCRIT: 40 % (ref 39.0–52.0)
Hemoglobin: 13.6 g/dL (ref 13.0–17.0)
Potassium: 4 mmol/L (ref 3.5–5.1)
SODIUM: 141 mmol/L (ref 135–145)
TCO2: 24 mmol/L (ref 0–100)

## 2016-06-17 LAB — GLUCOSE, CAPILLARY: GLUCOSE-CAPILLARY: 123 mg/dL — AB (ref 65–99)

## 2016-06-17 SURGERY — CYSTOSCOPY WITH INSERTION OF UROLIFT
Anesthesia: General | Site: Prostate

## 2016-06-17 MED ORDER — SODIUM CHLORIDE 0.9% FLUSH
3.0000 mL | INTRAVENOUS | Status: DC | PRN
  Filled 2016-06-17: qty 3

## 2016-06-17 MED ORDER — TRIMETHOPRIM 100 MG PO TABS: 100.0000 mg | ORAL_TABLET | Freq: Every day | ORAL | 0 refills | Status: DC

## 2016-06-17 MED ORDER — FENTANYL CITRATE (PF) 100 MCG/2ML IJ SOLN
25.0000 ug | INTRAMUSCULAR | Status: DC | PRN
  Filled 2016-06-17: qty 1

## 2016-06-17 MED ORDER — BELLADONNA ALKALOIDS-OPIUM 16.2-60 MG RE SUPP
RECTAL | Status: AC
  Filled 2016-06-17: qty 1

## 2016-06-17 MED ORDER — EPHEDRINE SULFATE 50 MG/ML IJ SOLN
INTRAMUSCULAR | Status: DC | PRN
  Administered 2016-06-17: 15 mg via INTRAVENOUS

## 2016-06-17 MED ORDER — LIDOCAINE 2% (20 MG/ML) 5 ML SYRINGE
INTRAMUSCULAR | Status: DC | PRN
  Administered 2016-06-17: 60 mg via INTRAVENOUS

## 2016-06-17 MED ORDER — ACETAMINOPHEN 500 MG PO TABS
ORAL_TABLET | ORAL | Status: AC
  Filled 2016-06-17: qty 2

## 2016-06-17 MED ORDER — ACETAMINOPHEN 500 MG PO TABS
1000.0000 mg | ORAL_TABLET | ORAL | Status: AC
  Administered 2016-06-17: 1000 mg via ORAL
  Filled 2016-06-17: qty 2

## 2016-06-17 MED ORDER — CEFAZOLIN SODIUM-DEXTROSE 2-4 GM/100ML-% IV SOLN
INTRAVENOUS | Status: AC
  Filled 2016-06-17: qty 100

## 2016-06-17 MED ORDER — LIDOCAINE 2% (20 MG/ML) 5 ML SYRINGE
INTRAMUSCULAR | Status: AC
  Filled 2016-06-17: qty 5

## 2016-06-17 MED ORDER — PHENAZOPYRIDINE HCL 200 MG PO TABS: 200.0000 mg | ORAL_TABLET | Freq: Three times a day (TID) | ORAL | 0 refills | Status: DC | PRN

## 2016-06-17 MED ORDER — FENTANYL CITRATE (PF) 100 MCG/2ML IJ SOLN
INTRAMUSCULAR | Status: DC | PRN
  Administered 2016-06-17: 50 ug via INTRAVENOUS

## 2016-06-17 MED ORDER — KETOROLAC TROMETHAMINE 30 MG/ML IJ SOLN
INTRAMUSCULAR | Status: AC
  Filled 2016-06-17: qty 1

## 2016-06-17 MED ORDER — LACTATED RINGERS IV SOLN
INTRAVENOUS | Status: DC
  Administered 2016-06-17: 07:00:00 via INTRAVENOUS
  Filled 2016-06-17: qty 1000

## 2016-06-17 MED ORDER — MIDAZOLAM HCL 5 MG/5ML IJ SOLN
INTRAMUSCULAR | Status: DC | PRN
  Administered 2016-06-17: 2 mg via INTRAVENOUS

## 2016-06-17 MED ORDER — OXYCODONE HCL 5 MG PO TABS
5.0000 mg | ORAL_TABLET | ORAL | Status: DC | PRN
  Filled 2016-06-17: qty 2

## 2016-06-17 MED ORDER — OXYCODONE HCL 5 MG PO TABS
5.0000 mg | ORAL_TABLET | Freq: Once | ORAL | Status: AC | PRN
  Administered 2016-06-17: 5 mg via ORAL
  Filled 2016-06-17: qty 1

## 2016-06-17 MED ORDER — CEFAZOLIN SODIUM-DEXTROSE 2-4 GM/100ML-% IV SOLN
2.0000 g | INTRAVENOUS | Status: AC
  Administered 2016-06-17: 2 g via INTRAVENOUS
  Filled 2016-06-17: qty 100

## 2016-06-17 MED ORDER — ONDANSETRON HCL 4 MG/2ML IJ SOLN
INTRAMUSCULAR | Status: AC
  Filled 2016-06-17: qty 4

## 2016-06-17 MED ORDER — PHENAZOPYRIDINE HCL 100 MG PO TABS
ORAL_TABLET | ORAL | Status: AC
  Filled 2016-06-17: qty 2

## 2016-06-17 MED ORDER — PHENAZOPYRIDINE HCL 200 MG PO TABS
200.0000 mg | ORAL_TABLET | Freq: Three times a day (TID) | ORAL | Status: DC
  Administered 2016-06-17: 200 mg via ORAL
  Filled 2016-06-17: qty 1

## 2016-06-17 MED ORDER — DEXAMETHASONE SODIUM PHOSPHATE 10 MG/ML IJ SOLN
INTRAMUSCULAR | Status: AC
  Filled 2016-06-17: qty 1

## 2016-06-17 MED ORDER — OXYCODONE HCL 5 MG/5ML PO SOLN
5.0000 mg | Freq: Once | ORAL | Status: AC | PRN
  Filled 2016-06-17: qty 5

## 2016-06-17 MED ORDER — ONDANSETRON HCL 4 MG/2ML IJ SOLN
4.0000 mg | Freq: Once | INTRAMUSCULAR | Status: DC | PRN
  Filled 2016-06-17: qty 2

## 2016-06-17 MED ORDER — PROPOFOL 10 MG/ML IV BOLUS
INTRAVENOUS | Status: DC | PRN
  Administered 2016-06-17: 200 mg via INTRAVENOUS

## 2016-06-17 MED ORDER — OXYCODONE HCL 5 MG PO TABS
ORAL_TABLET | ORAL | Status: AC
  Filled 2016-06-17: qty 1

## 2016-06-17 MED ORDER — ONDANSETRON HCL 4 MG/2ML IJ SOLN
INTRAMUSCULAR | Status: DC | PRN
  Administered 2016-06-17: 4 mg via INTRAVENOUS

## 2016-06-17 MED ORDER — KETOROLAC TROMETHAMINE 30 MG/ML IJ SOLN
INTRAMUSCULAR | Status: DC | PRN
  Administered 2016-06-17: 30 mg via INTRAVENOUS

## 2016-06-17 MED ORDER — TRAMADOL-ACETAMINOPHEN 37.5-325 MG PO TABS: 1.0000 | ORAL_TABLET | Freq: Four times a day (QID) | ORAL | 0 refills | Status: DC | PRN

## 2016-06-17 MED ORDER — SODIUM CHLORIDE 0.9 % IV SOLN
250.0000 mL | INTRAVENOUS | Status: DC | PRN
  Filled 2016-06-17: qty 250

## 2016-06-17 MED ORDER — STERILE WATER FOR IRRIGATION IR SOLN
Status: DC | PRN
  Administered 2016-06-17: 6000 mL

## 2016-06-17 MED ORDER — ACETAMINOPHEN 650 MG RE SUPP
650.0000 mg | RECTAL | Status: DC | PRN
  Filled 2016-06-17: qty 1

## 2016-06-17 MED ORDER — ACETAMINOPHEN 325 MG PO TABS
650.0000 mg | ORAL_TABLET | ORAL | Status: DC | PRN
  Filled 2016-06-17: qty 2

## 2016-06-17 MED ORDER — ONDANSETRON HCL 4 MG/2ML IJ SOLN
INTRAMUSCULAR | Status: AC
  Filled 2016-06-17: qty 2

## 2016-06-17 MED ORDER — FENTANYL CITRATE (PF) 100 MCG/2ML IJ SOLN
INTRAMUSCULAR | Status: AC
  Filled 2016-06-17: qty 2

## 2016-06-17 MED ORDER — PROPOFOL 10 MG/ML IV BOLUS
INTRAVENOUS | Status: AC
Start: 2016-06-17 — End: 2016-06-17
  Filled 2016-06-17: qty 40

## 2016-06-17 MED ORDER — MIDAZOLAM HCL 2 MG/2ML IJ SOLN
INTRAMUSCULAR | Status: AC
  Filled 2016-06-17: qty 2

## 2016-06-17 MED ORDER — DEXAMETHASONE SODIUM PHOSPHATE 4 MG/ML IJ SOLN
INTRAMUSCULAR | Status: DC | PRN
  Administered 2016-06-17: 10 mg via INTRAVENOUS

## 2016-06-17 MED ORDER — SODIUM CHLORIDE 0.9% FLUSH
3.0000 mL | Freq: Two times a day (BID) | INTRAVENOUS | Status: DC
  Filled 2016-06-17: qty 3

## 2016-06-17 SURGICAL SUPPLY — 21 items
BAG URINE DRAINAGE (UROLOGICAL SUPPLIES) ×3 IMPLANT
BAG URO CATCHER STRL LF (MISCELLANEOUS) ×3 IMPLANT
CATH COUDE FOLEY 2W 5CC 16FR (CATHETERS) ×3 IMPLANT
CATH FOLEY 2WAY SLVR  5CC 16FR (CATHETERS) ×2
CATH FOLEY 2WAY SLVR 5CC 16FR (CATHETERS) ×1 IMPLANT
CLOTH BEACON ORANGE TIMEOUT ST (SAFETY) ×3 IMPLANT
GLOVE BIOGEL M STRL SZ7.5 (GLOVE) ×3 IMPLANT
GLOVE BIOGEL PI IND STRL 7.0 (GLOVE) ×1 IMPLANT
GLOVE BIOGEL PI INDICATOR 7.0 (GLOVE) ×2
GLOVE ECLIPSE 6.5 STRL STRAW (GLOVE) ×3 IMPLANT
GOWN STRL REUS W/TWL XL LVL3 (GOWN DISPOSABLE) ×6 IMPLANT
HOLDER FOLEY CATH W/STRAP (MISCELLANEOUS) ×3 IMPLANT
IV NS IRRIG 3000ML ARTHROMATIC (IV SOLUTION) IMPLANT
MANIFOLD NEPTUNE II (INSTRUMENTS) ×3 IMPLANT
PACK CYSTO (CUSTOM PROCEDURE TRAY) ×3 IMPLANT
SYSTEM UROLIFT (Male Continence) ×12 IMPLANT
TUBE CONNECTING 12'X1/4 (SUCTIONS) ×1
TUBE CONNECTING 12X1/4 (SUCTIONS) ×2 IMPLANT
TUBING CONNECTING 10 (TUBING) ×2 IMPLANT
TUBING CONNECTING 10' (TUBING) ×1
WATER STERILE IRR 3000ML UROMA (IV SOLUTION) ×6 IMPLANT

## 2016-06-17 NOTE — Anesthesia Postprocedure Evaluation (Signed)
Anesthesia Post Note  Patient: Paul Fry  Procedure(s) Performed: Procedure(s) (LRB): CYSTOSCOPY WITH INSERTION OF UROLIFT (N/A)  Patient location during evaluation: PACU Anesthesia Type: General Level of consciousness: awake, awake and alert and oriented Pain management: pain level controlled Vital Signs Assessment: post-procedure vital signs reviewed and stable Respiratory status: spontaneous breathing, nonlabored ventilation and respiratory function stable Cardiovascular status: blood pressure returned to baseline Anesthetic complications: no       Last Vitals:  Vitals:   06/17/16 0845 06/17/16 0945  BP: (!) 154/78 (!) 147/66  Pulse: 61 60  Resp: 12 12  Temp:  36.4 C    Last Pain:  Vitals:   06/17/16 0945  TempSrc:   PainSc: 3                  Binyomin Brann COKER

## 2016-06-17 NOTE — Anesthesia Preprocedure Evaluation (Addendum)
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%   Neuro/Psych    GI/Hepatic   Endo/Other  diabetes  Renal/GU      Musculoskeletal   Abdominal   Peds  Hematology   Anesthesia Other Findings   Reproductive/Obstetrics                            Anesthesia Physical Anesthesia Plan  ASA: II  Anesthesia Plan: General   Post-op Pain Management:    Induction: Intravenous  Airway Management Planned: LMA  Additional Equipment:   Intra-op Plan:   Post-operative Plan:   Informed Consent: I have reviewed the patients History and Physical, chart, labs and discussed the procedure including the risks, benefits and alternatives for the proposed anesthesia with the patient or authorized representative who has indicated his/her understanding and acceptance.   Dental advisory given  Plan Discussed with: CRNA and Anesthesiologist  Anesthesia Plan Comments:         Anesthesia Quick Evaluation

## 2016-06-17 NOTE — Interval H&P Note (Signed)
History and Physical Interval Note:  06/17/2016 7:19 AM  Paul Fry  has presented today for surgery, with the diagnosis of BENIGN PROSTATE HYPREPLASIA  The various methods of treatment have been discussed with the patient and family. After consideration of risks, benefits and other options for treatment, the patient has consented to  Procedure(s): CYSTOSCOPY WITH INSERTION OF UROLIFT (N/A) as a surgical intervention .  The patient's history has been reviewed, patient examined, no change in status, stable for surgery.  I have reviewed the patient's chart and labs.  Questions were answered to the patient's satisfaction.     Paul Fry  Pt seen and examined, and procedure discussed again. He will have foley catheter placed for weekend as precaution, to allow edema to resolve,and to avoid possible return to ER. He will RTC Monday AM for voiding trial.

## 2016-06-17 NOTE — Anesthesia Procedure Notes (Signed)
Procedure Name: LMA Insertion Date/Time: 06/17/2016 7:28 AM Performed by: Wanita Chamberlain Pre-anesthesia Checklist: Patient identified, Timeout performed, Emergency Drugs available, Suction available and Patient being monitored Patient Re-evaluated:Patient Re-evaluated prior to inductionOxygen Delivery Method: Circle system utilized Preoxygenation: Pre-oxygenation with 100% oxygen Intubation Type: IV induction Ventilation: Mask ventilation without difficulty LMA: LMA inserted LMA Size: 4.0 Number of attempts: 1 Airway Equipment and Method: Bite block Placement Confirmation: positive ETCO2 and breath sounds checked- equal and bilateral Tube secured with: Tape Dental Injury: Teeth and Oropharynx as per pre-operative assessment

## 2016-06-17 NOTE — Anesthesia Preprocedure Evaluation (Addendum)
Anesthesia Evaluation  Patient identified by MRN, date of birth, ID band Patient awake    Reviewed: Allergy & Precautions, NPO status , Patient's Chart, lab work & pertinent test results  Airway Mallampati: II  TM Distance: >3 FB Neck ROM: Full    Dental  (+) Teeth Intact, Dental Advisory Given, Caps,    Pulmonary           Cardiovascular hypertension, Pt. on medications      Neuro/Psych    GI/Hepatic Neg liver ROS, hiatal hernia, GERD  ,  Endo/Other  diabetes, Well Controlled, Type 2  Renal/GU negative Renal ROS     Musculoskeletal   Abdominal   Peds  Hematology   Anesthesia Other Findings   Reproductive/Obstetrics                           Anesthesia Physical Anesthesia Plan  ASA: II  Anesthesia Plan: General   Post-op Pain Management:    Induction: Intravenous  Airway Management Planned: LMA  Additional Equipment:   Intra-op Plan:   Post-operative Plan:   Informed Consent: I have reviewed the patients History and Physical, chart, labs and discussed the procedure including the risks, benefits and alternatives for the proposed anesthesia with the patient or authorized representative who has indicated his/her understanding and acceptance.   Dental advisory given  Plan Discussed with: CRNA and Anesthesiologist  Anesthesia Plan Comments:         Anesthesia Quick Evaluation

## 2016-06-17 NOTE — Transfer of Care (Signed)
Immediate Anesthesia Transfer of Care Note  Patient: Paul Fry  Procedure(s) Performed: Procedure(s): CYSTOSCOPY WITH INSERTION OF UROLIFT (N/A)  Patient Location: PACU  Anesthesia Type:General  Level of Consciousness: awake, alert , oriented and patient cooperative  Airway & Oxygen Therapy: Patient Spontanous Breathing and Patient connected to nasal cannula oxygen  Post-op Assessment: Report given to RN and Post -op Vital signs reviewed and stable  Post vital signs: Reviewed and stable  Last Vitals:  Vitals:   06/17/16 0558  BP: 140/62  Pulse: (!) 57  Resp: 16  Temp: 36.4 C    Last Pain:  Vitals:   06/17/16 0558  TempSrc: Oral      Patients Stated Pain Goal: 5 ([id number])  Complications: No apparent anesthesia complications

## 2016-06-17 NOTE — H&P (Signed)
Office Visit Report     04/09/2016   --------------------------------------------------------------------------------   Paul Fry  MRN: [id number]  PRIMARY CARE:    DOB: 1946-01-17, 71 year old Male  REFERRING:  Margret Moat I. Gaynelle Arabian, MD  SSN PROVIDER:  [name], M.D.    LOCATION:  [facility] [phone]   --------------------------------------------------------------------------------   CC: BPH New Patient  HPI: Paul Fry is a 71 year-old male established patient who is here today as a new patient for evaluation of BPH and lower tract symptoms.  He is a patient of Dr. Damita Dunnings seen in office consultation today. The patient complains of lower urinary tract symptom(s) that include frequency, urgency, hesitancy, weak stream, intermittency, straining, nocturia, and sense of incomplete emptying. The patient states if he were to spend the rest of his life with his current urinary condition, he would be mixed. Patient is currently treated with Tamsulosin double dose. for his symptoms.   01/20/16 PSA - 1.22  2016 PSA - 1.01  2014 PSA - 0.85     AUA Symptom Score: More than 50% of the time he has the sensation of not emptying his bladder completely when finished urinating. More than 50% of the time he has to urinate again fewer than two hours after he has finished urinating. 50% of the time he has to start and stop again several times when he urinates. Less than 50% of the time he finds it difficult to postpone urination. 50% of the time he has a weak urinary stream. 50% of the time he has to push or strain to begin urination. He has to get up to urinate 2 times from the time he goes to bed until the time he gets up in the morning.   Calculated AUA Symptom Score: 21    ALLERGIES: None   MEDICATIONS: Omeprazole 20 mg capsule,delayed release  Tamsulosin Hcl 0.4 mg capsule, ext release 24 hr  Atorvastatin Calcium 20 mg tablet  Carvedilol 3.125 mg tablet   Fish Oil Omega-3  Loratadine 10 mg tablet  Losartan Potassium 25 mg tablet  Multivitamin     GU PSH: None   NON-GU PSH: Appendectomy    GU PMH: BPH w/LUTS - 02/25/2016    NON-GU PMH: GERD Hypercholesterolemia Personal history of other diseases of the musculoskeletal system and connective tissue    FAMILY HISTORY: Cancer - Mother   SOCIAL HISTORY: Marital Status: Married Current Smoking Status: Patient has never smoked.  Does drink.  Patient's occupation is/was Retired.    REVIEW OF SYSTEMS:    GU Review Male:   weak flow. Patient reports hard to postpone urination, get up at night to urinate, have to strain to urinate , leakage of urine, and erection problems. Patient denies frequent urination, trouble starting your stream, penile pain, burning/ pain with urination, and stream starts and stops.  Gastrointestinal (Upper):   Patient reports indigestion/ heartburn. Patient denies nausea and vomiting.  Gastrointestinal (Lower):   Patient denies diarrhea and constipation.  Constitutional:   Patient denies fever, night sweats, weight loss, and fatigue.  Skin:   Patient denies skin rash/ lesion and itching.  Eyes:   Patient denies blurred vision and double vision.  Ears/ Nose/ Throat:   Patient reports sinus problems. Patient denies sore throat.  Hematologic/Lymphatic:   Patient denies swollen glands and easy bruising.  Cardiovascular:   Patient denies leg swelling and chest pains.  Respiratory:   Patient denies cough and shortness of breath.  Endocrine:   Patient  denies excessive thirst.  Musculoskeletal:   Patient denies back pain and joint pain.  Neurological:   Patient denies headaches and dizziness.  Psychologic:   Patient denies depression and anxiety.   Notes: Reviewed previous review of systems 02/25/2016. No changes.    VITAL SIGNS:      04/09/2016 03:26 PM  BP 132/73 mmHg  Pulse 68 /min  Temperature 97.7 F / 36 C   GU PHYSICAL EXAMINATION:    Anus and  Perineum: No hemorrhoids. No anal stenosis. No rectal fissure, no anal fissure. No edema, no dimple, no perineal tenderness, no anal tenderness.  Scrotum: No lesions. No edema. No cysts. No warts.  Epididymides: Right: no spermatocele, no masses, no cysts, no tenderness, no induration, no enlargement. Left: no spermatocele, no masses, no cysts, no tenderness, no induration, no enlargement.  Testes: No tenderness, no swelling, no enlargement left testes. No tenderness, no swelling, no enlargement right testes. Normal location left testes. Normal location right testes. No mass, no cyst, no varicocele, no hydrocele left testes. No mass, no cyst, no varicocele, no hydrocele right testes.  Urethral Meatus: Normal size. No lesion, no wart, no discharge, no polyp. Normal location.  Penis: Circumcised, no warts, no cracks. No dorsal Peyronie's plaques, no left corporal Peyronie's plaques, no right corporal Peyronie's plaques, no scarring, no warts. No balanitis, no meatal stenosis.  Prostate: Prostate 3 + size. Left lobe normal consistency, right lobe normal consistency. Symmetrical lobes. No prostate nodule. Left lobe no tenderness, right lobe no tenderness.   Seminal Vesicles: Nonpalpable.  Sphincter Tone: Normal sphincter. No rectal tenderness. No rectal mass.    MULTI-SYSTEM PHYSICAL EXAMINATION:    Constitutional: Thin. No physical deformities. Normally developed. Good grooming.   Neck: Neck symmetrical, not swollen. Normal tracheal position.  Respiratory: No labored breathing, no use of accessory muscles.   Cardiovascular: Normal temperature, normal extremity pulses, no swelling, no varicosities.  Lymphatic: No enlargement of neck, axillae, groin.  Skin: No paleness, no jaundice, no cyanosis. No lesion, no ulcer, no rash.  Neurologic / Psychiatric: Oriented to time, oriented to place, oriented to person. No depression, no anxiety, no agitation.  Gastrointestinal: No mass, no tenderness, no rigidity,  non obese abdomen.  Eyes: Normal conjunctivae. Normal eyelids.  Ears, Nose, Mouth, and Throat: Left ear no scars, no lesions, no masses. Right ear no scars, no lesions, no masses. Nose no scars, no lesions, no masses. Normal hearing. Normal lips.  Musculoskeletal: Normal gait and station of head and neck.     PAST DATA REVIEWED:  Source Of History:  Patient  Records Review:   Previous Patient Records  Urodynamics Review:   Review Flow Rate  X-Ray Review: Prostate Ultrasound: Reviewed Films. Reviewed Report. Discussed With Patient.  PVR: Reviewed Films. Reviewed Report. Discussed With Patient.     04/09/16  Urinalysis  Urine Appearance Clear   Urine Color Amber   Urine Glucose Neg   Urine Bilirubin Neg   Urine Ketones Neg   Urine Specific Gravity 1.025   Urine Blood Neg   Urine pH 6.0   Urine Protein Neg   Urine Urobilinogen 1.0   Urine Nitrites Neg   Urine Leukocyte Esterase Neg    PROCEDURES:         Flexible Cystoscopy - 52000  Risks, benefits, and some of the potential complications of the procedure were discussed at length with the patient including infection, bleeding, voiding discomfort, urinary retention, fever, chills, sepsis, and others. All questions were answered. Informed consent was  obtained. Antibiotic prophylaxis was given. Sterile technique and intraurethral analgesia were used.  Given Cipro prophylactic.    Meatus:  Normal size. Normal location. Normal condition.  Urethra:  No strictures.  External Sphincter:  Normal.  Verumontanum:  Normal.  Prostate:  Non-obstructing. No hyperplasia.  Bladder Neck:  Non-obstructing.  Ureteral Orifices:  Normal location. Normal size. Normal shape. Effluxed clear urine.  Bladder:  No trabeculation. No tumors. Normal mucosa. No stones.      The lower urinary tract was carefully examined. The procedure was well-tolerated and without complications. Antibiotic instructions were given. Instructions were given to call the  office immediately for bloody urine, difficulty urinating, urinary retention, painful or frequent urination, fever, chills, nausea, vomiting or other illness. The patient stated that he understood these instructions and would comply with them.          Flow Rate - 51741  Flow Time: 0:44 min:sec  Voided Volume: 373 cc  Peak Flow Rate: 13 cc/sec  Time of Peak Flow: 0:11 min:sec  Average Flow Rate: 8 cc/sec  Total Void Time: 0:45 min:sec           PVR Ultrasound - [id number]  Scanned Volume: 99.16 cc         Urinalysis Dipstick Dipstick Cont'd  Color: Amber Bilirubin: Neg  Appearance: Clear Ketones: Neg  Specific Gravity: 1.025 Blood: Neg  pH: 6.0 Protein: Neg  Glucose: Neg Urobilinogen: 1.0    Nitrites: Neg    Leukocyte Esterase: Neg    ASSESSMENT:      ICD-10 Details  1 GU:   BPH w/LUTS - N40.1           Notes:   Mr. Laffitte is a 76 male, referred by Dr. Ezequiel Essex, for BPH, and continued lower tract symptoms, despite double dose Solution. PSA is 1.2 to, but calculated AUA symptom score sheet is 21/7; with symptoms of incomplete emptying, frequency ,urgency, intermittency, weak stream, and nocturia 2.  He returns today for cystoscopy, flow rate postvoid residual. Cystoscopy shows mild trabeculation, with some indentation of the lateral neck from the prostate. The prostate itself shows moderate bilobar BPH. There is no evidence of bladder stone, tumor, or diverticular formation. His prostate volume is small at 21.7 cc volume, and flow rate shows a peak of 13 cc/s, with a mean flow of 9 cc/s (normal. 25 cc).. His postvoid residual is 99.16 cc ( N < 50cc).  We discussed options for his therapy. Because he is already taking double dose tamsulosin, ( max medical therapy) I think he is going to need operative intervention. Minimally invasive treatment for him will be Urolift procedure, which would give him 5 years of voiding improvement. He could expect 50% decrease in his IPSS score, down  to approximately 12/7. He will need to have catheter for approximately 2 days. Should he fail this therapy, he could have any other therapy, such as laser treatment, or TURP. Should he develop prostate cancer in the future, he could have any therapy necessary. Also, this would offer him the least aggressive anesthetic, and easiest to recover from.  Other options would be laser prostatectomy, or standard TURP. The standard TURP is the the "gold" standard, and could be expected to last him for 15 years. The laser surgery would probably last 10-12 years, but both require more anesthesia. Standard TURP would probably require overnight hospitalization, but both the Urolift and the laser surgery could be done as outpatient, as long as he did well under anesthesia.(  patient lives in [place]).  Mr. Hinton desires to have case posted as Urolift for early January, as outpatient. OR sheet is completed. He has asked for a copy of note today to help explain his BPH problem. I will also send a copy to his physician.    PLAN:     Schedule Surgery          Document Letter(s):  Created for Patient: Clinical Summary       The information contained in this medical record document is considered private and confidential patient information. This information can only be used for the medical diagnosis and/or medical services that are being provided by the patient's selected caregivers. This information can only be distributed outside of the patient's care if the patient agrees and signs waivers of authorization for this information to be sent to an outside source or route.

## 2016-06-17 NOTE — Discharge Instructions (Addendum)
Post Anesthesia Home Care Instructions  Activity: Get plenty of rest for the remainder of the day. A responsible adult should stay with you for 24 hours following the procedure.  For the next 24 hours, DO NOT: -Drive a car -Paediatric nurse -Drink alcoholic beverages -Take any medication unless instructed by your physician -Make any legal decisions or sign important papers.  Meals: Start with liquid foods such as gelatin or soup. Progress to regular foods as tolerated. Avoid greasy, spicy, heavy foods. If nausea and/or vomiting occur, drink only clear liquids until the nausea and/or vomiting subsides. Call your physician if vomiting continues.  Special Instructions/Symptoms: Your throat may feel dry or sore from the anesthesia or the breathing tube placed in your throat during surgery. If this causes discomfort, gargle with warm salt water. The discomfort should disappear within 24 hours.  If you had a scopolamine patch placed behind your ear for the management of post- operative nausea and/or vomiting:  1. The medication in the patch is effective for 72 hours, after which it should be removed.  Wrap patch in a tissue and discard in the trash. Wash hands thoroughly with soap and water. 2. You may remove the patch earlier than 72 hours if you experience unpleasant side effects which may include dry mouth, dizziness or visual disturbances. 3. Avoid touching the patch. Wash your hands with soap and water after contact with the patch.   CYSTOSCOPY HOME CARE INSTRUCTIONS  Activity: Rest for the remainder of the day.  Do not drive or operate equipment today.  You may resume normal activities in one to two days as instructed by your physician.   Meals: Drink plenty of liquids and eat light foods such as gelatin or soup this evening.  You may return to a normal meal plan tomorrow.  Return to Work: You may return to work in one to two days or as instructed by your physician.  Special  Instructions / Symptoms: Call your physician if any of these symptoms occur:   -persistent or heavy bleeding  -bleeding which continues after first few urination  -large blood clots that are difficult to pass  -urine stream diminishes or stops completely  -fever equal to or higher than 101 degrees Farenheit.  -cloudy urine with a strong, foul odor  -severe pain  Females should always wipe from front to back after elimination.  You may feel some burning pain when you urinate.  This should disappear with time.  Applying moist heat to the lower abdomen or a hot tub bath may help relieve the pain. \  Follow-Up / Date of Return Visit to Your Physician:    Call for an appointment to arrange follow-up.    Foley Catheter Care, Adult A Foley catheter is a soft, flexible tube. This tube is placed into your bladder to drain pee (urine). If you go home with this catheter in place, follow the instructions below. TAKING CARE OF THE CATHETER 1. Wash your hands with soap and water. 2. Put soap and water on a clean washcloth.  Clean the skin where the tube goes into your body.  Clean away from the tube site.  Never wipe toward the tube.  Clean the area using a circular motion.  Remove all the soap. Pat the area dry with a clean towel. For males, reposition the skin that covers the end of the penis (foreskin). 3. Attach the tube to your leg with tape or a leg strap. Do not stretch the tube tight. If  you are using tape, remove any stickiness left behind by past tape you used. 4. Keep the drainage bag below your hips. Keep it off the floor. 5. Check your tube during the day. Make sure it is working and draining. Make sure the tube does not curl, twist, or bend. 6. Do not pull on the tube or try to take it out. TAKING CARE OF THE DRAINAGE BAGS You will have a large overnight drainage bag and a small leg bag. You may wear the overnight bag any time. Never wear the small bag at night. Follow the  directions below. Emptying the Drainage Bag  Empty your drainage bag when it is ?- full or at least 2-3 times a day. 1. Wash your hands with soap and water. 2. Keep the drainage bag below your hips. 3. Hold the dirty bag over the toilet or clean container. 4. Open the pour spout at the bottom of the bag. Empty the pee into the toilet or container. Do not let the pour spout touch anything. 5. Clean the pour spout with a gauze pad or cotton ball that has rubbing alcohol on it. 6. Close the pour spout. 7. Attach the bag to your leg with tape or a leg strap. 8. Wash your hands well. Changing the Drainage Bag  Change your bag once a month or sooner if it starts to smell or look dirty.  1. Wash your hands with soap and water. 2. Pinch the rubber tube so that pee does not spill out. 3. Disconnect the catheter tube from the drainage tube at the connection valve. Do not let the tubes touch anything. 4. Clean the end of the catheter tube with an alcohol wipe. Clean the end of a the drainage tube with a different alcohol wipe. 5. Connect the catheter tube to the drainage tube of the clean drainage bag. 6. Attach the new bag to the leg with tape or a leg strap. Avoid attaching the new bag too tightly. 7. Wash your hands well. Cleaning the Drainage Bag  1. Wash your hands with soap and water. 2. Wash the bag in warm, soapy water. 3. Rinse the bag with warm water. 4. Fill the bag with a mixture of white vinegar and water (1 cup vinegar to 1 quart warm water [.2 liter vinegar to 1 liter warm water]). Close the bag and soak it for 30 minutes in the solution. 5. Rinse the bag with warm water. 6. Hang the bag to dry with the pour spout open and hanging downward. 7. Store the clean bag (once it is dry) in a clean plastic bag. 8. Wash your hands well. PREVENT INFECTION  Wash your hands before and after touching your tube.  Take showers every day. Wash the skin where the tube enters your body. Do not  take baths. Replace wet leg straps with dry ones, if this applies.  Do not use powders, sprays, or lotions on the genital area. Only use creams, lotions, or ointments as told by your doctor.  For females, wipe from front to back after going to the bathroom.  Drink enough fluids to keep your pee clear or pale yellow unless you are told not to have too much fluid (fluid restriction).  Do not let the drainage bag or tubing touch or lie on the floor.  Wear cotton underwear to keep the area dry. GET HELP IF:  Your pee is cloudy or smells unusually bad.  Your tube becomes clogged.  You are not  draining pee into the bag or your bladder feels full.  Your tube starts to leak. GET HELP RIGHT AWAY IF:  You have pain, puffiness (swelling), redness, or yellowish-white fluid (pus) where the tube enters the body.  You have pain in the belly (abdomen), legs, lower back, or bladder.  You have a fever.  You see blood fill the tube, or your pee is pink or red.  You feel sick to your stomach (nauseous), throw up (vomit), or have chills.  Your tube gets pulled out. MAKE SURE YOU:   Understand these instructions.  Will watch your condition.  Will get help right away if you are not doing well or get worse. This information is not intended to replace advice given to you by your health care provider. Make sure you discuss any questions you have with your health care provider. Document Released: 08/21/2012 Document Revised: 05/17/2014 Document Reviewed: 04/12/2015 Elsevier Interactive Patient Education  2017 [employer].

## 2016-06-17 NOTE — Op Note (Signed)
Pre-operative diagnosis : BPH  Postoperative diagnosis: Same  Operation: Implantation of UroLift implants 4  Surgeon:  S. Gaynelle Arabian, MD  First assistant:  None  Anesthesia:  Gen. LMA  Preparation: After appropriate preanesthesia, the patient was brought to the operative room, placed on the operating table in the dorsal supine position and general LMA anesthesia was introduced. He was then replaced in the dorsal lithotomy position with the pubis was prepped with Betadine solution and draped in usual fashion. The arm band was double checked. The history was reviewed.  Review history:  New Patient  HPI: Paul Fry is a 70 year-old male established patient who is here today as a new patient for evaluation of BPH and lower tract symptoms.  He is a patient of Dr. Damita Dunnings seen in office consultation today. The patient complains of lower urinary tract symptom(s) that include frequency, urgency, hesitancy, weak stream, intermittency, straining, nocturia, and sense of incomplete emptying. The patient states if he were to spend the rest of his life with his current urinary condition, he would be mixed. Patient is currently treated with Tamsulosin double dose. for his symptoms.   01/20/16 PSA - 1.22  2016 PSA - 1.01  2014 PSA - 0.85     AUA Symptom Score: More than 50% of the time he has the sensation of not emptying his bladder completely when finished urinating. More than 50% of the time he has to urinate again fewer than two hours after he has finished urinating. 50% of the time he has to start and stop again several times when he urinates. Less than 50% of the time he finds it difficult to postpone urination. 50% of the time he has a weak urinary stream. 50% of the time he has to push or strain to begin urination. He has to get up to urinate 2 times from the time he goes to bed until the time he gets up in the morning.   Calculated AUA Symptom Score: 21      Statement of  Likelihood  of Success: Excellent. TIME-OUT observed.:  Procedure: Cystourethroscopy with a Compass, showing bilobar BPH. There was no median lobe noted. Trabeculation was noted (moderate). There was no sign will formation. There was no bladder stone or tumor formation. Clear reflux was noted from both ureteral orifices.  Using the UroLift implant device, 2 separate implants were placed, 2 cm distal to the bladder neck, on the right and left sides, at the 10:00 and 2:00 positions. Repeat cystoscopyshowed continued obstructing tissue on the right side, requiring 2 more Urolift implants on the right side of the prostate. Cystoscopy then revealed patent prostatic fossa. Minimal bleeding was noted. A size 16 Foley catheter with 5 mL within the balloon (sterile water) was placed without difficulty.  The patient was awakened and taken to recovery room in good condition.

## 2016-06-21 ENCOUNTER — Encounter (HOSPITAL_BASED_OUTPATIENT_CLINIC_OR_DEPARTMENT_OTHER): Payer: Self-pay | Admitting: Urology

## 2016-06-21 DIAGNOSIS — N401 Enlarged prostate with lower urinary tract symptoms: Secondary | ICD-10-CM | POA: Diagnosis not present

## 2016-06-21 DIAGNOSIS — R350 Frequency of micturition: Secondary | ICD-10-CM | POA: Diagnosis not present

## 2016-07-19 ENCOUNTER — Ambulatory Visit: Payer: Medicare Other

## 2016-07-19 ENCOUNTER — Other Ambulatory Visit: Payer: Self-pay | Admitting: Family Medicine

## 2016-07-19 ENCOUNTER — Other Ambulatory Visit (INDEPENDENT_AMBULATORY_CARE_PROVIDER_SITE_OTHER): Payer: Medicare Other

## 2016-07-19 DIAGNOSIS — R739 Hyperglycemia, unspecified: Secondary | ICD-10-CM

## 2016-07-19 DIAGNOSIS — I10 Essential (primary) hypertension: Secondary | ICD-10-CM | POA: Diagnosis not present

## 2016-07-19 DIAGNOSIS — Z119 Encounter for screening for infectious and parasitic diseases, unspecified: Secondary | ICD-10-CM | POA: Diagnosis not present

## 2016-07-19 LAB — HEMOGLOBIN A1C: HEMOGLOBIN A1C: 6.2 % (ref 4.6–6.5)

## 2016-07-19 LAB — LIPID PANEL
Cholesterol: 149 mg/dL (ref 0–200)
HDL: 33.2 mg/dL — ABNORMAL LOW (ref >39.00)
LDL Cholesterol: 96 mg/dL (ref 0–99)
NONHDL: 115.32
Total CHOL/HDL Ratio: 4
Triglycerides: 99 mg/dL (ref 0.0–149.0)
VLDL: 19.8 mg/dL (ref 0.0–40.0)

## 2016-07-19 LAB — COMPREHENSIVE METABOLIC PANEL
ALK PHOS: 57 U/L (ref 39–117)
ALT: 19 U/L (ref 0–53)
AST: 19 U/L (ref 0–37)
Albumin: 3.9 g/dL (ref 3.5–5.2)
BILIRUBIN TOTAL: 0.6 mg/dL (ref 0.2–1.2)
BUN: 12 mg/dL (ref 6–23)
CO2: 32 mEq/L (ref 19–32)
Calcium: 9.5 mg/dL (ref 8.4–10.5)
Chloride: 107 mEq/L (ref 96–112)
Creatinine, Ser: 0.93 mg/dL (ref 0.40–1.50)
GFR: 103.1 mL/min (ref >60.00)
GLUCOSE: 120 mg/dL — AB (ref 70–99)
Potassium: 4.5 mEq/L (ref 3.5–5.1)
SODIUM: 143 meq/L (ref 135–145)
TOTAL PROTEIN: 6.9 g/dL (ref 6.0–8.3)

## 2016-07-19 LAB — HEPATITIS C ANTIBODY: HCV Ab: NEGATIVE

## 2016-07-27 ENCOUNTER — Ambulatory Visit (INDEPENDENT_AMBULATORY_CARE_PROVIDER_SITE_OTHER): Payer: Medicare Other | Admitting: Family Medicine

## 2016-07-27 ENCOUNTER — Encounter: Payer: Self-pay | Admitting: Family Medicine

## 2016-07-27 VITALS — BP 114/64 | HR 76 | Temp 98.4°F | Ht 71.5 in | Wt 212.5 lb

## 2016-07-27 DIAGNOSIS — I10 Essential (primary) hypertension: Secondary | ICD-10-CM | POA: Diagnosis not present

## 2016-07-27 DIAGNOSIS — E7800 Pure hypercholesterolemia, unspecified: Secondary | ICD-10-CM

## 2016-07-27 DIAGNOSIS — K219 Gastro-esophageal reflux disease without esophagitis: Secondary | ICD-10-CM | POA: Diagnosis not present

## 2016-07-27 DIAGNOSIS — Z7189 Other specified counseling: Secondary | ICD-10-CM

## 2016-07-27 DIAGNOSIS — M25531 Pain in right wrist: Secondary | ICD-10-CM

## 2016-07-27 DIAGNOSIS — I429 Cardiomyopathy, unspecified: Secondary | ICD-10-CM

## 2016-07-27 DIAGNOSIS — R739 Hyperglycemia, unspecified: Secondary | ICD-10-CM | POA: Diagnosis not present

## 2016-07-27 DIAGNOSIS — Z0000 Encounter for general adult medical examination without abnormal findings: Secondary | ICD-10-CM

## 2016-07-27 DIAGNOSIS — R399 Unspecified symptoms and signs involving the genitourinary system: Secondary | ICD-10-CM

## 2016-07-27 MED ORDER — CARVEDILOL 3.125 MG PO TABS: 3.1250 mg | ORAL_TABLET | Freq: Two times a day (BID) | ORAL | 3 refills | Status: DC

## 2016-07-27 MED ORDER — OMEPRAZOLE 20 MG PO CPDR: 20.0000 mg | DELAYED_RELEASE_CAPSULE | Freq: Two times a day (BID) | ORAL | 3 refills | Status: DC

## 2016-07-27 MED ORDER — LOSARTAN POTASSIUM 25 MG PO TABS: 25.0000 mg | ORAL_TABLET | Freq: Every morning | ORAL | 3 refills | Status: DC

## 2016-07-27 MED ORDER — TAMSULOSIN HCL 0.4 MG PO CAPS: 0.8000 mg | ORAL_CAPSULE | Freq: Every morning | ORAL | Status: DC

## 2016-07-27 MED ORDER — ATORVASTATIN CALCIUM 20 MG PO TABS: 20.0000 mg | ORAL_TABLET | Freq: Every morning | ORAL | 3 refills | Status: DC

## 2016-07-27 NOTE — Patient Instructions (Addendum)
Don't change your meds for know.  Get a hard brace for your wrist and see if that helps.   Update me as needed.  Take care.  Glad to see you. Keep working on diet and exercise.  We'll recheck your labs yearly before a physical.

## 2016-07-27 NOTE — Progress Notes (Signed)
Pre visit review using our clinic review tool, if applicable. No additional management support is needed unless otherwise documented below in the visit note. 

## 2016-07-27 NOTE — Progress Notes (Signed)
I have personally reviewed the Medicare Annual Wellness questionnaire and have noted 1. The patient's medical and social history 2. Their use of alcohol, tobacco or illicit drugs 3. Their current medications and supplements 4. The patient's functional ability including ADL's, fall risks, home safety risks and hearing or visual             impairment. 5. Diet and physical activities 6. Evidence for depression or mood disorders  The patients weight, height, BMI have been recorded in the chart and visual acuity is per eye clinic.  I have made referrals, counseling and provided education to the patient based review of the above and I have provided the pt with a written personalized care plan for preventive services.  Provider list updated- see scanned forms.  Routine anticipatory guidance given to patient.  See health maintenance. The possibility exists that previously documented standard health maintenance information may have been brought forward from a previous encounter into this note.  If needed, that same information has been updated to reflect the current situation based on today's encounter.    Flu encouraged Shingles 2013 PNA 2017 Tetanus 2010 Colonoscopy 2014 PSA 1.22 in 2017, d/w pt pt.  LUTS improved with procedure pre urology.   Advance directive- wife designated if patient were incapacitated.   Cognitive function addressed- see scanned forms- and if abnormal then additional documentation follows.  HCV screening neg, d/w pt.   Elevated Cholesterol: Using medications without problems:yes Muscle aches: no Diet compliance:yes Exercise:yes  Hypertension:    Using medication without problems or lightheadedness: yes Chest pain with exertion:no Edema:no Short of breath:no Labs d/w pt.   Mild hyperglycemia, no DM2 by A1c.  Labs d/w pt.  Diet and exercise d/w pt.    R wrist pain in the last few months.  No trauma.  More pain with working in the yard.  Has a soft brace and that  helps some, wears during yardwork.  He wears it intermittently.  Rest helps more than anything.  R handed.  Pain across the wrist not just at one spot.  Not clearly worse with weather change.    GERD controlled on PPI.  No ADE on med.    BPH per urology.  I'll defer.  He agrees.    PMH and SH reviewed  Meds, vitals, and allergies reviewed.   ROS: Per HPI.  Unless specifically indicated otherwise in HPI, the patient denies:  General: fever. Eyes: acute vision changes ENT: sore throat Cardiovascular: chest pain Respiratory: SOB GI: vomiting GU: dysuria Musculoskeletal: acute back pain Derm: acute rash Neuro: acute motor dysfunction Psych: worsening mood Endocrine: polydipsia Heme: bleeding Allergy: hayfever  GEN: nad, alert and oriented HEENT: mucous membranes moist NECK: supple w/o LA CV: rrr. PULM: ctab, no inc wob ABD: soft, +bs EXT: no edema SKIN: no acute rash R wrist with pain on ROM but no tendon deficit or specific tendinitis pain that I can elicit on exam. Distally neurovascularly intact. Grip normal.

## 2016-07-28 DIAGNOSIS — M25531 Pain in right wrist: Secondary | ICD-10-CM | POA: Insufficient documentation

## 2016-07-28 NOTE — Assessment & Plan Note (Signed)
Controlled. Continue PPI. No adverse effect on medication. He agrees.

## 2016-07-28 NOTE — Assessment & Plan Note (Signed)
Flu encouraged Shingles 2013 PNA 2017 Tetanus 2010 Colonoscopy 2014 PSA 1.22 in 2017, d/w pt pt.  LUTS improved with procedure per urology.   Advance directive- wife designated if patient were incapacitated.   Cognitive function addressed- see scanned forms- and if abnormal then additional documentation follows.  HCV screening neg, d/w pt.

## 2016-07-28 NOTE — Assessment & Plan Note (Signed)
Not diabetic by labs. Continue work on diet and exercise. Labs discussed with patient. He agrees.

## 2016-07-28 NOTE — Assessment & Plan Note (Signed)
Per u rology 

## 2016-07-28 NOTE — Assessment & Plan Note (Signed)
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 Worse when he works in the yard. Imaging unlikely to change management at this point. Discussed with patient. He can wear a heavier brace to reinforce the wrist when working in the yard and update me as needed. He agrees.

## 2016-07-28 NOTE — Assessment & Plan Note (Signed)
Controlled. Continue as is. Labs discussed with patient. Continue work on diet and exercise.

## 2016-07-28 NOTE — Assessment & Plan Note (Signed)
Advance directive- wife designated if patient were incapacitated.  

## 2016-07-28 NOTE — Assessment & Plan Note (Signed)
Controlled. Continue as is. Continue work on diet and exercise. He agrees.

## 2016-08-02 DIAGNOSIS — R3912 Poor urinary stream: Secondary | ICD-10-CM | POA: Diagnosis not present

## 2016-08-02 DIAGNOSIS — N401 Enlarged prostate with lower urinary tract symptoms: Secondary | ICD-10-CM | POA: Diagnosis not present

## 2016-09-09 DIAGNOSIS — Z961 Presence of intraocular lens: Secondary | ICD-10-CM | POA: Diagnosis not present

## 2016-09-09 DIAGNOSIS — H524 Presbyopia: Secondary | ICD-10-CM | POA: Diagnosis not present

## 2016-10-01 DIAGNOSIS — N401 Enlarged prostate with lower urinary tract symptoms: Secondary | ICD-10-CM | POA: Diagnosis not present

## 2016-10-01 DIAGNOSIS — R3912 Poor urinary stream: Secondary | ICD-10-CM | POA: Diagnosis not present

## 2016-12-22 ENCOUNTER — Inpatient Hospital Stay (HOSPITAL_COMMUNITY)
Admission: EM | Admit: 2016-12-22 | Discharge: 2016-12-24 | DRG: 378 | Disposition: A | Discharge status: Home / Self Care | Payer: Medicare Other | Attending: Internal Medicine | Admitting: Internal Medicine

## 2016-12-22 ENCOUNTER — Encounter (HOSPITAL_COMMUNITY): Payer: Self-pay

## 2016-12-22 DIAGNOSIS — Z79899 Other long term (current) drug therapy: Secondary | ICD-10-CM

## 2016-12-22 DIAGNOSIS — N400 Enlarged prostate without lower urinary tract symptoms: Secondary | ICD-10-CM | POA: Diagnosis present

## 2016-12-22 DIAGNOSIS — Z9841 Cataract extraction status, right eye: Secondary | ICD-10-CM

## 2016-12-22 DIAGNOSIS — K219 Gastro-esophageal reflux disease without esophagitis: Secondary | ICD-10-CM | POA: Diagnosis present

## 2016-12-22 DIAGNOSIS — E119 Type 2 diabetes mellitus without complications: Secondary | ICD-10-CM | POA: Diagnosis present

## 2016-12-22 DIAGNOSIS — I2510 Atherosclerotic heart disease of native coronary artery without angina pectoris: Secondary | ICD-10-CM | POA: Diagnosis present

## 2016-12-22 DIAGNOSIS — I428 Other cardiomyopathies: Secondary | ICD-10-CM | POA: Diagnosis present

## 2016-12-22 DIAGNOSIS — Z9842 Cataract extraction status, left eye: Secondary | ICD-10-CM

## 2016-12-22 DIAGNOSIS — D62 Acute posthemorrhagic anemia: Secondary | ICD-10-CM | POA: Diagnosis not present

## 2016-12-22 DIAGNOSIS — K922 Gastrointestinal hemorrhage, unspecified: Secondary | ICD-10-CM | POA: Diagnosis not present

## 2016-12-22 DIAGNOSIS — K5731 Diverticulosis of large intestine without perforation or abscess with bleeding: Principal | ICD-10-CM | POA: Diagnosis present

## 2016-12-22 DIAGNOSIS — Z961 Presence of intraocular lens: Secondary | ICD-10-CM | POA: Diagnosis present

## 2016-12-22 DIAGNOSIS — Z8719 Personal history of other diseases of the digestive system: Secondary | ICD-10-CM

## 2016-12-22 DIAGNOSIS — E7800 Pure hypercholesterolemia, unspecified: Secondary | ICD-10-CM | POA: Diagnosis present

## 2016-12-22 DIAGNOSIS — I10 Essential (primary) hypertension: Secondary | ICD-10-CM | POA: Diagnosis present

## 2016-12-22 DIAGNOSIS — Z86010 Personal history of colonic polyps: Secondary | ICD-10-CM

## 2016-12-22 DIAGNOSIS — K625 Hemorrhage of anus and rectum: Secondary | ICD-10-CM

## 2016-12-22 LAB — CBC WITH DIFFERENTIAL/PLATELET
BASOS ABS: 0 10*3/uL (ref 0.0–0.1)
BASOS PCT: 1 %
Eosinophils Absolute: 0.1 10*3/uL (ref 0.0–0.7)
Eosinophils Relative: 2 %
HEMATOCRIT: 36 % — AB (ref 39.0–52.0)
HEMOGLOBIN: 11.4 g/dL — AB (ref 13.0–17.0)
LYMPHS PCT: 45 %
Lymphs Abs: 3.2 10*3/uL (ref 0.7–4.0)
MCH: 24.8 pg — ABNORMAL LOW (ref 26.0–34.0)
MCHC: 31.7 g/dL (ref 30.0–36.0)
MCV: 78.4 fL (ref 78.0–100.0)
Monocytes Absolute: 0.4 10*3/uL (ref 0.1–1.0)
Monocytes Relative: 6 %
NEUTROS ABS: 3.2 10*3/uL (ref 1.7–7.7)
NEUTROS PCT: 46 %
Platelets: 299 10*3/uL (ref 150–400)
RBC: 4.59 MIL/uL (ref 4.22–5.81)
RDW: 17.1 % — ABNORMAL HIGH (ref 11.5–15.5)
WBC: 7.1 10*3/uL (ref 4.0–10.5)

## 2016-12-22 LAB — COMPREHENSIVE METABOLIC PANEL
ALBUMIN: 3.6 g/dL (ref 3.5–5.0)
ALT: 25 U/L (ref 17–63)
AST: 24 U/L (ref 15–41)
Alkaline Phosphatase: 55 U/L (ref 38–126)
Anion gap: 6 (ref 5–15)
BILIRUBIN TOTAL: 0.6 mg/dL (ref 0.3–1.2)
BUN: 15 mg/dL (ref 6–20)
CO2: 27 mmol/L (ref 22–32)
CREATININE: 0.92 mg/dL (ref 0.61–1.24)
Calcium: 8.7 mg/dL — ABNORMAL LOW (ref 8.9–10.3)
Chloride: 105 mmol/L (ref 101–111)
GFR calc Af Amer: >60 mL/min (ref >60)
GLUCOSE: 117 mg/dL — AB (ref 65–99)
Potassium: 4 mmol/L (ref 3.5–5.1)
Sodium: 138 mmol/L (ref 135–145)
TOTAL PROTEIN: 6.9 g/dL (ref 6.5–8.1)

## 2016-12-22 LAB — PROTIME-INR
INR: 1.05
PROTHROMBIN TIME: 13.7 s (ref 11.4–15.2)

## 2016-12-22 NOTE — H&P (Signed)
History and Physical    Paul Fry:[id number] DOB: 1946/04/29 DOA: 12/22/2016  PCP: [name], MD   Gastroenterologist: Lucio Edward, M.D.  Patient coming from: Home.  I have personally briefly reviewed patient's old medical records in [place]  Chief Complaint: Rectal bleeding.  HPI: Paul Fry is a 71 y.o. male with medical history significant of BPH, GERD, hiatal hernia, history of adenomatous polyps. History of chronic gastritis and H. pylori infection, hyperlipidemia, hypertension, nonischemic cardiomyopathy and type 2 diabetes, history of diverticular bleed in 2010 and 2011 who is coming to the emergency department with complaints of melena since Saturday and rectal bleeding since earlier today. He denies abdominal pain, nausea, emesis, diarrhea or constipation. He denies chest pain, palpitations, diaphoresis, PND, orthopnea, but complains of dizziness when getting out of bed or standing up from a chair. He mentions that he had mild lower extremity edema last month, which is the first time that this has happened, but has not had it again since then. He denies dysuria, frequency, hematuria, polyuria, polydipsia, polyphagia or blurred vision. No skin rashes or pruritus.   ED Course: Initial vital signs in the emergency department the patient 98.58F, pulse 57, blood pressure is 135/88 mmHg, respirations 18 O2 sat 97% on room air. Physical exam done by Dr. Billy Fischer showed gross blood clots on rectal examination. He was given Protonix 40 mg IVPB 1 dose. PT and INR normal. WBC is 7.1, hemoglobin 11.4 g/dL (decreased from baseline is around 13.5-14.0) and platelets 299. His CMP shows a glucose of 117 and calcium of 8.7 mg/dL, but he was otherwise normal.  Review of Systems: As per HPI otherwise 10 point review of systems negative.    Past Medical History:  Diagnosis Date  . BPH (benign prostatic hyperplasia)   . Diverticulosis of colon   . Elevated PSA    1.22 on 01-20-2016  . Feeling of incomplete bladder emptying   . GERD (gastroesophageal reflux disease)   . Hiatal hernia   . History of adenomatous polyp of colon    2014 tubular adenoma  . History of chronic gastritis   . History of Helicobacter pylori infection    07/ 2016  . History of lower GI bleeding    07/ 2010 and 12/ 2011  diverticular bleed both times  . Hyperlipidemia   . Hypertension   . Lower urinary tract symptoms (LUTS)   . Nonischemic cardiomyopathy ([place])   . Plantar fascia syndrome   . Strains to urinate    INTERMITTANT  . Type 2 diabetes, diet controlled ([place])   . Wears glasses     Past Surgical History:  Procedure Laterality Date  . APPENDECTOMY  teen  . CARDIAC CATHETERIZATION  09-10-2008   dr dalton mclean   mild non-obstructive CAD;  30-40% mRCA, 20%pLAD,  ef 55%  . CARDIOVASCULAR STRESS TEST  07/29/2011   Low risk nuclear study w/ no ischemia or infarct/  LVEF 47% and  apical hypokinesis (08-07-2012 cardiac MRI -- EF 49% w/ no hyperenhancement distal septal and apical hypokinesis, mild LAE, mild RVE, mild AR with mild dilated sinus 48mm)  . CATARACT EXTRACTION W/ INTRAOCULAR LENS  IMPLANT, BILATERAL  2016  . COLONOSCOPY  last one 12-21-2012  . CYSTOSCOPY WITH INSERTION OF UROLIFT N/A 06/17/2016   Procedure: CYSTOSCOPY WITH INSERTION OF UROLIFT;  Surgeon: [name], MD;  Location: [facility];  Service: Urology;  Laterality: N/A;  . ESOPHAGOGASTRODUODENOSCOPY  last one 11-22-2014  . TRANSTHORACIC  ECHOCARDIOGRAM  09/17/2014   ef 50-55%/  trivial AR, MR and PR/  mild LAE and RAE/  mild TR     reports that he has never smoked. He has never used smokeless tobacco. He reports that he drinks alcohol. He reports that he does not use drugs.  No Known Allergies  Family History  Problem Relation Age of Onset  . Colon polyps Brother   . Colon cancer Mother 9  . Lung cancer Brother   . Heart disease Sister        MI, pace maker  .  Kidney disease Sister        renal failure, nephrectomy- not cancer  . Hypertension Sister   . Hypertension Sister   . Diabetes Neg Hx        DM  . Depression Neg Hx   . Stroke Neg Hx   . Alcohol abuse Neg Hx        also no drug abuse   . Prostate cancer Neg Hx     Prior to Admission medications   Medication Sig Start Date End Date Taking? Authorizing Provider  ascorbic acid (VITAMIN C) 1000 MG tablet Take 1,000 mg by mouth daily.   Yes [provider]  atorvastatin (LIPITOR) 20 MG tablet Take 1 tablet (20 mg total) by mouth every morning. 07/27/16  Yes [name], MD  calcium carbonate (TUMS) 500 MG chewable tablet Chew 1 tablet by mouth as needed for indigestion. Reported on 07/17/2015   Yes [provider]  carvedilol (COREG) 3.125 MG tablet Take 1 tablet (3.125 mg total) by mouth 2 (two) times daily. 07/27/16  Yes [name], MD  cetirizine (ZYRTEC) 10 MG tablet Take 10 mg by mouth daily as needed for allergies.    Yes [provider]  fish oil-omega-3 fatty acids 1000 MG capsule Take 1 g by mouth daily.   Yes [provider]  losartan (COZAAR) 25 MG tablet Take 1 tablet (25 mg total) by mouth every morning. 07/27/16  Yes [name], MD  Multiple Vitamin (MULTIVITAMIN) tablet Take 1 tablet by mouth daily.   Yes [provider]  omeprazole (PRILOSEC) 20 MG capsule Take 1 capsule (20 mg total) by mouth 2 (two) times daily. 07/27/16 09/10/17 Yes [name], MD  tamsulosin (FLOMAX) 0.4 MG CAPS capsule Take 2 capsules (0.8 mg total) by mouth every morning. 07/27/16  Yes [name], MD    Physical Exam: Vitals:   12/22/16 2056 12/22/16 2057  BP: 135/88   Pulse: (!) 57   Resp: 18   Temp: 98 F (36.7 C)   TempSrc: Oral   SpO2: 97%   Weight:  93 kg (205 lb)  Height:  6' (1.829 m)    Constitutional: NAD, calm, comfortable Eyes: PERRL, lids and conjunctivae normal ENMT: Mucous membranes are moist. Posterior pharynx  clear of any exudate or lesions.Normal dentition.  Neck: normal, supple, no masses, no thyromegaly Respiratory: clear to auscultation bilaterally, no wheezing, no crackles. Normal respiratory effort. No accessory muscle use.  Cardiovascular: Regular rate and rhythm, no murmurs / rubs / gallops. No extremity edema. 2+ pedal pulses. No carotid bruits.  Abdomen: no tenderness, no masses palpated. No hepatosplenomegaly. Bowel sounds positive.  Musculoskeletal: no clubbing / cyanosis. Good ROM, no contractures. Normal muscle tone.  Skin: no rashes, lesions, ulcers. No induration Neurologic: CN 2-12 grossly intact. Sensation intact, DTR normal. Strength 5/5 in all 4.  Psychiatric: Normal judgment and insight. Alert and  oriented x 4. Normal mood.    Labs on Admission: I have personally reviewed following labs and imaging studies  CBC:  Recent Labs Lab 12/22/16 2231  WBC 7.1  NEUTROABS 3.2  HGB 11.4*  HCT 36.0*  MCV 78.4  PLT 509   Basic Metabolic Panel:  Recent Labs Lab 12/22/16 2231  NA 138  K 4.0  CL 105  CO2 27  GLUCOSE 117*  BUN 15  CREATININE 0.92  CALCIUM 8.7*   GFR: Estimated Creatinine Clearance: 80.8 mL/min (by C-G formula based on SCr of 0.92 mg/dL). Liver Function Tests:  Recent Labs Lab 12/22/16 2231  AST 24  ALT 25  ALKPHOS 55  BILITOT 0.6  PROT 6.9  ALBUMIN 3.6   No results for input(s): LIPASE, AMYLASE in the last 168 hours. No results for input(s): AMMONIA in the last 168 hours. Coagulation Profile:  Recent Labs Lab 12/22/16 2231  INR 1.05   Cardiac Enzymes: No results for input(s): CKTOTAL, CKMB, CKMBINDEX, TROPONINI in the last 168 hours. BNP (last 3 results) No results for input(s): PROBNP in the last 8760 hours. HbA1C: No results for input(s): HGBA1C in the last 72 hours. CBG: No results for input(s): GLUCAP in the last 168 hours. Lipid Profile: No results for input(s): CHOL, HDL, LDLCALC, TRIG, CHOLHDL, LDLDIRECT in the last 72  hours. Thyroid Function Tests: No results for input(s): TSH, T4TOTAL, FREET4, T3FREE, THYROIDAB in the last 72 hours. Anemia Panel: No results for input(s): VITAMINB12, FOLATE, FERRITIN, TIBC, IRON, RETICCTPCT in the last 72 hours. Urine analysis:    Component Value Date/Time   BILIRUBINUR negative 01/20/2016 0908   PROTEINUR negative 01/20/2016 0908   UROBILINOGEN 1.0 01/20/2016 0908   NITRITE negative 01/20/2016 0908   LEUKOCYTESUR Negative 01/20/2016 0908    Radiological Exams on Admission: No results found.  EKG: Independently reviewed   Assessment/Plan Principal Problem:   Lower GI bleed Admit to telemetry /inpatient. Nothing by mouth. Continue IV fluids. Protonix 40 mg IV PB every 24 hours. Monitor hematocrit and hemoglobin. Consult gastroenterology in the morning.  Active Problems:   Anemia due to acute blood loss Monitor hematocrit and hemoglobin. Monitor blood pressure and vital signs.    GERD Protonix 40 mg IV PB every 24 hours.    Hypercholesteremia Continue atorvastatin 20 mg by mouth daily. The patient is also doing lifestyle modifications and taking omega-3 fatty acids. Monitor hepatic function panel as needed. Fasting lipid profile follow-up as an outpatient.    HTN (hypertension) Continue carvedilol 3.125 mg by mouth daily. Continue losartan 25 mg by mouth daily. Monitor blood pressure, renal function and electrolytes.    BPH  (benign prostatic hyperplasia) Continue Flomax 0.4 mg by mouth every day.    DVT prophylaxis: SCDs Code Status: Full code. Family Communication: Disposition Plan: Admit for H&H monitoring and GI evaluation in the morning. Consults called:  Admission status: Inpatient/Telemetry.   Reubin Milan MD [facility] Pager [phone].  If 7PM-7AM, please contact night-coverage [email or web address] Password [facility]  12/22/2016, 11:50 PM

## 2016-12-22 NOTE — ED Notes (Signed)
Bed: YG47 Expected date:  Expected time:  Means of arrival:  Comments: tr2

## 2016-12-22 NOTE — ED Provider Notes (Signed)
[facility] DEPT Provider Note   CSN: [id number] Arrival date & time: 12/22/16  2017     History   Chief Complaint Chief Complaint  Patient presents with  . Rectal Bleeding    HPI Paul Fry is a 71 y.o. male.  HPI   Presents with concern for blood in bowel movements. Initially started over the weekend, Saturday Sunday was dark black stool with foul odor, then this afternoon passed small blood clots, dark red color, not bright red.  No nausea or vomiting.  Similar bleeding this afternoon to prior in 2010 and 2011 when he had diverticular bleed and admission but it was worse then.  Has had one episode this afternoon.  No new abdominal pain, reports occasional pain with eating/reflux.  Felt lightheaded when bending over and standing up but not feeling more lightheaded today.  No diarrhea. Has seen Dr. Fuller Plan of GI  Past Medical History:  Diagnosis Date  . BPH (benign prostatic hyperplasia)   . Diverticulosis of colon   . Elevated PSA    1.22 on 01-20-2016  . Feeling of incomplete bladder emptying   . GERD (gastroesophageal reflux disease)   . Hiatal hernia   . History of adenomatous polyp of colon    2014 tubular adenoma  . History of chronic gastritis   . History of Helicobacter pylori infection    07/ 2016  . History of lower GI bleeding    07/ 2010 and 12/ 2011  diverticular bleed both times  . Hyperlipidemia   . Hypertension   . Lower urinary tract symptoms (LUTS)   . Nonischemic cardiomyopathy ([place])   . Plantar fascia syndrome   . Strains to urinate    INTERMITTANT  . Type 2 diabetes, diet controlled ([place])   . Wears glasses     Patient Active Problem List   Diagnosis Date Noted  . Lower GI bleed 12/22/2016  . Anemia due to acute blood loss 12/22/2016  . Wrist pain, acute, right 07/28/2016  . Lower urinary tract symptoms (LUTS) 11/19/2014  . Cough 10/23/2014  . Advance care planning 07/16/2014  . Hyperglycemia 07/16/2014  . HTN (hypertension)  07/09/2013  . Paresthesia 12/25/2012  . Cardiomyopathy ([place]) 09/26/2012  . ED (erectile dysfunction) 05/26/2012  . DOE (dyspnea on exertion) 06/18/2011  . Plantar fascia syndrome 05/26/2011  . Medicare annual wellness visit, subsequent 05/26/2011  . Hypercholesteremia 03/03/2011  . CAD, UNSPECIFIED SITE 10/14/2008  . GERD 10/14/2008  . DIVERTICULOSIS OF COLON 04/03/2008    Past Surgical History:  Procedure Laterality Date  . APPENDECTOMY  teen  . CARDIAC CATHETERIZATION  09-10-2008   dr dalton mclean   mild non-obstructive CAD;  30-40% mRCA, 20%pLAD,  ef 55%  . CARDIOVASCULAR STRESS TEST  07/29/2011   Low risk nuclear study w/ no ischemia or infarct/  LVEF 47% and  apical hypokinesis (08-07-2012 cardiac MRI -- EF 49% w/ no hyperenhancement distal septal and apical hypokinesis, mild LAE, mild RVE, mild AR with mild dilated sinus 15mm)  . CATARACT EXTRACTION W/ INTRAOCULAR LENS  IMPLANT, BILATERAL  2016  . COLONOSCOPY  last one 12-21-2012  . CYSTOSCOPY WITH INSERTION OF UROLIFT N/A 06/17/2016   Procedure: CYSTOSCOPY WITH INSERTION OF UROLIFT;  Surgeon: [name], MD;  Location: [facility];  Service: Urology;  Laterality: N/A;  . ESOPHAGOGASTRODUODENOSCOPY  last one 11-22-2014  . TRANSTHORACIC ECHOCARDIOGRAM  09/17/2014   ef 50-55%/  trivial AR, MR and PR/  mild LAE and RAE/  mild TR  Home Medications    Prior to Admission medications   Medication Sig Start Date End Date Taking? Authorizing Provider  ascorbic acid (VITAMIN C) 1000 MG tablet Take 1,000 mg by mouth daily.   Yes [provider]  atorvastatin (LIPITOR) 20 MG tablet Take 1 tablet (20 mg total) by mouth every morning. 07/27/16  Yes [name], MD  calcium carbonate (TUMS) 500 MG chewable tablet Chew 1 tablet by mouth as needed for indigestion. Reported on 07/17/2015   Yes [provider]  carvedilol (COREG) 3.125 MG tablet Take 1 tablet (3.125 mg total) by mouth 2 (two)  times daily. 07/27/16  Yes [name], MD  cetirizine (ZYRTEC) 10 MG tablet Take 10 mg by mouth daily as needed for allergies.    Yes [provider]  fish oil-omega-3 fatty acids 1000 MG capsule Take 1 g by mouth daily.   Yes [provider]  losartan (COZAAR) 25 MG tablet Take 1 tablet (25 mg total) by mouth every morning. 07/27/16  Yes [name], MD  Multiple Vitamin (MULTIVITAMIN) tablet Take 1 tablet by mouth daily.   Yes [provider]  omeprazole (PRILOSEC) 20 MG capsule Take 1 capsule (20 mg total) by mouth 2 (two) times daily. 07/27/16 09/10/17 Yes [name], MD  tamsulosin (FLOMAX) 0.4 MG CAPS capsule Take 2 capsules (0.8 mg total) by mouth every morning. 07/27/16  Yes [name], MD    Family History Family History  Problem Relation Age of Onset  . Colon polyps Brother   . Colon cancer Mother 52  . Lung cancer Brother   . Heart disease Sister        MI, pace maker  . Kidney disease Sister        renal failure, nephrectomy- not cancer  . Hypertension Sister   . Hypertension Sister   . Diabetes Neg Hx        DM  . Depression Neg Hx   . Stroke Neg Hx   . Alcohol abuse Neg Hx        also no drug abuse   . Prostate cancer Neg Hx     Social History Social History  Substance Use Topics  . Smoking status: Never Smoker  . Smokeless tobacco: Never Used  . Alcohol use Yes     Comment: rarely     Allergies   Patient has no known allergies.   Review of Systems Review of Systems  Constitutional: Negative for fever.  Respiratory: Negative for shortness of breath.   Cardiovascular: Negative for chest pain.  Gastrointestinal: Positive for anal bleeding and blood in stool. Negative for abdominal pain, constipation, diarrhea, nausea and vomiting.  Genitourinary: Negative for dysuria.  Skin: Negative for rash.  Neurological: Negative for syncope and light-headedness.     Physical Exam Updated Vital Signs BP (!) 126/59    Pulse (!) 57   Temp 98 F (36.7 C) (Oral)   Resp 12   Ht 6' (1.829 m)   Wt 93 kg (205 lb)   SpO2 97%   BMI 27.80 kg/m   Physical Exam  Constitutional: He is oriented to person, place, and time. He appears well-developed and well-nourished. No distress.  HENT:  Head: Normocephalic and atraumatic.  Eyes: Conjunctivae and EOM are normal.  Neck: Normal range of motion.  Cardiovascular: Normal rate, regular rhythm, normal heart sounds and intact distal pulses.  Exam reveals no gallop and no friction rub.   No murmur heard. Pulmonary/Chest:  Effort normal and breath sounds normal. No respiratory distress. He has no wheezes. He has no rales.  Abdominal: Soft. He exhibits no distension. There is no tenderness. There is no guarding.  Genitourinary:  Genitourinary Comments: Gross blood clots on rectal exam  Musculoskeletal: He exhibits no edema.  Neurological: He is alert and oriented to person, place, and time.  Skin: Skin is warm and dry. He is not diaphoretic.  Nursing note and vitals reviewed.    ED Treatments / Results  Labs (all labs ordered are listed, but only abnormal results are displayed) Labs Reviewed  CBC WITH DIFFERENTIAL/PLATELET - Abnormal; Notable for the following:       Result Value   Hemoglobin 11.4 (*)    HCT 36.0 (*)    MCH 24.8 (*)    RDW 17.1 (*)    All other components within normal limits  COMPREHENSIVE METABOLIC PANEL - Abnormal; Notable for the following:    Glucose, Bld 117 (*)    Calcium 8.7 (*)    All other components within normal limits  PROTIME-INR  TYPE AND SCREEN    EKG  EKG Interpretation None       Radiology No results found.  Procedures Procedures (including critical care time)  Medications Ordered in ED Medications  pantoprazole (PROTONIX) injection 40 mg (not administered)     Initial Impression / Assessment and Plan / ED Course  I have reviewed the triage vital signs and the nursing notes.  Pertinent labs & imaging  results that were available during my care of the patient were reviewed by me and considered in my medical decision making (see chart for details).     71 year old male with a history of hypertension, hyperlipidemia, GERD, BPH, history of GI bleed, thought to be diverticular with admission, presents with concern for rectal bleeding. Patient hemodynamically stable on arrival to the emergency department. His hemoglobin is decreased to 11.4, from 13 previously in February. He's had one episode of passing blood clots today, but otherwise has not had any continued bleeding. Bleeding he describes over the weekend sounds to be more upper GI bleeding or melena, however bleeding described today is most concerning for possible diverticular bleed. Given patient with frank blood clots on rectal exam, feel observation for further episodes of bleeding is appropriate given his history of diverticular bleeds, decrease in hgb and exam. Patient will be admitted for observation for continued care.  Final Clinical Impressions(s) / ED Diagnoses   Final diagnoses:  Rectal bleeding  History of GI diverticular bleed    New Prescriptions New Prescriptions   No medications on file     Gareth Morgan, MD 12/23/16 [phone]

## 2016-12-22 NOTE — ED Triage Notes (Signed)
Pt complains of dark red rectal bleeding for one day Pt denies any pain  Pt states he was admitted the last time this happened

## 2016-12-23 ENCOUNTER — Encounter (HOSPITAL_COMMUNITY): Payer: Self-pay | Admitting: Internal Medicine

## 2016-12-23 DIAGNOSIS — N400 Enlarged prostate without lower urinary tract symptoms: Secondary | ICD-10-CM | POA: Diagnosis present

## 2016-12-23 DIAGNOSIS — K922 Gastrointestinal hemorrhage, unspecified: Secondary | ICD-10-CM

## 2016-12-23 DIAGNOSIS — D62 Acute posthemorrhagic anemia: Secondary | ICD-10-CM

## 2016-12-23 LAB — CBC WITH DIFFERENTIAL/PLATELET
BASOS ABS: 0.1 10*3/uL (ref 0.0–0.1)
BASOS PCT: 1 %
EOS ABS: 0.1 10*3/uL (ref 0.0–0.7)
Eosinophils Relative: 2 %
HEMATOCRIT: 36.5 % — AB (ref 39.0–52.0)
HEMOGLOBIN: 11.5 g/dL — AB (ref 13.0–17.0)
Lymphocytes Relative: 46 %
Lymphs Abs: 2.7 10*3/uL (ref 0.7–4.0)
MCH: 24.4 pg — ABNORMAL LOW (ref 26.0–34.0)
MCHC: 31.5 g/dL (ref 30.0–36.0)
MCV: 77.3 fL — ABNORMAL LOW (ref 78.0–100.0)
MONOS PCT: 6 %
Monocytes Absolute: 0.3 10*3/uL (ref 0.1–1.0)
NEUTROS ABS: 2.6 10*3/uL (ref 1.7–7.7)
NEUTROS PCT: 45 %
Platelets: 297 10*3/uL (ref 150–400)
RBC: 4.72 MIL/uL (ref 4.22–5.81)
RDW: 16.9 % — ABNORMAL HIGH (ref 11.5–15.5)
WBC: 5.8 10*3/uL (ref 4.0–10.5)

## 2016-12-23 LAB — HEMOGLOBIN AND HEMATOCRIT, BLOOD
HCT: 36.7 % — ABNORMAL LOW (ref 39.0–52.0)
HEMOGLOBIN: 11.8 g/dL — AB (ref 13.0–17.0)

## 2016-12-23 LAB — COMPREHENSIVE METABOLIC PANEL
ALBUMIN: 3.5 g/dL (ref 3.5–5.0)
ALK PHOS: 50 U/L (ref 38–126)
ALT: 23 U/L (ref 17–63)
ANION GAP: 7 (ref 5–15)
AST: 23 U/L (ref 15–41)
BILIRUBIN TOTAL: 0.7 mg/dL (ref 0.3–1.2)
BUN: 13 mg/dL (ref 6–20)
CALCIUM: 8.8 mg/dL — AB (ref 8.9–10.3)
CO2: 27 mmol/L (ref 22–32)
CREATININE: 0.84 mg/dL (ref 0.61–1.24)
Chloride: 106 mmol/L (ref 101–111)
GFR calc Af Amer: >60 mL/min (ref >60)
GFR calc non Af Amer: >60 mL/min (ref >60)
GLUCOSE: 106 mg/dL — AB (ref 65–99)
Potassium: 3.9 mmol/L (ref 3.5–5.1)
SODIUM: 140 mmol/L (ref 135–145)
TOTAL PROTEIN: 6.7 g/dL (ref 6.5–8.1)

## 2016-12-23 LAB — TYPE AND SCREEN
ABO/RH(D): B POS
Antibody Screen: NEGATIVE

## 2016-12-23 MED ORDER — SODIUM CHLORIDE 0.9 % IV SOLN
INTRAVENOUS | Status: DC
  Administered 2016-12-23 (×2): via INTRAVENOUS

## 2016-12-23 MED ORDER — ONDANSETRON HCL 4 MG/2ML IJ SOLN: 4.0000 mg | Freq: Four times a day (QID) | INTRAMUSCULAR | Status: DC | PRN

## 2016-12-23 MED ORDER — CARVEDILOL 3.125 MG PO TABS
3.1250 mg | ORAL_TABLET | Freq: Two times a day (BID) | ORAL | Status: DC
  Administered 2016-12-23 (×2): 3.125 mg via ORAL
  Filled 2016-12-23 (×3): qty 1

## 2016-12-23 MED ORDER — TAMSULOSIN HCL 0.4 MG PO CAPS
0.8000 mg | ORAL_CAPSULE | Freq: Every morning | ORAL | Status: DC
  Administered 2016-12-23 – 2016-12-24 (×2): 0.8 mg via ORAL
  Filled 2016-12-23 (×2): qty 2

## 2016-12-23 MED ORDER — LORATADINE 10 MG PO TABS: 10.0000 mg | ORAL_TABLET | Freq: Every day | ORAL | Status: DC | PRN

## 2016-12-23 MED ORDER — PANTOPRAZOLE SODIUM 40 MG IV SOLR
40.0000 mg | Freq: Every day | INTRAVENOUS | Status: DC
  Administered 2016-12-23 (×2): 40 mg via INTRAVENOUS
  Filled 2016-12-23 (×2): qty 40

## 2016-12-23 MED ORDER — ATORVASTATIN CALCIUM 20 MG PO TABS
20.0000 mg | ORAL_TABLET | Freq: Every morning | ORAL | Status: DC
  Administered 2016-12-23 – 2016-12-24 (×2): 20 mg via ORAL
  Filled 2016-12-23 (×2): qty 1

## 2016-12-23 MED ORDER — LOSARTAN POTASSIUM 25 MG PO TABS
25.0000 mg | ORAL_TABLET | Freq: Every morning | ORAL | Status: DC
  Administered 2016-12-23: 25 mg via ORAL
  Filled 2016-12-23: qty 1

## 2016-12-23 MED ORDER — ONDANSETRON HCL 4 MG PO TABS: 4.0000 mg | ORAL_TABLET | Freq: Four times a day (QID) | ORAL | Status: DC | PRN

## 2016-12-23 NOTE — ED Notes (Signed)
Patient transported to room 1435 via stretcher on monitor , NSR, condition stable

## 2016-12-23 NOTE — Progress Notes (Signed)
Patient has tolerated clear liquid diet well. Pt reports having 1 dark/black stool today. Will continue to monitor

## 2016-12-23 NOTE — Progress Notes (Signed)
RN can call report to [facility] at 00:20 [phone]

## 2016-12-23 NOTE — Progress Notes (Signed)
PROGRESS NOTE    Paul Fry  KWI:[id number] DOB: 08/30/1945 DOA: 12/22/2016 PCP: [name], MD    Brief Narrative: Paul Fry is a 71 y.o. male with medical history significant of BPH, GERD, hiatal hernia, history of adenomatous polyps. History of chronic gastritis and H. pylori infection, hyperlipidemia, hypertension, nonischemic cardiomyopathy and type 2 diabetes, history of diverticular bleed in 2010 and 2011 who is coming to the emergency department with complaints of melena since Saturday and rectal bleeding since earlier today. He denies abdominal pain, nausea, emesis, diarrhea or constipation. He denies chest pain, palpitations, diaphoresis, PND, orthopnea, but complains of dizziness when getting out of bed or standing up from a chair. He mentions that he had mild lower extremity edema last month, which is the first time that this has happened, but has not had it again since then. He denies dysuria, frequency, hematuria, polyuria, polydipsia, polyphagia or blurred vision. No skin rashes or pruritus.   Assessment & Plan:   Principal Problem:   Lower GI bleed Active Problems:   GERD   Hypercholesteremia   HTN (hypertension)   Anemia due to acute blood loss   BPH (benign prostatic hyperplasia)  1-Acute blood loss anemia, GI bleed;  Report melena and bloody stool. Last episode last night.  Hb stable today at 11.  GI consulted.  Plan for clear diet.  IV protonix.   2-  GERD Protonix 40 mg IV PB every 24 hours.    Hypercholesteremia Continue atorvastatin 20 mg by mouth daily.     HTN (hypertension) Continue carvedilol 3.125 mg by mouth daily. Hold losartan.     BPH  (benign prostatic hyperplasia) Continue Flomax.      DVT prophylaxis: SCD Code Status: full code.  Family Communication: wife  Disposition Plan: to be determine.    Consultants:   GI   Procedures:   none   Antimicrobials:   \none   Subjective: He denies further BM.  Last bloody stool was yesterday night.  He has had some abdominal pain after he eats.  Report black stool last weekend.   Objective: Vitals:   12/23/16 0030 12/23/16 0110 12/23/16 0503 12/23/16 0852  BP: (!) 126/59 (!) 163/64 123/71 132/62  Pulse: (!) 57 (!) 56 66 60  Resp: 12  18   Temp:  98.7 F (37.1 C) 97.6 F (36.4 C)   TempSrc:  Oral Oral   SpO2:  98% 99%   Weight:  92.8 kg (204 lb 9.4 oz)    Height:  6' (1.829 m)      Intake/Output Summary (Last 24 hours) at 12/23/16 0913 Last data filed at 12/23/16 2426  Gross per 24 hour  Intake            46.67 ml  Output                0 ml  Net            46.67 ml   Filed Weights   12/22/16 2057 12/23/16 0110  Weight: 93 kg (205 lb) 92.8 kg (204 lb 9.4 oz)    Examination:  General exam: Appears calm and comfortable  Respiratory system: Clear to auscultation. Respiratory effort normal. Cardiovascular system: S1 & S2 heard, RRR. No JVD, murmurs, rubs, gallops or clicks. No pedal edema. Gastrointestinal system: Abdomen is nondistended, soft and nontender. No organomegaly or masses felt. Normal bowel sounds heard. Central nervous system: Alert and oriented. No focal neurological deficits. Extremities: Symmetric 5 x 5  power. Skin: No rashes, lesions or ulcers Psychiatry: Judgement and insight appear normal. Mood & affect appropriate.     Data Reviewed: I have personally reviewed following labs and imaging studies  CBC:  Recent Labs Lab 12/22/16 2231 12/23/16 0459  WBC 7.1  --   NEUTROABS 3.2  --   HGB 11.4* 11.8*  HCT 36.0* 36.7*  MCV 78.4  --   PLT 299  --    Basic Metabolic Panel:  Recent Labs Lab 12/22/16 2231 12/23/16 0459  NA 138 140  K 4.0 3.9  CL 105 106  CO2 27 27  GLUCOSE 117* 106*  BUN 15 13  CREATININE 0.92 0.84  CALCIUM 8.7* 8.8*   GFR: Estimated Creatinine Clearance: 88.5 mL/min (by C-G formula based on SCr of 0.84 mg/dL). Liver Function Tests:  Recent Labs Lab 12/22/16 2231  12/23/16 0459  AST 24 23  ALT 25 23  ALKPHOS 55 50  BILITOT 0.6 0.7  PROT 6.9 6.7  ALBUMIN 3.6 3.5   No results for input(s): LIPASE, AMYLASE in the last 168 hours. No results for input(s): AMMONIA in the last 168 hours. Coagulation Profile:  Recent Labs Lab 12/22/16 2231  INR 1.05   Cardiac Enzymes: No results for input(s): CKTOTAL, CKMB, CKMBINDEX, TROPONINI in the last 168 hours. BNP (last 3 results) No results for input(s): PROBNP in the last 8760 hours. HbA1C: No results for input(s): HGBA1C in the last 72 hours. CBG: No results for input(s): GLUCAP in the last 168 hours. Lipid Profile: No results for input(s): CHOL, HDL, LDLCALC, TRIG, CHOLHDL, LDLDIRECT in the last 72 hours. Thyroid Function Tests: No results for input(s): TSH, T4TOTAL, FREET4, T3FREE, THYROIDAB in the last 72 hours. Anemia Panel: No results for input(s): VITAMINB12, FOLATE, FERRITIN, TIBC, IRON, RETICCTPCT in the last 72 hours. Sepsis Labs: No results for input(s): PROCALCITON, LATICACIDVEN in the last 168 hours.  No results found for this or any previous visit (from the past 240 hour(s)).       Radiology Studies: No results found.      Scheduled Meds: . atorvastatin  20 mg Oral q morning - 10a  . carvedilol  3.125 mg Oral BID  . losartan  25 mg Oral q morning - 10a  . pantoprazole (PROTONIX) IV  40 mg Intravenous QHS  . tamsulosin  0.8 mg Oral q morning - 10a   Continuous Infusions: . sodium chloride 100 mL/hr at 12/23/16 0605     LOS: 1 day    Time spent: 35 minutes.     Elmarie Shiley, MD [facility] Pager [phone]  If 7PM-7AM, please contact night-coverage [email or web address] Password [facility] 12/23/2016, 9:13 AM

## 2016-12-23 NOTE — Plan of Care (Signed)
Problem: Education: Goal: Knowledge of [facility] Education information/materials will improve Outcome: Completed/Met Date Met: 12/23/16 Patient spouse verbalized understanding r/t continued plan of care

## 2016-12-23 NOTE — Consult Note (Signed)
Referring Provider: Dr. Tyrell Antonio, [facility] Primary Care Physician:  [name], MD Primary Gastroenterologist:  Dr. Fuller Plan  Reason for Consultation:  GI bleed  HPI: Paul Fry is a 71 y.o. male with medical history significant of BPH, GERD, hiatal hernia, history of adenomatous polyps, hyperlipidemia, hypertension, nonischemic cardiomyopathy, and type 2 diabetes.  Has history of diverticular bleed in 2010 and 2011.  Presented to the ED with complaints of black stools over the weekend and then passing red blood on one occasion prior to coming to the ED.  ED Course: Initial vital signs in the emergency department the patient 98.64F, pulse 57, blood pressure is 135/88 mmHg, respirations 18 O2 sat 97% on room air. Physical exam done by Dr. Billy Fischer showed gross blood clots on rectal examination. He was given Protonix 40 mg IVPB 1 dose. PT and INR normal. WBC is 7.1, hemoglobin 11.4 g/dL (decreased from baseline is around 13.5-14.0) and platelets 299. His CMP shows a glucose of 117 and calcium of 8.7 mg/dL, but he was otherwise normal.  This morning Hgb is stable at 11.8 grams.  He says that prior to last weekend he was having normal BM's, moves his bowels regularly.  The black stools that he describes were mixed with brown stool as well.  Denies nausea, vomiting, abdominal pain, dysphagia.  Appetite is good.  Denies NSAID use.  Denies CP, SOB, dizziness, weakness.  Does take omeprazole 20 mg BID.  Says that it helps for the most part, but he still does get occasional heartburn/reflux.  His last colonoscopy was in August 2014 at which time a sessile polyp was removed. Pathology revealed a tubular adenoma and he was advised to have surveillance in 5 years.   Last EGD 11/2014 showed the following:  1. 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 Mild gastritis in the gastric fundus and gastric body; multiple biopsies performed 3. Small hiatal hernia  Gastric biopsies showed chronic  active gastritis positive for Hpylori--treated.  Past Medical History:  Diagnosis Date  . BPH (benign prostatic hyperplasia)   . Diverticulosis of colon   . Elevated PSA    1.22 on 01-20-2016  . Feeling of incomplete bladder emptying   . GERD (gastroesophageal reflux disease)   . Hiatal hernia   . History of adenomatous polyp of colon    2014 tubular adenoma  . History of chronic gastritis   . History of Helicobacter pylori infection    07/ 2016  . History of lower GI bleeding    07/ 2010 and 12/ 2011  diverticular bleed both times  . Hyperlipidemia   . Hypertension   . Lower urinary tract symptoms (LUTS)   . Nonischemic cardiomyopathy ([place])   . Plantar fascia syndrome   . Strains to urinate    INTERMITTANT  . Type 2 diabetes, diet controlled ([place])   . Wears glasses     Past Surgical History:  Procedure Laterality Date  . APPENDECTOMY  teen  . CARDIAC CATHETERIZATION  09-10-2008   dr dalton mclean   mild non-obstructive CAD;  30-40% mRCA, 20%pLAD,  ef 55%  . CARDIOVASCULAR STRESS TEST  07/29/2011   Low risk nuclear study w/ no ischemia or infarct/  LVEF 47% and  apical hypokinesis (08-07-2012 cardiac MRI -- EF 49% w/ no hyperenhancement distal septal and apical hypokinesis, mild LAE, mild RVE, mild AR with mild dilated sinus 76mm)  . CATARACT EXTRACTION W/ INTRAOCULAR LENS  IMPLANT, BILATERAL  2016  . COLONOSCOPY  last one 12-21-2012  .  CYSTOSCOPY WITH INSERTION OF UROLIFT N/A 06/17/2016   Procedure: CYSTOSCOPY WITH INSERTION OF UROLIFT;  Surgeon: [name], MD;  Location: [place];  Service: Urology;  Laterality: N/A;  . ESOPHAGOGASTRODUODENOSCOPY  last one 11-22-2014  . TRANSTHORACIC ECHOCARDIOGRAM  09/17/2014   ef 50-55%/  trivial AR, MR and PR/  mild LAE and RAE/  mild TR    Prior to Admission medications   Medication Sig Start Date End Date Taking? Authorizing Provider  ascorbic acid (VITAMIN C) 1000 MG tablet Take 1,000 mg by mouth daily.    Yes [provider]  atorvastatin (LIPITOR) 20 MG tablet Take 1 tablet (20 mg total) by mouth every morning. 07/27/16  Yes [name], MD  calcium carbonate (TUMS) 500 MG chewable tablet Chew 1 tablet by mouth as needed for indigestion. Reported on 07/17/2015   Yes [provider]  carvedilol (COREG) 3.125 MG tablet Take 1 tablet (3.125 mg total) by mouth 2 (two) times daily. 07/27/16  Yes [name], MD  cetirizine (ZYRTEC) 10 MG tablet Take 10 mg by mouth daily as needed for allergies.    Yes [provider]  fish oil-omega-3 fatty acids 1000 MG capsule Take 1 g by mouth daily.   Yes [provider]  losartan (COZAAR) 25 MG tablet Take 1 tablet (25 mg total) by mouth every morning. 07/27/16  Yes [name], MD  Multiple Vitamin (MULTIVITAMIN) tablet Take 1 tablet by mouth daily.   Yes [provider]  omeprazole (PRILOSEC) 20 MG capsule Take 1 capsule (20 mg total) by mouth 2 (two) times daily. 07/27/16 09/10/17 Yes [name], MD  tamsulosin (FLOMAX) 0.4 MG CAPS capsule Take 2 capsules (0.8 mg total) by mouth every morning. 07/27/16  Yes [name], MD    Current Facility-Administered Medications  Medication Dose Route Frequency Provider Last Rate Last Dose  . 0.9 %  sodium chloride infusion   Intravenous Continuous Reubin Milan, MD 100 mL/hr at 12/23/16 [phone]    . atorvastatin (LIPITOR) tablet 20 mg  20 mg Oral q morning - 10a Reubin Milan, MD      . carvedilol (COREG) tablet 3.125 mg  3.125 mg Oral BID Reubin Milan, MD   3.125 mg at 12/23/16 3151  . loratadine (CLARITIN) tablet 10 mg  10 mg Oral Daily PRN Reubin Milan, MD      . losartan (COZAAR) tablet 25 mg  25 mg Oral q morning - 10a Reubin Milan, MD      . ondansetron [facility]) tablet 4 mg  4 mg Oral Q6H PRN Reubin Milan, MD       Or  . ondansetron [facility]) injection 4 mg  4 mg Intravenous Q6H PRN Reubin Milan, MD      .  pantoprazole (PROTONIX) injection 40 mg  40 mg Intravenous QHS Reubin Milan, MD   40 mg at 12/23/16 0121  . tamsulosin (FLOMAX) capsule 0.8 mg  0.8 mg Oral q morning - 10a Reubin Milan, MD        Allergies as of 12/22/2016  . (No Known Allergies)    Family History  Problem Relation Age of Onset  . Colon polyps Brother   . Colon cancer Mother 40  . Lung cancer Brother   . Heart disease Sister        MI, pace maker  . Kidney disease Sister        renal failure, nephrectomy-  not cancer  . Hypertension Sister   . Hypertension Sister   . Diabetes Neg Hx        DM  . Depression Neg Hx   . Stroke Neg Hx   . Alcohol abuse Neg Hx        also no drug abuse   . Prostate cancer Neg Hx     Social History   Social History  . Marital status: Married    Spouse name: N/A  . Number of children: 0  . Years of education: N/A   Occupational History  . truck driver- retired Retired   Social History Main Topics  . Smoking status: Never Smoker  . Smokeless tobacco: Never Used  . Alcohol use Yes     Comment: rarely  . Drug use: No  . Sexual activity: Not on file   Other Topics Concern  . Not on file   Social History Narrative   Married 2003, no children.    Retired Administrator, gets regular exercise- walking.    Step daughter is MD in [place]: ROS is O/W negative except as mentioned in HPI.  Physical Exam: Vital signs in last 24 hours: Temp:  [97.6 F (36.4 C)-98.7 F (37.1 C)] 97.6 F (36.4 C) (08/16 0503) Pulse Rate:  [55-66] 60 (08/16 0852) Resp:  [12-18] 18 (08/16 0503) BP: (123-163)/(59-88) 132/62 (08/16 0852) SpO2:  [97 %-99 %] 99 % (08/16 0503) Weight:  [204 lb 9.4 oz (92.8 kg)-205 lb (93 kg)] 204 lb 9.4 oz (92.8 kg) (08/16 0110) Last BM Date: 12/22/16 General:  Alert, Well-developed, well-nourished, pleasant and cooperative in NAD Head:  Normocephalic and atraumatic. Eyes:  Sclera clear, no icterus.  Conjunctiva pink. Ears:   Normal auditory acuity. Mouth:  No deformity or lesions.   Lungs:  Clear throughout to auscultation.  No wheezes, crackles, or rhonchi.  No increased WOB. Heart:  RRR.  No M/R/G. Abdomen:  Soft, non-distended.  BS present.  Non-tender. Rectal:  No external abnormalities noted.  DRE revealed enlarged prostate.  Small amount of dark red blood on exam glove. Msk:  Symmetrical without gross deformities. Pulses:  Normal pulses noted. Extremities:  Without clubbing or edema. Neurologic:  Alert and  oriented x4;  grossly normal neurologically. Skin:  Intact without significant lesions or rashes. Psych:  Alert and cooperative. Normal mood and affect.  Intake/Output from previous day: 08/15 0701 - 08/16 0700 In: 46.7 [I.V.:46.7] Out: -   Lab Results:  Recent Labs  12/22/16 2231 12/23/16 0459  WBC 7.1  --   HGB 11.4* 11.8*  HCT 36.0* 36.7*  PLT 299  --    BMET  Recent Labs  12/22/16 2231 12/23/16 0459  NA 138 140  K 4.0 3.9  CL 105 106  CO2 27 27  GLUCOSE 117* 106*  BUN 15 13  CREATININE 0.92 0.84  CALCIUM 8.7* 8.8*   LFT  Recent Labs  12/23/16 0459  PROT 6.7  ALBUMIN 3.5  AST 23  ALT 23  ALKPHOS 50  BILITOT 0.7   PT/INR  Recent Labs  12/22/16 2231  LABPROT 13.7  INR 1.05   IMPRESSION:  *71 year old male with history of diverticular bleed x 2 in the past and a small non-bleeding esophageal AVM on previous EGD who presents to GI bleed.  I am not quite sure by history and exam whether this is upper or lower in origin.  My thoughts are lower, likely diverticular, as he  describes the "black stools" as being some black color mixed with brown stool.  DRE today I think is more c/w lower GIB with old blood.  Hgb is stable today although overall down by about 2 grams compared to earlier this year. *Personal history of colon polyps:  Adenomatous polyps in 2014.  Due for recall 12/2017.  PLAN: *Will discuss with Dr. Fuller Plan.  I anticipate observation for now.  Monitor Hgb  and transfuse prn.  I am going to place him on a clear liquid diet.   ZEHR, JESSICA D.  12/23/2016, 9:56 AM  Pager number [phone]     Attending physician's note   I have taken a history, examined the patient and reviewed the chart. I agree with the Advanced Practitioner's note, impression and recommendations. Suspect this a LGIB from diverticulosis - normal BUN and dark red blood on DRE. Observe on clear liquids, IVF for now. If brisk bleeding occurs proceed with nuclear tagged RBC scan. If slower bleeding persists will consider endoscopic evaluation. Trend Hb. Transfuse to keep Hb > 8.   Lucio Edward, MD [facility] [phone] Mon-Fri 8a-5p [phone] after 5p, weekends, holidays

## 2016-12-24 LAB — CBC
HCT: 35.7 % — ABNORMAL LOW (ref 39.0–52.0)
HEMOGLOBIN: 11.2 g/dL — AB (ref 13.0–17.0)
MCH: 24.5 pg — ABNORMAL LOW (ref 26.0–34.0)
MCHC: 31.4 g/dL (ref 30.0–36.0)
MCV: 77.9 fL — ABNORMAL LOW (ref 78.0–100.0)
PLATELETS: 300 10*3/uL (ref 150–400)
RBC: 4.58 MIL/uL (ref 4.22–5.81)
RDW: 17 % — AB (ref 11.5–15.5)
WBC: 5.9 10*3/uL (ref 4.0–10.5)

## 2016-12-24 LAB — BASIC METABOLIC PANEL
Anion gap: 6 (ref 5–15)
BUN: 9 mg/dL (ref 6–20)
CALCIUM: 8.2 mg/dL — AB (ref 8.9–10.3)
CO2: 24 mmol/L (ref 22–32)
CREATININE: 0.82 mg/dL (ref 0.61–1.24)
Chloride: 108 mmol/L (ref 101–111)
Glucose, Bld: 96 mg/dL (ref 65–99)
Potassium: 3.7 mmol/L (ref 3.5–5.1)
SODIUM: 138 mmol/L (ref 135–145)

## 2016-12-24 NOTE — Discharge Summary (Signed)
Physician Discharge Summary  Paul Fry:[id number] DOB: 12-25-1945 DOA: 12/22/2016  PCP: Paul Ghent, MD  Admit date: 12/22/2016 Discharge date: 12/24/2016  Admitted From: Home  Disposition: Home   Recommendations for Outpatient Follow-up:  1. Follow up with PCP in 1-2 weeks 2. Please obtain BMP/CBC in one week 3. Needs follow up with Dr Paul Fry for further evaluation of GI bleed.  4. Follow cbc.     Discharge Condition: Stable.  CODE STATUS: Full code.  Diet recommendation: Heart Healthy  Brief/Interim Summary: Brief Narrative: Paul Fry a 71 y.o.malewith medical history significant of BPH, GERD, hiatal hernia, history of adenomatous polyps. History of chronic gastritis and H. pylori infection, hyperlipidemia, hypertension, nonischemic cardiomyopathy and type 2 diabetes, history of diverticular bleed in 2010 and 2011 who is coming to the emergency department with complaints of melena since Saturday and rectal bleeding since earlier today. He denies abdominal pain, nausea, emesis, diarrhea or constipation. He denies chest pain, palpitations, diaphoresis, PND, orthopnea, but complains of dizziness when getting out of bed or standing up from a chair. He mentions that he had mild lower extremity edema last month, which is the first time that this has happened, but has not had it again since then. He denies dysuria, frequency, hematuria, polyuria, polydipsia, polyphagia or blurred vision. No skin rashes or pruritus.   Assessment & Plan:   Principal Problem:   Lower GI bleed Active Problems:   GERD   Hypercholesteremia   HTN (hypertension)   Anemia due to acute blood loss   BPH (benign prostatic hyperplasia)  1-Acute blood loss anemia, GI bleed; likely divertcular bleed.  Report melena and bloody stool. Hb has been stable. He had black stool during hospitalization, that was thought to be related to old blood.  GI consulted. Patient stable for discharge  today.    2-GERD Protonix 40 mg IV PB every 24 hours.  Hypercholesteremia Continue atorvastatin 20 mg by mouth daily.   HTN (hypertension) Continue carvedilol 3.125 mg by mouth daily. Hold losartan.   BPH (benign prostatic hyperplasia) Continue Flomax.     Discharge Diagnoses:  Principal Problem:   Lower GI bleed Active Problems:   GERD   Hypercholesteremia   HTN (hypertension)   Anemia due to acute blood loss   BPH (benign prostatic hyperplasia)    Discharge Instructions  Discharge Instructions    Diet - low sodium heart healthy    Complete by:  As directed    Increase activity slowly    Complete by:  As directed      Allergies as of 12/24/2016   No Known Allergies     Medication List    STOP taking these medications   losartan 25 MG tablet Commonly known as:  COZAAR     TAKE these medications   ascorbic acid 1000 MG tablet Commonly known as:  VITAMIN C Take 1,000 mg by mouth daily.   atorvastatin 20 MG tablet Commonly known as:  LIPITOR Take 1 tablet (20 mg total) by mouth every morning.   carvedilol 3.125 MG tablet Commonly known as:  COREG Take 1 tablet (3.125 mg total) by mouth 2 (two) times daily.   cetirizine 10 MG tablet Commonly known as:  ZYRTEC Take 10 mg by mouth daily as needed for allergies.   fish oil-omega-3 fatty acids 1000 MG capsule Take 1 g by mouth daily.   multivitamin tablet Take 1 tablet by mouth daily.   omeprazole 20 MG capsule Commonly known as:  PRILOSEC  Take 1 capsule (20 mg total) by mouth 2 (two) times daily.   tamsulosin 0.4 MG Caps capsule Commonly known as:  FLOMAX Take 2 capsules (0.8 mg total) by mouth every morning.   TUMS 500 MG chewable tablet Generic drug:  calcium carbonate Chew 1 tablet by mouth as needed for indigestion. Reported on 07/17/2015       No Known Allergies  Consultations:  Dr Paul Fry.    Procedures/Studies:  No results found.    Subjective: He is  feeling better, had some black stool yesterday  Tolerating diet   Discharge Exam: Vitals:   12/24/16 0800 12/24/16 0817  BP: 135/73 135/73  Pulse: (!) 50 (!) 50  Resp:    Temp:    SpO2:     Vitals:   12/23/16 2159 12/24/16 0520 12/24/16 0800 12/24/16 0817  BP: 134/68 136/66 135/73 135/73  Pulse: (!) 54 (!) 55 (!) 50 (!) 50  Resp: 18 18    Temp: 98.2 F (36.8 C) 98.1 F (36.7 C)    TempSrc: Oral Oral    SpO2: 94% 93%    Weight:      Height:        General: Pt is alert, awake, not in acute distress Cardiovascular: RRR, S1/S2 +, no rubs, no gallops Respiratory: CTA bilaterally, no wheezing, no rhonchi Abdominal: Soft, NT, ND, bowel sounds + Extremities: no edema, no cyanosis    The results of significant diagnostics from this hospitalization (including imaging, microbiology, ancillary and laboratory) are listed below for reference.     Microbiology: No results found for this or any previous visit (from the past 240 hour(s)).   Labs: BNP (last 3 results) No results for input(s): BNP in the last 8760 hours. Basic Metabolic Panel:  Recent Labs Lab 12/22/16 2231 12/23/16 0459 12/24/16 0358  NA 138 140 138  K 4.0 3.9 3.7  CL 105 106 108  CO2 27 27 24   GLUCOSE 117* 106* 96  BUN 15 13 9   CREATININE 0.92 0.84 0.82  CALCIUM 8.7* 8.8* 8.2*   Liver Function Tests:  Recent Labs Lab 12/22/16 2231 12/23/16 0459  AST 24 23  ALT 25 23  ALKPHOS 55 50  BILITOT 0.6 0.7  PROT 6.9 6.7  ALBUMIN 3.6 3.5   No results for input(s): LIPASE, AMYLASE in the last 168 hours. No results for input(s): AMMONIA in the last 168 hours. CBC:  Recent Labs Lab 12/22/16 2231 12/23/16 0459 12/23/16 1109 12/24/16 0358  WBC 7.1  --  5.8 5.9  NEUTROABS 3.2  --  2.6  --   HGB 11.4* 11.8* 11.5* 11.2*  HCT 36.0* 36.7* 36.5* 35.7*  MCV 78.4  --  77.3* 77.9*  PLT 299  --  297 300   Cardiac Enzymes: No results for input(s): CKTOTAL, CKMB, CKMBINDEX, TROPONINI in the last 168  hours. BNP: Invalid input(s): POCBNP CBG: No results for input(s): GLUCAP in the last 168 hours. D-Dimer No results for input(s): DDIMER in the last 72 hours. Hgb A1c No results for input(s): HGBA1C in the last 72 hours. Lipid Profile No results for input(s): CHOL, HDL, LDLCALC, TRIG, CHOLHDL, LDLDIRECT in the last 72 hours. Thyroid function studies No results for input(s): TSH, T4TOTAL, T3FREE, THYROIDAB in the last 72 hours.  Invalid input(s): FREET3 Anemia work up No results for input(s): VITAMINB12, FOLATE, FERRITIN, TIBC, IRON, RETICCTPCT in the last 72 hours. Urinalysis    Component Value Date/Time   BILIRUBINUR negative 01/20/2016 0908   PROTEINUR negative 01/20/2016 0908  UROBILINOGEN 1.0 01/20/2016 0908   NITRITE negative 01/20/2016 0908   LEUKOCYTESUR Negative 01/20/2016 0908   Sepsis Labs Invalid input(s): PROCALCITONIN,  WBC,  LACTICIDVEN Microbiology No results found for this or any previous visit (from the past 240 hour(s)).   Time coordinating discharge: Over 30 minutes  SIGNED:   Elmarie Shiley, MD  [facility] 12/24/2016, 12:52 PM Pager   If 7PM-7AM, please contact night-coverage [email or web address] Password [email or web address]

## 2016-12-24 NOTE — Progress Notes (Signed)
Patient tolerated diet and no bloody stool, Dr. Tyrell Antonio inform and okay to discharge patient. Discharge instructions given and explained to patient/wife and they verbalized understanding, denies any pain/distress. Accompanied home by wife.

## 2016-12-24 NOTE — Progress Notes (Signed)
     [facility] Progress Note  Subjective:  Feels good.  Anxious to go home.  Had 3 BM's yesterday afternoon/evening with what sounds like old blood.  Hgb is stable.  Objective:  Vital signs in last 24 hours: Temp:  [97.7 F (36.5 C)-98.2 F (36.8 C)] 98.1 F (36.7 C) (08/17 0520) Pulse Rate:  [50-64] 50 (08/17 0817) Resp:  [18] 18 (08/17 0520) BP: (121-136)/(66-73) 135/73 (08/17 0817) SpO2:  [93 %-100 %] 93 % (08/17 0520) Last BM Date: 12/23/16 General:  Alert, Well-developed, in NAD Heart:  Bradycardic; no murmurs Pulm:  CTAB.  No increased WOB. Abdomen:  Soft, non-distended.  BS present.  Non-tender.  Extremities:  Without edema. Neurologic:  Alert and oriented x 4;  grossly normal neurologically. Psych:  Alert and cooperative. Normal mood and affect.  Intake/Output from previous day: 08/16 0701 - 08/17 0700 In: 2640 [P.O.:240; I.V.:2400] Out: 3 [Urine:3]  Lab Results:  Recent Labs  12/22/16 2231 12/23/16 0459 12/23/16 1109 12/24/16 0358  WBC 7.1  --  5.8 5.9  HGB 11.4* 11.8* 11.5* 11.2*  HCT 36.0* 36.7* 36.5* 35.7*  PLT 299  --  297 300   BMET  Recent Labs  12/22/16 2231 12/23/16 0459 12/24/16 0358  NA 138 140 138  K 4.0 3.9 3.7  CL 105 106 108  CO2 27 27 24   GLUCOSE 117* 106* 96  BUN 15 13 9   CREATININE 0.92 0.84 0.82  CALCIUM 8.7* 8.8* 8.2*   LFT  Recent Labs  12/23/16 0459  PROT 6.7  ALBUMIN 3.5  AST 23  ALT 23  ALKPHOS 50  BILITOT 0.7   PT/INR  Recent Labs  12/22/16 2231  LABPROT 13.7  INR 1.05    Assessment / Plan: *71 year old male with history of diverticular bleed x 2 in the past and a 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  Suspect lower GI from diverticulosis.  Hgb is stable today.  Had 3 stools with what sounds like old blood yesterday afternoon/evening. *Personal history of colon polyps:  Adenomatous polyps in 2014.  Due for recall 12/2017.  -Will advance to soft  diet and if he does not have any sign of active bleeding then he can probably be discharged with afternoon.  Should follow-up with PCP for repeat CBC next week.   LOS: 2 days   ZEHR, JESSICA D.  12/24/2016, 9:22 AM  Pager number [phone]     Attending physician's note   I have taken an interval history, reviewed the chart and examined the patient. I agree with the Advanced Practitioner's note, impression and recommendations.  Resolving LGI bleed likely from diverticulosis. Mild anemia. Hb stable. No further GI evaluation is recommended at this time. Advance to a soft diet and if no signs of recurrent bleeding this afternoon he would be OK for discharge later today from GI standpoint.  Advised no strenuous activity for 1 week.  Advised no ASA/NSAIDs for 1 week.  GI follow up for colonoscopy in 12/2017 for polyp surveillance and prn.  Follow up with Elsie Stain, MD his PCP in about 2 weeks.  GI signing off.  Lucio Edward, MD [facility] [phone] Mon-Fri 8a-5p [phone] after 5p, weekends, holidays

## 2016-12-27 ENCOUNTER — Telehealth: Payer: Self-pay

## 2016-12-27 NOTE — Telephone Encounter (Signed)
Transition Care Management Follow-up Telephone Call   Date discharged? 12/24/16   How have you been since you were released from the hospital? "I'm good"   Do you understand why you were in the hospital? yes   Do you understand the discharge instructions? yes   Where were you discharged to? Home.    Items Reviewed:  Medications reviewed: yes  Allergies reviewed: yes  Dietary changes reviewed: yes, no changes.   Referrals reviewed: yes. Instructed to f/u with GI with recurring issues.    Functional Questionnaire:   Activities of Daily Living (ADLs):   He states they are independent in the following: ambulation, bathing and hygiene, feeding, continence, grooming, toileting and dressing States they require assistance with the following: None   Any transportation issues/concerns?: no   Any patient concerns? no   Confirmed importance and date/time of follow-up visits scheduled yes  Provider Appointment booked with PCP 12/31/16 @ 3:15pm  Confirmed with patient if condition begins to worsen call PCP or go to the ER.  Patient was given the office number and encouraged to call back with question or concerns.  : yes

## 2016-12-31 ENCOUNTER — Ambulatory Visit (INDEPENDENT_AMBULATORY_CARE_PROVIDER_SITE_OTHER): Payer: Medicare Other | Admitting: Family Medicine

## 2016-12-31 ENCOUNTER — Encounter: Payer: Self-pay | Admitting: Family Medicine

## 2016-12-31 VITALS — BP 118/60 | HR 72 | Temp 97.8°F | Wt 211.5 lb

## 2016-12-31 DIAGNOSIS — K922 Gastrointestinal hemorrhage, unspecified: Secondary | ICD-10-CM

## 2016-12-31 DIAGNOSIS — K921 Melena: Secondary | ICD-10-CM

## 2016-12-31 LAB — CBC WITH DIFFERENTIAL/PLATELET
BASOS PCT: 1 %
Basophils Absolute: 69 cells/uL (ref 0–200)
EOS ABS: 69 {cells}/uL (ref 15–500)
Eosinophils Relative: 1 %
HCT: 37.5 % — ABNORMAL LOW (ref 38.5–50.0)
Hemoglobin: 11.7 g/dL — ABNORMAL LOW (ref 13.2–17.1)
LYMPHS PCT: 33 %
Lymphs Abs: 2277 cells/uL (ref 850–3900)
MCH: 24.3 pg — ABNORMAL LOW (ref 27.0–33.0)
MCHC: 31.2 g/dL — ABNORMAL LOW (ref 32.0–36.0)
MCV: 77.8 fL — AB (ref 80.0–100.0)
MONO ABS: 414 {cells}/uL (ref 200–950)
MONOS PCT: 6 %
MPV: 9.6 fL (ref 7.5–12.5)
NEUTROS ABS: 4071 {cells}/uL (ref 1500–7800)
Neutrophils Relative %: 59 %
PLATELETS: 328 10*3/uL (ref 140–400)
RBC: 4.82 MIL/uL (ref 4.20–5.80)
RDW: 17.3 % — ABNORMAL HIGH (ref 11.0–15.0)
WBC: 6.9 10*3/uL (ref 3.8–10.8)

## 2016-12-31 NOTE — Progress Notes (Signed)
PCP: [name], MD  Admit date: 12/22/2016 Discharge date: 12/24/2016  Admitted From: Home  Disposition: Home   Recommendations for Outpatient Follow-up:  1. Follow up with PCP in 1-2 weeks 2. Please obtain BMP/CBC in one week 3. Needs follow up with Dr Forrestine Him for further evaluation of GI bleed.  4. Follow cbc.    Discharge Condition: Stable.  CODE STATUS: Full code.  Diet recommendation: Heart Healthy  Brief/Interim Summary: Brief Narrative:Paul L Myrickis a 71 y.o.malewith medical history significant of BPH, GERD, hiatal hernia, history of adenomatous polyps. History of chronic gastritis and H. pylori infection, hyperlipidemia, hypertension, nonischemic cardiomyopathy and type 2 diabetes, history of diverticular bleed in 2010 and 2011 who is coming to the emergency department with complaints of melena since Saturday and rectal bleeding since earlier today. He denies abdominal pain, nausea, emesis, diarrhea or constipation. He denies chest pain, palpitations, diaphoresis, PND, orthopnea, but complains of dizziness when getting out of bed or standing up from a chair. He mentions that he had mild lower extremity edema last month, which is the first time that this has happened, but has not had it again since then. He denies dysuria, frequency, hematuria, polyuria, polydipsia, polyphagia or blurred vision. No skin rashes or pruritus.   Assessment & Plan:  Principal Problem: Lower GI bleed Active Problems: GERD Hypercholesteremia HTN (hypertension) Anemia due to acute blood loss BPH (benign prostatic hyperplasia)  1-Acute blood loss anemia, GI bleed; likely divertcular bleed.  Report melena and bloody stool. Hb has been stable. He had black stool during hospitalization, that was thought to be related to old blood.  GI consulted. Patient stable for discharge today.    2-GERD Protonix 40 mg IV PB every 24  hours.  Hypercholesteremia Continue atorvastatin 20 mg by mouth daily.   HTN (hypertension) Continue carvedilol 3.125 mg by mouth daily. Hold losartan.   BPH (benign prostatic hyperplasia) Continue Flomax.    Discharge Diagnoses:  Principal Problem:   Lower GI bleed Active Problems:   GERD   Hypercholesteremia   HTN (hypertension)   Anemia due to acute blood loss   BPH (benign prostatic hyperplasia)   =========================================== Inpatient follow-up. Admitted with gi bleed. Black stools previously noted. GI consult as inpatient. Treated with PPI. Did not require endoscopy as inpatient. Losartan help. Stable for discharge.  In the meantime he's had some occasional upper abdominal pain with eating. He is swallowing well. No vomiting, no diarrhea. No more black stools. No red blood in stools. He is getting ready to leave the country in about a week, to go to [place] He will be there for about 1 week. He has not yet seen GI as an outpatient. He is not lightheaded. He is occasionally somewhat tired. He feels better than upon presentation to the hospital.  [place] [facility] [place] reviewed  ROS: Per HPI unless specifically indicated in ROS section   Meds, vitals, and allergies reviewed.   GEN: nad, alert and oriented HEENT: mucous membranes moist NECK: supple w/o LA CV: rrr. PULM: ctab, no inc wob ABD: soft, +bs EXT: no edema SKIN: no acute rash

## 2016-12-31 NOTE — Patient Instructions (Signed)
Go to the lab on the way out.  We'll contact you with your lab report. Don't restart the losartan as long as you are lightheaded.   If your BP is <140/<90, then you don't need to restart it yet anyway.  Paul Fry will call about your referral. Keep taking omeprazole 20mg  twice a day.  If you have any black stools in the meantime, then let us know.   No aspirin.   Take care.  Glad to see you.

## 2017-01-01 LAB — BASIC METABOLIC PANEL
BUN: 15 mg/dL (ref 7–25)
CALCIUM: 9 mg/dL (ref 8.6–10.3)
CO2: 24 mmol/L (ref 20–32)
Chloride: 105 mmol/L (ref 98–110)
Creat: 1 mg/dL (ref 0.70–1.18)
Glucose, Bld: 141 mg/dL — ABNORMAL HIGH (ref 65–99)
POTASSIUM: 4.5 mmol/L (ref 3.5–5.3)
Sodium: 139 mmol/L (ref 135–146)

## 2017-01-01 LAB — IRON: IRON: 34 ug/dL — AB (ref 50–180)

## 2017-01-02 MED ORDER — IRON 325 (65 FE) MG PO TABS: 325.0000 mg | ORAL_TABLET | Freq: Every day | ORAL | Status: DC

## 2017-01-02 NOTE — Assessment & Plan Note (Addendum)
No more black stools. No red blood in stools. Still with occasional upper abdominal pain with eating. Doing well otherwise. Recheck labs today. Not yet on iron. Discussed with patient that taking oral iron can make his stools turned black. Discussed with patient about GI bleed in general and pathophysiology. He understood. Okay for outpatient follow-up. Refer to GI. Continue PPI.  Wouldn't restart the losartan if he is lightheaded.   Since BP is <140/<90, he doesn't need to restart it yet anyway.  D/w pt.

## 2017-01-17 ENCOUNTER — Ambulatory Visit (INDEPENDENT_AMBULATORY_CARE_PROVIDER_SITE_OTHER): Payer: Medicare Other | Admitting: Physician Assistant

## 2017-01-17 ENCOUNTER — Encounter: Payer: Self-pay | Admitting: Physician Assistant

## 2017-01-17 VITALS — BP 106/68 | HR 82 | Ht 71.5 in | Wt 213.4 lb

## 2017-01-17 DIAGNOSIS — Z1211 Encounter for screening for malignant neoplasm of colon: Secondary | ICD-10-CM

## 2017-01-17 DIAGNOSIS — D508 Other iron deficiency anemias: Secondary | ICD-10-CM | POA: Diagnosis not present

## 2017-01-17 DIAGNOSIS — R1013 Epigastric pain: Secondary | ICD-10-CM

## 2017-01-17 DIAGNOSIS — Z8719 Personal history of other diseases of the digestive system: Secondary | ICD-10-CM

## 2017-01-17 DIAGNOSIS — Z86010 Personal history of colonic polyps: Secondary | ICD-10-CM

## 2017-01-17 MED ORDER — NA SULFATE-K SULFATE-MG SULF 17.5-3.13-1.6 GM/177ML PO SOLN: 1.0000 | Freq: Once | ORAL | 0 refills | Status: AC

## 2017-01-17 NOTE — Progress Notes (Signed)
 Subjective:    Patient ID: Paul Fry, male    DOB: 11/22/1945, 71 y.o.   MRN: [id number]  HPI Donavyn is a pleasant 71-year-old African-American male, known to Dr. Stark who is referred back today per Dr. Graham Duncan for evaluation of recent complaints of melena, and iron deficiency anemia. Patient has history of GERD, adenomatous colon polyps, hypertension, history of diverticular bleeding in 2010 and 2011, nonischemic cardiomyopathy and adult-onset diabetes mellitus. Last EGD was done in July 2016 with finding of a 3 mm AVM in the esophagus, otherwise negative exam and a colonoscopy in August 2014 he had a 6 mm polyp in the transverse colon and was noted to have moderate diverticulosis area The Polyp Consistent with a Tubular Adenoma. Patient Was Hospitalized August 15 through 12/24/2016 with What Was Felt to Be Lower GI Bleeding. Patient Complained of Very Dark Stools but on Careful Questioning He Says This Was Maroonish-looking. He Had 1 Episode of Grossly Bloody Stool Prior to Presenting to the Hospital and Then Did Not Have Any Further Active Bleeding That He Was Aware of. Hemoglobin Was 11.4, and he did not require transfusion. He was seen by GI, and bleeding was felt most likely to be diverticular. Over the past several weeks he says he has not had any recurrence of melena and has been having 2-3 normal bowel movements per day. He says he has been placed on oral iron and stools are a bit darker but not melenic and no gross blood. He is complaining of some epigastric pain after eating. He says food seems to get to a certain spots and then he gets very uncomfortable with eating. This gradually goes away after eating. This is been occurring for several weeks. Appetite has been okay has not had any nausea or vomiting. He is on Prilosec 20 mg twice daily chronically. Most recent labs on 12/31/2016 hemoglobin 11.7 and MCV of 77 and serum iron low at 34. Family history is positive for colon  cancer in her is mother diagnosed in her 70s  Review of Systems Pertinent positive and negative review of systems were noted in the above HPI section.  All other review of systems was otherwise negative.  Outpatient Encounter Prescriptions as of 01/17/2017  Medication Sig  . ascorbic acid (VITAMIN C) 1000 MG tablet Take 1,000 mg by mouth daily.  . atorvastatin (LIPITOR) 20 MG tablet Take 1 tablet (20 mg total) by mouth every morning.  . calcium carbonate (TUMS) 500 MG chewable tablet Chew 1 tablet by mouth as needed for indigestion. Reported on 07/17/2015  . carvedilol (COREG) 3.125 MG tablet Take 1 tablet (3.125 mg total) by mouth 2 (two) times daily.  . cetirizine (ZYRTEC) 10 MG tablet Take 10 mg by mouth daily as needed for allergies.   . Ferrous Sulfate (IRON) 325 (65 Fe) MG TABS Take 1 tablet (325 mg total) by mouth daily.  . fish oil-omega-3 fatty acids 1000 MG capsule Take 1 g by mouth daily.  . Multiple Vitamin (MULTIVITAMIN) tablet Take 1 tablet by mouth daily.  . omeprazole (PRILOSEC) 20 MG capsule Take 1 capsule (20 mg total) by mouth 2 (two) times daily.  . tamsulosin (FLOMAX) 0.4 MG CAPS capsule Take 2 capsules (0.8 mg total) by mouth every morning.  . Na Sulfate-K Sulfate-Mg Sulf 17.5-3.13-1.6 GM/180ML SOLN Take 1 kit by mouth once.   No facility-administered encounter medications on file as of 01/17/2017.    Allergies  Allergen Reactions  . Aspirin Other (See Comments)      History of GI bleed   Patient Active Problem List   Diagnosis Date Noted  . BPH (benign prostatic hyperplasia) 12/23/2016  . Lower GI bleed 12/22/2016  . Anemia due to acute blood loss 12/22/2016  . Wrist pain, acute, right 07/28/2016  . Lower urinary tract symptoms (LUTS) 11/19/2014  . Cough 10/23/2014  . Advance care planning 07/16/2014  . Hyperglycemia 07/16/2014  . HTN (hypertension) 07/09/2013  . Paresthesia 12/25/2012  . Cardiomyopathy ([place]) 09/26/2012  . ED (erectile dysfunction) 05/26/2012    . DOE (dyspnea on exertion) 06/18/2011  . Plantar fascia syndrome 05/26/2011  . Medicare annual wellness visit, subsequent 05/26/2011  . Hypercholesteremia 03/03/2011  . CAD, UNSPECIFIED SITE 10/14/2008  . GERD 10/14/2008  . DIVERTICULOSIS OF COLON 04/03/2008   Social History   Social History  . Marital status: Married    Spouse name: N/A  . Number of children: 0  . Years of education: N/A   Occupational History  . truck driver- retired Retired   Social History Main Topics  . Smoking status: Never Smoker  . Smokeless tobacco: Never Used  . Alcohol use Yes     Comment: rarely  . Drug use: No  . Sexual activity: Not on file   Other Topics Concern  . Not on file   Social History Narrative   Married 2003, no children.    Retired Administrator, gets regular exercise- walking.    Step daughter is MD in [place]    Mr. Neuman's family history includes Colon cancer (age of onset: 68) in his mother; Colon polyps in his brother; Heart disease in his sister; Hypertension in his sister and sister; Kidney disease in his sister; Lung cancer in his brother.      Objective:    Vitals:   01/17/17 0844  BP: 106/68  Pulse: 82    Physical Exam  well-developed older African-American male in no acute distress, accompanied by his wife, blood pressure 106/68 pulse 82, BMI 29.3. HEENT; nontraumatic normocephalic EOMIsclerae anicteric, Cardiovascular; regular rate and rhythm with S1-S2, Pulmonary; clear bilaterally, Abdomen ;soft, bowel sounds are present he is tender in the epigastrium there is no palpable mass or hepatosplenomegaly, Rectal; exam not done, Extremities ;no clubbing cyanosis or edema skin warm and dry, Neuropsych; mood and affect appropriate       Assessment & Plan:   #86 71 year old African-American male with new iron deficiency anemia, and recent hospitalization with maroon/melenic stool felt mostly likely secondary to diverticular bleeding. With new iron deficiency  anemia, recent complaints of postprandial epigastric pain, and prior history of adenomatous colon polyps, further endoscopic evaluation is indicated at this time to rule out occult upper versus lower GI neoplasm, or AVMs. #2 previous history of diverticular bleed 2010 and 2011 #3 history of adenomatous colon polyps last colonoscopy June 2014 #4 chronic GERD #5 history of esophageal AVM #6 family history of colon cancer in the patient's mother #7 adult-onset diabetes mellitus #8 nonischemic cardiomyopathy   #9 hypertension  Plan; CBC will be repeated later this week. Patient will continue oral iron supplementation Continue omeprazole 20 mg by mouth twice a day Patient will be scheduled for colonoscopy and EGD with Dr. Fuller Plan. Both procedures discussed in detail with patient and his wife, including indications and risks and benefits and they are agreeable to proceed.  Amy S Esterwood PA-C 01/17/2017   Cc: [name], MD

## 2017-01-17 NOTE — Progress Notes (Signed)
Reviewed and agree with management plan.  Mollee Neer T. Landynn Dupler, MD FACG 

## 2017-01-17 NOTE — Patient Instructions (Addendum)
  Continue Prilosec 20 mg twice daily. You have been scheduled for a colonoscopy and endoscopy. Please follow written instructions given to you at your visit today.  Please pick up your prep supplies at the pharmacy within the next 1-3 days.[place]. If you use inhalers (even only as needed), please bring them with you on the day of your procedure. Your physician has requested that you go to www.startemmi.com and enter the access code given to you at your visit today. This web site gives a general overview about your procedure. However, you should still follow specific instructions given to you by our office regarding your preparation for the procedure.   If you are age 60 or older, your body mass index should be between 23-30. Your Body mass index is 29.35 kg/m. If this is out of the aforementioned range listed, please consider follow up with your Primary Care Provider.

## 2017-01-19 ENCOUNTER — Other Ambulatory Visit (INDEPENDENT_AMBULATORY_CARE_PROVIDER_SITE_OTHER): Payer: Medicare Other

## 2017-01-19 DIAGNOSIS — K921 Melena: Secondary | ICD-10-CM

## 2017-01-19 LAB — CBC WITH DIFFERENTIAL/PLATELET
Basophils Absolute: 0.1 10*3/uL (ref 0.0–0.1)
Basophils Relative: 1 % (ref 0.0–3.0)
EOS PCT: 2.9 % (ref 0.0–5.0)
Eosinophils Absolute: 0.2 10*3/uL (ref 0.0–0.7)
HEMATOCRIT: 40.5 % (ref 39.0–52.0)
HEMOGLOBIN: 12.7 g/dL — AB (ref 13.0–17.0)
LYMPHS PCT: 33.7 % (ref 12.0–46.0)
Lymphs Abs: 2.1 10*3/uL (ref 0.7–4.0)
MCHC: 31.5 g/dL (ref 30.0–36.0)
MCV: 80 fl (ref 78.0–100.0)
MONO ABS: 0.5 10*3/uL (ref 0.1–1.0)
MONOS PCT: 7.5 % (ref 3.0–12.0)
Neutro Abs: 3.4 10*3/uL (ref 1.4–7.7)
Neutrophils Relative %: 54.9 % (ref 43.0–77.0)
Platelets: 311 10*3/uL (ref 150.0–400.0)
RBC: 5.06 Mil/uL (ref 4.22–5.81)
RDW: 19.5 % — ABNORMAL HIGH (ref 11.5–15.5)
WBC: 6.3 10*3/uL (ref 4.0–10.5)

## 2017-01-20 ENCOUNTER — Other Ambulatory Visit: Payer: Self-pay | Admitting: Family Medicine

## 2017-01-20 DIAGNOSIS — D649 Anemia, unspecified: Secondary | ICD-10-CM

## 2017-01-31 ENCOUNTER — Encounter: Payer: Self-pay | Admitting: Family Medicine

## 2017-01-31 ENCOUNTER — Ambulatory Visit (INDEPENDENT_AMBULATORY_CARE_PROVIDER_SITE_OTHER): Payer: Medicare Other | Admitting: Nurse Practitioner

## 2017-01-31 ENCOUNTER — Ambulatory Visit (INDEPENDENT_AMBULATORY_CARE_PROVIDER_SITE_OTHER): Payer: Medicare Other | Admitting: Family Medicine

## 2017-01-31 ENCOUNTER — Encounter: Payer: Self-pay | Admitting: Nurse Practitioner

## 2017-01-31 VITALS — BP 150/70 | HR 67 | Ht 72.0 in | Wt 213.8 lb

## 2017-01-31 VITALS — BP 136/60 | HR 67 | Temp 97.7°F | Ht 72.0 in | Wt 213.0 lb

## 2017-01-31 DIAGNOSIS — Z23 Encounter for immunization: Secondary | ICD-10-CM | POA: Diagnosis not present

## 2017-01-31 DIAGNOSIS — I259 Chronic ischemic heart disease, unspecified: Secondary | ICD-10-CM

## 2017-01-31 DIAGNOSIS — I428 Other cardiomyopathies: Secondary | ICD-10-CM

## 2017-01-31 DIAGNOSIS — H6980 Other specified disorders of Eustachian tube, unspecified ear: Secondary | ICD-10-CM

## 2017-01-31 MED ORDER — LOSARTAN POTASSIUM 25 MG PO TABS: 25.0000 mg | ORAL_TABLET | Freq: Every day | ORAL | 3 refills | Status: DC

## 2017-01-31 MED ORDER — FLUTICASONE PROPIONATE 50 MCG/ACT NA SUSP: 2.0000 | Freq: Every day | NASAL | 2 refills | Status: DC

## 2017-01-31 NOTE — Progress Notes (Signed)
CARDIOLOGY OFFICE NOTE  Date:  01/31/2017    Paul Fry Date of Birth: 1946/03/26 Medical Record #[id number]  PCP:  [name], MD  Cardiologist:  [name]    Chief Complaint  Patient presents with  . Congestive Heart Failure    1 year check - seen for Dr. [name]    History of Present Illness: Paul Fry is a 71 y.o. male who presents today for a one year check. Seen for Dr. [name].   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  He had a cath in 5/10 with mild nonobstructive CAD.  He had atypical chest pain in 3/13 and ETT-myoview was done, showing EF 47%, no ischemia or infarction.  Given the abnormal EF, he had an echo done which showed EF 50-55% with mild LVH.  Cardiac MR was done in 4/14, showing EF 49% with distal septal and apical hypokinesis, no delayed enhancement. Most recent echo in 2016 again showed EF 50-55%.   Last seen here a year ago by Dr. [name] and was doing well. He is not on ASA due to prior GI bleeding on aspirin.   Comes in today. Here with his wife. He says he is doing ok - "just getting old". No chest pain. Not short of breath. No swelling. Does not sound like he gets too much salt. Says his Losartan was stopped due to "bleeding" - looks moreso like BP was somewhat on the lower side and it was stopped. BP now running higher. He is seeing PCP later today due to ringing in his ears. No more bleeding and has plans for scope in the next few weeks. Remains off of aspirin. Overall, he feels like he is doing ok. He does not exercise regularly but tries to stay active.   PMH: 1. GERD 2. Plantar fasciitis 3. Hyperlipidemia 4. Diverticulosis with diverticular bleeding x 2.  Stopped ASA. 5. Left heart cath 5/10: EF 55%, 30-40% mid RCA.  6. Cardiomyopathy: Mild.  ETT-Myoview (3/13) with EF 47%, no ischemia or infarction.  Echo (3/13) with EF 50-55%, mild LVH, mild MR, mild AI.  Cardiac MRI (4/14) with EF 49%, possible  noncompaction, apical septal and apical hypokinesis, mild AI, no delayed enhancement.   - Echo (2016) with EF 50-55%, mild biatrial enlargement.  7. Type II diabetes  Past Medical History:  Diagnosis Date  . BPH (benign prostatic hyperplasia)   . Diverticulosis of colon   . Elevated PSA    1.22 on 01-20-2016  . Feeling of incomplete bladder emptying   . GERD (gastroesophageal reflux disease)   . Hiatal hernia   . History of adenomatous polyp of colon    2014 tubular adenoma  . History of chronic gastritis   . History of Helicobacter pylori infection    07/ 2016  . History of lower GI bleeding    07/ 2010 and 12/ 2011  diverticular bleed both times  . Hyperlipidemia   . Hypertension   . Lower urinary tract symptoms (LUTS)   . Nonischemic cardiomyopathy ([place])   . Plantar fascia syndrome   . Strains to urinate    INTERMITTANT  . Type 2 diabetes, diet controlled ([place])   . Wears glasses     Past Surgical History:  Procedure Laterality Date  . APPENDECTOMY  teen  . CARDIAC CATHETERIZATION  09-10-2008   dr dalton mclean   mild non-obstructive CAD;  30-40% mRCA, 20%pLAD,  ef 55%  . CARDIOVASCULAR STRESS TEST  07/29/2011   Low risk nuclear study w/ no ischemia or infarct/  LVEF 47% and  apical hypokinesis (08-07-2012 cardiac MRI -- EF 49% w/ no hyperenhancement distal septal and apical hypokinesis, mild LAE, mild RVE, mild AR with mild dilated sinus 68mm)  . CATARACT EXTRACTION W/ INTRAOCULAR LENS  IMPLANT, BILATERAL  2016  . COLONOSCOPY  last one 12-21-2012  . CYSTOSCOPY WITH INSERTION OF UROLIFT N/A 06/17/2016   Procedure: CYSTOSCOPY WITH INSERTION OF UROLIFT;  Surgeon: [name], MD;  Location: [facility];  Service: Urology;  Laterality: N/A;  . ESOPHAGOGASTRODUODENOSCOPY  last one 11-22-2014  . TRANSTHORACIC ECHOCARDIOGRAM  09/17/2014   ef 50-55%/  trivial AR, MR and PR/  mild LAE and RAE/  mild TR     Medications: Current Meds  Medication Sig  .  ascorbic acid (VITAMIN C) 1000 MG tablet Take 1,000 mg by mouth daily.  [name] atorvastatin (LIPITOR) 20 MG tablet Take 1 tablet (20 mg total) by mouth every morning.  . calcium carbonate (TUMS) 500 MG chewable tablet Chew 1 tablet by mouth as needed for indigestion. Reported on 07/17/2015  . carvedilol (COREG) 3.125 MG tablet Take 1 tablet (3.125 mg total) by mouth 2 (two) times daily.  . cetirizine (ZYRTEC) 10 MG tablet Take 10 mg by mouth daily as needed for allergies.   . Ferrous Sulfate (IRON) 325 (65 Fe) MG TABS Take 1 tablet (325 mg total) by mouth daily.  . fish oil-omega-3 fatty acids 1000 MG capsule Take 1 g by mouth daily.  . Multiple Vitamin (MULTIVITAMIN) tablet Take 1 tablet by mouth daily.  [name] omeprazole (PRILOSEC) 20 MG capsule Take 1 capsule (20 mg total) by mouth 2 (two) times daily.  . tamsulosin (FLOMAX) 0.4 MG CAPS capsule Take 2 capsules (0.8 mg total) by mouth every morning.     Allergies: Allergies  Allergen Reactions  . Aspirin Other (See Comments)    History of GI bleed    Social History: The patient  reports that he has never smoked. He has never used smokeless tobacco. He reports that he drinks alcohol. He reports that he does not use drugs.   Family History: The patient's family history includes Colon cancer (age of onset: 29) in his mother; Colon polyps in his brother; Heart disease in his sister; Hypertension in his sister and sister; Kidney disease in his sister; Lung cancer in his brother.   Review of Systems: Please see the history of present illness.   Otherwise, the review of systems is positive for none.   All other systems are reviewed and negative.   Physical Exam: VS:  BP (!) 150/70 (BP Location: Left Arm, Patient Position: Sitting, Cuff Size: Normal)   Pulse 67   Ht 6' (1.829 m)   Wt 213 lb 12.8 oz (97 kg)   SpO2 94% Comment: at rest  BMI 29.00 kg/m  .  BMI Body mass index is 29 kg/m.  Wt Readings from Last 3 Encounters:  01/31/17 213 lb 12.8  oz (97 kg)  01/17/17 213 lb 6 oz (96.8 kg)  12/31/16 211 lb 8 oz (95.9 kg)   BP recheck by me is 140/90  General: Pleasant. Well developed, well nourished and in no acute distress.   HEENT: Normal.  Neck: Supple, no JVD, carotid bruits, or masses noted.  Cardiac: Regular rate and rhythm. No murmurs, rubs, or gallops. No edema.  Respiratory:  Lungs are clear to auscultation bilaterally with normal work of breathing.  GI: Soft and  nontender.  MS: No deformity or atrophy. Gait and ROM intact.  Skin: Warm and dry. Color is normal.  Neuro:  Strength and sensation are intact and no gross focal deficits noted.  Psych: Alert, appropriate and with normal affect.   LABORATORY DATA:  EKG:  EKG is not ordered today.  Lab Results  Component Value Date   WBC 6.3 01/19/2017   HGB 12.7 (L) 01/19/2017   HCT 40.5 01/19/2017   PLT 311.0 01/19/2017   GLUCOSE 141 (H) 12/31/2016   CHOL 149 07/19/2016   TRIG 99.0 07/19/2016   HDL 33.20 (L) 07/19/2016   LDLCALC 96 07/19/2016   ALT 23 12/23/2016   AST 23 12/23/2016   NA 139 12/31/2016   K 4.5 12/31/2016   CL 105 12/31/2016   CREATININE 1.00 12/31/2016   BUN 15 12/31/2016   CO2 24 12/31/2016   TSH 1.64 05/13/2010   PSA 1.22 01/20/2016   INR 1.05 12/22/2016   HGBA1C 6.2 07/19/2016   MICROALBUR 1.4 05/13/2010     BNP (last 3 results) No results for input(s): BNP in the last 8760 hours.  ProBNP (last 3 results) No results for input(s): PROBNP in the last 8760 hours.   Other Studies Reviewed Today:  Echo Study Conclusions 09/2014  - Left ventricle: The cavity size was normal. Wall thickness was   normal. Systolic function was normal. The estimated ejection   fraction was in the range of 50% to 55%. - Aortic valve: There was trivial regurgitation. - Left atrium: The atrium was mildly dilated. - Right atrium: The atrium was mildly dilated. - Atrial septum: No defect or patent foramen ovale was identified.  Assessment/Plan:  1.  Cardiomyopathy: NYHA class I. Mild cardiomyopathy in past, EF 49% on cardiac MRI in 4/14 and 50-55% on echo in 2016.  No delayed enhancement on MRI.  There was a suggestion of noncompaction towards the apex.  This may be the cause of his mild cardiomyopathy.   He is doing well clinically without symptoms. Restart Losartan today for BP/renal protection in setting of DM and known NICM. He is on low dose Coreg - has had tendency towards bradycardia.   2. Hyperlipidemia: On statin therapy  3. HTN - restarting ARB today. He monitors at home  4. DM  5. Recent GI bleeding - with plan per GI. Remains off aspirin. No current bleeding noted.   Current medicines are reviewed with the patient today.  The patient does not have concerns regarding medicines other than what has been noted above.  The following changes have been made:  See above.  Labs/ tests ordered today include:   No orders of the defined types were placed in this encounter.    Disposition:   FU with me in about 4 months. He would like to follow with me going forward.   Patient is agreeable to this plan and will call if any problems develop in the interim.   SignedTruitt Merle, NP  01/31/2017 9:21 AM  [facility] [address] [place], [place]  [address] Phone: [phone] Fax: [phone]

## 2017-01-31 NOTE — Progress Notes (Signed)
He had an uneventful trip to the [place]  No blood in stool, no black stools, no GI sx in the meantime other than some occ GERD.  Has taken an [employer], with some relief.    His SBP was ~160 on his cuff, ~130 on our cuffs on mult checks.  I think his cuff is reading high.  D/w pt.    He hasn't restarted losartan yet, was advised to start per cards.    He has f/u labs pending for a few weeks from now.  Has GI f/u pending.    Ear ringing.  B ears, equally.  Intermittent, more noted when in quiet area.  No ear pain.  Ringing started a few weeks ago.  No hearing loss.  Recent air travel, unclear if sx predate the flight.  No rhinorrhea, no FCNAV.    Meds, vitals, and allergies reviewed.   ROS: Per HPI unless specifically indicated in ROS section   GEN: nad, alert and oriented HEENT: mucous membranes moist, tm w/o erythema, R SOM noted, nasal exam w/o erythema, scant clear discharge noted,  OP wnl, no movement on TM with valsalva B NECK: supple w/o LA CV: rrr.   PULM: ctab, no inc wob

## 2017-01-31 NOTE — Patient Instructions (Addendum)
We will be checking the following labs today - NONE   Medication Instructions:    Continue with your current medicines. BUT  I would like to restart your Losartan 25 mg once a day - you should have this at home    Testing/Procedures To Be Arranged:  N/A  Follow-Up:   See me in about 3 to 4 months    Other Special Instructions:   N/A    If you need a refill on your cardiac medications before your next appointment, please call your pharmacy.   Call the [facility] office at [phone] if you have any questions, problems or concerns.

## 2017-01-31 NOTE — Patient Instructions (Signed)
Take zyrtec, use flonase, and gently try to pop your ears.  See if that helps the ringing.  Take care.  Glad to see you.

## 2017-02-04 DIAGNOSIS — H6980 Other specified disorders of Eustachian tube, unspecified ear: Secondary | ICD-10-CM | POA: Insufficient documentation

## 2017-02-04 NOTE — Assessment & Plan Note (Signed)
Anatomy d/w pt.  Unclear if the tinnitus is related to ETD. Take zyrtec, use flonase, and gently try valsalva. He'll see if that helps the ringing.  We may need to refer to ENT.

## 2017-02-22 ENCOUNTER — Other Ambulatory Visit (INDEPENDENT_AMBULATORY_CARE_PROVIDER_SITE_OTHER): Payer: Medicare Other

## 2017-02-22 DIAGNOSIS — D649 Anemia, unspecified: Secondary | ICD-10-CM | POA: Diagnosis not present

## 2017-02-22 LAB — CBC WITH DIFFERENTIAL/PLATELET
Basophils Absolute: 0.1 10*3/uL (ref 0.0–0.1)
Basophils Relative: 1.4 % (ref 0.0–3.0)
EOS PCT: 1.5 % (ref 0.0–5.0)
Eosinophils Absolute: 0.1 10*3/uL (ref 0.0–0.7)
HCT: 45 % (ref 39.0–52.0)
Hemoglobin: 14.4 g/dL (ref 13.0–17.0)
LYMPHS ABS: 2.6 10*3/uL (ref 0.7–4.0)
Lymphocytes Relative: 40.6 % (ref 12.0–46.0)
MCHC: 31.9 g/dL (ref 30.0–36.0)
MCV: 83.9 fl (ref 78.0–100.0)
MONO ABS: 0.5 10*3/uL (ref 0.1–1.0)
Monocytes Relative: 7.9 % (ref 3.0–12.0)
NEUTROS PCT: 48.6 % (ref 43.0–77.0)
Neutro Abs: 3.2 10*3/uL (ref 1.4–7.7)
PLATELETS: 314 10*3/uL (ref 150.0–400.0)
RBC: 5.37 Mil/uL (ref 4.22–5.81)
RDW: 20.1 % — ABNORMAL HIGH (ref 11.5–15.5)
WBC: 6.5 10*3/uL (ref 4.0–10.5)

## 2017-02-22 LAB — IBC PANEL
IRON: 73 ug/dL (ref 42–165)
Saturation Ratios: 18.6 % — ABNORMAL LOW (ref 20.0–50.0)
Transferrin: 281 mg/dL (ref 212.0–360.0)

## 2017-02-24 ENCOUNTER — Other Ambulatory Visit: Payer: Self-pay | Admitting: Family Medicine

## 2017-02-24 DIAGNOSIS — D509 Iron deficiency anemia, unspecified: Secondary | ICD-10-CM

## 2017-03-13 ENCOUNTER — Other Ambulatory Visit: Payer: Self-pay | Admitting: Family Medicine

## 2017-03-14 ENCOUNTER — Ambulatory Visit (AMBULATORY_SURGERY_CENTER): Payer: Medicare Other | Admitting: Gastroenterology

## 2017-03-14 ENCOUNTER — Encounter: Payer: Self-pay | Admitting: Gastroenterology

## 2017-03-14 VITALS — BP 137/77 | HR 60 | Temp 97.3°F | Resp 12 | Ht 72.0 in | Wt 213.0 lb

## 2017-03-14 DIAGNOSIS — R1013 Epigastric pain: Secondary | ICD-10-CM | POA: Diagnosis not present

## 2017-03-14 DIAGNOSIS — D508 Other iron deficiency anemias: Secondary | ICD-10-CM

## 2017-03-14 DIAGNOSIS — Z86010 Personal history of colonic polyps: Secondary | ICD-10-CM

## 2017-03-14 DIAGNOSIS — K219 Gastro-esophageal reflux disease without esophagitis: Secondary | ICD-10-CM

## 2017-03-14 DIAGNOSIS — B9681 Helicobacter pylori [H. pylori] as the cause of diseases classified elsewhere: Secondary | ICD-10-CM | POA: Diagnosis not present

## 2017-03-14 DIAGNOSIS — E119 Type 2 diabetes mellitus without complications: Secondary | ICD-10-CM | POA: Diagnosis not present

## 2017-03-14 DIAGNOSIS — K2950 Unspecified chronic gastritis without bleeding: Secondary | ICD-10-CM | POA: Diagnosis not present

## 2017-03-14 DIAGNOSIS — I10 Essential (primary) hypertension: Secondary | ICD-10-CM | POA: Diagnosis not present

## 2017-03-14 DIAGNOSIS — K2980 Duodenitis without bleeding: Secondary | ICD-10-CM | POA: Diagnosis not present

## 2017-03-14 DIAGNOSIS — D509 Iron deficiency anemia, unspecified: Secondary | ICD-10-CM | POA: Diagnosis not present

## 2017-03-14 DIAGNOSIS — Z800 Family history of malignant neoplasm of digestive organs: Secondary | ICD-10-CM

## 2017-03-14 MED ORDER — SODIUM CHLORIDE 0.9 % IV SOLN: 500.0000 mL | INTRAVENOUS | Status: DC

## 2017-03-14 NOTE — Op Note (Signed)
[facility] Patient Name: Paul Fry Procedure Date: 03/14/2017 7:39 AM MRN: [id number] Endoscopist: Ladene Artist , MD Age: 71 Referring MD:  Date of Birth: 02-15-1946 Gender: Male Account #: [id number] Procedure:                Upper GI endoscopy Indications:              Iron deficiency anemia, epigastric pain, GERD Medicines:                Monitored Anesthesia Care Procedure:                Pre-Anesthesia Assessment:                           - Prior to the procedure, a History and Physical                            was performed, and patient medications and                            allergies were reviewed. The patient's tolerance of                            previous anesthesia was also reviewed. The risks                            and benefits of the procedure and the sedation                            options and risks were discussed with the patient.                            All questions were answered, and informed consent                            was obtained. Prior Anticoagulants: The patient has                            taken no previous anticoagulant or antiplatelet                            agents. ASA Grade Assessment: II - A patient with                            mild systemic disease. After reviewing the risks                            and benefits, the patient was deemed in                            satisfactory condition to undergo the procedure.                           After obtaining informed consent, the endoscope was  passed under direct vision. Throughout the                            procedure, the patient's blood pressure, pulse, and                            oxygen saturations were monitored continuously. The                            Endoscope was introduced through the mouth, and                            advanced to the second part of duodenum. The upper                            GI  endoscopy was accomplished without difficulty.                            The patient tolerated the procedure well. 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                           The exam of the esophagus was otherwise normal.                           Diffuse moderate inflammation characterized by                            erythema, friability and granularity was found in                            the entire examined stomach. Biopsies were taken                            with a cold forceps for histology.                           A small hiatal hernia was present.                           The exam of the stomach was otherwise normal.                           The duodenal bulb and second portion of the                            duodenum were normal. Biopsies for histology were                            taken with a cold forceps for evaluation of celiac  disease. Complications:            No immediate complications. Estimated Blood Loss:     Estimated blood loss was minimal. Impression:               - Gastritis. Biopsied.                           - Esophageala AVM.                           - Small hiatal hernia.                           - Normal duodenal bulb and second portion of the                            duodenum. Biopsied. Recommendation:           - Patient has a contact number available for                            emergencies. The signs and symptoms of potential                            delayed complications were discussed with the                            patient. Return to normal activities tomorrow.                            Written discharge instructions were provided to the                            patient.                           - Resume previous diet.                           - Continue present medications.                           - Await pathology  results. Ladene Artist, MD 03/14/2017 8:35:21 AM This report has been signed electronically.

## 2017-03-14 NOTE — Op Note (Signed)
[facility] Patient Name: Paul Fry Procedure Date: 03/14/2017 7:36 AM MRN: [id number] Endoscopist: Ladene Artist , MD Age: 71 Referring MD:  Date of Birth: May 15, 1945 Gender: Male Account #: [id number] Procedure:                Colonoscopy Indications:              Iron deficiency anemia, personal history of                            adenomatous colon polyps, family history of colon                            cancer. Medicines:                Monitored Anesthesia Care Procedure:                Pre-Anesthesia Assessment:                           - Prior to the procedure, a History and Physical                            was performed, and patient medications and                            allergies were reviewed. The patient's tolerance of                            previous anesthesia was also reviewed. The risks                            and benefits of the procedure and the sedation                            options and risks were discussed with the patient.                            All questions were answered, and informed consent                            was obtained. Prior Anticoagulants: The patient has                            taken no previous anticoagulant or antiplatelet                            agents. ASA Grade Assessment: II - A patient with                            mild systemic disease. After reviewing the risks                            and benefits, the patient was deemed in  satisfactory condition to undergo the procedure.                           After obtaining informed consent, the colonoscope                            was passed under direct vision. Throughout the                            procedure, the patient's blood pressure, pulse, and                            oxygen saturations were monitored continuously. The                            Colonoscope was introduced through the anus and                   advanced to the the cecum, identified by                            appendiceal orifice and ileocecal valve. The                            ileocecal valve, appendiceal orifice, and rectum                            were photographed. The quality of the bowel                            preparation was adequate after extensive lavage and                            suctioning. The colonoscopy was performed without                            difficulty. The patient tolerated the procedure                            well. Scope In: 8:05:53 AM Scope Out: 8:19:26 AM Scope Withdrawal Time: 0 hours 10 minutes 40 seconds  Total Procedure Duration: 0 hours 13 minutes 33 seconds  Findings:                 The perianal and digital rectal examinations were                            normal.                           Multiple medium-mouthed diverticula were found in                            the left colon. There was narrowing of the colon in  association with the diverticular opening. There                            was evidence of diverticular spasm. There was                            evidence of an impacted diverticulum. There was no                            evidence of diverticular bleeding.                           Scattered medium-mouthed diverticula were found in                            the right colon. There was no evidence of                            diverticular bleeding.                           The exam was otherwise without abnormality on                            direct and retroflexion views. Complications:            No immediate complications. Estimated blood loss:                            None. Estimated Blood Loss:     Estimated blood loss: none. Impression:               - Severe diverticulosis in the left colon. There                            was narrowing of the colon in association with the                             diverticular opening. There was evidence of                            diverticular spasm. There was evidence of an                            impacted diverticulum. There was no evidence of                            diverticular bleeding.                           - Mild diverticulosis in the right colon. There was                            no evidence of diverticular bleeding.                           -  The examination was otherwise normal on direct                            and retroflexion views.                           - No specimens collected. Recommendation:           - Repeat colonoscopy in 5 years for surveillance                            with a more extensive bowel prep.                           - Patient has a contact number available for                            emergencies. The signs and symptoms of potential                            delayed complications were discussed with the                            patient. Return to normal activities tomorrow.                            Written discharge instructions were provided to the                            patient.                           - Continue present medications.                           - High fiber diet indefinitely. Ladene Artist, MD 03/14/2017 8:30:00 AM This report has been signed electronically.

## 2017-03-14 NOTE — Progress Notes (Signed)
Report given to PACU, vss 

## 2017-03-14 NOTE — Patient Instructions (Signed)
**  Handouts given on diverticulosis, gastritis, and hiatal hernia**   YOU HAD AN ENDOSCOPIC PROCEDURE TODAY AT [facility]:   Refer to the procedure report that was given to you for any specific questions about what was found during the examination.  If the procedure report does not answer your questions, please call your gastroenterologist to clarify.  If you requested that your care partner not be given the details of your procedure findings, then the procedure report has been included in a sealed envelope for you to review at your convenience later.  YOU SHOULD EXPECT: Some feelings of bloating in the abdomen. Passage of more gas than usual.  Walking can help get rid of the air that was put into your GI tract during the procedure and reduce the bloating. If you had a lower endoscopy (such as a colonoscopy or flexible sigmoidoscopy) you may notice spotting of blood in your stool or on the toilet paper. If you underwent a bowel prep for your procedure, you may not have a normal bowel movement for a few days.  Please Note:  You might notice some irritation and congestion in your nose or some drainage.  This is from the oxygen used during your procedure.  There is no need for concern and it should clear up in a day or so.  SYMPTOMS TO REPORT IMMEDIATELY:   Following lower endoscopy (colonoscopy or flexible sigmoidoscopy):  Excessive amounts of blood in the stool  Significant tenderness or worsening of abdominal pains  Swelling of the abdomen that is new, acute  Fever of 100F or higher   Following upper endoscopy (EGD)  Vomiting of blood or coffee ground material  New chest pain or pain under the shoulder blades  Painful or persistently difficult swallowing  New shortness of breath  Fever of 100F or higher  Black, tarry-looking stools  For urgent or emergent issues, a gastroenterologist can be reached at any hour by calling [phone].   DIET:  We do recommend a small  meal at first, but then you may proceed to your regular diet.  Drink plenty of fluids but you should avoid alcoholic beverages for 24 hours.  ACTIVITY:  You should plan to take it easy for the rest of today and you should NOT DRIVE or use heavy machinery until tomorrow (because of the sedation medicines used during the test).    FOLLOW UP: Our staff will call the number listed on your records the next business day following your procedure to check on you and address any questions or concerns that you may have regarding the information given to you following your procedure. If we do not reach you, we will leave a message.  However, if you are feeling well and you are not experiencing any problems, there is no need to return our call.  We will assume that you have returned to your regular daily activities without incident.  If any biopsies were taken you will be contacted by phone or by letter within the next 1-3 weeks.  Please call us at [phone] if you have not heard about the biopsies in 3 weeks.    SIGNATURES/CONFIDENTIALITY: You and/or your care partner have signed paperwork which will be entered into your electronic medical record.  These signatures attest to the fact that that the information above on your After Visit Summary has been reviewed and is understood.  Full responsibility of the confidentiality of this discharge information lies with you and/or your care-partner.

## 2017-03-15 ENCOUNTER — Telehealth: Payer: Self-pay | Admitting: *Deleted

## 2017-03-15 NOTE — Telephone Encounter (Signed)
  Follow up Call-  Call back number 03/14/2017 11/22/2014  Post procedure Call Back phone  # [phone]  Permission to leave phone message Yes Yes  Some recent data might be hidden     Patient questions:  Do you have a fever, pain , or abdominal swelling? No. Pain Score  0 *  Have you tolerated food without any problems? Yes.    Have you been able to return to your normal activities? Yes.    Do you have any questions about your discharge instructions: Diet   No. Medications  No. Follow up visit  No.  Do you have questions or concerns about your Care? No.  Actions: * If pain score is 4 or above: No action needed, pain <4.

## 2017-03-23 ENCOUNTER — Other Ambulatory Visit: Payer: Self-pay

## 2017-03-23 ENCOUNTER — Telehealth: Payer: Self-pay | Admitting: Gastroenterology

## 2017-03-23 MED ORDER — OMEPRAZOLE 20 MG PO CPDR: 20.0000 mg | DELAYED_RELEASE_CAPSULE | Freq: Two times a day (BID) | ORAL | 0 refills | Status: DC

## 2017-03-23 MED ORDER — BIS SUBCIT-METRONID-TETRACYC 140-125-125 MG PO CAPS: 3.0000 | ORAL_CAPSULE | Freq: Three times a day (TID) | ORAL | 0 refills | Status: DC

## 2017-03-23 NOTE — Telephone Encounter (Signed)
See result note.  

## 2017-03-23 NOTE — Telephone Encounter (Signed)
Patient returning nurse call about path results from 11.5.18.

## 2017-03-23 NOTE — Telephone Encounter (Signed)
Linda lvm for patient, routed.

## 2017-03-24 ENCOUNTER — Other Ambulatory Visit: Payer: Self-pay

## 2017-03-24 ENCOUNTER — Telehealth: Payer: Self-pay

## 2017-03-24 DIAGNOSIS — N401 Enlarged prostate with lower urinary tract symptoms: Secondary | ICD-10-CM | POA: Diagnosis not present

## 2017-03-24 DIAGNOSIS — R3912 Poor urinary stream: Secondary | ICD-10-CM | POA: Diagnosis not present

## 2017-03-24 MED ORDER — BISMUTH SUBSALICYLATE 262 MG PO TABS: 524.0000 mg | ORAL_TABLET | Freq: Three times a day (TID) | ORAL | 0 refills | Status: DC

## 2017-03-24 MED ORDER — METRONIDAZOLE 500 MG PO TABS: 500.0000 mg | ORAL_TABLET | Freq: Three times a day (TID) | ORAL | 0 refills | Status: DC

## 2017-03-24 MED ORDER — DOXYCYCLINE HYCLATE 100 MG PO TBEC: 100.0000 mg | DELAYED_RELEASE_TABLET | Freq: Two times a day (BID) | ORAL | 0 refills | Status: DC

## 2017-03-24 NOTE — Telephone Encounter (Signed)
Pepto bismol tablets: 2 po tid for 14d Doxycyline 100 mg po bid for 14d Metronidazole 500 mg po tid for 14d PPI po bid for 14d

## 2017-03-24 NOTE — Telephone Encounter (Signed)
Spoke with pt and he is aware, scripts sent to pharmacy. 

## 2017-03-24 NOTE — Telephone Encounter (Signed)
Pt called and states he cannot afford the [place], it was going to be 1000.00. Please advise.

## 2017-03-30 ENCOUNTER — Telehealth: Payer: Self-pay

## 2017-03-30 MED ORDER — AMBULATORY NON FORMULARY MEDICATION: 0 refills | Status: DC

## 2017-03-30 NOTE — Telephone Encounter (Signed)
Pharmacy sent fax saying that the doxycycline hyc dr 100mg  not covered but doxy IR or minocycline preferred.  Please advise Sir, thank you.

## 2017-03-30 NOTE — Telephone Encounter (Signed)
OK doxy IR 100 mg po bid, 14 days.

## 2017-03-30 NOTE — Telephone Encounter (Signed)
I called and gave this replacement rx to the pharmacy.

## 2017-05-17 ENCOUNTER — Ambulatory Visit (INDEPENDENT_AMBULATORY_CARE_PROVIDER_SITE_OTHER): Payer: Medicare Other | Admitting: Nurse Practitioner

## 2017-05-17 ENCOUNTER — Encounter: Payer: Self-pay | Admitting: Nurse Practitioner

## 2017-05-17 VITALS — BP 120/80 | HR 80 | Ht 72.0 in | Wt 213.4 lb

## 2017-05-17 DIAGNOSIS — I259 Chronic ischemic heart disease, unspecified: Secondary | ICD-10-CM | POA: Diagnosis not present

## 2017-05-17 DIAGNOSIS — I428 Other cardiomyopathies: Secondary | ICD-10-CM

## 2017-05-17 DIAGNOSIS — E7800 Pure hypercholesterolemia, unspecified: Secondary | ICD-10-CM

## 2017-05-17 NOTE — Progress Notes (Signed)
CARDIOLOGY OFFICE NOTE  Date:  05/17/2017    Paul Fry Date of Birth: [birth year] Medical Record #[id number]  PCP:  [name], MD  Cardiologist:  [name] - to establish with Dr. [name]  Chief Complaint  Patient presents with  . Coronary Artery Disease  . Cardiomyopathy    Follow up visit - former patient of Dr. [name]    History of Present Illness: Paul Fry is a 72 y.o. male who presents today for a follow up visit. Former patient of Dr. [name].   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  He had a cath in 5/10 with mild nonobstructive CAD. He had atypical chest pain in 3/13 and ETT-myoview was done, showing EF 47%, no ischemia or infarction. Given the abnormal EF, he had an echo done which showed EF 50-55% with mild LVH. Cardiac MR was done in 4/14, showing EF 49% with distal septal and apical hypokinesis, no delayed enhancement. Most recent echo in 2016 again showed EF 50-55%.   Last seen by Dr. [name] in 2017 and was doing well. He is not on ASA due to prior GI bleeding on aspirin.   I then saw him this past September - he was doing well. ARB was restarted for his NICM/renal protection, etc. Remains off of aspirin due to prior GI bleed. Overall, felt to be stable.   Comes in today. Here with his wife. Continues to do well. No chest pain. Not short of breath. Not dizzy or lightheaded. No syncope. Weight is stable. Not as active due to the bad weather. He is tolerating his medicines. Overall, they are both happy with how he is doing.   PMH: 1. GERD 2. Plantar fasciitis 3. Hyperlipidemia 4. Diverticulosis with diverticular bleeding x 2. Stopped ASA. 5. Left heart cath 5/10: EF 55%, 30-40% mid RCA.  6. Cardiomyopathy: Mild. ETT-Myoview (3/13) with EF 47%, no ischemia or infarction. Echo (3/13) with EF 50-55%, mild LVH, mild MR, mild AI. Cardiac MRI (4/14) with EF 49%, possible noncompaction, apical septal and apical  hypokinesis, mild AI, no delayed enhancement.  - Echo (2016) with EF 50-55%, mild biatrial enlargement.  7. Type II diabetes   Past Medical History:  Diagnosis Date  . Allergy    SEASONAL  . Anemia   . Arthritis    KNEES  . BPH (benign prostatic hyperplasia)   . Diverticulosis of colon   . Elevated PSA    1.22 on 01-20-2016  . Feeling of incomplete bladder emptying   . GERD (gastroesophageal reflux disease)   . Hiatal hernia   . History of adenomatous polyp of colon    2014 tubular adenoma  . History of chronic gastritis   . History of Helicobacter pylori infection    07/ 2016  . History of lower GI bleeding    07/ 2010 and 12/ 2011  diverticular bleed both times  . Hyperlipidemia   . Hypertension   . Lower urinary tract symptoms (LUTS)   . Nonischemic cardiomyopathy ([place])   . Plantar fascia syndrome   . Strains to urinate    INTERMITTANT  . Type 2 diabetes, diet controlled ([place])    ON BORDER LINE,NEVER TOOK MEDICATION.  [name] Wears glasses     Past Surgical History:  Procedure Laterality Date  . APPENDECTOMY  teen  . CARDIAC CATHETERIZATION  09-10-2008   dr dalton mclean   mild non-obstructive CAD;  30-40% mRCA, 20%pLAD,  ef 55%  . CARDIOVASCULAR STRESS TEST  07/29/2011   Low risk nuclear study w/ no ischemia or infarct/  LVEF 47% and  apical hypokinesis (08-07-2012 cardiac MRI -- EF 49% w/ no hyperenhancement distal septal and apical hypokinesis, mild LAE, mild RVE, mild AR with mild dilated sinus 45mm)  . CATARACT EXTRACTION W/ INTRAOCULAR LENS  IMPLANT, BILATERAL  2016  . COLONOSCOPY  last one 12-21-2012  . CYSTOSCOPY WITH INSERTION OF UROLIFT N/A 06/17/2016   Procedure: CYSTOSCOPY WITH INSERTION OF UROLIFT;  Surgeon: [name], MD;  Location: [facility];  Service: Urology;  Laterality: N/A;  . ESOPHAGOGASTRODUODENOSCOPY  last one 11-22-2014  . TRANSTHORACIC ECHOCARDIOGRAM  09/17/2014   ef 50-55%/  trivial AR, MR and PR/  mild LAE and RAE/   mild TR     Medications: Current Meds  Medication Sig  . ascorbic acid (VITAMIN C) 1000 MG tablet Take 1,000 mg by mouth daily.  [name] atorvastatin (LIPITOR) 20 MG tablet Take 1 tablet (20 mg total) by mouth every morning.  . calcium carbonate (TUMS) 500 MG chewable tablet Chew 1 tablet by mouth as needed for indigestion. Reported on 07/17/2015  . carvedilol (COREG) 3.125 MG tablet Take 1 tablet (3.125 mg total) by mouth 2 (two) times daily.  . cetirizine (ZYRTEC) 10 MG tablet Take 10 mg by mouth daily as needed for allergies.   . fish oil-omega-3 fatty acids 1000 MG capsule Take 1 g by mouth daily.  . fluticasone (FLONASE) 50 MCG/ACT nasal spray Place 2 sprays into both nostrils daily.  . Multiple Vitamin (MULTIVITAMIN) tablet Take 1 tablet by mouth daily.  [name] omeprazole (PRILOSEC) 20 MG capsule Take 1 capsule (20 mg total) by mouth 2 (two) times daily.  . tamsulosin (FLOMAX) 0.4 MG CAPS capsule TAKE 2 CAPSULES EVERY DAY  . [DISCONTINUED] omeprazole (PRILOSEC) 20 MG capsule Take 1 capsule (20 mg total) 2 (two) times daily before a meal by mouth.     Allergies: Allergies  Allergen Reactions  . Aspirin Other (See Comments)    History of GI bleed    Social History: The patient  reports that  has never smoked. he has never used smokeless tobacco. He reports that he drinks alcohol. He reports that he does not use drugs.   Family History: The patient's family history includes Colon cancer (age of onset: 23) in his mother; Colon polyps in his brother; Heart disease in his sister; Hypertension in his sister and sister; Kidney disease in his sister; Lung cancer in his brother.   Review of Systems: Please see the history of present illness.   Otherwise, the review of systems is positive for none.   All other systems are reviewed and negative.   Physical Exam: VS:  BP 120/80 (BP Location: Left Arm, Patient Position: Sitting, Cuff Size: Normal)   Pulse 80   Ht 6' (1.829 m)   Wt 213 lb 6.4 oz  (96.8 kg)   BMI 28.94 kg/m  .  BMI Body mass index is 28.94 kg/m.  Wt Readings from Last 3 Encounters:  05/17/17 213 lb 6.4 oz (96.8 kg)  03/14/17 213 lb (96.6 kg)  01/31/17 213 lb 0.4 oz (96.6 kg)    General: Pleasant. Well developed, well nourished and in no acute distress.   HEENT: Normal.  Neck: Supple, no JVD, carotid bruits, or masses noted.  Cardiac: Regular rate and rhythm. No murmurs, rubs, or gallops. No edema.  Respiratory:  Lungs are clear to auscultation bilaterally with normal work of breathing.  GI: Soft and nontender.  MS: No deformity or atrophy. Gait and ROM intact.  Skin: Warm and dry. Color is normal.  Neuro:  Strength and sensation are intact and no gross focal deficits noted.  Psych: Alert, appropriate and with normal affect.   LABORATORY DATA:  EKG:  EKG is ordered today. This demonstrates NSR.  Lab Results  Component Value Date   WBC 6.5 02/22/2017   HGB 14.4 02/22/2017   HCT 45.0 02/22/2017   PLT 314.0 02/22/2017   GLUCOSE 141 (H) 12/31/2016   CHOL 149 07/19/2016   TRIG 99.0 07/19/2016   HDL 33.20 (L) 07/19/2016   LDLCALC 96 07/19/2016   ALT 23 12/23/2016   AST 23 12/23/2016   NA 139 12/31/2016   K 4.5 12/31/2016   CL 105 12/31/2016   CREATININE 1.00 12/31/2016   BUN 15 12/31/2016   CO2 24 12/31/2016   TSH 1.64 05/13/2010   PSA 1.22 01/20/2016   INR 1.05 12/22/2016   HGBA1C 6.2 07/19/2016   MICROALBUR 1.4 05/13/2010     BNP (last 3 results) No results for input(s): BNP in the last 8760 hours.  ProBNP (last 3 results) No results for input(s): PROBNP in the last 8760 hours.   Other Studies Reviewed Today:   Echo Study Conclusions 09/2014  - Left ventricle: The cavity size was normal. Wall thickness was normal. Systolic function was normal. The estimated ejection fraction was in the range of 50% to 55%. - Aortic valve: There was trivial regurgitation. - Left atrium: The atrium was mildly dilated. - Right atrium: The  atrium was mildly dilated. - Atrial septum: No defect or patent foramen ovale was identified.  Assessment/Plan:  1. Cardiomyopathy: NYHA class I. Mild cardiomyopathy in past, EF 49% on cardiac MRI in 4/14 and 50-55% on echo in 2016. No delayed enhancement on MRI. There was a suggestion of noncompaction towards the apex. This may be the cause of his mild cardiomyopathy.   He continues to do well. On good CHF regimen. No symptoms. No changes made today. Will get him to establish with Dr. [name]. I will see back as needed.   2. Hyperlipidemia: On statin therapy - seeing PCP in March with physical/labs  3. HTN - BP looks great on his current regimen.   4. DM - followed by PCP  5. Recent GI bleeding - with plan per GI. Remains off aspirin. Bleeding has not recurred.   Current medicines are reviewed with the patient today.  The patient does not have concerns regarding medicines other than what has been noted above.  The following changes have been made:  See above.  Labs/ tests ordered today include:    Orders Placed This Encounter  Procedures  . EKG 12-Lead     Disposition:   Will get him established with Dr. [name].   Patient is agreeable to this plan and will call if any problems develop in the interim.   SignedTruitt Merle, NP  05/17/2017 10:01 AM  [facility] [address] [place], [place]  [address] Phone: [phone] Fax: [phone]

## 2017-05-17 NOTE — Patient Instructions (Addendum)
We will be checking the following labs today - NONE   Medication Instructions:    Continue with your current medicines.     Testing/Procedures To Be Arranged:  N/A  Follow-Up:   New patient visit with Dr. [name] in April/May    Other Special Instructions:   N/A    If you need a refill on your cardiac medications before your next appointment, please call your pharmacy.   Call the [place] office at [phone] if you have any questions, problems or concerns.

## 2017-07-17 ENCOUNTER — Other Ambulatory Visit: Payer: Self-pay | Admitting: Family Medicine

## 2017-07-17 DIAGNOSIS — R739 Hyperglycemia, unspecified: Secondary | ICD-10-CM

## 2017-07-17 DIAGNOSIS — I10 Essential (primary) hypertension: Secondary | ICD-10-CM

## 2017-07-17 DIAGNOSIS — D62 Acute posthemorrhagic anemia: Secondary | ICD-10-CM

## 2017-07-22 ENCOUNTER — Ambulatory Visit: Payer: Medicare Other

## 2017-07-22 ENCOUNTER — Ambulatory Visit (INDEPENDENT_AMBULATORY_CARE_PROVIDER_SITE_OTHER): Payer: Medicare Other

## 2017-07-22 VITALS — BP 118/72 | HR 74 | Temp 97.9°F | Ht 70.5 in | Wt 212.5 lb

## 2017-07-22 DIAGNOSIS — R739 Hyperglycemia, unspecified: Secondary | ICD-10-CM

## 2017-07-22 DIAGNOSIS — I10 Essential (primary) hypertension: Secondary | ICD-10-CM | POA: Diagnosis not present

## 2017-07-22 DIAGNOSIS — Z0000 Encounter for general adult medical examination without abnormal findings: Secondary | ICD-10-CM | POA: Diagnosis not present

## 2017-07-22 DIAGNOSIS — D62 Acute posthemorrhagic anemia: Secondary | ICD-10-CM | POA: Diagnosis not present

## 2017-07-22 LAB — LIPID PANEL
CHOL/HDL RATIO: 4
Cholesterol: 130 mg/dL (ref 0–200)
HDL: 30.8 mg/dL — AB (ref >39.00)
LDL CALC: 76 mg/dL (ref 0–99)
NonHDL: 99.66
TRIGLYCERIDES: 117 mg/dL (ref 0.0–149.0)
VLDL: 23.4 mg/dL (ref 0.0–40.0)

## 2017-07-22 LAB — HEMOGLOBIN A1C: Hgb A1c MFr Bld: 6.2 % (ref 4.6–6.5)

## 2017-07-22 LAB — CBC WITH DIFFERENTIAL/PLATELET
BASOS ABS: 0.1 10*3/uL (ref 0.0–0.1)
Basophils Relative: 1.5 % (ref 0.0–3.0)
Eosinophils Absolute: 0.1 10*3/uL (ref 0.0–0.7)
Eosinophils Relative: 1.3 % (ref 0.0–5.0)
HEMATOCRIT: 45.6 % (ref 39.0–52.0)
Hemoglobin: 15.3 g/dL (ref 13.0–17.0)
LYMPHS PCT: 37.5 % (ref 12.0–46.0)
Lymphs Abs: 2.2 10*3/uL (ref 0.7–4.0)
MCHC: 33.5 g/dL (ref 30.0–36.0)
MCV: 88.9 fl (ref 78.0–100.0)
MONOS PCT: 7.4 % (ref 3.0–12.0)
Monocytes Absolute: 0.4 10*3/uL (ref 0.1–1.0)
NEUTROS ABS: 3.1 10*3/uL (ref 1.4–7.7)
Neutrophils Relative %: 52.3 % (ref 43.0–77.0)
PLATELETS: 318 10*3/uL (ref 150.0–400.0)
RBC: 5.13 Mil/uL (ref 4.22–5.81)
RDW: 15 % (ref 11.5–15.5)
WBC: 5.9 10*3/uL (ref 4.0–10.5)

## 2017-07-22 LAB — COMPREHENSIVE METABOLIC PANEL
ALT: 26 U/L (ref 0–53)
AST: 22 U/L (ref 0–37)
Albumin: 4 g/dL (ref 3.5–5.2)
Alkaline Phosphatase: 64 U/L (ref 39–117)
BUN: 11 mg/dL (ref 6–23)
CALCIUM: 9.3 mg/dL (ref 8.4–10.5)
CHLORIDE: 103 meq/L (ref 96–112)
CO2: 31 meq/L (ref 19–32)
Creatinine, Ser: 0.87 mg/dL (ref 0.40–1.50)
GFR: 111.03 mL/min (ref >60.00)
Glucose, Bld: 119 mg/dL — ABNORMAL HIGH (ref 70–99)
POTASSIUM: 4.1 meq/L (ref 3.5–5.1)
Sodium: 139 mEq/L (ref 135–145)
Total Bilirubin: 0.9 mg/dL (ref 0.2–1.2)
Total Protein: 7.4 g/dL (ref 6.0–8.3)

## 2017-07-22 LAB — IRON: Iron: 80 ug/dL (ref 42–165)

## 2017-07-22 NOTE — Progress Notes (Signed)
PCP notes:  Health maintenance:  No gaps identified.  Abnormal screenings:   Hearing - failed  Hearing Screening   125Hz  250Hz  500Hz  1000Hz  2000Hz  3000Hz  4000Hz  6000Hz  8000Hz   Right ear:   40 40 40  40    Left ear:   40 40 40  0     Patient concerns:   Pt reports concern with intermittent pain in right hand. States wearing brace as instructed by PCP.  Nurse concerns:  None  Next PCP appt:   07/29/17 @ 0945  I reviewed health advisor's note, was available for consultation on the day of service listed in this note, and agree with documentation and plan. Elsie Stain, MD.

## 2017-07-22 NOTE — Progress Notes (Signed)
Subjective:   Paul Fry is a 72 y.o. male who presents for Medicare Annual/Subsequent preventive examination.  Review of Systems:  N/A Cardiac Risk Factors include: advanced age (>78men, >74 women);male gender;hypertension;dyslipidemia;obesity (BMI >30kg/m2)     Objective:    Vitals: BP 118/72 (BP Location: Right Arm, Patient Position: Sitting, Cuff Size: Normal)   Pulse 74   Temp 97.9 F (36.6 C) (Oral)   Ht 5' 10.5" (1.791 m) Comment: no shoes  Wt 212 lb 8 oz (96.4 kg)   SpO2 97%   BMI 30.06 kg/m   Body mass index is 30.06 kg/m.  Advanced Directives 07/22/2017 12/23/2016 12/22/2016 06/17/2016 11/22/2014  Does Patient Have a Medical Advance Directive? Yes No No Yes Yes  Type of Paramedic of [place];Living will - - Living will -  Does patient want to make changes to medical advance directive? - - - No - Patient declined -  Copy of [place] in Chart? No - copy requested - - - No - copy requested  Would patient like information on creating a medical advance directive? - No - Patient declined - - -    Tobacco Social History   Tobacco Use  Smoking Status Never Smoker  Smokeless Tobacco Never Used     Counseling given: No   Clinical Intake:  Pre-visit preparation completed: Yes  Pain : No/denies pain Pain Score: 0-No pain     Nutritional Status: BMI > 30  Obese Nutritional Risks: None Diabetes: No  How often do you need to have someone help you when you read instructions, pamphlets, or other written materials from your doctor or pharmacy?: 1 - Never What is the last grade level you completed in school?: 12th grade  Interpreter Needed?: No  Comments: pt lives with spouse Information entered by :: LPinson, LPN  Past Medical History:  Diagnosis Date  . Allergy    SEASONAL  . Anemia   . Arthritis    KNEES  . BPH (benign prostatic hyperplasia)   . Diverticulosis of colon   . Elevated PSA    1.22 on  01-20-2016  . Feeling of incomplete bladder emptying   . GERD (gastroesophageal reflux disease)   . Hiatal hernia   . History of adenomatous polyp of colon    2014 tubular adenoma  . History of chronic gastritis   . History of Helicobacter pylori infection    07/ 2016  . History of lower GI bleeding    07/ 2010 and 12/ 2011  diverticular bleed both times  . Hyperlipidemia   . Hypertension   . Lower urinary tract symptoms (LUTS)   . Nonischemic cardiomyopathy ([place])   . Plantar fascia syndrome   . Strains to urinate    INTERMITTANT  . Type 2 diabetes, diet controlled ([place])    ON BORDER LINE,NEVER TOOK MEDICATION.  [name] Wears glasses    Past Surgical History:  Procedure Laterality Date  . APPENDECTOMY  teen  . CARDIAC CATHETERIZATION  09-10-2008   dr dalton mclean   mild non-obstructive CAD;  30-40% mRCA, 20%pLAD,  ef 55%  . CARDIOVASCULAR STRESS TEST  07/29/2011   Low risk nuclear study w/ no ischemia or infarct/  LVEF 47% and  apical hypokinesis (08-07-2012 cardiac MRI -- EF 49% w/ no hyperenhancement distal septal and apical hypokinesis, mild LAE, mild RVE, mild AR with mild dilated sinus 46mm)  . CATARACT EXTRACTION W/ INTRAOCULAR LENS  IMPLANT, BILATERAL  2016  . COLONOSCOPY  last  one 12-21-2012  . CYSTOSCOPY WITH INSERTION OF UROLIFT N/A 06/17/2016   Procedure: CYSTOSCOPY WITH INSERTION OF UROLIFT;  Surgeon: [name], MD;  Location: [facility];  Service: Urology;  Laterality: N/A;  . ESOPHAGOGASTRODUODENOSCOPY  last one 11-22-2014  . TRANSTHORACIC ECHOCARDIOGRAM  09/17/2014   ef 50-55%/  trivial AR, MR and PR/  mild LAE and RAE/  mild TR   Family History  Problem Relation Age of Onset  . Colon polyps Brother   . Colon cancer Mother 33  . Lung cancer Brother   . Heart disease Sister        MI, pace maker  . Kidney disease Sister        renal failure, nephrectomy- not cancer  . Hypertension Sister   . Hypertension Sister   . Diabetes Neg Hx         DM  . Depression Neg Hx   . Stroke Neg Hx   . Alcohol abuse Neg Hx        also no drug abuse   . Prostate cancer Neg Hx    Social History   Socioeconomic History  . Marital status: Married    Spouse name: None  . Number of children: 0  . Years of education: None  . Highest education level: None  Social Needs  . Financial resource strain: None  . Food insecurity - worry: None  . Food insecurity - inability: None  . Transportation needs - medical: None  . Transportation needs - non-medical: None  Occupational History  . Occupation: truck Geophysicist/field seismologist- retired    Fish farm manager: RETIRED  Tobacco Use  . Smoking status: Never Smoker  . Smokeless tobacco: Never Used  Substance and Sexual Activity  . Alcohol use: Yes    Comment: rarely drinks beer  . Drug use: No  . Sexual activity: None  Other Topics Concern  . None  Social History Narrative   Married 2003, no children.    Retired Administrator, gets regular exercise- walking.    Step daughter is MD in [place] Encounter Medications as of 07/22/2017  Medication Sig  . ascorbic acid (VITAMIN C) 1000 MG tablet Take 1,000 mg by mouth daily.  [name] atorvastatin (LIPITOR) 20 MG tablet Take 1 tablet (20 mg total) by mouth every morning.  . calcium carbonate (TUMS) 500 MG chewable tablet Chew 1 tablet by mouth as needed for indigestion. Reported on 07/17/2015  . carvedilol (COREG) 3.125 MG tablet Take 1 tablet (3.125 mg total) by mouth 2 (two) times daily.  . cetirizine (ZYRTEC) 10 MG tablet Take 10 mg by mouth daily as needed for allergies.   . fish oil-omega-3 fatty acids 1000 MG capsule Take 1 g by mouth daily.  . fluticasone (FLONASE) 50 MCG/ACT nasal spray Place 2 sprays into both nostrils daily.  . Multiple Vitamin (MULTIVITAMIN) tablet Take 1 tablet by mouth daily.  [name] omeprazole (PRILOSEC) 20 MG capsule Take 1 capsule (20 mg total) by mouth 2 (two) times daily.  . tamsulosin (FLOMAX) 0.4 MG CAPS capsule TAKE 2 CAPSULES EVERY  DAY  . losartan (COZAAR) 25 MG tablet Take 1 tablet (25 mg total) by mouth daily.   No facility-administered encounter medications on file as of 07/22/2017.     Activities of Daily Living In your present state of health, do you have any difficulty performing the following activities: 07/22/2017 12/23/2016  Hearing? N N  Vision? N N  Difficulty concentrating or making decisions? N N  Walking or climbing stairs? N N  Dressing or bathing? N N  Doing errands, shopping? N N  Preparing Food and eating ? N -  Using the Toilet? N -  In the past six months, have you accidently leaked urine? N -  Do you have problems with loss of bowel control? N -  Managing your Medications? N -  Managing your Finances? N -  Housekeeping or managing your Housekeeping? N -  Some recent data might be hidden    Patient Care Team: [name], MD as PCP - General (Family Medicine) Burtis Junes, NP as PCP - Cardiology (Nurse Practitioner)   Assessment:   This is a routine wellness examination for [place].   Hearing Screening   125Hz  250Hz  500Hz  1000Hz  2000Hz  3000Hz  4000Hz  6000Hz  8000Hz   Right ear:   40 40 40  40    Left ear:   40 40 40  0    Vision Screening Comments: Last vision exam in Apr 2018 with Dr. Katy Apo  Exercise Activities and Dietary recommendations Current Exercise Habits: Home exercise routine, Type of exercise: walking, Time (Minutes): 30, Frequency (Times/Week): 3, Weekly Exercise (Minutes/Week): 90, Intensity: Mild, Exercise limited by: None identified  Goals    . Increase physical activity     Starting 07/22/2017, I will continue to walk for 30 minutes 3 days per week.        Fall Risk Fall Risk  07/22/2017 07/27/2016 07/17/2015 07/16/2014 07/09/2013  Falls in the past year? No No No No No   Depression Screen PHQ 2/9 Scores 07/22/2017 07/27/2016 07/17/2015 07/16/2014  PHQ - 2 Score 0 0 0 0  PHQ- 9 Score 0 - - -    Cognitive Function MMSE - Mini Mental State Exam 07/22/2017    Orientation to time 5  Orientation to Place 5  Registration 3  Attention/ Calculation 0  Recall 3  Language- name 2 objects 0  Language- repeat 1  Language- follow 3 step command 3  Language- read & follow direction 0  Write a sentence 0  Copy design 0  Total score 20     PLEASE NOTE: A Mini-Cog screen was completed. Maximum score is 20. A value of 0 denotes this part of [place] MMSE was not completed or the patient failed this part of the Mini-Cog screening.   Mini-Cog Screening Orientation to Time - Max 5 pts Orientation to Place - Max 5 pts Registration - Max 3 pts Recall - Max 3 pts Language Repeat - Max 1 pts Language Follow 3 Step Command - Max 3 pts     Immunization History  Administered Date(s) Administered  . Hepatitis A, Adult 11/07/2014  . Influenza Split 05/25/2011  . Influenza Whole 04/25/2010  . Influenza, Seasonal, Injecte, Preservative Fre 05/25/2012  . Influenza,inj,Quad PF,6+ Mos 02/26/2014, 02/05/2016, 01/31/2017  . Pneumococcal Conjugate-13 07/16/2014  . Pneumococcal Polysaccharide-23 04/25/2010, 07/17/2015  . Td 05/11/1998, 09/18/2008  . Zoster 06/17/2011   Screening Tests Health Maintenance  Topic Date Due  . TETANUS/TDAP  09/19/2018  . COLONOSCOPY  03/14/2022  . INFLUENZA VACCINE  Completed  . Hepatitis C Screening  Completed  . PNA vac Low Risk Adult  Completed      Plan:     I have personally reviewed, addressed, and noted the following in the patient's chart:  A. Medical and social history B. Use of alcohol, tobacco or illicit drugs  C. Current medications and supplements D. Functional ability and status E.  Nutritional status  F.  Physical activity G. Advance directives H. List of other physicians I.  Hospitalizations, surgeries, and ER visits in previous 12 months J.  [place] to include hearing, vision, cognitive, depression L. Referrals and appointments - none  In addition, I have reviewed and discussed with  patient certain preventive protocols, quality metrics, and best practice recommendations. A written personalized care plan for preventive services as well as general preventive health recommendations were provided to patient.  See attached scanned questionnaire for additional information.   Signed,   Lindell Noe, MHA, BS, LPN Health Coach

## 2017-07-22 NOTE — Patient Instructions (Signed)
Mr. Brunty , Thank you for taking time to come for your Medicare Wellness Visit. I appreciate your ongoing commitment to your health goals. Please review the following plan we discussed and let me know if I can assist you in the future.   These are the goals we discussed: Goals    . Increase physical activity     Starting 07/22/2017, I will continue to walk for 30 minutes 3 days per week.        This is a list of the screening recommended for you and due dates:  Health Maintenance  Topic Date Due  . Tetanus Vaccine  09/19/2018  . Colon Cancer Screening  03/14/2022  . Flu Shot  Completed  .  Hepatitis C: One time screening is recommended by Center for Disease Control  (CDC) for  adults born from [birth year] through 1965.   Completed  . Pneumonia vaccines  Completed   Preventive Care for Adults  A healthy lifestyle and preventive care can promote health and wellness. Preventive health guidelines for adults include the following key practices.  . A routine yearly physical is a good way to check with your health care provider about your health and preventive screening. It is a chance to share any concerns and updates on your health and to receive a thorough exam.  . Visit your dentist for a routine exam and preventive care every 6 months. Brush your teeth twice a day and floss once a day. Good oral hygiene prevents tooth decay and gum disease.  . The frequency of eye exams is based on your age, health, family medical history, use  of contact lenses, and other factors. Follow your health care provider's recommendations for frequency of eye exams.  . Eat a healthy diet. Foods like vegetables, fruits, whole grains, low-fat dairy products, and lean protein foods contain the nutrients you need without too many calories. Decrease your intake of foods high in solid fats, added sugars, and salt. Eat the right amount of calories for you. Get information about a proper diet from your health care provider,  if necessary.  . Regular physical exercise is one of the most important things you can do for your health. Most adults should get at least 150 minutes of moderate-intensity exercise (any activity that increases your heart rate and causes you to sweat) each week. In addition, most adults need muscle-strengthening exercises on 2 or more days a week.  Silver Sneakers may be a benefit available to you. To determine eligibility, you may visit the website: www.silversneakers.com or contact program at 614-006-6072 Mon-Fri between 8AM-8PM.   . Maintain a healthy weight. The body mass index (BMI) is a screening tool to identify possible weight problems. It provides an estimate of body fat based on height and weight. Your health care provider can find your BMI and can help you achieve or maintain a healthy weight.   For adults 20 years and older: ? A BMI below 18.5 is considered underweight. ? A BMI of 18.5 to 24.9 is normal. ? A BMI of 25 to 29.9 is considered overweight. ? A BMI of 30 and above is considered obese.   . Maintain normal blood lipids and cholesterol levels by exercising and minimizing your intake of saturated fat. Eat a balanced diet with plenty of fruit and vegetables. Blood tests for lipids and cholesterol should begin at age 75 and be repeated every 5 years. If your lipid or cholesterol levels are high, you are over 50, or you  are at high risk for heart disease, you may need your cholesterol levels checked more frequently. Ongoing high lipid and cholesterol levels should be treated with medicines if diet and exercise are not working.  . If you smoke, find out from your health care provider how to quit. If you do not use tobacco, please do not start.  . If you choose to drink alcohol, please do not consume more than 2 drinks per day. One drink is considered to be 12 ounces (355 mL) of beer, 5 ounces (148 mL) of wine, or 1.5 ounces (44 mL) of liquor.  . If you are 38-23 years old, ask  your health care provider if you should take aspirin to prevent strokes.  . Use sunscreen. Apply sunscreen liberally and repeatedly throughout the day. You should seek shade when your shadow is shorter than you. Protect yourself by wearing long sleeves, pants, a wide-brimmed hat, and sunglasses year round, whenever you are outdoors.  . Once a month, do a whole body skin exam, using a mirror to look at the skin on your back. Tell your health care provider of new moles, moles that have irregular borders, moles that are larger than a pencil eraser, or moles that have changed in shape or color.

## 2017-07-29 ENCOUNTER — Ambulatory Visit (INDEPENDENT_AMBULATORY_CARE_PROVIDER_SITE_OTHER): Payer: Medicare Other | Admitting: Family Medicine

## 2017-07-29 ENCOUNTER — Ambulatory Visit (INDEPENDENT_AMBULATORY_CARE_PROVIDER_SITE_OTHER)
Admission: RE | Admit: 2017-07-29 | Discharge: 2017-07-29 | Disposition: A | Discharge status: Home / Self Care | Payer: Medicare Other | Source: Ambulatory Visit | Attending: Family Medicine | Admitting: Family Medicine

## 2017-07-29 ENCOUNTER — Encounter: Payer: Self-pay | Admitting: Family Medicine

## 2017-07-29 VITALS — BP 118/72 | HR 74 | Temp 97.9°F | Ht 70.5 in | Wt 212.5 lb

## 2017-07-29 DIAGNOSIS — I259 Chronic ischemic heart disease, unspecified: Secondary | ICD-10-CM

## 2017-07-29 DIAGNOSIS — I10 Essential (primary) hypertension: Secondary | ICD-10-CM | POA: Diagnosis not present

## 2017-07-29 DIAGNOSIS — M25531 Pain in right wrist: Secondary | ICD-10-CM

## 2017-07-29 DIAGNOSIS — E7800 Pure hypercholesterolemia, unspecified: Secondary | ICD-10-CM | POA: Diagnosis not present

## 2017-07-29 DIAGNOSIS — Z7189 Other specified counseling: Secondary | ICD-10-CM

## 2017-07-29 DIAGNOSIS — R739 Hyperglycemia, unspecified: Secondary | ICD-10-CM | POA: Diagnosis not present

## 2017-07-29 DIAGNOSIS — I429 Cardiomyopathy, unspecified: Secondary | ICD-10-CM

## 2017-07-29 DIAGNOSIS — K922 Gastrointestinal hemorrhage, unspecified: Secondary | ICD-10-CM

## 2017-07-29 DIAGNOSIS — K219 Gastro-esophageal reflux disease without esophagitis: Secondary | ICD-10-CM

## 2017-07-29 DIAGNOSIS — Z0000 Encounter for general adult medical examination without abnormal findings: Secondary | ICD-10-CM

## 2017-07-29 MED ORDER — OMEPRAZOLE 20 MG PO CPDR: 20.0000 mg | DELAYED_RELEASE_CAPSULE | Freq: Two times a day (BID) | ORAL | 3 refills | Status: DC

## 2017-07-29 MED ORDER — CARVEDILOL 3.125 MG PO TABS: 3.1250 mg | ORAL_TABLET | Freq: Two times a day (BID) | ORAL | 3 refills | Status: DC

## 2017-07-29 MED ORDER — ATORVASTATIN CALCIUM 20 MG PO TABS: 20.0000 mg | ORAL_TABLET | Freq: Every morning | ORAL | 3 refills | Status: DC

## 2017-07-29 NOTE — Patient Instructions (Addendum)
Call your pharmacy and check about your losartan to make sure it wasn't recalled.   Please call urology about a follow up appointment.  [phone].   I'll await the notes from Dr. [name].   Go to the lab on the way out.  We'll contact you with your xray report. Keep moving- work on [employer] and get a little more exercise.   Take care.  Glad to see you.

## 2017-07-29 NOTE — Progress Notes (Signed)
Advance directive- wife designated if patient were incapacitated.  Vaccines up to date.   Defer PSA to urology, patient agrees.  Taking flomax daily.   Colonoscopy 2018 See AVS.  Hearing - failed.  Declined hearing aids.    R hand pain. Still sore at proximal R 1st MC.  More pain with more activity.  Using brace as tolerated with some relief but it can still get irritated.  No trauma.  No locking at the MCP or other joints.   H/o GIB.  Still on PPI.  No blood in stool.  No abd pain.   Off ASA given h/o GIB.    Elevated Cholesterol: Using medications without problems:yes Muscle aches: no Diet compliance: good, d/w pt.   Exercise: encouraged Labs d/w pt.    R knee pain worse in the winter, better in the spring.  He is able to tolerate as is.  Hypertension:    Using medication without problems or lightheadedness: yes Chest pain with exertion:no Edema:no Short of breath:no Other issues: d/w pt about recall with some losartan rxs and he can check with pharmacy about that.    Meds, vitals, and allergies reviewed.   PMH and SH reviewed  ROS: Per HPI unless specifically indicated in ROS section   GEN: nad, alert and oriented HEENT: mucous membranes moist NECK: supple w/o LA CV: rrr. PULM: ctab, no inc wob ABD: soft, +bs EXT: no edema SKIN: no acute rash R hand ttp at proximal R 1st MC without bruising or redness.  Range of motion still intact otherwise.  Distally neurovascularly intact.

## 2017-07-31 DIAGNOSIS — Z0000 Encounter for general adult medical examination without abnormal findings: Secondary | ICD-10-CM | POA: Insufficient documentation

## 2017-07-31 NOTE — Assessment & Plan Note (Signed)
d/w pt about recall with some losartan rxs and he can check with pharmacy about that.   Continue work on diet and exercise.  Labs discussed with patient.  He agrees.  No change in meds.

## 2017-07-31 NOTE — Assessment & Plan Note (Signed)
Advance directive- wife designated if patient were incapacitated.  

## 2017-07-31 NOTE — Assessment & Plan Note (Signed)
Continue statin.  Continue work on diet and exercise.  Labs discussed with patient.  He agrees. 

## 2017-07-31 NOTE — Assessment & Plan Note (Signed)
Still on PPI.  No blood in stool.  No abd pain.   Off ASA given h/o GIB.  Doing well.  Labs discussed with patient.  Continue as is.  Hemoglobin is stable.

## 2017-07-31 NOTE — Assessment & Plan Note (Signed)
Discussed with patient about options.  Reasonable to check plain films today.  We may end up needing to get the patient to see sports medicine clinic.

## 2017-07-31 NOTE — Assessment & Plan Note (Signed)
Advance directive- wife designated if patient were incapacitated.  Vaccines up to date.   Defer PSA to urology, patient agrees.  Taking flomax daily.   Colonoscopy 2018 See AVS.  Hearing - failed.  Declined hearing aids.

## 2017-07-31 NOTE — Assessment & Plan Note (Signed)
Mild hyperglycemia but not diabetic based on A1c.  Continue work on diet and exercise.  Labs discussed with patient.

## 2017-08-03 ENCOUNTER — Ambulatory Visit (INDEPENDENT_AMBULATORY_CARE_PROVIDER_SITE_OTHER): Payer: Medicare Other | Admitting: Family Medicine

## 2017-08-03 ENCOUNTER — Other Ambulatory Visit: Payer: Self-pay

## 2017-08-03 ENCOUNTER — Encounter: Payer: Self-pay | Admitting: Family Medicine

## 2017-08-03 VITALS — BP 110/80 | HR 73 | Temp 97.5°F | Ht 70.5 in | Wt 217.5 lb

## 2017-08-03 DIAGNOSIS — I259 Chronic ischemic heart disease, unspecified: Secondary | ICD-10-CM | POA: Diagnosis not present

## 2017-08-03 DIAGNOSIS — M19031 Primary osteoarthritis, right wrist: Secondary | ICD-10-CM

## 2017-08-03 DIAGNOSIS — G8929 Other chronic pain: Secondary | ICD-10-CM

## 2017-08-03 DIAGNOSIS — M25531 Pain in right wrist: Secondary | ICD-10-CM

## 2017-08-03 MED ORDER — TRAMADOL HCL 50 MG PO TABS: 50.0000 mg | ORAL_TABLET | Freq: Four times a day (QID) | ORAL | 1 refills | Status: AC | PRN

## 2017-08-03 NOTE — Progress Notes (Signed)
Dr. Frederico Hamman T. Lakima Dona, MD, [facility] [place], [address] Phone: [phone] Fax: [phone]  08/03/2017  Patient: Paul Fry, MRN: [id number], DOB: 1945/10/01, 72 y.o.  Primary Physician:  [name], MD   Chief Complaint  Patient presents with  . Wrist Pain    Right x 1 year   Subjective:   Paul Fry is a 72 y.o. very pleasant male patient who presents with the following:  R wrist pain: pain in the true wrist. No trauma or injury. No swelling. Some restriction in the ROM.  From a pain standpoint, he is really not limited by this at all today.  It fluctuates quite a bit, and sometimes it will hurt and other times it is perfectly fine.  Generally the pain has been lasting all along.  Does have a cockup wrist splint that he has been using to help rested every so often.  He is not had any specific trauma or injury or any prior fractures or surgery on the affected wrist.  Past Medical History, Surgical History, Social History, Family History, Problem List, Medications, and Allergies have been reviewed and updated if relevant.  Patient Active Problem List   Diagnosis Date Noted  . Health care maintenance 07/31/2017  . Dysfunction of eustachian tube 02/04/2017  . BPH (benign prostatic hyperplasia) 12/23/2016  . Lower GI bleed 12/22/2016  . Right wrist pain 07/28/2016  . Lower urinary tract symptoms (LUTS) 11/19/2014  . Cough 10/23/2014  . Advance care planning 07/16/2014  . Hyperglycemia 07/16/2014  . HTN (hypertension) 07/09/2013  . Paresthesia 12/25/2012  . Cardiomyopathy ([place]) 09/26/2012  . ED (erectile dysfunction) 05/26/2012  . DOE (dyspnea on exertion) 06/18/2011  . Plantar fascia syndrome 05/26/2011  . Medicare annual wellness visit, subsequent 05/26/2011  . Hypercholesteremia 03/03/2011  . CAD, UNSPECIFIED SITE 10/14/2008  . GERD 10/14/2008  . DIVERTICULOSIS OF COLON 04/03/2008     Past Medical History:  Diagnosis Date  . Allergy    SEASONAL  . Anemia   . Arthritis    KNEES  . BPH (benign prostatic hyperplasia)   . Diverticulosis of colon   . Elevated PSA    1.22 on 01-20-2016  . Feeling of incomplete bladder emptying   . GERD (gastroesophageal reflux disease)   . Hiatal hernia   . History of adenomatous polyp of colon    2014 tubular adenoma  . History of chronic gastritis   . History of Helicobacter pylori infection    07/ 2016  . History of lower GI bleeding    07/ 2010 and 12/ 2011  diverticular bleed both times  . Hyperlipidemia   . Hypertension   . Lower urinary tract symptoms (LUTS)   . Nonischemic cardiomyopathy ([place])   . Plantar fascia syndrome   . Strains to urinate    INTERMITTANT  . Type 2 diabetes, diet controlled ([place])    ON BORDER LINE,NEVER TOOK MEDICATION.  [name] Wears glasses     Past Surgical History:  Procedure Laterality Date  . APPENDECTOMY  teenager  . CARDIAC CATHETERIZATION  09-10-2008   dr dalton mclean   mild non-obstructive CAD;  30-40% mRCA, 20%pLAD,  ef 55%  . CARDIOVASCULAR STRESS TEST  07/29/2011   Low risk nuclear study w/ no ischemia or infarct/  LVEF 47% and  apical hypokinesis (08-07-2012 cardiac MRI -- EF 49% w/ no hyperenhancement distal septal and apical hypokinesis, mild LAE, mild RVE, mild AR with  mild dilated sinus 67mm)  . CATARACT EXTRACTION W/ INTRAOCULAR LENS  IMPLANT, BILATERAL  2016  . COLONOSCOPY  last one 12-21-2012  . CYSTOSCOPY WITH INSERTION OF UROLIFT N/A 06/17/2016   Procedure: CYSTOSCOPY WITH INSERTION OF UROLIFT;  Surgeon: [name], MD;  Location: [facility];  Service: Urology;  Laterality: N/A;  . ESOPHAGOGASTRODUODENOSCOPY  last one 11-22-2014  . TRANSTHORACIC ECHOCARDIOGRAM  09/17/2014   ef 50-55%/  trivial AR, MR and PR/  mild LAE and RAE/  mild TR    Social History   Socioeconomic History  . Marital status: Married    Spouse name: Not on file  . Number  of children: 0  . Years of education: Not on file  . Highest education level: Not on file  Occupational History  . Occupation: truck Geophysicist/field seismologist- retired    Fish farm manager: RETIRED  Social Needs  . Financial resource strain: Not on file  . Food insecurity:    Worry: Not on file    Inability: Not on file  . Transportation needs:    Medical: Not on file    Non-medical: Not on file  Tobacco Use  . Smoking status: Never Smoker  . Smokeless tobacco: Never Used  Substance and Sexual Activity  . Alcohol use: Yes    Comment: rarely drinks beer  . Drug use: No  . Sexual activity: Not on file  Lifestyle  . Physical activity:    Days per week: Not on file    Minutes per session: Not on file  . Stress: Not on file  Relationships  . Social connections:    Talks on phone: Not on file    Gets together: Not on file    Attends religious service: Not on file    Active member of club or organization: Not on file    Attends meetings of clubs or organizations: Not on file    Relationship status: Not on file  . Intimate partner violence:    Fear of current or ex partner: Not on file    Emotionally abused: Not on file    Physically abused: Not on file    Forced sexual activity: Not on file  Other Topics Concern  . Not on file  Social History Narrative   Married 2003, no children.    Retired Administrator, gets regular exercise- walking.    Step daughter is MD in [place]    Family History  Problem Relation Age of Onset  . Colon polyps Brother   . Colon cancer Mother 9  . Lung cancer Brother   . Heart disease Sister        MI, pace maker  . Kidney disease Sister        renal failure, nephrectomy- not cancer  . Hypertension Sister   . Hypertension Sister   . Diabetes Neg Hx        DM  . Depression Neg Hx   . Stroke Neg Hx   . Alcohol abuse Neg Hx        also no drug abuse   . Prostate cancer Neg Hx     Allergies  Allergen Reactions  . Aspirin Other (See Comments)    History of GI  bleed    Medication list reviewed and updated in full in [facility].  GEN: No fevers, chills. Nontoxic. Primarily MSK c/o today. MSK: Detailed in the HPI GI: tolerating PO intake without difficulty Neuro: No numbness, parasthesias, or tingling associated. Otherwise the pertinent positives  of the ROS are noted above.   Objective:   BP 110/80   Pulse 73   Temp (!) 97.5 F (36.4 C) (Oral)   Ht 5' 10.5" (1.791 m)   Wt 217 lb 8 oz (98.7 kg)   BMI 30.77 kg/m    GEN: WDWN, NAD, Non-toxic, Alert & Oriented x 3 HEENT: Atraumatic, Normocephalic.  Ears and Nose: No external deformity. EXTR: No clubbing/cyanosis/edema NEURO: Normal gait.  PSYCH: Normally interactive. Conversant. Not depressed or anxious appearing.  Calm demeanor.   Hand: R Ecchymosis or edema: neg ROM wrist/hand/digits/elbow: approx 35% loss of motion globally at the wrist Carpals, MCP's, digits: mild fullness and tenderness at carpal joint / true wrist Distal Ulna and Radius: NT Supination lift test: neg Ecchymosis or edema: neg Cysts/nodules: neg Finkelstein's test: neg Snuffbox tenderness: neg Scaphoid tubercle: NT Hook of Hamate: NT Resisted supination: NT Full composite fist Grip, all digits: 5/5 str No tenosynovitis Axial load test: neg Phalen's: neg Tinel's: neg Atrophy: neg  Hand sensation: intact   Radiology: Dg Wrist Complete Right  Result Date: 07/29/2017 CLINICAL DATA:  Lateral right wrist pain for the past year. No known injury. EXAM: RIGHT WRIST - COMPLETE 3+ VIEW COMPARISON:  None. FINDINGS: Normal appearing bones.  Extensive arterial calcifications. IMPRESSION: 1. Normal appearing bones. 2. Extensive atheromatous arterial calcifications. Electronically Signed   By: Claudie Revering M.D.   On: 07/29/2017 15:31    Assessment and Plan:   Localized primary osteoarthritis of right wrist  Chronic pain of right wrist  Some reasonable loss of motion at the right wrist with pain in the  carpal region.  Typical for early arthritis at 46 of the carpal bones.  Mild effusion also. Calcification in arteries should not influence this.   At this point, I think continuing with as needed use of cockup wrist splint, as well as some topicals and as needed tramadol is reasonable.  At any point in the future, if his pain gets more severe, it would be easy to inject his wrist with corticosteroid.  He and I both agree that it is really not that bad enough right now to do this.  I appreciate the opportunity to evaluate Paul Fry. If you have any question regarding his care or prognosis, do not hesitate to ask.   Follow-up: prn only  Meds ordered this encounter  Medications  . traMADol (ULTRAM) 50 MG tablet    Sig: Take 1 tablet (50 mg total) by mouth every 6 (six) hours as needed for up to 7 days.    Dispense:  20 tablet    Refill:  1   Signed,  Normal Recinos T. Adrien Shankar, MD   Allergies as of 08/03/2017      Reactions   Aspirin Other (See Comments)   History of GI bleed      Medication List        Accurate as of 08/03/17 11:59 PM. Always use your most recent med list.          ascorbic acid 1000 MG tablet Commonly known as:  VITAMIN C Take 1,000 mg by mouth daily.   atorvastatin 20 MG tablet Commonly known as:  LIPITOR Take 1 tablet (20 mg total) by mouth every morning.   carvedilol 3.125 MG tablet Commonly known as:  COREG Take 1 tablet (3.125 mg total) by mouth 2 (two) times daily.   cetirizine 10 MG tablet Commonly known as:  ZYRTEC Take 10 mg by mouth daily as needed for allergies.  fish oil-omega-3 fatty acids 1000 MG capsule Take 1 g by mouth daily.   fluticasone 50 MCG/ACT nasal spray Commonly known as:  FLONASE Place 2 sprays into both nostrils daily as needed for allergies or rhinitis.   losartan 25 MG tablet Commonly known as:  COZAAR Take 1 tablet (25 mg total) by mouth daily.   multivitamin tablet Take 1 tablet by mouth daily.   omeprazole 20 MG  capsule Commonly known as:  PRILOSEC Take 1 capsule (20 mg total) by mouth 2 (two) times daily.   tamsulosin 0.4 MG Caps capsule Commonly known as:  FLOMAX TAKE 2 CAPSULES EVERY DAY   traMADol 50 MG tablet Commonly known as:  ULTRAM Take 1 tablet (50 mg total) by mouth every 6 (six) hours as needed for up to 7 days.   TUMS 500 MG chewable tablet Generic drug:  calcium carbonate Chew 1 tablet by mouth as needed for indigestion. Reported on 07/17/2015

## 2017-08-22 ENCOUNTER — Encounter: Payer: Self-pay | Admitting: Cardiology

## 2017-08-22 ENCOUNTER — Ambulatory Visit (INDEPENDENT_AMBULATORY_CARE_PROVIDER_SITE_OTHER): Payer: Medicare Other | Admitting: Cardiology

## 2017-08-22 VITALS — BP 104/70 | HR 72 | Ht 69.5 in | Wt 218.8 lb

## 2017-08-22 DIAGNOSIS — I428 Other cardiomyopathies: Secondary | ICD-10-CM | POA: Diagnosis not present

## 2017-08-22 DIAGNOSIS — I10 Essential (primary) hypertension: Secondary | ICD-10-CM | POA: Diagnosis not present

## 2017-08-22 DIAGNOSIS — E119 Type 2 diabetes mellitus without complications: Secondary | ICD-10-CM

## 2017-08-22 DIAGNOSIS — E7800 Pure hypercholesterolemia, unspecified: Secondary | ICD-10-CM

## 2017-08-22 DIAGNOSIS — I259 Chronic ischemic heart disease, unspecified: Secondary | ICD-10-CM | POA: Diagnosis not present

## 2017-08-22 NOTE — Patient Instructions (Signed)

## 2017-08-22 NOTE — Progress Notes (Signed)
Cardiology Office Note:    Date:  08/22/2017   ID:  Paul Fry, DOB December 24, 1945, MRN [id number]  PCP:  [name], MD  Cardiologist:  Truitt Merle, NP   Referring MD: [name], MD    History of Present Illness:    Paul Fry is a 72 y.o. male with nonischemic cardiomyopathy, prior cardiac catheterization in May 2010 with mild nonobstructive CAD.  EF was 47% during a prior nuclear stress test in 2013.  Echocardiogram was then repeated and showed EF of 55%.  Cardiac MRI was done in April 2014 showing EF of 49% with distal septal and apical hypokinesis, no delayed hyperenhancement.  Most 2016 once again showed low normal EF of 50-55%.  In 2017 doing well, had a prior GI bleed, not on aspirin.  Angiotensin receptor blocker was restarted for his nonischemic cardiomyopathy and renal protection.  Feels well.  No syncope bleeding orthopnea PND.  He is feeling quite well.  Per Dr. [name] prior note:  PMH: 1. GERD 2. Plantar fasciitis 3. Hyperlipidemia 4. Diverticulosis with diverticular bleeding x 2. Stopped ASA. 5. Left heart cath 5/10: EF 55%, 30-40% mid RCA.  6. Cardiomyopathy: Mild. ETT-Myoview (3/13) with EF 47%, no ischemia or infarction. Echo (3/13) with EF 50-55%, mild LVH, mild MR, mild AI. 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  - Echo (2016) with EF 50-55%, mild biatrial enlargement.  7. Type II diabetes   Past Medical History:  Diagnosis Date  . Allergy    SEASONAL  . Anemia   . Arthritis    KNEES  . BPH (benign prostatic hyperplasia)   . Diverticulosis of colon   . Elevated PSA    1.22 on 01-20-2016  . Feeling of incomplete bladder emptying   . GERD (gastroesophageal reflux disease)   . Hiatal hernia   . History of adenomatous polyp of colon    2014 tubular adenoma  . History of chronic gastritis   . History of Helicobacter pylori infection    07/ 2016  . History of lower GI bleeding    07/ 2010 and 12/ 2011  diverticular bleed both times  . Hyperlipidemia   . Hypertension   . Lower urinary tract symptoms (LUTS)   . Nonischemic cardiomyopathy ([place])   . Plantar fascia syndrome   . Strains to urinate    INTERMITTANT  . Type 2 diabetes, diet controlled ([place])    ON BORDER LINE,NEVER TOOK MEDICATION.  [name] Wears glasses     Past Surgical History:  Procedure Laterality Date  . APPENDECTOMY  teenager  . CARDIAC CATHETERIZATION  09-10-2008   dr dalton mclean   mild non-obstructive CAD;  30-40% mRCA, 20%pLAD,  ef 55%  . CARDIOVASCULAR STRESS TEST  07/29/2011   Low risk nuclear study w/ no ischemia or infarct/  LVEF 47% and  apical hypokinesis (08-07-2012 cardiac MRI -- EF 49% w/ no hyperenhancement distal septal and apical hypokinesis, mild LAE, mild RVE, mild AR with mild dilated sinus 57mm)  . CATARACT EXTRACTION W/ INTRAOCULAR LENS  IMPLANT, BILATERAL  2016  . COLONOSCOPY  last one 12-21-2012  . CYSTOSCOPY WITH INSERTION OF UROLIFT N/A 06/17/2016   Procedure: CYSTOSCOPY WITH INSERTION OF UROLIFT;  Surgeon: [name], MD;  Location: [facility];  Service: Urology;  Laterality: N/A;  . ESOPHAGOGASTRODUODENOSCOPY  last one 11-22-2014  . TRANSTHORACIC ECHOCARDIOGRAM  09/17/2014   ef 50-55%/  trivial AR, MR  and PR/  mild LAE and RAE/  mild TR    Current Medications: Current Meds  Medication Sig  . ascorbic acid (VITAMIN C) 1000 MG tablet Take 1,000 mg by mouth daily.  [name] atorvastatin (LIPITOR) 20 MG tablet Take 1 tablet (20 mg total) by mouth every morning.  . calcium carbonate (TUMS) 500 MG chewable tablet Chew 1 tablet by mouth as needed for indigestion. Reported on 07/17/2015  . carvedilol (COREG) 3.125 MG tablet Take 1 tablet (3.125 mg total) by mouth 2 (two) times daily.  . cetirizine (ZYRTEC) 10 MG tablet Take 10 mg by mouth daily as needed for allergies.   . fish oil-omega-3 fatty acids 1000 MG capsule  Take 1 g by mouth daily.  . fluticasone (FLONASE) 50 MCG/ACT nasal spray Place 2 sprays into both nostrils daily as needed for allergies or rhinitis.  . Multiple Vitamin (MULTIVITAMIN) tablet Take 1 tablet by mouth daily.  [name] omeprazole (PRILOSEC) 20 MG capsule Take 1 capsule (20 mg total) by mouth 2 (two) times daily.  . tamsulosin (FLOMAX) 0.4 MG CAPS capsule TAKE 2 CAPSULES EVERY DAY     Allergies:   Aspirin   Social History   Socioeconomic History  . Marital status: Married    Spouse name: Not on file  . Number of children: 0  . Years of education: Not on file  . Highest education level: Not on file  Occupational History  . Occupation: truck Geophysicist/field seismologist- retired    Fish farm manager: RETIRED  Social Needs  . Financial resource strain: Not on file  . Food insecurity:    Worry: Not on file    Inability: Not on file  . Transportation needs:    Medical: Not on file    Non-medical: Not on file  Tobacco Use  . Smoking status: Never Smoker  . Smokeless tobacco: Never Used  Substance and Sexual Activity  . Alcohol use: Yes    Comment: rarely drinks beer  . Drug use: No  . Sexual activity: Not on file  Lifestyle  . Physical activity:    Days per week: Not on file    Minutes per session: Not on file  . Stress: Not on file  Relationships  . Social connections:    Talks on phone: Not on file    Gets together: Not on file    Attends religious service: Not on file    Active member of club or organization: Not on file    Attends meetings of clubs or organizations: Not on file    Relationship status: Not on file  Other Topics Concern  . Not on file  Social History Narrative   Married 2003, no children.    Retired Administrator, gets regular exercise- walking.    Step daughter is MD in [place]     Family History: The patient's family history includes Colon cancer (age of onset: 17) in his mother; Colon polyps in his brother; Heart disease in his sister; Hypertension in his sister and  sister; Kidney disease in his sister; Lung cancer in his brother. There is no history of Diabetes, Depression, Stroke, Alcohol abuse, or Prostate cancer.  ROS:   Please see the history of present illness.     All other systems reviewed and are negative.  EKGs/Labs/Other Studies Reviewed:    The following studies were reviewed today: Prior MRI echo EKG clinic notes reviewed  EKG: Prior EKG unremarkable  Recent Labs: 07/22/2017: ALT 26; BUN 11; Creatinine, Ser 0.87; Hemoglobin 15.3; Platelets  318.0; Potassium 4.1; Sodium 139  Recent Lipid Panel    Component Value Date/Time   CHOL 130 07/22/2017 1008   TRIG 117.0 07/22/2017 1008   HDL 30.80 (L) 07/22/2017 1008   CHOLHDL 4 07/22/2017 1008   VLDL 23.4 07/22/2017 1008   LDLCALC 76 07/22/2017 1008    Physical Exam:    VS:  BP 104/70   Pulse 72   Ht 5' 9.5" (1.765 m)   Wt 218 lb 12.8 oz (99.2 kg)   BMI 31.85 kg/m     Wt Readings from Last 3 Encounters:  08/22/17 218 lb 12.8 oz (99.2 kg)  08/03/17 217 lb 8 oz (98.7 kg)  07/29/17 212 lb 8 oz (96.4 kg)     GEN:  Well nourished, well developed in no acute distress HEENT: Normal NECK: No JVD; No carotid bruits LYMPHATICS: No lymphadenopathy CARDIAC: RRR, no murmurs, rubs, gallops RESPIRATORY:  Clear to auscultation without rales, wheezing or rhonchi  ABDOMEN: Soft, non-tender, non-distended MUSCULOSKELETAL:  No edema; No deformity  SKIN: Warm and dry NEUROLOGIC:  Alert and oriented x 3 PSYCHIATRIC:  Normal affect   ASSESSMENT:    1. NICM (nonischemic cardiomyopathy) ([place])   2. Hypercholesteremia   3. Diabetes mellitus with coincident hypertension ([place])    PLAN:    In order of problems listed above:  Nonischemic cardiomyopathy -Resolution of ejection fraction at last check in 2016.  Possible mild non-compaction at apex.  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  Continue to do well.  No changes in medications.  Blood pressure slightly low today.  No  symptoms.  Hyperlipidemia -Continue with atorvastatin 20 mg.  Had mild nonobstructive coronary artery disease on a prior cardiac cath.  LDL 76, ALT 26  Diabetes with hypertension -Continue to follow primary physician.  Doing well.  Prior GI bleed -Stay off of aspirin.  1 year follow-up.   Medication Adjustments/Labs and Tests Ordered: Current medicines are reviewed at length with the patient today.  Concerns regarding medicines are outlined above.  No orders of the defined types were placed in this encounter.  No orders of the defined types were placed in this encounter.  Patient Instructions  Medication Instructions:  The current medical regimen is effective;  continue present plan and medications.  Follow-Up: Follow up in 1 year with Dr. [name].  You will receive a letter in the mail 2 months before you are due.  Please call us when you receive this letter to schedule your follow up appointment.  If you need a refill on your cardiac medications before your next appointment, please call your pharmacy.  Thank you for choosing [facility]!!       Signed, Candee Furbish, MD  08/22/2017 10:40 AM    [place]

## 2017-09-09 DIAGNOSIS — H524 Presbyopia: Secondary | ICD-10-CM | POA: Diagnosis not present

## 2017-09-09 DIAGNOSIS — Z961 Presence of intraocular lens: Secondary | ICD-10-CM | POA: Diagnosis not present

## 2017-10-04 ENCOUNTER — Encounter: Payer: Self-pay | Admitting: Internal Medicine

## 2017-10-04 ENCOUNTER — Ambulatory Visit (INDEPENDENT_AMBULATORY_CARE_PROVIDER_SITE_OTHER): Payer: Medicare Other | Admitting: Internal Medicine

## 2017-10-04 VITALS — BP 116/70 | HR 73 | Temp 97.7°F | Wt 210.0 lb

## 2017-10-04 DIAGNOSIS — R110 Nausea: Secondary | ICD-10-CM | POA: Diagnosis not present

## 2017-10-04 DIAGNOSIS — I259 Chronic ischemic heart disease, unspecified: Secondary | ICD-10-CM

## 2017-10-04 DIAGNOSIS — R6883 Chills (without fever): Secondary | ICD-10-CM | POA: Diagnosis not present

## 2017-10-04 NOTE — Progress Notes (Signed)
Subjective:    Patient ID: Paul Fry, male    DOB: 07-07-1945, 72 y.o.   MRN: [id number]  HPI  Pt presents to the clinic today with c/o decreased appetite, chills and nausea. He reports this started 3 days ago. He did have some numbness in his fingertips bilaterally on Saturday, but that resolved on Sunday. He denies runny nose, nasal congestion, ear pain, sore throat or cough. He denies vomiting, constipation, diarrhea or blood in his stool. He denies fever, body aches or rapid weight loss. He denies rashes, dizziness, chest pain or syncopal episodes. He denies any of these symptoms today. He denies recent changes in medications, diet and activity level.  Review of Systems      Past Medical History:  Diagnosis Date  . Allergy    SEASONAL  . Anemia   . Arthritis    KNEES  . BPH (benign prostatic hyperplasia)   . Diverticulosis of colon   . Elevated PSA    1.22 on 01-20-2016  . Feeling of incomplete bladder emptying   . GERD (gastroesophageal reflux disease)   . Hiatal hernia   . History of adenomatous polyp of colon    2014 tubular adenoma  . History of chronic gastritis   . History of Helicobacter pylori infection    07/ 2016  . History of lower GI bleeding    07/ 2010 and 12/ 2011  diverticular bleed both times  . Hyperlipidemia   . Hypertension   . Lower urinary tract symptoms (LUTS)   . Nonischemic cardiomyopathy ([place])   . Plantar fascia syndrome   . Strains to urinate    INTERMITTANT  . Type 2 diabetes, diet controlled ([place])    ON BORDER LINE,NEVER TOOK MEDICATION.  [name] Wears glasses     Current Outpatient Medications  Medication Sig Dispense Refill  . ascorbic acid (VITAMIN C) 1000 MG tablet Take 1,000 mg by mouth daily.    [name] atorvastatin (LIPITOR) 20 MG tablet Take 1 tablet (20 mg total) by mouth every morning. 90 tablet 3  . calcium carbonate (TUMS) 500 MG chewable tablet Chew 1 tablet by mouth as needed for indigestion. Reported on 07/17/2015    .  carvedilol (COREG) 3.125 MG tablet Take 1 tablet (3.125 mg total) by mouth 2 (two) times daily. 180 tablet 3  . cetirizine (ZYRTEC) 10 MG tablet Take 10 mg by mouth daily as needed for allergies.     . fish oil-omega-3 fatty acids 1000 MG capsule Take 1 g by mouth daily.    . fluticasone (FLONASE) 50 MCG/ACT nasal spray Place 2 sprays into both nostrils daily as needed for allergies or rhinitis.    . Multiple Vitamin (MULTIVITAMIN) tablet Take 1 tablet by mouth daily.    [name] omeprazole (PRILOSEC) 20 MG capsule Take 1 capsule (20 mg total) by mouth 2 (two) times daily. 180 capsule 3  . tamsulosin (FLOMAX) 0.4 MG CAPS capsule TAKE 2 CAPSULES EVERY DAY 180 capsule 3  . losartan (COZAAR) 25 MG tablet Take 1 tablet (25 mg total) by mouth daily. 90 tablet 3   No current facility-administered medications for this visit.     Allergies  Allergen Reactions  . Aspirin Other (See Comments)    History of GI bleed    Family History  Problem Relation Age of Onset  . Colon polyps Brother   . Colon cancer Mother 16  . Lung cancer Brother   . Heart disease Sister  MI, pace maker  . Kidney disease Sister        renal failure, nephrectomy- not cancer  . Hypertension Sister   . Hypertension Sister   . Diabetes Neg Hx        DM  . Depression Neg Hx   . Stroke Neg Hx   . Alcohol abuse Neg Hx        also no drug abuse   . Prostate cancer Neg Hx     Social History   Socioeconomic History  . Marital status: Married    Spouse name: Not on file  . Number of children: 0  . Years of education: Not on file  . Highest education level: Not on file  Occupational History  . Occupation: truck Geophysicist/field seismologist- retired    Fish farm manager: RETIRED  Social Needs  . Financial resource strain: Not on file  . Food insecurity:    Worry: Not on file    Inability: Not on file  . Transportation needs:    Medical: Not on file    Non-medical: Not on file  Tobacco Use  . Smoking status: Never Smoker  . Smokeless  tobacco: Never Used  Substance and Sexual Activity  . Alcohol use: Yes    Comment: rarely drinks beer  . Drug use: No  . Sexual activity: Not on file  Lifestyle  . Physical activity:    Days per week: Not on file    Minutes per session: Not on file  . Stress: Not on file  Relationships  . Social connections:    Talks on phone: Not on file    Gets together: Not on file    Attends religious service: Not on file    Active member of club or organization: Not on file    Attends meetings of clubs or organizations: Not on file    Relationship status: Not on file  . Intimate partner violence:    Fear of current or ex partner: Not on file    Emotionally abused: Not on file    Physically abused: Not on file    Forced sexual activity: Not on file  Other Topics Concern  . Not on file  Social History Narrative   Married 2003, no children.    Retired Administrator, gets regular exercise- walking.    Step daughter is MD in [place]     Constitutional: Pt reports chills. Denies fever, malaise, fatigue, headache or abrupt weight changes.  HEENT: Denies eye pain, eye redness, ear pain, ringing in the ears, wax buildup, runny nose, nasal congestion, bloody nose, or sore throat. Respiratory: Denies difficulty breathing, shortness of breath, cough or sputum production.   Cardiovascular: Denies chest pain, chest tightness, palpitations or swelling in the hands or feet.  Gastrointestinal: Pt reports nausea. Denies abdominal pain, bloating, constipation, diarrhea or blood in the stool.  GU: Denies urgency, frequency, pain with urination, burning sensation, blood in urine, odor or discharge. Musculoskeletal: Denies decrease in range of motion, difficulty with gait, muscle pain or joint pain and swelling.  Skin: Denies redness, rashes, lesions or ulcercations.  Neurological: Denies dizziness, difficulty with memory, difficulty with speech or problems with balance and coordination.    No other  specific complaints in a complete review of systems (except as listed in HPI above).  Objective:   Physical Exam  BP 116/70   Pulse 73   Temp 97.7 F (36.5 C) (Oral)   Wt 210 lb (95.3 kg)   SpO2 97%   BMI  30.57 kg/m  Wt Readings from Last 3 Encounters:  10/04/17 210 lb (95.3 kg)  08/22/17 218 lb 12.8 oz (99.2 kg)  08/03/17 217 lb 8 oz (98.7 kg)    General: Appears his stated age, well developed, well nourished in NAD. Skin: Warm, dry and intact. No rashes noted. HEENT: Ears: Tm's gray and intact, normal light reflex;Throat/Mouth: Teeth present, mucosa pink and moist, no exudate, lesions or ulcerations noted.  Neck:  Neck supple, trachea midline. No masses, lumps or thyromegaly present.  Cardiovascular: Normal rate and rhythm. S1,S2 noted.  No murmur, rubs or gallops noted.  Pulmonary/Chest: Normal effort and positive vesicular breath sounds. No respiratory distress. No wheezes, rales or ronchi noted.  Abdomen: Soft and nontender. Normal bowel sounds. No distention or masses noted.  Neurological: Alert and oriented.  Psychiatric: Mood and affect normal. Behavior is normal. Judgment and thought content normal.    BMET    Component Value Date/Time   NA 139 07/22/2017 1008   K 4.1 07/22/2017 1008   CL 103 07/22/2017 1008   CO2 31 07/22/2017 1008   GLUCOSE 119 (H) 07/22/2017 1008   BUN 11 07/22/2017 1008   CREATININE 0.87 07/22/2017 1008   CREATININE 1.00 12/31/2016 1610   CALCIUM 9.3 07/22/2017 1008   GFRNONAA >60 12/24/2016 0358   GFRAA >60 12/24/2016 0358    Lipid Panel     Component Value Date/Time   CHOL 130 07/22/2017 1008   TRIG 117.0 07/22/2017 1008   HDL 30.80 (L) 07/22/2017 1008   CHOLHDL 4 07/22/2017 1008   VLDL 23.4 07/22/2017 1008   LDLCALC 76 07/22/2017 1008    CBC    Component Value Date/Time   WBC 5.9 07/22/2017 1008   RBC 5.13 07/22/2017 1008   HGB 15.3 07/22/2017 1008   HCT 45.6 07/22/2017 1008   PLT 318.0 07/22/2017 1008   MCV 88.9  07/22/2017 1008   MCH 24.3 (L) 12/31/2016 1610   MCHC 33.5 07/22/2017 1008   RDW 15.0 07/22/2017 1008   LYMPHSABS 2.2 07/22/2017 1008   MONOABS 0.4 07/22/2017 1008   EOSABS 0.1 07/22/2017 1008   BASOSABS 0.1 07/22/2017 1008    Hgb A1C Lab Results  Component Value Date   HGBA1C 6.2 07/22/2017            Assessment & Plan:   Chills, Nausea:  Symptoms resolved Offered to check CBC, CMET but he declines at this time Encouraged rest and fluids He declines RX for Zofran at this time Likely viral  Return precautions discussed Webb Silversmith, NP

## 2017-10-04 NOTE — Patient Instructions (Signed)
Viral Illness, Adult Viruses are tiny germs that can get into a person's body and cause illness. There are many different types of viruses, and they cause many types of illness. Viral illnesses can range from mild to severe. They can affect various parts of the body. Common illnesses that are caused by a virus include colds and the flu. Viral illnesses also include serious conditions such as HIV/AIDS (human immunodeficiency virus/acquired immunodeficiency syndrome). A few viruses have been linked to certain cancers. What are the causes? Many types of viruses can cause illness. Viruses invade cells in your body, multiply, and cause the infected cells to malfunction or die. When the cell dies, it releases more of the virus. When this happens, you develop symptoms of the illness, and the virus continues to spread to other cells. If the virus takes over the function of the cell, it can cause the cell to divide and grow out of control, as is the case when a virus causes cancer. Different viruses get into the body in different ways. You can get a virus by:  Swallowing food or water that is contaminated with the virus.  Breathing in droplets that have been coughed or sneezed into the air by an infected person.  Touching a surface that has been contaminated with the virus and then touching your eyes, nose, or mouth.  Being bitten by an insect or animal that carries the virus.  Having sexual contact with a person who is infected with the virus.  Being exposed to blood or fluids that contain the virus, either through an open cut or during a transfusion.  If a virus enters your body, your body's defense system (immune system) will try to fight the virus. You may be at higher risk for a viral illness if your immune system is weak. What are the signs or symptoms? Symptoms vary depending on the type of virus and the location of the cells that it invades. Common symptoms of the main types of viral illnesses  include: Cold and flu viruses  Fever.  Headache.  Sore throat.  Muscle aches.  Nasal congestion.  Cough. Digestive system (gastrointestinal) viruses  Fever.  Abdominal pain.  Nausea.  Diarrhea. Liver viruses (hepatitis)  Loss of appetite.  Tiredness.  Yellowing of the skin (jaundice). Brain and spinal cord viruses  Fever.  Headache.  Stiff neck.  Nausea and vomiting.  Confusion or sleepiness. Skin viruses  Warts.  Itching.  Rash. Sexually transmitted viruses  Discharge.  Swelling.  Redness.  Rash. How is this treated? Viruses can be difficult to treat because they live within cells. Antibiotic medicines do not treat viruses because these drugs do not get inside cells. Treatment for a viral illness may include:  Resting and drinking plenty of fluids.  Medicines to relieve symptoms. These can include over-the-counter medicine for pain and fever, medicines for cough or congestion, and medicines to relieve diarrhea.  Antiviral medicines. These drugs are available only for certain types of viruses. They may help reduce flu symptoms if taken early. There are also many antiviral medicines for hepatitis and HIV/AIDS.  Some viral illnesses can be prevented with vaccinations. A common example is the flu shot. Follow these instructions at home: Medicines   Take over-the-counter and prescription medicines only as told by your health care provider.  If you were prescribed an antiviral medicine, take it as told by your health care provider. Do not stop taking the medicine even if you start to feel better.  Be aware   of when antibiotics are needed and when they are not needed. Antibiotics do not treat viruses. If your health care provider thinks that you may have a bacterial infection as well as a viral infection, you may get an antibiotic. ? Do not ask for an antibiotic prescription if you have been diagnosed with a viral illness. That will not make your  illness go away faster. ? Frequently taking antibiotics when they are not needed can lead to antibiotic resistance. When this develops, the medicine no longer works against the bacteria that it normally fights. General instructions  Drink enough fluids to keep your urine clear or pale yellow.  Rest as much as possible.  Return to your normal activities as told by your health care provider. Ask your health care provider what activities are safe for you.  Keep all follow-up visits as told by your health care provider. This is important. How is this prevented? Take these actions to reduce your risk of viral infection:  Eat a healthy diet and get enough rest.  Wash your hands often with soap and water. This is especially important when you are in public places. If soap and water are not available, use hand sanitizer.  Avoid close contact with friends and family who have a viral illness.  If you travel to areas where viral gastrointestinal infection is common, avoid drinking water or eating raw food.  Keep your immunizations up to date. Get a flu shot every year as told by your health care provider.  Do not share toothbrushes, nail clippers, razors, or needles with other people.  Always practice safe sex.  Contact a health care provider if:  You have symptoms of a viral illness that do not go away.  Your symptoms come back after going away.  Your symptoms get worse. Get help right away if:  You have trouble breathing.  You have a severe headache or a stiff neck.  You have severe vomiting or abdominal pain. This information is not intended to replace advice given to you by your health care provider. Make sure you discuss any questions you have with your health care provider. Document Released: 09/05/2015 Document Revised: 10/08/2015 Document Reviewed: 09/05/2015 Elsevier Interactive Patient Education  2018 [employer]  

## 2017-11-19 ENCOUNTER — Other Ambulatory Visit: Payer: Self-pay | Admitting: Family Medicine

## 2017-11-29 DIAGNOSIS — H43811 Vitreous degeneration, right eye: Secondary | ICD-10-CM | POA: Diagnosis not present

## 2017-12-24 ENCOUNTER — Encounter (HOSPITAL_COMMUNITY): Payer: Self-pay | Admitting: Emergency Medicine

## 2017-12-24 ENCOUNTER — Ambulatory Visit (HOSPITAL_COMMUNITY)
Admission: EM | Admit: 2017-12-24 | Discharge: 2017-12-24 | Disposition: A | Discharge status: Home / Self Care | Payer: Medicare Other | Attending: Family Medicine | Admitting: Family Medicine

## 2017-12-24 DIAGNOSIS — R195 Other fecal abnormalities: Secondary | ICD-10-CM

## 2017-12-24 DIAGNOSIS — Z8719 Personal history of other diseases of the digestive system: Secondary | ICD-10-CM

## 2017-12-24 LAB — POCT I-STAT, CHEM 8
BUN: 12 mg/dL (ref 8–23)
Calcium, Ion: 1.18 mmol/L (ref 1.15–1.40)
Chloride: 104 mmol/L (ref 98–111)
Creatinine, Ser: 0.9 mg/dL (ref 0.61–1.24)
GLUCOSE: 162 mg/dL — AB (ref 70–99)
HCT: 45 % (ref 39.0–52.0)
HEMOGLOBIN: 15.3 g/dL (ref 13.0–17.0)
POTASSIUM: 3.9 mmol/L (ref 3.5–5.1)
Sodium: 140 mmol/L (ref 135–145)
TCO2: 26 mmol/L (ref 22–32)

## 2017-12-24 NOTE — ED Provider Notes (Signed)
[facility]   [id number] 12/24/17 Arrival Time: 3009  ASSESSMENT & PLAN:  1. Dark stools   H/O GI bleeds.  Vitals stable. Hg 15.3  Follow-up [place].   Specialty:  Emergency Medicine Why:  If you notice black stools returning or feel lightheaded/dizzy. Contact information: [address] [id number] [place] [phone]       Schedule an appointment as soon as possible for a visit  with Ladene Artist, MD.   Specialty:  Gastroenterology Contact information: [address] [place] [address] [phone]          He agrees to proceed to the ED if black stools return or he feels any new symptoms.  Reviewed expectations re: course of current medical issues. Questions answered. Outlined signs and symptoms indicating need for more acute intervention. Patient verbalized understanding. After Visit Summary given.   SUBJECTIVE:  Paul Fry is a 72 y.o. male who presents who describes seeing "black stool" yesterday with bowel movement. H/O GI bleed with similar. Desires to check Hg. No lightheadedness/dizziness. No CP/SOB/palpitations. Does describe foul odor with BM yesterday. Symptoms are improved since beginning. Reports stool is back to normal color today. No bright red blood. Aggravating factors: none. Alleviating factors: none. Associated symptoms: none. He denies fever. Appetite: normal. PO intake: normal. Ambulatory without assistance. Urinary symptoms: none. Last bowel movement this morning without blood. No Pepto Bismol use. OTC treatment: none.   Past Surgical History:  Procedure Laterality Date  . APPENDECTOMY  teenager  . CARDIAC CATHETERIZATION  09-10-2008   dr dalton mclean   mild non-obstructive CAD;  30-40% mRCA, 20%pLAD,  ef 55%  . CARDIOVASCULAR STRESS TEST  07/29/2011   Low risk nuclear study w/ no ischemia or infarct/  LVEF 47% and   apical hypokinesis (08-07-2012 cardiac MRI -- EF 49% w/ no hyperenhancement distal septal and apical hypokinesis, mild LAE, mild RVE, mild AR with mild dilated sinus 9mm)  . CATARACT EXTRACTION W/ INTRAOCULAR LENS  IMPLANT, BILATERAL  2016  . COLONOSCOPY  last one 12-21-2012  . CYSTOSCOPY WITH INSERTION OF UROLIFT N/A 06/17/2016   Procedure: CYSTOSCOPY WITH INSERTION OF UROLIFT;  Surgeon: [name], MD;  Location: [facility];  Service: Urology;  Laterality: N/A;  . ESOPHAGOGASTRODUODENOSCOPY  last one 11-22-2014  . TRANSTHORACIC ECHOCARDIOGRAM  09/17/2014   ef 50-55%/  trivial AR, MR and PR/  mild LAE and RAE/  mild TR    ROS: As per HPI. All other systems negative.  OBJECTIVE:  Vitals:   12/24/17 1054  BP: (!) 151/91  Pulse: 68  Resp: 18  Temp: 98.1 F (36.7 C)  SpO2: 100%    General appearance: alert; no distress Lungs: clear to auscultation bilaterally Heart: regular rate and rhythm Abdomen: soft; non-distended; no tenderness; bowel sounds present; no masses or organomegaly; no guarding or rebound tenderness Back: no CVA tenderness GU: declines today Extremities: no edema; symmetrical with no gross deformities Skin: warm and dry Psychological: alert and cooperative; normal mood and affect  Labs: Results for orders placed or performed during the hospital encounter of 12/24/17  I-STAT, chem 8  Result Value Ref Range   Sodium 140 135 - 145 mmol/L   Potassium 3.9 3.5 - 5.1 mmol/L   Chloride 104 98 - 111 mmol/L   BUN 12 8 - 23 mg/dL   Creatinine, Ser 0.90 0.61 - 1.24 mg/dL   Glucose, Bld 162 (H) 70 -  99 mg/dL   Calcium, Ion 1.18 1.15 - 1.40 mmol/L   TCO2 26 22 - 32 mmol/L   Hemoglobin 15.3 13.0 - 17.0 g/dL   HCT 45.0 39.0 - 52.0 %   Labs Reviewed  POCT I-STAT, CHEM 8 - Abnormal; Notable for the following components:      Result Value   Glucose, Bld 162 (*)    All other components within normal limits    Allergies  Allergen Reactions  .  Aspirin Other (See Comments)    History of GI bleed                                               Past Medical History:  Diagnosis Date  . Allergy    SEASONAL  . Anemia   . Arthritis    KNEES  . BPH (benign prostatic hyperplasia)   . Diverticulosis of colon   . Elevated PSA    1.22 on 01-20-2016  . Feeling of incomplete bladder emptying   . GERD (gastroesophageal reflux disease)   . Hiatal hernia   . History of adenomatous polyp of colon    2014 tubular adenoma  . History of chronic gastritis   . History of Helicobacter pylori infection    07/ 2016  . History of lower GI bleeding    07/ 2010 and 12/ 2011  diverticular bleed both times  . Hyperlipidemia   . Hypertension   . Lower urinary tract symptoms (LUTS)   . Nonischemic cardiomyopathy ([facility])   . Plantar fascia syndrome   . Strains to urinate    INTERMITTANT  . Type 2 diabetes, diet controlled ([facility])    ON BORDER LINE,NEVER TOOK MEDICATION.  [name] Wears glasses    Social History   Socioeconomic History  . Marital status: Married    Spouse name: Not on file  . Number of children: 0  . Years of education: Not on file  . Highest education level: Not on file  Occupational History  . Occupation: truck Geophysicist/field seismologist- retired    Fish farm manager: RETIRED  Social Needs  . Financial resource strain: Not on file  . Food insecurity:    Worry: Not on file    Inability: Not on file  . Transportation needs:    Medical: Not on file    Non-medical: Not on file  Tobacco Use  . Smoking status: Never Smoker  . Smokeless tobacco: Never Used  Substance and Sexual Activity  . Alcohol use: Yes    Comment: rarely drinks beer  . Drug use: No  . Sexual activity: Not on file  Lifestyle  . Physical activity:    Days per week: Not on file    Minutes per session: Not on file  . Stress: Not on file  Relationships  . Social connections:    Talks on phone: Not on file    Gets together: Not on file    Attends religious service: Not on file     Active member of club or organization: Not on file    Attends meetings of clubs or organizations: Not on file    Relationship status: Not on file  . Intimate partner violence:    Fear of current or ex partner: Not on file    Emotionally abused: Not on file    Physically abused: Not on file    Forced sexual activity:  Not on file  Other Topics Concern  . Not on file  Social History Narrative   Married 2003, no children.    Retired Administrator, gets regular exercise- walking.    Step daughter is MD in [place]   Family History  Problem Relation Age of Onset  . Colon polyps Brother   . Colon cancer Mother 43  . Lung cancer Brother   . Heart disease Sister        MI, pace maker  . Kidney disease Sister        renal failure, nephrectomy- not cancer  . Hypertension Sister   . Hypertension Sister   . Diabetes Neg Hx        DM  . Depression Neg Hx   . Stroke Neg Hx   . Alcohol abuse Neg Hx        also no drug abuse   . Prostate cancer Neg Hx      Vanessa Kick, MD 12/24/17 1224

## 2017-12-24 NOTE — ED Triage Notes (Signed)
Pt states he has hx of gi bleeding, never has had a transfusion before. Pt noticed blood in his stool yesterday, states its clearing up today. Denies pain. Denies dizziness.

## 2018-01-02 DIAGNOSIS — H43811 Vitreous degeneration, right eye: Secondary | ICD-10-CM | POA: Diagnosis not present

## 2018-02-23 ENCOUNTER — Ambulatory Visit (INDEPENDENT_AMBULATORY_CARE_PROVIDER_SITE_OTHER): Payer: Medicare Other

## 2018-02-23 ENCOUNTER — Encounter

## 2018-02-23 DIAGNOSIS — Z23 Encounter for immunization: Secondary | ICD-10-CM | POA: Diagnosis not present

## 2018-03-27 DIAGNOSIS — N401 Enlarged prostate with lower urinary tract symptoms: Secondary | ICD-10-CM | POA: Diagnosis not present

## 2018-03-27 DIAGNOSIS — R3912 Poor urinary stream: Secondary | ICD-10-CM | POA: Diagnosis not present

## 2018-07-04 NOTE — Progress Notes (Signed)
Dr. Frederico Hamman T. Carl Bleecker, MD, [facility] [address] [place], [address] Phone: [phone] Fax: [phone]  07/05/2018  Patient: Paul Fry, MRN: [id number], DOB: [birth year], 73 y.o.  Primary Physician:  [name], MD   Chief Complaint  Patient presents with  . Foot Pain    Burning & Tender-Bilateral   Subjective:   This 73 y.o. male patient presents with a 2-3 week long history of L >> R heel pain. This is notable for worsening pain first thing in the morning when arising and standing after sitting.   Burning a lot - L worse at the heel.  Worst with getting up in the morning.    Prior foot or ankle fractures: none Prior operations: none Orthotics or bracing: Y Medications: none PT or home rehab: none recently Night splints: no Ice massage: no Ball massage: no  Metatarsal pain: no  The PMH, PSH, Social History, Family History, Medications, and allergies have been reviewed in [facility], and have been updated if relevant.  Patient Active Problem List   Diagnosis Date Noted  . BPH (benign prostatic hyperplasia) 12/23/2016  . Lower GI bleed 12/22/2016  . Advance care planning 07/16/2014  . Hyperglycemia 07/16/2014  . HTN (hypertension) 07/09/2013  . Paresthesia 12/25/2012  . Cardiomyopathy ([facility]) 09/26/2012  . ED (erectile dysfunction) 05/26/2012  . DOE (dyspnea on exertion) 06/18/2011  . Medicare annual wellness visit, subsequent 05/26/2011  . Hypercholesteremia 03/03/2011  . CAD, UNSPECIFIED SITE 10/14/2008  . GERD 10/14/2008  . DIVERTICULOSIS OF COLON 04/03/2008    Past Medical History:  Diagnosis Date  . Allergy    SEASONAL  . Anemia   . Arthritis    KNEES  . BPH (benign prostatic hyperplasia)   . Diverticulosis of colon   . Elevated PSA    1.22 on 01-20-2016  . Feeling of incomplete bladder emptying   . GERD (gastroesophageal reflux disease)   . Hiatal hernia   . History of adenomatous polyp  of colon    2014 tubular adenoma  . History of chronic gastritis   . History of Helicobacter pylori infection    07/ 2016  . History of lower GI bleeding    07/ 2010 and 12/ 2011  diverticular bleed both times  . Hyperlipidemia   . Hypertension   . Lower urinary tract symptoms (LUTS)   . Nonischemic cardiomyopathy ([facility])   . Plantar fascia syndrome   . Strains to urinate    INTERMITTANT  . Type 2 diabetes, diet controlled ([facility])    ON BORDER LINE,NEVER TOOK MEDICATION.  [name] Wears glasses     Past Surgical History:  Procedure Laterality Date  . APPENDECTOMY  teenager  . CARDIAC CATHETERIZATION  09-10-2008   dr dalton mclean   mild non-obstructive CAD;  30-40% mRCA, 20%pLAD,  ef 55%  . CARDIOVASCULAR STRESS TEST  07/29/2011   Low risk nuclear study w/ no ischemia or infarct/  LVEF 47% and  apical hypokinesis (08-07-2012 cardiac MRI -- EF 49% w/ no hyperenhancement distal septal and apical hypokinesis, mild LAE, mild RVE, mild AR with mild dilated sinus 11mm)  . CATARACT EXTRACTION W/ INTRAOCULAR LENS  IMPLANT, BILATERAL  2016  . COLONOSCOPY  last one 12-21-2012  . CYSTOSCOPY WITH INSERTION OF UROLIFT N/A 06/17/2016   Procedure: CYSTOSCOPY WITH INSERTION OF UROLIFT;  Surgeon: [name], MD;  Location: [facility];  Service: Urology;  Laterality: N/A;  . ESOPHAGOGASTRODUODENOSCOPY  last one  11-22-2014  . TRANSTHORACIC ECHOCARDIOGRAM  09/17/2014   ef 50-55%/  trivial AR, MR and PR/  mild LAE and RAE/  mild TR    Social History   Socioeconomic History  . Marital status: Married    Spouse name: Not on file  . Number of children: 0  . Years of education: Not on file  . Highest education level: Not on file  Occupational History  . Occupation: truck Geophysicist/field seismologist- retired    Fish farm manager: RETIRED  Social Needs  . Financial resource strain: Not on file  . Food insecurity:    Worry: Not on file    Inability: Not on file  . Transportation needs:    Medical: Not on file      Non-medical: Not on file  Tobacco Use  . Smoking status: Never Smoker  . Smokeless tobacco: Never Used  Substance and Sexual Activity  . Alcohol use: Yes    Comment: rarely drinks beer  . Drug use: No  . Sexual activity: Not on file  Lifestyle  . Physical activity:    Days per week: Not on file    Minutes per session: Not on file  . Stress: Not on file  Relationships  . Social connections:    Talks on phone: Not on file    Gets together: Not on file    Attends religious service: Not on file    Active member of club or organization: Not on file    Attends meetings of clubs or organizations: Not on file    Relationship status: Not on file  . Intimate partner violence:    Fear of current or ex partner: Not on file    Emotionally abused: Not on file    Physically abused: Not on file    Forced sexual activity: Not on file  Other Topics Concern  . Not on file  Social History Narrative   Married 2003, no children.    Retired Administrator, gets regular exercise- walking.    Step daughter is MD in [place]    Family History  Problem Relation Age of Onset  . Colon polyps Brother   . Colon cancer Mother 55  . Lung cancer Brother   . Heart disease Sister        MI, pace maker  . Kidney disease Sister        renal failure, nephrectomy- not cancer  . Hypertension Sister   . Hypertension Sister   . Diabetes Neg Hx        DM  . Depression Neg Hx   . Stroke Neg Hx   . Alcohol abuse Neg Hx        also no drug abuse   . Prostate cancer Neg Hx     Allergies  Allergen Reactions  . Aspirin Other (See Comments)    History of GI bleed    Medication list reviewed and updated in full in [facility].  GEN: No fevers, chills. Nontoxic. Primarily MSK c/o today. MSK: Detailed in the HPI GI: tolerating PO intake without difficulty Neuro: No numbness, parasthesias, or tingling associated. Otherwise the pertinent positives of the ROS are noted above.   Objective:   Blood  pressure 100/70, pulse 76, temperature 98.2 F (36.8 C), temperature source Oral, height 5' 10.5" (1.791 m), weight 222 lb 12 oz (101 kg).  GEN: Well-developed,well-nourished,in no acute distress; alert,appropriate and cooperative throughout examination HEENT: Normocephalic and atraumatic without obvious abnormalities. Ears, externally no deformities PULM: Breathing comfortably in  no respiratory distress EXT: No clubbing, cyanosis, or edema PSYCH: Normally interactive. Cooperative during the interview. Pleasant. Friendly and conversant. Not anxious or depressed appearing. Normal, full affect.  B Echymosis: no Edema: no ROM: full LE B Gait: heel toe, non-antalgic MT pain: no Callus pattern: none Lateral Mall: NT Medial Mall: NT Talus: NT Navicular: NT Calcaneous: NT Metatarsals: NT 5th MT: NT Phalanges: NT Achilles: NT Plantar Fascia: tender, medial along PF. Pain with forced dorsi - L Fat Pad: NT Peroneals: NT Post Tib: NT Great Toe: Nml motion Ant Drawer: neg Other foot breakdown: none Long arch: preserved Transverse arch: preserved Hindfoot breakdown: none Sensation: intact  Assessment and Plan:   Plantar fasciitis, left  >25 minutes spent in face to face time with patient, >50% spent in counselling or coordination of care   Reexacerbation of prior chronic PF Anatomy reviewed. Stretching and rehab are critically important to the treatment of PF. Reviewed footwear. Rigid soles have been shown to help with PF.  Reviewed rehab of stretching and calf raises.  Reviewed rehab from [facility]  Patient Instructions  Please read handouts on Plantar Fascitis.    STRETCHING and Strengthening program critically important.    Strengthening on foot and calf muscles as seen in handout.  Calf raises, 2 legged, then 1 legged.  Foot massage with tennis ball.  Ice massage.   Towel Scrunches: get a towel or hand towel, use toes to pick up and  scrunch up the towel.  Marble pick-ups, practice picking up marbles with toes and placing into a cup  NEEDS TO BE DONE EVERY DAY    Recommended over the counter insoles. (Spenco or Hapad)  A rigid shoe with good arch support helps: Dansko (great), Jennet Maduro, Merrell No easily bendable shoes.   Tuli's heel cups      Follow-up: No follow-ups on file.  Signed,  Maud Deed. Peg Fifer, MD   Patient's Medications  New Prescriptions   No medications on file  Previous Medications   ASCORBIC ACID (VITAMIN C) 1000 MG TABLET    Take 1,000 mg by mouth daily.   ATORVASTATIN (LIPITOR) 20 MG TABLET    Take 1 tablet (20 mg total) by mouth every morning.   CALCIUM CARBONATE (TUMS) 500 MG CHEWABLE TABLET    Chew 1 tablet by mouth as needed for indigestion. Reported on 07/17/2015   CARVEDILOL (COREG) 3.125 MG TABLET    Take 1 tablet (3.125 mg total) by mouth 2 (two) times daily.   CETIRIZINE (ZYRTEC) 10 MG TABLET    Take 10 mg by mouth daily as needed for allergies.    FISH OIL-OMEGA-3 FATTY ACIDS 1000 MG CAPSULE    Take 1 g by mouth daily.   FLUTICASONE (FLONASE) 50 MCG/ACT NASAL SPRAY    Place 2 sprays into both nostrils daily as needed for allergies or rhinitis.   LOSARTAN (COZAAR) 25 MG TABLET    TAKE 1 TABLET EVERY MORNING   MULTIPLE VITAMIN (MULTIVITAMIN) TABLET    Take 1 tablet by mouth daily.   OMEPRAZOLE (PRILOSEC) 20 MG CAPSULE    Take 1 capsule (20 mg total) by mouth 2 (two) times daily.   TAMSULOSIN (FLOMAX) 0.4 MG CAPS CAPSULE    TAKE 2 CAPSULES EVERY DAY  Modified Medications   No medications on file  Discontinued Medications   No medications on file

## 2018-07-05 ENCOUNTER — Ambulatory Visit (INDEPENDENT_AMBULATORY_CARE_PROVIDER_SITE_OTHER): Payer: Medicare Other | Admitting: Family Medicine

## 2018-07-05 ENCOUNTER — Encounter: Payer: Self-pay | Admitting: Family Medicine

## 2018-07-05 VITALS — BP 100/70 | HR 76 | Temp 98.2°F | Ht 70.5 in | Wt 222.8 lb

## 2018-07-05 DIAGNOSIS — M722 Plantar fascial fibromatosis: Secondary | ICD-10-CM

## 2018-07-05 NOTE — Patient Instructions (Signed)

## 2018-07-27 ENCOUNTER — Other Ambulatory Visit: Payer: Self-pay

## 2018-07-27 ENCOUNTER — Other Ambulatory Visit (INDEPENDENT_AMBULATORY_CARE_PROVIDER_SITE_OTHER): Payer: Medicare Other

## 2018-07-27 ENCOUNTER — Ambulatory Visit: Payer: Medicare Other

## 2018-07-27 ENCOUNTER — Other Ambulatory Visit: Payer: Self-pay | Admitting: Family Medicine

## 2018-07-27 DIAGNOSIS — E7800 Pure hypercholesterolemia, unspecified: Secondary | ICD-10-CM | POA: Diagnosis not present

## 2018-07-27 DIAGNOSIS — R739 Hyperglycemia, unspecified: Secondary | ICD-10-CM | POA: Diagnosis not present

## 2018-07-27 LAB — COMPREHENSIVE METABOLIC PANEL
ALT: 33 U/L (ref 0–53)
AST: 26 U/L (ref 0–37)
Albumin: 4 g/dL (ref 3.5–5.2)
Alkaline Phosphatase: 62 U/L (ref 39–117)
BUN: 10 mg/dL (ref 6–23)
CALCIUM: 9.3 mg/dL (ref 8.4–10.5)
CO2: 30 meq/L (ref 19–32)
Chloride: 102 mEq/L (ref 96–112)
Creatinine, Ser: 0.97 mg/dL (ref 0.40–1.50)
GFR: 91.87 mL/min (ref >60.00)
Glucose, Bld: 140 mg/dL — ABNORMAL HIGH (ref 70–99)
Potassium: 4.3 mEq/L (ref 3.5–5.1)
Sodium: 138 mEq/L (ref 135–145)
Total Bilirubin: 0.7 mg/dL (ref 0.2–1.2)
Total Protein: 7.4 g/dL (ref 6.0–8.3)

## 2018-07-27 LAB — LIPID PANEL
CHOL/HDL RATIO: 5
Cholesterol: 143 mg/dL (ref 0–200)
HDL: 26.7 mg/dL — ABNORMAL LOW (ref >39.00)
LDL Cholesterol: 88 mg/dL (ref 0–99)
NonHDL: 116.72
Triglycerides: 142 mg/dL (ref 0.0–149.0)
VLDL: 28.4 mg/dL (ref 0.0–40.0)

## 2018-07-27 LAB — HEMOGLOBIN A1C: Hgb A1c MFr Bld: 6.8 % — ABNORMAL HIGH (ref 4.6–6.5)

## 2018-07-30 ENCOUNTER — Encounter: Payer: Self-pay | Admitting: Family Medicine

## 2018-07-30 ENCOUNTER — Telehealth: Payer: Self-pay | Admitting: Family Medicine

## 2018-07-30 DIAGNOSIS — E119 Type 2 diabetes mellitus without complications: Secondary | ICD-10-CM | POA: Insufficient documentation

## 2018-07-30 NOTE — Telephone Encounter (Signed)
Notify patient. Upcoming appointment may need to be postponed due to coronavirus pandemic. His sugar is elevated compared to previous.  He crossed the threshold into diabetes but he does not need medication for it at this point. Would continue his current medications as his lipids, kidney function, liver tests are reasonable. Would be reasonable to try to reschedule in 2-3 months, assuming he did not have an urgent need in the meantime. Reasonable to work on low carbohydrate diet and exercise as tolerated in the meantime.  Thanks.

## 2018-07-31 NOTE — Telephone Encounter (Signed)
Patient advised.

## 2018-08-01 ENCOUNTER — Encounter: Payer: Medicare Other | Admitting: Family Medicine

## 2018-08-24 ENCOUNTER — Encounter: Payer: Self-pay | Admitting: Cardiology

## 2018-08-24 ENCOUNTER — Telehealth (INDEPENDENT_AMBULATORY_CARE_PROVIDER_SITE_OTHER): Payer: Medicare Other | Admitting: Cardiology

## 2018-08-24 ENCOUNTER — Other Ambulatory Visit: Payer: Self-pay

## 2018-08-24 VITALS — BP 131/69 | HR 70 | Ht 70.5 in | Wt 210.0 lb

## 2018-08-24 DIAGNOSIS — E7800 Pure hypercholesterolemia, unspecified: Secondary | ICD-10-CM

## 2018-08-24 DIAGNOSIS — I428 Other cardiomyopathies: Secondary | ICD-10-CM | POA: Diagnosis not present

## 2018-08-24 DIAGNOSIS — I10 Essential (primary) hypertension: Secondary | ICD-10-CM

## 2018-08-24 DIAGNOSIS — E119 Type 2 diabetes mellitus without complications: Secondary | ICD-10-CM

## 2018-08-24 NOTE — Progress Notes (Signed)
Virtual Visit via Video Note   This visit type was conducted due to national recommendations for restrictions regarding the COVID-19 Pandemic (e.g. social distancing) in an effort to limit this patient's exposure and mitigate transmission in our community.  Due to his co-morbid illnesses, this patient is at least at moderate risk for complications without adequate follow up.  This format is felt to be most appropriate for this patient at this time.  All issues noted in this document were discussed and addressed.  A limited physical exam was performed with this format.  Please refer to the patient's chart for his consent to telehealth for [facility].   Evaluation Performed:  Follow-up visit  Date:  08/24/2018   ID:  Paul Fry, DOB 07/01/1945, MRN [id number]  Patient Location: Home Provider Location: Home  PCP:  [name], MD  Cardiologist:  Truitt Merle, NP  Electrophysiologist:  None   Chief Complaint:    History of Present Illness:    Paul Fry is a 73 y.o. male with nonischemic cardiomyopathy catheterization 2010 with mild nonobstructive CAD, with most recent echocardiogram showing 50 to 55% EF.  GI bleed previously.  Overall doing quite well. GERD occsionally.   Per Dr. [name] prior note:  PMH: 1. GERD 2. Plantar fasciitis 3. Hyperlipidemia 4. Diverticulosis with diverticular bleeding x 2. Stopped ASA. 5. Left heart cath 5/10: EF 55%, 30-40% mid RCA.  6. Cardiomyopathy: Mild. ETT-Myoview (3/13) with EF 47%, no ischemia or infarction. Echo (3/13) with EF 50-55%, mild LVH, mild MR, mild AI. 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  - Echo (2016) with EF 50-55%, mild biatrial enlargement.  7. Type II diabetes   The patient does not have symptoms concerning for COVID-19 infection (fever, chills, cough, or new shortness of breath).    Past Medical History:   Diagnosis Date  . Allergy    SEASONAL  . Anemia   . Arthritis    KNEES  . BPH (benign prostatic hyperplasia)   . Diverticulosis of colon   . Elevated PSA    1.22 on 01-20-2016  . Feeling of incomplete bladder emptying   . GERD (gastroesophageal reflux disease)   . Hiatal hernia   . History of adenomatous polyp of colon    2014 tubular adenoma  . History of chronic gastritis   . History of Helicobacter pylori infection    07/ 2016  . History of lower GI bleeding    07/ 2010 and 12/ 2011  diverticular bleed both times  . Hyperlipidemia   . Hypertension   . Lower urinary tract symptoms (LUTS)   . Nonischemic cardiomyopathy ([place])   . Plantar fascia syndrome   . Strains to urinate    INTERMITTANT  . Type 2 diabetes, diet controlled (Paul Fry)    ON BORDER LINE,NEVER TOOK MEDICATION.  Paul Fry Wears glasses    Past Surgical History:  Procedure Laterality Date  . APPENDECTOMY  teenager  . CARDIAC CATHETERIZATION  09-10-2008   dr dalton mclean   mild non-obstructive CAD;  30-40% mRCA, 20%pLAD,  ef 55%  . CARDIOVASCULAR STRESS TEST  07/29/2011   Low risk nuclear study w/ no ischemia or infarct/  LVEF 47% and  apical hypokinesis (08-07-2012 cardiac MRI -- EF 49% w/ no hyperenhancement distal septal and apical hypokinesis, mild LAE, mild RVE, mild AR with mild dilated sinus 16mm)  . CATARACT EXTRACTION W/ INTRAOCULAR LENS  IMPLANT, BILATERAL  2016  . COLONOSCOPY  last one 12-21-2012  . CYSTOSCOPY WITH INSERTION OF UROLIFT N/A 06/17/2016   Procedure: CYSTOSCOPY WITH INSERTION OF UROLIFT;  Surgeon: [name], MD;  Location: [facility];  Service: Urology;  Laterality: N/A;  . ESOPHAGOGASTRODUODENOSCOPY  last one 11-22-2014  . TRANSTHORACIC ECHOCARDIOGRAM  09/17/2014   ef 50-55%/  trivial AR, MR and PR/  mild LAE and RAE/  mild TR     Current Meds  Medication Sig  . ascorbic acid (VITAMIN C) 1000 MG tablet Take 1,000 mg by mouth daily.  Paul Fry atorvastatin (LIPITOR) 20 MG  tablet Take 1 tablet (20 mg total) by mouth every morning.  . calcium carbonate (TUMS) 500 MG chewable tablet Chew 1 tablet by mouth as needed for indigestion. Reported on 07/17/2015  . carvedilol (COREG) 3.125 MG tablet Take 1 tablet (3.125 mg total) by mouth 2 (two) times daily.  . cetirizine (ZYRTEC) 10 MG tablet Take 10 mg by mouth daily as needed for allergies.   . fish oil-omega-3 fatty acids 1000 MG capsule Take 1 g by mouth daily.  . fluticasone (FLONASE) 50 MCG/ACT nasal spray Place 2 sprays into both nostrils daily as needed for allergies or rhinitis.  Paul Fry losartan (COZAAR) 25 MG tablet TAKE 1 TABLET EVERY MORNING  . Multiple Vitamin (MULTIVITAMIN) tablet Take 1 tablet by mouth daily.  Paul Fry omeprazole (PRILOSEC) 20 MG capsule Take 1 capsule (20 mg total) by mouth 2 (two) times daily.  . tamsulosin (FLOMAX) 0.4 MG CAPS capsule TAKE 2 CAPSULES EVERY DAY     Allergies:   Aspirin   Social History   Tobacco Use  . Smoking status: Never Smoker  . Smokeless tobacco: Never Used  Substance Use Topics  . Alcohol use: Yes    Comment: rarely drinks beer  . Drug use: No     Family Hx: The patient's family history includes Colon cancer (age of onset: 71) in his mother; Colon polyps in his brother; Heart disease in his sister; Hypertension in his sister and sister; Kidney disease in his sister; Lung cancer in his brother. There is no history of Diabetes, Depression, Stroke, Alcohol abuse, or Prostate cancer.  ROS:   Please see the history of present illness.    Denies any fevers chills nausea vomiting syncope bleeding All other systems reviewed and are negative.   Prior CV studies:   The following studies were reviewed today:  Prior studies as above-last echo EF 50 to 55%  Labs/Other Tests and Data Reviewed:    EKG:  An ECG dated 05/17/17 was personally reviewed today and demonstrated:  Normal sinus rhythm-80 no other abnormalities  Recent Labs: 12/24/2017: Hemoglobin 15.3 07/27/2018:  ALT 33; BUN 10; Creatinine, Ser 0.97; Potassium 4.3; Sodium 138   Recent Lipid Panel Lab Results  Component Value Date/Time   CHOL 143 07/27/2018 08:57 AM   TRIG 142.0 07/27/2018 08:57 AM   HDL 26.70 (L) 07/27/2018 08:57 AM   CHOLHDL 5 07/27/2018 08:57 AM   LDLCALC 88 07/27/2018 08:57 AM    Wt Readings from Last 3 Encounters:  08/24/18 210 lb (95.3 kg)  07/05/18 222 lb 12 oz (101 kg)  10/04/17 210 lb (95.3 kg)     Objective:    Vital Signs:  BP 131/69   Pulse 70   Ht 5' 10.5" (1.791 m)   Wt 210 lb (95.3 kg)   BMI 29.71 kg/m   Well nourished, well developed male in no acute distress. Normal respiratory effort, no scleral  icterus, alert and oriented x3  ASSESSMENT & PLAN:    Nonischemic dilated cardiomyopathy - Excellent resolution of ejection fraction back on last check in 2016 with prior MRI showing EF of 49% with possible non-compaction.  This was seen at apex.  Overall he is doing quite well.  On both low-dose beta-blocker and angiotensin receptor blocker, no changes made to medications.  His blood pressure has been on the low side.  No symptoms.  Diabetes with hypertension -He is not currently on any medications for this, diet controlled.  Doing a great job.  Hyperlipidemia - Continuing with atorvastatin, he did have mild nonobstructive coronary artery disease on his cardiac catheterization.  No anginal symptoms.  Prior LDL 76.  Prior GI bleed - He has been avoiding aspirin.    COVID-19 Education: The signs and symptoms of COVID-19 were discussed with the patient and how to seek care for testing (follow up with PCP or arrange E-visit).  The importance of social distancing was discussed today.  Time:   Today, I have spent 12 minutes with the patient with telehealth technology discussing the above problems.     Medication Adjustments/Labs and Tests Ordered: Current medicines are reviewed at length with the patient today.  Concerns regarding medicines are outlined  above.   Tests Ordered: No orders of the defined types were placed in this encounter.   Medication Changes: No orders of the defined types were placed in this encounter.   Disposition:  Follow up in 1 year(s)  Signed, Candee Furbish, MD  08/24/2018 9:43 AM    [facility]

## 2018-08-24 NOTE — Patient Instructions (Signed)
  Medication Instructions:  The current medical regimen is effective;  continue present plan and medications.  If you need a refill on your cardiac medications before your next appointment, please call your pharmacy.   Follow-Up: Follow up in 1 year with Dr. Skains.  You will receive a letter in the mail 2 months before you are due.  Please call us when you receive this letter to schedule your follow up appointment.  Thank you for choosing [facility]!!     

## 2018-09-09 ENCOUNTER — Other Ambulatory Visit: Payer: Self-pay | Admitting: Family Medicine

## 2018-09-09 DIAGNOSIS — E7800 Pure hypercholesterolemia, unspecified: Secondary | ICD-10-CM

## 2018-09-09 DIAGNOSIS — I429 Cardiomyopathy, unspecified: Secondary | ICD-10-CM

## 2018-09-11 NOTE — Telephone Encounter (Signed)
Electronic refill request. Atorvastatin Last office visit:   07/05/18 Acute - [place] - plantar fascitis Last Filled:

## 2018-10-06 DIAGNOSIS — H52203 Unspecified astigmatism, bilateral: Secondary | ICD-10-CM | POA: Diagnosis not present

## 2018-10-06 DIAGNOSIS — H43811 Vitreous degeneration, right eye: Secondary | ICD-10-CM | POA: Diagnosis not present

## 2018-10-06 DIAGNOSIS — Z961 Presence of intraocular lens: Secondary | ICD-10-CM | POA: Diagnosis not present

## 2018-10-06 DIAGNOSIS — H524 Presbyopia: Secondary | ICD-10-CM | POA: Diagnosis not present

## 2018-10-09 ENCOUNTER — Ambulatory Visit (INDEPENDENT_AMBULATORY_CARE_PROVIDER_SITE_OTHER): Payer: Medicare Other

## 2018-10-09 ENCOUNTER — Other Ambulatory Visit (INDEPENDENT_AMBULATORY_CARE_PROVIDER_SITE_OTHER): Payer: Medicare Other

## 2018-10-09 ENCOUNTER — Other Ambulatory Visit: Payer: Self-pay | Admitting: Family Medicine

## 2018-10-09 DIAGNOSIS — E119 Type 2 diabetes mellitus without complications: Secondary | ICD-10-CM

## 2018-10-09 DIAGNOSIS — Z0000 Encounter for general adult medical examination without abnormal findings: Secondary | ICD-10-CM | POA: Diagnosis not present

## 2018-10-09 DIAGNOSIS — Z125 Encounter for screening for malignant neoplasm of prostate: Secondary | ICD-10-CM

## 2018-10-09 LAB — HEMOGLOBIN A1C: Hgb A1c MFr Bld: 6.8 % — ABNORMAL HIGH (ref 4.6–6.5)

## 2018-10-09 LAB — PSA, MEDICARE: PSA: 1.08 ng/ml (ref 0.10–4.00)

## 2018-10-09 LAB — GLUCOSE, RANDOM: Glucose, Bld: 128 mg/dL — ABNORMAL HIGH (ref 70–99)

## 2018-10-09 NOTE — Progress Notes (Signed)
Subjective:   Paul Fry is a 73 y.o. male who presents for Medicare Annual/Subsequent preventive examination.  Review of Systems:  N/A Cardiac Risk Factors include: advanced age (>6men, >75 women);male gender;dyslipidemia;hypertension;obesity (BMI >30kg/m2)     Objective:    Vitals: There were no vitals taken for this visit.  There is no height or weight on file to calculate BMI.  Advanced Directives 10/09/2018 07/22/2017 12/23/2016 12/22/2016 06/17/2016 11/22/2014  Does Patient Have a Medical Advance Directive? Yes Yes No No Yes Yes  Type of Paramedic of [place];Living will [place];Living will - - Living will -  Does patient want to make changes to medical advance directive? - - - - No - Patient declined -  Copy of [place] in Chart? No - copy requested No - copy requested - - - No - copy requested  Would patient like information on creating a medical advance directive? - - No - Patient declined - - -    Tobacco Social History   Tobacco Use  Smoking Status Never Smoker  Smokeless Tobacco Never Used     Counseling given: No   Clinical Intake:  Pre-visit preparation completed: Yes  Pain : No/denies pain Pain Score: 0-No pain     Nutritional Status: BMI > 30  Obese Nutritional Risks: None Diabetes: No CBG done?: No Did pt. bring in CBG monitor from home?: No  How often do you need to have someone help you when you read instructions, pamphlets, or other written materials from your doctor or pharmacy?: 1 - Never What is the last grade level you completed in school?: 12th grade  Interpreter Needed?: No  Comments: pt lives with spouse Information entered by :: LPinson, LPN  Past Medical History:  Diagnosis Date  . Allergy    SEASONAL  . Anemia   . Arthritis    KNEES  . BPH (benign prostatic hyperplasia)   . Diverticulosis of colon   . Elevated PSA    1.22 on 01-20-2016  . Feeling of  incomplete bladder emptying   . GERD (gastroesophageal reflux disease)   . Hiatal hernia   . History of adenomatous polyp of colon    2014 tubular adenoma  . History of chronic gastritis   . History of Helicobacter pylori infection    07/ 2016  . History of lower GI bleeding    07/ 2010 and 12/ 2011  diverticular bleed both times  . Hyperlipidemia   . Hypertension   . Lower urinary tract symptoms (LUTS)   . Nonischemic cardiomyopathy ([place])   . Plantar fascia syndrome   . Strains to urinate    INTERMITTANT  . Type 2 diabetes, diet controlled ([place])    ON BORDER LINE,NEVER TOOK MEDICATION.  [name] Wears glasses    Past Surgical History:  Procedure Laterality Date  . APPENDECTOMY  teenager  . CARDIAC CATHETERIZATION  09-10-2008   dr dalton mclean   mild non-obstructive CAD;  30-40% mRCA, 20%pLAD,  ef 55%  . CARDIOVASCULAR STRESS TEST  07/29/2011   Low risk nuclear study w/ no ischemia or infarct/  LVEF 47% and  apical hypokinesis (08-07-2012 cardiac MRI -- EF 49% w/ no hyperenhancement distal septal and apical hypokinesis, mild LAE, mild RVE, mild AR with mild dilated sinus 36mm)  . CATARACT EXTRACTION W/ INTRAOCULAR LENS  IMPLANT, BILATERAL  2016  . COLONOSCOPY  last one 12-21-2012  . CYSTOSCOPY WITH INSERTION OF UROLIFT N/A 06/17/2016   Procedure:  CYSTOSCOPY WITH INSERTION OF UROLIFT;  Surgeon: [name], MD;  [facility]: [facility];  Service: Urology;  Laterality: N/A;  . ESOPHAGOGASTRODUODENOSCOPY  last one 11-22-2014  . TRANSTHORACIC ECHOCARDIOGRAM  09/17/2014   ef 50-55%/  trivial AR, MR and PR/  mild LAE and RAE/  mild TR   Family History  Problem Relation Age of Onset  . Colon polyps Brother   . Colon cancer Mother 24  . Lung cancer Brother   . Heart disease Sister        MI, pace maker  . Kidney disease Sister        renal failure, nephrectomy- not cancer  . Hypertension Sister   . Hypertension Sister   . Diabetes Neg Hx        DM  . Depression  Neg Hx   . Stroke Neg Hx   . Alcohol abuse Neg Hx        also no drug abuse   . Prostate cancer Neg Hx    Social History   Socioeconomic History  . Marital status: Married    Spouse name: Not on file  . Number of children: 0  . Years of education: Not on file  . Highest education level: Not on file  Occupational History  . Occupation: truck Geophysicist/field seismologist- retired    Fish farm manager: RETIRED  Social Needs  . Financial resource strain: Not on file  . Food insecurity:    Worry: Not on file    Inability: Not on file  . Transportation needs:    Medical: Not on file    Non-medical: Not on file  Tobacco Use  . Smoking status: Never Smoker  . Smokeless tobacco: Never Used  Substance and Sexual Activity  . Alcohol use: Yes    Comment: rarely drinks beer  . Drug use: No  . Sexual activity: Not Currently  Lifestyle  . Physical activity:    Days per week: Not on file    Minutes per session: Not on file  . Stress: Not on file  Relationships  . Social connections:    Talks on phone: Not on file    Gets together: Not on file    Attends religious service: Not on file    Active member of club or organization: Not on file    Attends meetings of clubs or organizations: Not on file    Relationship status: Not on file  Other Topics Concern  . Not on file  Social History Narrative   Married 2003, no children.    Retired Administrator, gets regular exercise- walking.    Step daughter is MD in [place] Encounter Medications as of 10/09/2018  Medication Sig  . ascorbic acid (VITAMIN C) 1000 MG tablet Take 1,000 mg by mouth daily.  [name] atorvastatin (LIPITOR) 20 MG tablet TAKE 1 TABLET EVERY MORNING  . calcium carbonate (TUMS) 500 MG chewable tablet Chew 1 tablet by mouth as needed for indigestion. Reported on 07/17/2015  . carvedilol (COREG) 3.125 MG tablet Take 1 tablet (3.125 mg total) by mouth 2 (two) times daily.  . cetirizine (ZYRTEC) 10 MG tablet Take 10 mg by mouth daily as needed  for allergies.   . fish oil-omega-3 fatty acids 1000 MG capsule Take 1 g by mouth daily.  . fluticasone (FLONASE) 50 MCG/ACT nasal spray Place 2 sprays into both nostrils daily as needed for allergies or rhinitis.  [name] losartan (COZAAR) 25 MG tablet TAKE 1 TABLET EVERY MORNING  .  Multiple Vitamin (MULTIVITAMIN) tablet Take 1 tablet by mouth daily.  . tamsulosin (FLOMAX) 0.4 MG CAPS capsule TAKE 2 CAPSULES EVERY DAY  . omeprazole (PRILOSEC) 20 MG capsule Take 1 capsule (20 mg total) by mouth 2 (two) times daily.   No facility-administered encounter medications on file as of 10/09/2018.     Activities of Daily Living In your present state of health, do you have any difficulty performing the following activities: 10/09/2018  Hearing? N  Vision? N  Difficulty concentrating or making decisions? N  Walking or climbing stairs? N  Dressing or bathing? N  Doing errands, shopping? N  Preparing Food and eating ? N  Using the Toilet? N  In the past six months, have you accidently leaked urine? N  Do you have problems with loss of bowel control? N  Managing your Medications? N  Managing your Finances? N  Housekeeping or managing your Housekeeping? N  Some recent data might be hidden    Patient Care Team: [name], MD as PCP - General (Family Medicine) Burtis Junes, NP as PCP - Cardiology (Nurse Practitioner)   Assessment:   This is a routine wellness examination for [place].  Vision Screening Comments: Last vision exam in May 2020 with Dr. Katy Apo  Exercise Activities and Dietary recommendations Current Exercise Habits: The patient does not participate in regular exercise at present, Exercise limited by: orthopedic condition(s)  Goals    . Patient Stated     Starting 10/09/2018, I will continue to take medications as prescribed.        Fall Risk Fall Risk  10/09/2018 07/22/2017 07/27/2016 07/17/2015 07/16/2014  Falls in the past year? 0 No No No No   Depression Screen PHQ 2/9  Scores 10/09/2018 07/22/2017 07/27/2016 07/17/2015  PHQ - 2 Score 0 0 0 0  PHQ- 9 Score 0 0 - -    Cognitive Function MMSE - Mini Mental State Exam 10/09/2018 07/22/2017  Orientation to time 5 5  Orientation to Place 5 5  Registration 3 3  Attention/ Calculation 0 0  Recall 3 3  Language- name 2 objects 0 0  Language- repeat 1 1  Language- follow 3 step command 0 3  Language- read & follow direction 0 0  Write a sentence 0 0  Copy design 0 0  Total score 17 20     PLEASE NOTE: A Mini-Cog screen was completed. Maximum score is 17. A value of 0 denotes this part of [place] MMSE was not completed or the patient failed this part of the Mini-Cog screening.   Mini-Cog Screening Orientation to Time - Max 5 pts Orientation to Place - Max 5 pts Registration - Max 3 pts Recall - Max 3 pts Language Repeat - Max 1 pts      Immunization History  Administered Date(s) Administered  . Hepatitis A, Adult 11/07/2014  . Influenza Split 05/25/2011  . Influenza Whole 04/25/2010  . Influenza, Seasonal, Injecte, Preservative Fre 05/25/2012  . Influenza,inj,Quad PF,6+ Mos 02/26/2014, 02/05/2016, 01/31/2017, 02/23/2018  . Pneumococcal Conjugate-13 07/16/2014  . Pneumococcal Polysaccharide-23 04/25/2010, 07/17/2015  . Td 05/11/1998, 09/18/2008  . Zoster 06/17/2011    Screening Tests Health Maintenance  Topic Date Due  . FOOT EXAM  10/09/2019 (Originally 11/13/1955)  . TETANUS/TDAP  05/09/2020 (Originally 09/19/2018)  . INFLUENZA VACCINE  12/09/2018  . HEMOGLOBIN A1C  01/27/2019  . OPHTHALMOLOGY EXAM  09/22/2019  . COLONOSCOPY  03/14/2022  . Hepatitis C Screening  Completed  . PNA vac Low  Risk Adult  Completed       Plan:     I have personally reviewed, addressed, and noted the following in the patient's chart:  A. Medical and social history B. Use of alcohol, tobacco or illicit drugs  C. Current medications and supplements D. Functional ability and status E.  Nutritional status F.   Physical activity G. Advance directives H. List of other physicians I.  Hospitalizations, surgeries, and ER visits in previous 12 months J.  Vitals (unless it is a telemedicine encounter) K. Screenings to include cognitive, depression, hearing, vision (NOTE: hearing and vision screenings not completed in telemedicine encounter) L. Referrals and appointments   In addition, I have reviewed and discussed with patient certain preventive protocols, quality metrics, and best practice recommendations. A written personalized care plan for preventive services and recommendations were provided to patient.  With patient's permission, we connected on 10/09/18 at  9:30 AM EDT by a video enabled telemedicine application. Two patient identifiers were used to ensure the encounter occurred with the correct person.    Patient was in home and writer was in office.   Signed,   Lindell Noe, MHA, BS, LPN Health Coach

## 2018-10-09 NOTE — Patient Instructions (Signed)
Paul Fry , Thank you for taking time to come for your Medicare Wellness Visit. I appreciate your ongoing commitment to your health goals. Please review the following plan we discussed and let me know if I can assist you in the future.   These are the goals we discussed: Goals    . Patient Stated     Starting 10/09/2018, I will continue to take medications as prescribed.        This is a list of the screening recommended for you and due dates:  Health Maintenance  Topic Date Due  . Complete foot exam   10/09/2019*  . Tetanus Vaccine  05/09/2020*  . Flu Shot  12/09/2018  . Hemoglobin A1C  01/27/2019  . Eye exam for diabetics  09/22/2019  . Colon Cancer Screening  03/14/2022  .  Hepatitis C: One time screening is recommended by Center for Disease Control  (CDC) for  adults born from [birth year] through 1965.   Completed  . Pneumonia vaccines  Completed  *Topic was postponed. The date shown is not the original due date.   Preventive Care for Adults  A healthy lifestyle and preventive care can promote health and wellness. Preventive health guidelines for adults include the following key practices.  . A routine yearly physical is a good way to check with your health care provider about your health and preventive screening. It is a chance to share any concerns and updates on your health and to receive a thorough exam.  . Visit your dentist for a routine exam and preventive care every 6 months. Brush your teeth twice a day and floss once a day. Good oral hygiene prevents tooth decay and gum disease.  . The frequency of eye exams is based on your age, health, family medical history, use  of contact lenses, and other factors. Follow your health care provider's recommendations for frequency of eye exams.  . Eat a healthy diet. Foods like vegetables, fruits, whole grains, low-fat dairy products, and lean protein foods contain the nutrients you need without too many calories. Decrease your intake of  foods high in solid fats, added sugars, and salt. Eat the right amount of calories for you. Get information about a proper diet from your health care provider, if necessary.  . Regular physical exercise is one of the most important things you can do for your health. Most adults should get at least 150 minutes of moderate-intensity exercise (any activity that increases your heart rate and causes you to sweat) each week. In addition, most adults need muscle-strengthening exercises on 2 or more days a week.  Silver Sneakers may be a benefit available to you. To determine eligibility, you may visit the website: www.silversneakers.com or contact program at 215-568-9735 Mon-Fri between 8AM-8PM.   . Maintain a healthy weight. The body mass index (BMI) is a screening tool to identify possible weight problems. It provides an estimate of body fat based on height and weight. Your health care provider can find your BMI and can help you achieve or maintain a healthy weight.   For adults 20 years and older: ? A BMI below 18.5 is considered underweight. ? A BMI of 18.5 to 24.9 is normal. ? A BMI of 25 to 29.9 is considered overweight. ? A BMI of 30 and above is considered obese.   . Maintain normal blood lipids and cholesterol levels by exercising and minimizing your intake of saturated fat. Eat a balanced diet with plenty of fruit and vegetables. Blood  tests for lipids and cholesterol should begin at age 72 and be repeated every 5 years. If your lipid or cholesterol levels are high, you are over 50, or you are at high risk for heart disease, you may need your cholesterol levels checked more frequently. Ongoing high lipid and cholesterol levels should be treated with medicines if diet and exercise are not working.  . If you smoke, find out from your health care provider how to quit. If you do not use tobacco, please do not start.  . If you choose to drink alcohol, please do not consume more than 2 drinks per  day. One drink is considered to be 12 ounces (355 mL) of beer, 5 ounces (148 mL) of wine, or 1.5 ounces (44 mL) of liquor.  . If you are 57-23 years old, ask your health care provider if you should take aspirin to prevent strokes.  . Use sunscreen. Apply sunscreen liberally and repeatedly throughout the day. You should seek shade when your shadow is shorter than you. Protect yourself by wearing long sleeves, pants, a wide-brimmed hat, and sunglasses year round, whenever you are outdoors.  . Once a month, do a whole body skin exam, using a mirror to look at the skin on your back. Tell your health care provider of new moles, moles that have irregular borders, moles that are larger than a pencil eraser, or moles that have changed in shape or color.

## 2018-10-09 NOTE — Progress Notes (Signed)
PCP notes:   Health maintenance:   Abnormal screenings:    Patient concerns:   Skin tag to left side of face - patient reports there have been changes.   DMV disability placard - pt advised to print application and bring by office for PCP review  Nurse concerns:  Referral to [facility] - order generated for PCP's  review due to patient's recent dx of diabetes and need for formal diabetic education. Patient is aware of referral and is agreeable to work with a diabetic educator.   Next PCP appt:   10/13/18 @ 1030  I reviewed health advisor's note, was available for consultation on the day of service listed in this note, and agree with documentation and plan. Elsie Stain, MD.

## 2018-10-13 ENCOUNTER — Encounter: Payer: Self-pay | Admitting: Family Medicine

## 2018-10-13 ENCOUNTER — Ambulatory Visit (INDEPENDENT_AMBULATORY_CARE_PROVIDER_SITE_OTHER): Payer: Medicare Other | Admitting: Family Medicine

## 2018-10-13 DIAGNOSIS — Z0000 Encounter for general adult medical examination without abnormal findings: Secondary | ICD-10-CM | POA: Diagnosis not present

## 2018-10-13 DIAGNOSIS — K922 Gastrointestinal hemorrhage, unspecified: Secondary | ICD-10-CM

## 2018-10-13 DIAGNOSIS — M722 Plantar fascial fibromatosis: Secondary | ICD-10-CM | POA: Diagnosis not present

## 2018-10-13 DIAGNOSIS — Z7189 Other specified counseling: Secondary | ICD-10-CM

## 2018-10-13 DIAGNOSIS — N400 Enlarged prostate without lower urinary tract symptoms: Secondary | ICD-10-CM

## 2018-10-13 DIAGNOSIS — I429 Cardiomyopathy, unspecified: Secondary | ICD-10-CM

## 2018-10-13 DIAGNOSIS — E7800 Pure hypercholesterolemia, unspecified: Secondary | ICD-10-CM

## 2018-10-13 DIAGNOSIS — K219 Gastro-esophageal reflux disease without esophagitis: Secondary | ICD-10-CM

## 2018-10-13 DIAGNOSIS — I10 Essential (primary) hypertension: Secondary | ICD-10-CM

## 2018-10-13 DIAGNOSIS — L989 Disorder of the skin and subcutaneous tissue, unspecified: Secondary | ICD-10-CM | POA: Diagnosis not present

## 2018-10-13 DIAGNOSIS — E119 Type 2 diabetes mellitus without complications: Secondary | ICD-10-CM

## 2018-10-13 MED ORDER — CARVEDILOL 3.125 MG PO TABS: 3.1250 mg | ORAL_TABLET | Freq: Two times a day (BID) | ORAL | 3 refills | Status: DC

## 2018-10-13 MED ORDER — OMEPRAZOLE 20 MG PO CPDR: 20.0000 mg | DELAYED_RELEASE_CAPSULE | Freq: Two times a day (BID) | ORAL | 3 refills | Status: DC

## 2018-10-13 MED ORDER — LOSARTAN POTASSIUM 25 MG PO TABS: 25.0000 mg | ORAL_TABLET | Freq: Every morning | ORAL | 3 refills | Status: DC

## 2018-10-13 MED ORDER — ATORVASTATIN CALCIUM 20 MG PO TABS: 20.0000 mg | ORAL_TABLET | Freq: Every morning | ORAL | 3 refills | Status: DC

## 2018-10-13 MED ORDER — TAMSULOSIN HCL 0.4 MG PO CAPS: 0.8000 mg | ORAL_CAPSULE | Freq: Every day | ORAL | 3 refills | Status: DC

## 2018-10-13 NOTE — Progress Notes (Signed)
Virtual visit completed through WebEx or similar program Patient location: home  Provider location: Financial controller at [facility], office   Pandemic considerations d/w pt.   Limitations and rationale for visit method d/w patient.  Patient agreed to proceed.   CC: f/u  HPI:  Flu encouraged Shingles 2013, shingrix out of stock PNA UTD Tetanus 2010 Colonoscopy 2018 PSA wnl.   Advance directive- wife designated if patient were incapacitated.  HCV screening neg, d/w pt.   Elevated Cholesterol: Using medications without problems: yes Muscle aches: no Diet compliance: yes Exercise: limited by plantar fasciitis.    Hypertension:    Using medication without problems or lightheadedness: yes Chest pain with exertion:no Edema:no Short of breath:no Labs d/w pt.    BPH. Still on flomax.  No ADE on med.  LUTS improved with procedure per urology, assuming he drinks adequate water.  GERD sx generally controlled. He has to watch his diet.  D/w pt.  No blood in stool.     H/o GIB. Off ASA.  Still on PPI.  No recent symptoms.  No black or bloody stools.  Diabetes:  No meds Hypoglycemic episodes: no Hyperglycemic episodes: no Feet problems: no loss of sensation but has trouble with plantar fasciitis.  Blood Sugars averaging:not checked.  eye exam within last year: yes A1c stable, d/w pt.   Skin tag to left side of face - can get irritated. D/w pt about options/eval here in the clinic.  DMV disability placard - pt advised to print application and bring by office for PCP review  Meds and allergies reviewed.   ROS: Per HPI unless specifically indicated in ROS section   NAD Speech wnl  A/P:  Flu encouraged Shingles 2013, shingrix out of stock PNA UTD Tetanus 2010 Colonoscopy 2018 PSA wnl.   Advance directive- wife designated if patient were incapacitated.  HCV screening neg, d/w pt.   Elevated Cholesterol: No change in meds at this point.  Labs discussed with  patient.  Hypertension:    No change in meds at this point.  Labs discussed with patient.  BPH. Still on flomax.  No ADE on med.  LUTS improved with procedure per urology, assuming he drinks adequate water.  GERD sx generally controlled. He has to watch his diet.  D/w pt.  No blood in stool.     H/o GIB. Off ASA.  Still on PPI.   No recent symptoms.  No black or bloody stools.  Diabetes:  No meds A1c stable, d/w pt.  Continue as is.  Skin tag to left side of face - can get irritated. D/w pt about options/eval here in the clinic. We will set up an office visit to evaluate/potentially remove the skin tag.  DMV disability placard - pt advised to print application and bring by office for PCP review  I will ask Dr. Lorelei Pont about options re: plantar fasciitis.  Prev had injection but still having sx and already doing exercise and has inserts.

## 2018-10-15 DIAGNOSIS — M722 Plantar fascial fibromatosis: Secondary | ICD-10-CM | POA: Insufficient documentation

## 2018-10-15 DIAGNOSIS — Z0000 Encounter for general adult medical examination without abnormal findings: Secondary | ICD-10-CM | POA: Insufficient documentation

## 2018-10-15 NOTE — Assessment & Plan Note (Signed)
Still on flomax.  No ADE on med.  LUTS improved with procedure per urology, assuming he drinks adequate water.  GERD sx generally controlled. He has to watch his diet.  D/w pt.  No blood in stool.

## 2018-10-15 NOTE — Assessment & Plan Note (Signed)
No meds A1c stable, d/w pt.  Continue as is.

## 2018-10-15 NOTE — Assessment & Plan Note (Signed)
Advance directive- wife designated if patient were incapacitated.  

## 2018-10-15 NOTE — Assessment & Plan Note (Signed)
can get irritated. D/w pt about options/eval here in the clinic.

## 2018-10-15 NOTE — Assessment & Plan Note (Signed)
I will ask Dr. Lorelei Pont about options re: plantar fasciitis.  Prev had injection but still having sx and already doing exercise and has inserts.

## 2018-10-15 NOTE — Assessment & Plan Note (Signed)
Off ASA.  Still on PPI.   No recent symptoms.  No black or bloody stools.

## 2018-10-15 NOTE — Assessment & Plan Note (Signed)
No change in meds at this point.  Labs discussed with patient. 

## 2018-10-15 NOTE — Assessment & Plan Note (Signed)
  Flu encouraged Shingles 2013, shingrix out of stock PNA UTD Tetanus 2010 Colonoscopy 2018 PSA wnl.   Advance directive- wife designated if patient were incapacitated.  HCV screening neg, d/w pt.

## 2018-10-16 ENCOUNTER — Encounter: Payer: Self-pay | Admitting: *Deleted

## 2018-10-17 ENCOUNTER — Encounter: Payer: Self-pay | Admitting: Family Medicine

## 2018-10-17 ENCOUNTER — Ambulatory Visit (INDEPENDENT_AMBULATORY_CARE_PROVIDER_SITE_OTHER): Payer: Medicare Other | Admitting: Family Medicine

## 2018-10-17 ENCOUNTER — Other Ambulatory Visit: Payer: Self-pay

## 2018-10-17 VITALS — BP 110/70 | HR 63 | Temp 97.7°F | Ht 70.5 in | Wt 208.0 lb

## 2018-10-17 DIAGNOSIS — L918 Other hypertrophic disorders of the skin: Secondary | ICD-10-CM

## 2018-10-17 DIAGNOSIS — M722 Plantar fascial fibromatosis: Secondary | ICD-10-CM

## 2018-10-17 NOTE — Patient Instructions (Signed)
We'll call about the foot appointment.  The skin tag should blister and then come off.   Use a bandaid if needed.  Take care.  Glad to see you.

## 2018-10-17 NOTE — Progress Notes (Signed)
Parking from prev mailed to patient.  He was asking for ortho input on L plantar fasciitis.  Referral ordered.    Skin tag on the L side of face.  He wanted evaluation and treatment.  See plan below, see exam below.  Lesion on left side of face gets irritated.  Does not bleed.  No ulceration.  Meds, vitals, and allergies reviewed.   ROS: Per HPI unless specifically indicated in ROS section   nad ncat Benign but irritated appearing skin tag noted on the left side of the face, inferior to the orbit, not involving the eyelid.  No ulceration.

## 2018-10-18 DIAGNOSIS — L918 Other hypertrophic disorders of the skin: Secondary | ICD-10-CM | POA: Insufficient documentation

## 2018-10-18 NOTE — Assessment & Plan Note (Signed)
Refer

## 2018-10-18 NOTE — Assessment & Plan Note (Signed)
He agreed to Liq N2 tx L facial irritated skin tag.    Treated x3.  No complications.  Routine post cryotherapy instructions discussed.  He will update me as needed.

## 2018-11-06 DIAGNOSIS — M722 Plantar fascial fibromatosis: Secondary | ICD-10-CM | POA: Diagnosis not present

## 2018-12-20 ENCOUNTER — Encounter: Payer: Self-pay | Admitting: Dietician

## 2018-12-20 ENCOUNTER — Other Ambulatory Visit: Payer: Self-pay

## 2018-12-20 ENCOUNTER — Encounter: Payer: Medicare Other | Attending: Family Medicine | Admitting: Dietician

## 2018-12-20 DIAGNOSIS — E119 Type 2 diabetes mellitus without complications: Secondary | ICD-10-CM

## 2018-12-20 NOTE — Progress Notes (Signed)
Diabetes Self-Management Education  Visit Type: First/Initial  Appt. Start Time: 9:45am Appt. End Time: 10:40am  12/20/2018  [name], identified by name and date of birth, is a 73 y.o. male with a diagnosis of Diabetes: Type 2.   ASSESSMENT  Patient arrived to appointment today by himself. States he lives with his wife who has dementia, so lately he has been learning to do a lot more than he has before and been caring for her. Typically eats at home, especially lately during the coronavirus pandemic. Usual meal pattern is 3 meals plus snacks each day. Did not have specific questions or concerns regarding diabetes. States one of his grandchildren has diabetes, so he is a little familiar with it.   Diabetes Self-Management Education - 12/20/18 0946      Visit Information   Visit Type  First/Initial      Initial Visit   Diabetes Type  Type 2    Are you currently following a meal plan?  No    Are you taking your medications as prescribed?  Not on Medications      Health Coping   How would you rate your overall health?  Good      Psychosocial Assessment   Patient Belief/Attitude about Diabetes  Other (comment)   not sure   Self-care barriers  None    Self-management support  Doctor's office    Other persons present  Patient    Special Needs  None    Preferred Learning Style  No preference indicated    Learning Readiness  Contemplating    How often do you need to have someone help you when you read instructions, pamphlets, or other written materials from your doctor or pharmacy?  1 - Never    What is the last grade level you completed in school?  12th      Pre-Education Assessment   Patient understands the diabetes disease and treatment process.  Needs Instruction    Patient understands incorporating nutritional management into lifestyle.  Needs Instruction    Patient undertands incorporating physical activity into lifestyle.  Needs Instruction    Patient understands  using medications safely.  Needs Instruction    Patient understands monitoring blood glucose, interpreting and using results  Needs Instruction    Patient understands prevention, detection, and treatment of acute complications.  Needs Instruction    Patient understands prevention, detection, and treatment of chronic complications.  Needs Instruction    Patient understands how to develop strategies to address psychosocial issues.  Needs Instruction    Patient understands how to develop strategies to promote health/change behavior.  Needs Instruction      Complications   How often do you check your blood sugar?  0 times/day (not testing)    Have you had a dilated eye exam in the past 12 months?  Yes    Have you had a dental exam in the past 12 months?  Yes    Are you checking your feet?  Yes    How many days per week are you checking your feet?  5      Dietary Intake   Breakfast  oatmeal + banana   eggs 1x/week   Snack (morning)  V8    Lunch  light snack (nabs)    Snack (afternoon)  oatmeal cookie    Dinner  chicken + vegetables   or fish + vegetables   Beverage(s)  water, occasionally tea      Exercise   Exercise  Type  ADL's   plantar facitis limits walking     Patient Education   Previous Diabetes Education  No    Disease state   Definition of diabetes, type 1 and 2, and the diagnosis of diabetes;Factors that contribute to the development of diabetes;Explored patient's options for treatment of their diabetes    Nutrition management   Role of diet in the treatment of diabetes and the relationship between the three main macronutrients and blood glucose level;Carbohydrate counting;Meal options for control of blood glucose level and chronic complications.    Physical activity and exercise   Role of exercise on diabetes management, blood pressure control and cardiac health.;Helped patient identify appropriate exercises in relation to his/her diabetes, diabetes complications and other  health issue.   chair exercises to accommodate foot pain   Monitoring  Interpreting lab values - A1C, lipid, urine microalbumina.;Identified appropriate SMBG and/or A1C goals.;Daily foot exams;Yearly dilated eye exam    Acute complications  Taught treatment of hypoglycemia - the 15 rule.;Discussed and identified patients' treatment of hyperglycemia.    Chronic complications  Relationship between chronic complications and blood glucose control;Assessed and discussed foot care and prevention of foot problems;Lipid levels, blood glucose control and heart disease;Retinopathy and reason for yearly dilated eye exams;Reviewed with patient heart disease, higher risk of, and prevention    Psychosocial adjustment  Helped patient identify a support system for diabetes management;Identified and addressed patients feelings and concerns about diabetes      Individualized Goals (developed by patient)   Nutrition  General guidelines for healthy choices and portions discussed    Physical Activity  Exercise 3-5 times per week    Medications  Not Applicable    Monitoring   Not Applicable      Outcomes   Expected Outcomes  Demonstrated interest in learning. Expect positive outcomes    Future DMSE  PRN    Program Status  Completed       Individualized Plan for Diabetes Self-Management Training:  Learning Objective:  Patient will have a greater understanding of diabetes self-management. Patient education plan is to attend individual and/or group sessions per assessed needs and concerns.  Expected Outcomes:  Demonstrated interest in learning. Expect positive outcomes  Education material provided: ADA - How to Thrive: A Guide for Your Journey with Diabetes, A1C conversion sheet and Meal plan card  If problems or questions, patient to contact team via:  Phone and Email  Future DSME appointment: PRN

## 2019-01-10 DIAGNOSIS — Z23 Encounter for immunization: Secondary | ICD-10-CM | POA: Diagnosis not present

## 2019-01-25 DIAGNOSIS — H01115 Allergic dermatitis of left lower eyelid: Secondary | ICD-10-CM | POA: Diagnosis not present

## 2019-03-29 DIAGNOSIS — N401 Enlarged prostate with lower urinary tract symptoms: Secondary | ICD-10-CM | POA: Diagnosis not present

## 2019-03-29 DIAGNOSIS — N3943 Post-void dribbling: Secondary | ICD-10-CM | POA: Diagnosis not present

## 2019-05-08 ENCOUNTER — Ambulatory Visit: Payer: Medicare Other | Attending: Internal Medicine

## 2019-05-08 DIAGNOSIS — Z20822 Contact with and (suspected) exposure to covid-19: Secondary | ICD-10-CM

## 2019-05-09 LAB — NOVEL CORONAVIRUS, NAA: SARS-CoV-2, NAA: NOT DETECTED

## 2019-07-30 ENCOUNTER — Other Ambulatory Visit: Payer: Self-pay | Admitting: Family Medicine

## 2019-07-30 ENCOUNTER — Other Ambulatory Visit: Payer: Medicare Other

## 2019-07-30 DIAGNOSIS — E119 Type 2 diabetes mellitus without complications: Secondary | ICD-10-CM

## 2019-07-30 DIAGNOSIS — Z125 Encounter for screening for malignant neoplasm of prostate: Secondary | ICD-10-CM

## 2019-07-30 DIAGNOSIS — E7800 Pure hypercholesterolemia, unspecified: Secondary | ICD-10-CM

## 2019-07-31 ENCOUNTER — Ambulatory Visit: Payer: Medicare Other

## 2019-08-06 ENCOUNTER — Ambulatory Visit: Payer: Medicare Other | Admitting: Family Medicine

## 2019-08-28 ENCOUNTER — Encounter: Payer: Self-pay | Admitting: Cardiology

## 2019-08-28 ENCOUNTER — Ambulatory Visit (INDEPENDENT_AMBULATORY_CARE_PROVIDER_SITE_OTHER): Payer: Medicare Other | Admitting: Cardiology

## 2019-08-28 ENCOUNTER — Other Ambulatory Visit: Payer: Self-pay

## 2019-08-28 VITALS — BP 122/92 | HR 56 | Ht 70.5 in | Wt 195.0 lb

## 2019-08-28 DIAGNOSIS — I428 Other cardiomyopathies: Secondary | ICD-10-CM | POA: Diagnosis not present

## 2019-08-28 DIAGNOSIS — I10 Essential (primary) hypertension: Secondary | ICD-10-CM | POA: Diagnosis not present

## 2019-08-28 DIAGNOSIS — R0602 Shortness of breath: Secondary | ICD-10-CM | POA: Diagnosis not present

## 2019-08-28 DIAGNOSIS — E119 Type 2 diabetes mellitus without complications: Secondary | ICD-10-CM | POA: Diagnosis not present

## 2019-08-28 NOTE — Patient Instructions (Signed)
Medication Instructions:  The current medical regimen is effective;  continue present plan and medications.  *If you need a refill on your cardiac medications before your next appointment, please call your pharmacy*  Testing/Procedures: Your physician has requested that you have an echocardiogram. Echocardiography is a painless test that uses sound waves to create images of your heart. It provides your doctor with information about the size and shape of your heart and how well your heart's chambers and valves are working. This procedure takes approximately one hour. There are no restrictions for this procedure.  Follow-Up: At [facility], you and your health needs are our priority.  As part of our continuing mission to provide you with exceptional heart care, we have created designated Provider Care Teams.  These Care Teams include your primary Cardiologist (physician) and Advanced Practice Providers (APPs -  Physician Assistants and Nurse Practitioners) who all work together to provide you with the care you need, when you need it.  We recommend signing up for the patient portal called "MyChart".  Sign up information is provided on this After Visit Summary.  MyChart is used to connect with patients for Virtual Visits (Telemedicine).  Patients are able to view lab/test results, encounter notes, upcoming appointments, etc.  Non-urgent messages can be sent to your provider as well.   To learn more about what you can do with MyChart, go to [email or web address].    Your next appointment:   12 month(s)  The format for your next appointment:   In Person  Provider:   Mark Skains, MD   Thank you for choosing [facility]!!      

## 2019-08-28 NOTE — Progress Notes (Signed)
Cardiology Office Note:    Date:  08/28/2019   ID:  Paul Fry, DOB Sep 13, 1945, MRN [id number]  PCP:  [name], MD  Cardiologist:  Truitt Merle, NP  Electrophysiologist:  None   Referring MD: [name], MD     History of Present Illness:    Paul Fry is a 74 y.o. male here for follow-up of nonischemic cardiomyopathy with most recent ejection fraction normal 50%.  Cardiac catheterization 2010-mild nonobstructive CAD.  EF at the time 30%.  Cardiac MRI in 2016 showed EF of 49%.  Some noncompaction in the apical region.   Prior GI bleed occasional GERD.  Overall doing well without any fevers chills nausea vomiting syncope bleeding. Feels short winded for a few minuties with GERD. Sits down goes away. Feels light headed with it.  Been feeling a little bit more short winded with pushing the lawnmower, carrying boxes for instance.  PMH: 1. GERD 2. Plantar fasciitis 3. Hyperlipidemia 4. Diverticulosis with diverticular bleeding x 2. Stopped ASA. 5. Left heart cath 5/10: EF 55%, 30-40% mid RCA.  6. Cardiomyopathy: Mild. ETT-Myoview (3/13) with EF 47%, no ischemia or infarction. Echo (3/13) with EF 50-55%, mild LVH, mild MR, mild AI. 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  - Echo (2016) with EF 50-55%, mild biatrial enlargement.  7. Type II diabetes  Past Medical History:  Diagnosis Date  . Allergy    SEASONAL  . Anemia   . Arthritis    KNEES  . BPH (benign prostatic hyperplasia)   . Diverticulosis of colon   . Elevated PSA    1.22 on 01-20-2016  . Feeling of incomplete bladder emptying   . GERD (gastroesophageal reflux disease)   . Hiatal hernia   . History of adenomatous polyp of colon    2014 tubular adenoma  . History of chronic gastritis   . History of Helicobacter pylori infection    07/ 2016  . History of lower GI bleeding    07/ 2010 and 12/  2011  diverticular bleed both times  . Hyperlipidemia   . Hypertension   . Lower urinary tract symptoms (LUTS)   . Nonischemic cardiomyopathy ([place])   . Plantar fascia syndrome   . Strains to urinate    INTERMITTANT  . Type 2 diabetes, diet controlled ([place])    ON BORDER LINE,NEVER TOOK MEDICATION.  [name] Wears glasses     Past Surgical History:  Procedure Laterality Date  . APPENDECTOMY  teenager  . CARDIAC CATHETERIZATION  09-10-2008   dr dalton mclean   mild non-obstructive CAD;  30-40% mRCA, 20%pLAD,  ef 55%  . CARDIOVASCULAR STRESS TEST  07/29/2011   Low risk nuclear study w/ no ischemia or infarct/  LVEF 47% and  apical hypokinesis (08-07-2012 cardiac MRI -- EF 49% w/ no hyperenhancement distal septal and apical hypokinesis, mild LAE, mild RVE, mild AR with mild dilated sinus 34mm)  . CATARACT EXTRACTION W/ INTRAOCULAR LENS  IMPLANT, BILATERAL  2016  . COLONOSCOPY  last one 12-21-2012  . CYSTOSCOPY WITH INSERTION OF UROLIFT N/A 06/17/2016   Procedure: CYSTOSCOPY WITH INSERTION OF UROLIFT;  Surgeon: [name], MD;  Location: [facility];  Service: Urology;  Laterality: N/A;  . ESOPHAGOGASTRODUODENOSCOPY  last one 11-22-2014  . TRANSTHORACIC ECHOCARDIOGRAM  09/17/2014   ef 50-55%/  trivial AR, MR and PR/  mild LAE and RAE/  mild TR    Current  Medications: Current Meds  Medication Sig  . ascorbic acid (VITAMIN C) 1000 MG tablet Take 1,000 mg by mouth daily.  [name] atorvastatin (LIPITOR) 20 MG tablet Take 1 tablet (20 mg total) by mouth every morning.  . calcium carbonate (TUMS) 500 MG chewable tablet Chew 1 tablet by mouth as needed for indigestion. Reported on 07/17/2015  . carvedilol (COREG) 3.125 MG tablet Take 1 tablet (3.125 mg total) by mouth 2 (two) times daily.  . cetirizine (ZYRTEC) 10 MG tablet Take 10 mg by mouth daily as needed for allergies.   . fish oil-omega-3 fatty acids 1000 MG capsule Take 1 g by mouth daily.  . fluticasone (FLONASE) 50 MCG/ACT  nasal spray Place 2 sprays into both nostrils daily as needed for allergies or rhinitis.  [name] losartan (COZAAR) 25 MG tablet Take 1 tablet (25 mg total) by mouth every morning.  . Multiple Vitamin (MULTIVITAMIN) tablet Take 1 tablet by mouth daily.  [name] omeprazole (PRILOSEC) 20 MG capsule Take 1 capsule (20 mg total) by mouth 2 (two) times daily.  . tamsulosin (FLOMAX) 0.4 MG CAPS capsule Take 2 capsules (0.8 mg total) by mouth daily.     Allergies:   Aspirin   Social History   Socioeconomic History  . Marital status: Married    Spouse name: Not on file  . Number of children: 0  . Years of education: Not on file  . Highest education level: Not on file  Occupational History  . Occupation: truck Geophysicist/field seismologist- retired    Fish farm manager: RETIRED  Tobacco Use  . Smoking status: Never Smoker  . Smokeless tobacco: Never Used  Substance and Sexual Activity  . Alcohol use: Yes    Comment: rarely drinks beer  . Drug use: No  . Sexual activity: Not Currently  Other Topics Concern  . Not on file  Social History Narrative   Married 2003, no children.    Retired Administrator, gets regular exercise- walking.    Step daughter is MD in [place] Strain:   . Difficulty of Paying Living Expenses:   Food Insecurity:   . Worried About Charity fundraiser in the Last Year:   . Arboriculturist in the Last Year:   Transportation Needs:   . Film/video editor (Medical):   [name] Lack of Transportation (Non-Medical):   Physical Activity:   . Days of Exercise per Week:   . Minutes of Exercise per Session:   Stress:   . Feeling of Stress :   Social Connections:   . Frequency of Communication with Friends and Family:   . Frequency of Social Gatherings with Friends and Family:   . Attends Religious Services:   . Active Member of Clubs or Organizations:   . Attends Archivist Meetings:   [name] Marital Status:      Family History: The patient's  family history includes Colon cancer (age of onset: 61) in his mother; Colon polyps in his brother; Heart disease in his sister; Hypertension in his sister and sister; Kidney disease in his sister; Lung cancer in his brother. There is no history of Diabetes, Depression, Stroke, Alcohol abuse, or Prostate cancer.  ROS:   Please see the history of present illness.     All other systems reviewed and are negative.  EKGs/Labs/Other Studies Reviewed:    The following studies were reviewed today: Prior office notes reviewed, cath note echo  EKG:  EKG is  ordered today.  The ekg ordered today demonstrates sinus bradycardia 56 no other abnormalities.  Recent Labs: No results found for requested labs within last 8760 hours.  Recent Lipid Panel    Component Value Date/Time   CHOL 143 07/27/2018 0857   TRIG 142.0 07/27/2018 0857   HDL 26.70 (L) 07/27/2018 0857   CHOLHDL 5 07/27/2018 0857   VLDL 28.4 07/27/2018 0857   LDLCALC 88 07/27/2018 0857    Physical Exam:    VS:  BP (!) 122/92   Pulse (!) 56   Ht 5' 10.5" (1.791 m)   Wt 195 lb (88.5 kg)   SpO2 97%   BMI 27.58 kg/m     Wt Readings from Last 3 Encounters:  08/28/19 195 lb (88.5 kg)  10/17/18 208 lb (94.3 kg)  10/13/18 210 lb (95.3 kg)     GEN:  Well nourished, well developed in no acute distress HEENT: Normal NECK: No JVD; No carotid bruits LYMPHATICS: No lymphadenopathy CARDIAC: Brady reg,  no murmurs, rubs, gallops RESPIRATORY:  Clear to auscultation without rales, wheezing or rhonchi  ABDOMEN: Soft, non-tender, non-distended MUSCULOSKELETAL:  No edema; No deformity  SKIN: Warm and dry NEUROLOGIC:  Alert and oriented x 3 PSYCHIATRIC:  Normal affect   ASSESSMENT:    1. NICM (nonischemic cardiomyopathy) ([place])   2. Shortness of breath   3. Diabetes mellitus with coincident hypertension (HCC)    PLAN:    In order of problems listed above:  Nonischemic cardiomyopathy -Feeling some shortness of breath with  physical exertion which is a change for him from last year.  We will go ahead and check an echocardiogram to see if there is been any decrease in EF. -Cardiac MRI 2016 EF 49% with possible noncompaction.  This was seen in the apical region.  Continue with both low-dose beta-blocker and angiotensin receptor blocker.  No changes made.  Blood pressure has been slightly low in the past.  Today higher diastolic. -Weight is down.  He does not appear to be fluid overloaded.  Continuing with low-dose carvedilol, angiotensin receptor blocker.  He is not on a diuretic currently.  Diabetes with hypertension -Diet controlled.  Continue with great efforts.  Hyperlipidemia -Atorvastatin 20 mg-LDL 88 hemoglobin 15.3 creatinine 0.9 ALT 33.  Excellent.  Prior GI bleed -Avoiding aspirin.   Medication Adjustments/Labs and Tests Ordered: Current medicines are reviewed at length with the patient today.  Concerns regarding medicines are outlined above.  Orders Placed This Encounter  Procedures  . EKG 12-Lead  . ECHOCARDIOGRAM COMPLETE   No orders of the defined types were placed in this encounter.   Patient Instructions  Medication Instructions:  The current medical regimen is effective;  continue present plan and medications.  *If you need a refill on your cardiac medications before your next appointment, please call your pharmacy*  Testing/Procedures: Your physician has requested that you have an echocardiogram. Echocardiography is a painless test that uses sound waves to create images of your heart. It provides your doctor with information about the size and shape of your heart and how well your heart's chambers and valves are working. This procedure takes approximately one hour. There are no restrictions for this procedure.  Follow-Up: At [facility], you and your health needs are our priority.  As part of our continuing mission to provide you with exceptional heart care, we have created  designated Provider Care Teams.  These Care Teams include your primary Cardiologist (physician) and Advanced Practice Providers (APPs -  Physician  Assistants and Nurse Practitioners) who all work together to provide you with the care you need, when you need it.  We recommend signing up for the patient portal called "MyChart".  Sign up information is provided on this After Visit Summary.  MyChart is used to connect with patients for Virtual Visits (Telemedicine).  Patients are able to view lab/test results, encounter notes, upcoming appointments, etc.  Non-urgent messages can be sent to your provider as well.   To learn more about what you can do with MyChart, go to [email or web address].    Your next appointment:   12 month(s)  The format for your next appointment:   In Person  Provider:   Candee Furbish, MD  Thank you for choosing [facility]!!         Signed, Candee Furbish, MD  08/28/2019 11:04 AM    [place]

## 2019-09-17 ENCOUNTER — Ambulatory Visit (HOSPITAL_COMMUNITY): Payer: Medicare Other | Attending: Cardiology

## 2019-09-17 ENCOUNTER — Other Ambulatory Visit: Payer: Self-pay

## 2019-09-17 DIAGNOSIS — I428 Other cardiomyopathies: Secondary | ICD-10-CM

## 2019-09-17 DIAGNOSIS — R0602 Shortness of breath: Secondary | ICD-10-CM

## 2019-09-17 MED ORDER — PERFLUTREN LIPID MICROSPHERE
1.0000 mL | INTRAVENOUS | Status: AC | PRN
  Administered 2019-09-17: 2 mL via INTRAVENOUS

## 2019-09-19 ENCOUNTER — Telehealth: Payer: Self-pay

## 2019-09-19 NOTE — Telephone Encounter (Signed)
-----   Message from Jerline Pain, MD sent at 09/19/2019  2:07 PM EDT ----- Pump function is stable from prior (EF 45-50% mildly reduced) Reassuring Candee Furbish, MD

## 2019-09-19 NOTE — Telephone Encounter (Signed)
lpmtcb 5/12

## 2019-09-19 NOTE — Telephone Encounter (Signed)
The patient has been notified of the Echo result and verbalized understanding.  All questions (if any) were answered. Frederik Schmidt, RN 09/19/2019 2:24 PM

## 2019-10-02 ENCOUNTER — Other Ambulatory Visit: Payer: Self-pay | Admitting: Family Medicine

## 2019-10-11 DIAGNOSIS — H524 Presbyopia: Secondary | ICD-10-CM | POA: Diagnosis not present

## 2019-10-11 DIAGNOSIS — H52203 Unspecified astigmatism, bilateral: Secondary | ICD-10-CM | POA: Diagnosis not present

## 2019-10-11 DIAGNOSIS — Z961 Presence of intraocular lens: Secondary | ICD-10-CM | POA: Diagnosis not present

## 2019-10-11 DIAGNOSIS — H26493 Other secondary cataract, bilateral: Secondary | ICD-10-CM | POA: Diagnosis not present

## 2019-10-16 ENCOUNTER — Other Ambulatory Visit (INDEPENDENT_AMBULATORY_CARE_PROVIDER_SITE_OTHER): Payer: Medicare Other

## 2019-10-16 ENCOUNTER — Ambulatory Visit (INDEPENDENT_AMBULATORY_CARE_PROVIDER_SITE_OTHER): Payer: Medicare Other

## 2019-10-16 ENCOUNTER — Other Ambulatory Visit: Payer: Self-pay

## 2019-10-16 DIAGNOSIS — E119 Type 2 diabetes mellitus without complications: Secondary | ICD-10-CM

## 2019-10-16 DIAGNOSIS — Z125 Encounter for screening for malignant neoplasm of prostate: Secondary | ICD-10-CM

## 2019-10-16 DIAGNOSIS — Z0000 Encounter for general adult medical examination without abnormal findings: Secondary | ICD-10-CM | POA: Diagnosis not present

## 2019-10-16 LAB — LIPID PANEL
Cholesterol: 120 mg/dL (ref 0–200)
HDL: 26.8 mg/dL — ABNORMAL LOW (ref >39.00)
LDL Cholesterol: 74 mg/dL (ref 0–99)
NonHDL: 93.23
Total CHOL/HDL Ratio: 4
Triglycerides: 95 mg/dL (ref 0.0–149.0)
VLDL: 19 mg/dL (ref 0.0–40.0)

## 2019-10-16 LAB — HEMOGLOBIN A1C: Hgb A1c MFr Bld: 6.8 % — ABNORMAL HIGH (ref 4.6–6.5)

## 2019-10-16 LAB — COMPREHENSIVE METABOLIC PANEL
ALT: 17 U/L (ref 0–53)
AST: 21 U/L (ref 0–37)
Albumin: 4 g/dL (ref 3.5–5.2)
Alkaline Phosphatase: 56 U/L (ref 39–117)
BUN: 16 mg/dL (ref 6–23)
CO2: 31 mEq/L (ref 19–32)
Calcium: 9.1 mg/dL (ref 8.4–10.5)
Chloride: 102 mEq/L (ref 96–112)
Creatinine, Ser: 0.87 mg/dL (ref 0.40–1.50)
GFR: 103.81 mL/min (ref >60.00)
Glucose, Bld: 113 mg/dL — ABNORMAL HIGH (ref 70–99)
Potassium: 4.6 mEq/L (ref 3.5–5.1)
Sodium: 137 mEq/L (ref 135–145)
Total Bilirubin: 0.7 mg/dL (ref 0.2–1.2)
Total Protein: 6.8 g/dL (ref 6.0–8.3)

## 2019-10-16 LAB — PSA, MEDICARE: PSA: 1.06 ng/ml (ref 0.10–4.00)

## 2019-10-16 NOTE — Patient Instructions (Signed)
Paul Fry , Thank you for taking time to come for your Medicare Wellness Visit. I appreciate your ongoing commitment to your health goals. Please review the following plan we discussed and let me know if I can assist you in the future.   Screening recommendations/referrals: Colonoscopy: Up to date, completed 03/14/2017 Recommended yearly ophthalmology/optometry visit for glaucoma screening and checkup Recommended yearly dental visit for hygiene and checkup  Vaccinations: Influenza vaccine: Up to date, completed 01/10/2019 Pneumococcal vaccine: Completed series Tdap vaccine: decline Shingles vaccine: discussed    Advanced directives: Please bring a copy of your POA (Power of Attorney) and/or Living Will to your next appointment.   Conditions/risks identified: diabetes, hypertension, hypercholesterolemia  Next appointment: 10/23/2019 @ 12 pm   Preventive Care 74 Years and Older, Male Preventive care refers to lifestyle choices and visits with your health care provider that can promote health and wellness. What does preventive care include?  A yearly physical exam. This is also called an annual well check.  Dental exams once or twice a year.  Routine eye exams. Ask your health care provider how often you should have your eyes checked.  Personal lifestyle choices, including:  Daily care of your teeth and gums.  Regular physical activity.  Eating a healthy diet.  Avoiding tobacco and drug use.  Limiting alcohol use.  Practicing safe sex.  Taking low doses of aspirin every day.  Taking vitamin and mineral supplements as recommended by your health care provider. What happens during an annual well check? The services and screenings done by your health care provider during your annual well check will depend on your age, overall health, lifestyle risk factors, and family history of disease. Counseling  Your health care provider may ask you questions about your:  Alcohol  use.  Tobacco use.  Drug use.  Emotional well-being.  Home and relationship well-being.  Sexual activity.  Eating habits.  History of falls.  Memory and ability to understand (cognition).  Work and work Statistician. Screening  You may have the following tests or measurements:  Height, weight, and BMI.  Blood pressure.  Lipid and cholesterol levels. These may be checked every 5 years, or more frequently if you are over 66 years old.  Skin check.  Lung cancer screening. You may have this screening every year starting at age 22 if you have a 30-pack-year history of smoking and currently smoke or have quit within the past 15 years.  Fecal occult blood test (FOBT) of the stool. You may have this test every year starting at age 25.  Flexible sigmoidoscopy or colonoscopy. You may have a sigmoidoscopy every 5 years or a colonoscopy every 10 years starting at age 37.  Prostate cancer screening. Recommendations will vary depending on your family history and other risks.  Hepatitis C blood test.  Hepatitis B blood test.  Sexually transmitted disease (STD) testing.  Diabetes screening. This is done by checking your blood sugar (glucose) after you have not eaten for a while (fasting). You may have this done every 1-3 years.  Abdominal aortic aneurysm (AAA) screening. You may need this if you are a current or former smoker.  Osteoporosis. You may be screened starting at age 2 if you are at high risk. Talk with your health care provider about your test results, treatment options, and if necessary, the need for more tests. Vaccines  Your health care provider may recommend certain vaccines, such as:  Influenza vaccine. This is recommended every year.  Tetanus, diphtheria, and  acellular pertussis (Tdap, Td) vaccine. You may need a Td booster every 10 years.  Zoster vaccine. You may need this after age 72.  Pneumococcal 13-valent conjugate (PCV13) vaccine. One dose is  recommended after age 48.  Pneumococcal polysaccharide (PPSV23) vaccine. One dose is recommended after age 17. Talk to your health care provider about which screenings and vaccines you need and how often you need them. This information is not intended to replace advice given to you by your health care provider. Make sure you discuss any questions you have with your health care provider. Document Released: 05/23/2015 Document Revised: 01/14/2016 Document Reviewed: 02/25/2015 Elsevier Interactive Patient Education  2017 [place] Prevention in the Home Falls can cause injuries. They can happen to people of all ages. There are many things you can do to make your home safe and to help prevent falls. What can I do on the outside of my home?  Regularly fix the edges of walkways and driveways and fix any cracks.  Remove anything that might make you trip as you walk through a door, such as a raised step or threshold.  Trim any bushes or trees on the path to your home.  Use bright outdoor lighting.  Clear any walking paths of anything that might make someone trip, such as rocks or tools.  Regularly check to see if handrails are loose or broken. Make sure that both sides of any steps have handrails.  Any raised decks and porches should have guardrails on the edges.  Have any leaves, snow, or ice cleared regularly.  Use sand or salt on walking paths during winter.  Clean up any spills in your garage right away. This includes oil or grease spills. What can I do in the bathroom?  Use night lights.  Install grab bars by the toilet and in the tub and shower. Do not use towel bars as grab bars.  Use non-skid mats or decals in the tub or shower.  If you need to sit down in the shower, use a plastic, non-slip stool.  Keep the floor dry. Clean up any water that spills on the floor as soon as it happens.  Remove soap buildup in the tub or shower regularly.  Attach bath mats  securely with double-sided non-slip rug tape.  Do not have throw rugs and other things on the floor that can make you trip. What can I do in the bedroom?  Use night lights.  Make sure that you have a light by your bed that is easy to reach.  Do not use any sheets or blankets that are too big for your bed. They should not hang down onto the floor.  Have a firm chair that has side arms. You can use this for support while you get dressed.  Do not have throw rugs and other things on the floor that can make you trip. What can I do in the kitchen?  Clean up any spills right away.  Avoid walking on wet floors.  Keep items that you use a lot in easy-to-reach places.  If you need to reach something above you, use a strong step stool that has a grab bar.  Keep electrical cords out of the way.  Do not use floor polish or wax that makes floors slippery. If you must use wax, use non-skid floor wax.  Do not have throw rugs and other things on the floor that can make you trip. What can I do with my stairs?  Do not leave any items on the stairs.  Make sure that there are handrails on both sides of the stairs and use them. Fix handrails that are broken or loose. Make sure that handrails are as long as the stairways.  Check any carpeting to make sure that it is firmly attached to the stairs. Fix any carpet that is loose or worn.  Avoid having throw rugs at the top or bottom of the stairs. If you do have throw rugs, attach them to the floor with carpet tape.  Make sure that you have a light switch at the top of the stairs and the bottom of the stairs. If you do not have them, ask someone to add them for you. What else can I do to help prevent falls?  Wear shoes that:  Do not have high heels.  Have rubber bottoms.  Are comfortable and fit you well.  Are closed at the toe. Do not wear sandals.  If you use a stepladder:  Make sure that it is fully opened. Do not climb a closed  stepladder.  Make sure that both sides of the stepladder are locked into place.  Ask someone to hold it for you, if possible.  Clearly mark and make sure that you can see:  Any grab bars or handrails.  First and last steps.  Where the edge of each step is.  Use tools that help you move around (mobility aids) if they are needed. These include:  Canes.  Walkers.  Scooters.  Crutches.  Turn on the lights when you go into a dark area. Replace any light bulbs as soon as they burn out.  Set up your furniture so you have a clear path. Avoid moving your furniture around.  If any of your floors are uneven, fix them.  If there are any pets around you, be aware of where they are.  Review your medicines with your doctor. Some medicines can make you feel dizzy. This can increase your chance of falling. Ask your doctor what other things that you can do to help prevent falls. This information is not intended to replace advice given to you by your health care provider. Make sure you discuss any questions you have with your health care provider. Document Released: 02/20/2009 Document Revised: 10/02/2015 Document Reviewed: 05/31/2014 Elsevier Interactive Patient Education  2017 [employer].

## 2019-10-16 NOTE — Progress Notes (Signed)
PCP notes:  Health Maintenance: Foot exam- due   Abnormal Screenings: none   Patient concerns: Loose bowel movements   Nurse concerns: none   Next PCP appt.: 10/23/2019 @ 12 pm

## 2019-10-16 NOTE — Progress Notes (Signed)
Subjective:   Paul Fry is a 74 y.o. male who presents for Medicare Annual/Subsequent preventive examination.  Review of Systems: N/A   I connected with the patient today by telephone and verified that I am speaking with the correct person using two identifiers. Location patient: home Location nurse: work Persons participating in the virtual visit: patient, Marine scientist.   I discussed the limitations, risks, security and privacy concerns of performing an evaluation and management service by telephone and the availability of in person appointments. I also discussed with the patient that there may be a patient responsible charge related to this service. The patient expressed understanding and verbally consented to this telephonic visit.    Interactive audio and video telecommunications were attempted between this nurse and patient, however failed, due to patient having technical difficulties OR patient did not have access to video capability.  We continued and completed visit with audio only.     Cardiac Risk Factors include: advanced age (>57men, >20 women);male gender;diabetes mellitus;hypertension;Other (see comment), Risk factor comments: hypercholesterolemia     Objective:    Vitals: There were no vitals taken for this visit.  There is no height or weight on file to calculate BMI.  Advanced Directives 10/16/2019 12/20/2018 10/09/2018 07/22/2017 12/23/2016 12/22/2016 06/17/2016  Does Patient Have a Medical Advance Directive? Yes Yes Yes Yes No No Yes  Type of Paramedic of [place];Living will - [place];Living will [place];Living will - - Living will  Does patient want to make changes to medical advance directive? - No - Patient declined - - - - No - Patient declined  Copy of [place] in Chart? No - copy requested - No - copy requested No - copy requested - - -  Would patient like information on creating a  medical advance directive? - - - - No - Patient declined - -    Tobacco Social History   Tobacco Use  Smoking Status Never Smoker  Smokeless Tobacco Never Used     Counseling given: Not Answered   Clinical Intake:  Pre-visit preparation completed: Yes  Pain : No/denies pain     Nutritional Risks: None Diabetes: Yes CBG done?: No Did pt. bring in CBG monitor from home?: No  How often do you need to have someone help you when you read instructions, pamphlets, or other written materials from your doctor or pharmacy?: 1 - Never What is the last grade level you completed in school?: 12th  Interpreter Needed?: No  Information entered by :: CJohnson, LPN  Past Medical History:  Diagnosis Date  . Allergy    SEASONAL  . Anemia   . Arthritis    KNEES  . BPH (benign prostatic hyperplasia)   . Diverticulosis of colon   . Elevated PSA    1.22 on 01-20-2016  . Feeling of incomplete bladder emptying   . GERD (gastroesophageal reflux disease)   . Hiatal hernia   . History of adenomatous polyp of colon    2014 tubular adenoma  . History of chronic gastritis   . History of Helicobacter pylori infection    07/ 2016  . History of lower GI bleeding    07/ 2010 and 12/ 2011  diverticular bleed both times  . Hyperlipidemia   . Hypertension   . Lower urinary tract symptoms (LUTS)   . Nonischemic cardiomyopathy ([place])   . Plantar fascia syndrome   . Strains to urinate    INTERMITTANT  .  Type 2 diabetes, diet controlled ([place])    ON BORDER LINE,NEVER TOOK MEDICATION.  [name] Wears glasses    Past Surgical History:  Procedure Laterality Date  . APPENDECTOMY  teenager  . CARDIAC CATHETERIZATION  09-10-2008   dr dalton mclean   mild non-obstructive CAD;  30-40% mRCA, 20%pLAD,  ef 55%  . CARDIOVASCULAR STRESS TEST  07/29/2011   Low risk nuclear study w/ no ischemia or infarct/  LVEF 47% and  apical hypokinesis (08-07-2012 cardiac MRI -- EF 49% w/ no hyperenhancement distal septal  and apical hypokinesis, mild LAE, mild RVE, mild AR with mild dilated sinus 80mm)  . CATARACT EXTRACTION W/ INTRAOCULAR LENS  IMPLANT, BILATERAL  2016  . COLONOSCOPY  last one 12-21-2012  . CYSTOSCOPY WITH INSERTION OF UROLIFT N/A 06/17/2016   Procedure: CYSTOSCOPY WITH INSERTION OF UROLIFT;  Surgeon: [name], MD;  Location: [facility];  Service: Urology;  Laterality: N/A;  . ESOPHAGOGASTRODUODENOSCOPY  last one 11-22-2014  . TRANSTHORACIC ECHOCARDIOGRAM  09/17/2014   ef 50-55%/  trivial AR, MR and PR/  mild LAE and RAE/  mild TR   Family History  Problem Relation Age of Onset  . Colon polyps Brother   . Colon cancer Mother 52  . Lung cancer Brother   . Heart disease Sister        MI, pace maker  . Kidney disease Sister        renal failure, nephrectomy- not cancer  . Hypertension Sister   . Hypertension Sister   . Diabetes Neg Hx        DM  . Depression Neg Hx   . Stroke Neg Hx   . Alcohol abuse Neg Hx        also no drug abuse   . Prostate cancer Neg Hx    Social History   Socioeconomic History  . Marital status: Married    Spouse name: Not on file  . Number of children: 0  . Years of education: Not on file  . Highest education level: Not on file  Occupational History  . Occupation: truck Geophysicist/field seismologist- retired    Fish farm manager: RETIRED  Tobacco Use  . Smoking status: Never Smoker  . Smokeless tobacco: Never Used  Substance and Sexual Activity  . Alcohol use: Yes    Comment: rarely drinks beer  . Drug use: No  . Sexual activity: Not Currently  Other Topics Concern  . Not on file  Social History Narrative   Married 2003, no children.    Retired Administrator, gets regular exercise- walking.    Step daughter is MD in [place] Determinants of Health   Financial Resource Strain: Low Risk   . Difficulty of Paying Living Expenses: Not hard at all  Food Insecurity: No Food Insecurity  . Worried About Charity fundraiser in the Last Year: Never  true  . Ran Out of Food in the Last Year: Never true  Transportation Needs: No Transportation Needs  . Lack of Transportation (Medical): No  . Lack of Transportation (Non-Medical): No  Physical Activity: Inactive  . Days of Exercise per Week: 0 days  . Minutes of Exercise per Session: 0 min  Stress: No Stress Concern Present  . Feeling of Stress : Not at all  Social Connections:   . Frequency of Communication with Friends and Family:   . Frequency of Social Gatherings with Friends and Family:   . Attends Religious Services:   . Active Member of  Clubs or Organizations:   . Attends Archivist Meetings:   [name] Marital Status:     Outpatient Encounter Medications as of 10/16/2019  Medication Sig  . ascorbic acid (VITAMIN C) 1000 MG tablet Take 1,000 mg by mouth daily.  [name] atorvastatin (LIPITOR) 20 MG tablet Take 1 tablet (20 mg total) by mouth every morning.  . calcium carbonate (TUMS) 500 MG chewable tablet Chew 1 tablet by mouth as needed for indigestion. Reported on 07/17/2015  . carvedilol (COREG) 3.125 MG tablet Take 1 tablet (3.125 mg total) by mouth 2 (two) times daily.  . cetirizine (ZYRTEC) 10 MG tablet Take 10 mg by mouth daily as needed for allergies.   . fish oil-omega-3 fatty acids 1000 MG capsule Take 1 g by mouth daily.  . fluticasone (FLONASE) 50 MCG/ACT nasal spray Place 2 sprays into both nostrils daily as needed for allergies or rhinitis.  [name] losartan (COZAAR) 25 MG tablet TAKE 1 TABLET (25 MG TOTAL) BY MOUTH EVERY MORNING.  . Multiple Vitamin (MULTIVITAMIN) tablet Take 1 tablet by mouth daily.  [name] omeprazole (PRILOSEC) 20 MG capsule Take 1 capsule (20 mg total) by mouth 2 (two) times daily.  . tamsulosin (FLOMAX) 0.4 MG CAPS capsule Take 2 capsules (0.8 mg total) by mouth daily.   No facility-administered encounter medications on file as of 10/16/2019.    Activities of Daily Living In your present state of health, do you have any difficulty performing the following  activities: 10/16/2019  Hearing? N  Vision? N  Difficulty concentrating or making decisions? N  Walking or climbing stairs? N  Dressing or bathing? N  Doing errands, shopping? N  Preparing Food and eating ? N  Using the Toilet? N  In the past six months, have you accidently leaked urine? N  Do you have problems with loss of bowel control? Y  Comment sometimes  Managing your Medications? N  Managing your Finances? N  Housekeeping or managing your Housekeeping? N  Some recent data might be hidden    Patient Care Team: [name], MD as PCP - General (Family Medicine) Burtis Junes, NP as PCP - Cardiology (Nurse Practitioner)   Assessment:   This is a routine wellness examination for Rodert.  Exercise Activities and Dietary recommendations Current Exercise Habits: The patient does not participate in regular exercise at present, Exercise limited by: None identified  Goals    . Patient Stated     Starting 10/09/2018, I will continue to take medications as prescribed.     . Patient Stated     10/16/2019, I will maintain and continue medications as prescribed.        Fall Risk Fall Risk  10/16/2019 12/20/2018 10/09/2018 07/22/2017 07/27/2016  Falls in the past year? 0 0 0 No No  Number falls in past yr: 0 - - - -  Injury with Fall? 0 - - - -  Risk for fall due to : Medication side effect - - - -  Follow up Falls evaluation completed;Falls prevention discussed - - - -   Is the patient's home free of loose throw rugs in walkways, pet beds, electrical cords, etc?   yes      Grab bars in the bathroom? no      Handrails on the stairs?   yes      Adequate lighting?   yes  Timed Get Up and Go Performed: N/A  Depression Screen PHQ 2/9 Scores 10/16/2019 12/20/2018 10/09/2018 07/22/2017  PHQ -  2 Score 0 0 0 0  PHQ- 9 Score 0 - 0 0    Cognitive Function MMSE - Mini Mental State Exam 10/16/2019 10/09/2018 07/22/2017  Orientation to time 5 5 5   Orientation to Place 5 5 5   Registration 3 3  3   Attention/ Calculation 5 0 0  Recall 3 3 3   Language- name 2 objects - 0 0  Language- repeat 1 1 1   Language- follow 3 step command - 0 3  Language- read & follow direction - 0 0  Write a sentence - 0 0  Copy design - 0 0  Total score - 17 20  Mini Cog  Mini-Cog screen was completed. Maximum score is 22. A value of 0 denotes this part of the MMSE was not completed or the patient failed this part of the Mini-Cog screening.       Immunization History  Administered Date(s) Administered  . Hepatitis A, Adult 11/07/2014  . Influenza Split 05/25/2011  . Influenza Whole 04/25/2010  . Influenza, Seasonal, Injecte, Preservative Fre 05/25/2012  . Influenza,inj,Quad PF,6+ Mos 02/26/2014, 02/05/2016, 01/31/2017, 02/23/2018  . Pneumococcal Conjugate-13 07/16/2014  . Pneumococcal Polysaccharide-23 04/25/2010, 07/17/2015  . Td 05/11/1998, 09/18/2008  . Zoster 06/17/2011    Qualifies for Shingles Vaccine: Yes  Screening Tests Health Maintenance  Topic Date Due  . FOOT EXAM  Never done  . COVID-19 Vaccine (1) Never done  . HEMOGLOBIN A1C  04/10/2019  . TETANUS/TDAP  05/09/2020 (Originally 09/19/2018)  . INFLUENZA VACCINE  12/09/2019  . OPHTHALMOLOGY EXAM  10/10/2020  . COLONOSCOPY  03/14/2022  . Hepatitis C Screening  Completed  . PNA vac Low Risk Adult  Completed   Cancer Screenings: Lung: Low Dose CT Chest recommended if Age 93-80 years, 30 pack-year currently smoking OR have quit w/in 15 years. Patient does not qualify. Colorectal: completed 03/14/2017  Additional Screenings:  Hepatitis C Screening: 07/19/2016      Plan:    Patient will maintain and continue medications as prescribed.   I have personally reviewed and noted the following in the patient's chart:   . Medical and social history . Use of alcohol, tobacco or illicit drugs  . Current medications and supplements . Functional ability and status . Nutritional status . Physical activity . Advanced  directives . List of other physicians . Hospitalizations, surgeries, and ER visits in previous 12 months . Vitals . Screenings to include cognitive, depression, and falls . Referrals and appointments  In addition, I have reviewed and discussed with patient certain preventive protocols, quality metrics, and best practice recommendations. A written personalized care plan for preventive services as well as general preventive health recommendations were provided to patient.     Andrez Grime, LPN  [id number]

## 2019-10-23 ENCOUNTER — Ambulatory Visit (INDEPENDENT_AMBULATORY_CARE_PROVIDER_SITE_OTHER): Payer: Medicare Other | Admitting: Family Medicine

## 2019-10-23 ENCOUNTER — Encounter: Payer: Self-pay | Admitting: Family Medicine

## 2019-10-23 ENCOUNTER — Other Ambulatory Visit: Payer: Self-pay

## 2019-10-23 VITALS — BP 122/60 | HR 64 | Temp 97.0°F | Ht 70.5 in | Wt 192.6 lb

## 2019-10-23 DIAGNOSIS — E7800 Pure hypercholesterolemia, unspecified: Secondary | ICD-10-CM | POA: Diagnosis not present

## 2019-10-23 DIAGNOSIS — I10 Essential (primary) hypertension: Secondary | ICD-10-CM

## 2019-10-23 DIAGNOSIS — Z7189 Other specified counseling: Secondary | ICD-10-CM | POA: Diagnosis not present

## 2019-10-23 DIAGNOSIS — M6289 Other specified disorders of muscle: Secondary | ICD-10-CM

## 2019-10-23 DIAGNOSIS — I429 Cardiomyopathy, unspecified: Secondary | ICD-10-CM

## 2019-10-23 DIAGNOSIS — E119 Type 2 diabetes mellitus without complications: Secondary | ICD-10-CM

## 2019-10-23 DIAGNOSIS — Z0000 Encounter for general adult medical examination without abnormal findings: Secondary | ICD-10-CM

## 2019-10-23 DIAGNOSIS — K219 Gastro-esophageal reflux disease without esophagitis: Secondary | ICD-10-CM

## 2019-10-23 DIAGNOSIS — Z636 Dependent relative needing care at home: Secondary | ICD-10-CM

## 2019-10-23 MED ORDER — TAMSULOSIN HCL 0.4 MG PO CAPS: 0.8000 mg | ORAL_CAPSULE | Freq: Every day | ORAL | 3 refills | Status: DC

## 2019-10-23 MED ORDER — CARVEDILOL 3.125 MG PO TABS: 3.1250 mg | ORAL_TABLET | Freq: Two times a day (BID) | ORAL | 3 refills | Status: DC

## 2019-10-23 MED ORDER — OMEPRAZOLE 20 MG PO CPDR: 20.0000 mg | DELAYED_RELEASE_CAPSULE | Freq: Two times a day (BID) | ORAL | 3 refills | Status: DC

## 2019-10-23 MED ORDER — LOSARTAN POTASSIUM 25 MG PO TABS: 25.0000 mg | ORAL_TABLET | Freq: Every morning | ORAL | 0 refills | Status: DC

## 2019-10-23 MED ORDER — ATORVASTATIN CALCIUM 20 MG PO TABS: 20.0000 mg | ORAL_TABLET | Freq: Every morning | ORAL | 3 refills | Status: DC

## 2019-10-23 NOTE — Assessment & Plan Note (Signed)
Advance directive- niece Janice Borntreger designated if patient were incapacitated.  

## 2019-10-23 NOTE — Patient Instructions (Addendum)
Start doing kegel exercises and let me know if that isn't helping.  I will update cardiology in the meantime.   Don't change your meds for now.  Take care.  Glad to see you.

## 2019-10-23 NOTE — Progress Notes (Signed)
This visit occurred during the SARS-CoV-2 public health emergency.  Safety protocols were in place, including screening questions prior to the visit, additional usage of staff PPE, and extensive cleaning of exam room while observing appropriate contact time as indicated for disinfecting solutions.  Flu encouraged Shingles 2013, shingrix d/w pt.   PNA UTD Tetanus 2010, dw pt.   covid vaccine moderna 2021 Colonoscopy 2018 PSA wnl.  Advance directive- niece Paul Fry designated if patient were incapacitated.   He has a strain caring for his wife. He has some help with a caregiver.  His wife has memory loss, esp short term.  She doesn't recognize some of her kids initially.  "I'm doing the best I can."  He can manage as is for now.    Loose BMs. He has used prep H occ.  occ leakage of small amount of stool.  No blood in stool.  D/w pt about kegel exercises.    Elevated Cholesterol: Using medications without problems: yes Muscle aches: no Diet compliance: diet changed with covid, not eating at restaurants Exercise: d/w pt.  Walking.   Lipitor.   Hypertension:  Using medication without problems or lightheadedness: yes Chest pain with exertion: see below re: GERD sx.  He also has exertional episodes where resting helps.   Edema:no Short of breath:occ, he attributed to GERD sx, ie when supine.  Not eating right before bed helps.  Losartan, carvedilol.   He had echo in the meantime after seeing cardiology.   Sx are not worse in the meantime since cardiology eval.   Dm2.  No meds.  A1c d/w pt. Labs d/w pt.  Not checking sugar at home.  Weight loss with diet changes.    Taking flomax prn.  Used prn.    PMH and SH reviewed  Meds, vitals, and allergies reviewed.   ROS: Per HPI unless specifically indicated in ROS section   GEN: nad, alert and oriented HEENT: ncat NECK: supple w/o LA CV: rrr. PULM: ctab, no inc wob ABD: soft, +bs EXT: no edema SKIN: no acute rash  Diabetic  foot exam: Normal inspection No skin breakdown No calluses  Normal DP pulses Normal sensation to light touch and monofilament Nails normal

## 2019-10-24 DIAGNOSIS — M6289 Other specified disorders of muscle: Secondary | ICD-10-CM | POA: Insufficient documentation

## 2019-10-24 DIAGNOSIS — Z636 Dependent relative needing care at home: Secondary | ICD-10-CM | POA: Insufficient documentation

## 2019-10-24 NOTE — Assessment & Plan Note (Signed)
Flu encouraged Shingles 2013, shingrix d/w pt.   PNA UTD Tetanus 2010, dw pt.   covid vaccine moderna 2021 Colonoscopy 2018 PSA wnl.  Advance directive- niece Contrell Ballentine designated if patient were incapacitated.

## 2019-10-24 NOTE — Assessment & Plan Note (Signed)
No other neurologic symptoms.  Discussed with patient about Kegel exercises and he will update me as needed.  He agrees.

## 2019-10-24 NOTE — Assessment & Plan Note (Signed)
Still some shortness of breath that he attributed to GERD symptoms when supine.  However he did not improve with omeprazole twice daily.  He has also had some exertional episodes of chest pain where resting would help.  Continue losartan and carvedilol for now.  I will update cardiology about his situation.

## 2019-10-24 NOTE — Assessment & Plan Note (Signed)
Diabetic but not in need of medication at this point.  Discussed recent labs and options.  Continue work on diet and exercise.  He agrees.  Recheck periodically.

## 2019-10-24 NOTE — Assessment & Plan Note (Signed)
Continue work on diet and exercise.  He will update me as needed.  Continue Lipitor.  He agrees.

## 2019-10-24 NOTE — Assessment & Plan Note (Signed)
He has a strain caring for his wife. He has some help with a caregiver.  His wife has memory loss, esp short term.  She doesn't recognize some of her kids initially.  "I'm doing the best I can."  He can manage as is for now.    Support offered.  He will update me as needed.

## 2019-10-26 ENCOUNTER — Telehealth: Payer: Self-pay | Admitting: Cardiology

## 2019-10-26 NOTE — Telephone Encounter (Signed)
Appreciate cards input.

## 2019-10-26 NOTE — Telephone Encounter (Signed)
Pt aware of recommendations and appt made with Cecilie Kicks NP on 11/15/19 at 11:15 am  and informed pt if S/S worsen or increase to go to ED for eval and tx the patient verbalizes understanding ./cy

## 2019-10-26 NOTE — Telephone Encounter (Addendum)
Please have him come in to see me or APP to get set up for coronary CTA with possible FFR.   We will get him in.   Thanks  Candee Furbish, MD   ----- Message from [name], MD sent at 10/24/2019 11:11 PM EDT ----- I think you may need to see this gentleman again.  He had some shortness of breath that he attributed to GERD but he is not better on twice daily PPI.  He is also had some exertional chest discomfort.  I think he may need more invasive/extensive cardiovascular testing.  I would like your input.  I am always grateful for your help.  Take care.  Brigitte Pulse

## 2019-11-14 NOTE — Progress Notes (Signed)
Cardiology Office Note   Date:  11/15/2019   ID:  Paul Fry, DOB 01/23/1946, MRN [id number]  PCP:  [name], MD  Cardiologist:  Dr. [name]    Chief Complaint  Patient presents with  . Cardiomyopathy      History of Present Illness: Paul Fry is a 74 y.o. male who presents for follow up echo  nonischemic cardiomyopathy with most recent ejection fraction normal 50%.  Cardiac catheterization 2010-mild nonobstructive CAD.  EF at the time 30%.  Cardiac MRI in 2016 showed EF of 49%.  Some noncompaction in the apical region.   Prior GI bleed occasional GERD.  Overall doing well without any fevers chills nausea vomiting syncope bleeding. Feels short winded for a few minuties with GERD. Sits down goes away. Feels light headed with it.  Been feeling a little bit more short winded with pushing the lawnmower, carrying boxes for instance. Echo done in April EF was 45-50% stable.   Labs from PCP reviewed and stable.  LDL 74  Today no chest pain and no SOB, he is active in yard care has self propelled mower and week eating are most strenuous activities.   He feels good.  We reviewed echo  Past Medical History:  Diagnosis Date  . Allergy    SEASONAL  . Anemia   . Arthritis    KNEES  . BPH (benign prostatic hyperplasia)   . Diverticulosis of colon   . Elevated PSA    1.22 on 01-20-2016  . Feeling of incomplete bladder emptying   . GERD (gastroesophageal reflux disease)   . Hiatal hernia   . History of adenomatous polyp of colon    2014 tubular adenoma  . History of chronic gastritis   . History of Helicobacter pylori infection    07/ 2016  . History of lower GI bleeding    07/ 2010 and 12/ 2011  diverticular bleed both times  . Hyperlipidemia   . Hypertension   . Lower urinary tract symptoms (LUTS)   . Nonischemic cardiomyopathy ([place])   . Plantar fascia syndrome   . Strains to urinate    INTERMITTANT  . Type 2 diabetes, diet controlled ([place])     ON BORDER LINE,NEVER TOOK MEDICATION.  [name] Wears glasses     Past Surgical History:  Procedure Laterality Date  . APPENDECTOMY  teenager  . CARDIAC CATHETERIZATION  09-10-2008   dr dalton mclean   mild non-obstructive CAD;  30-40% mRCA, 20%pLAD,  ef 55%  . CARDIOVASCULAR STRESS TEST  07/29/2011   Low risk nuclear study w/ no ischemia or infarct/  LVEF 47% and  apical hypokinesis (08-07-2012 cardiac MRI -- EF 49% w/ no hyperenhancement distal septal and apical hypokinesis, mild LAE, mild RVE, mild AR with mild dilated sinus 43mm)  . CATARACT EXTRACTION W/ INTRAOCULAR LENS  IMPLANT, BILATERAL  2016  . COLONOSCOPY  last one 12-21-2012  . CYSTOSCOPY WITH INSERTION OF UROLIFT N/A 06/17/2016   Procedure: CYSTOSCOPY WITH INSERTION OF UROLIFT;  Surgeon: [name], MD;  Location: [facility];  Service: Urology;  Laterality: N/A;  . ESOPHAGOGASTRODUODENOSCOPY  last one 11-22-2014  . TRANSTHORACIC ECHOCARDIOGRAM  09/17/2014   ef 50-55%/  trivial AR, MR and PR/  mild LAE and RAE/  mild TR     Current Outpatient Medications  Medication Sig Dispense Refill  . ascorbic acid (VITAMIN C) 1000 MG tablet Take 1,000 mg by mouth daily.    [name] atorvastatin (LIPITOR) 20 MG tablet  Take 1 tablet (20 mg total) by mouth every morning. 90 tablet 3  . calcium carbonate (TUMS) 500 MG chewable tablet Chew 1 tablet by mouth as needed for indigestion. Reported on 07/17/2015    . carvedilol (COREG) 3.125 MG tablet Take 1 tablet (3.125 mg total) by mouth 2 (two) times daily. 180 tablet 3  . cetirizine (ZYRTEC) 10 MG tablet Take 10 mg by mouth daily as needed for allergies.     . fish oil-omega-3 fatty acids 1000 MG capsule Take 1 g by mouth daily.    . fluticasone (FLONASE) 50 MCG/ACT nasal spray Place 2 sprays into both nostrils daily as needed for allergies or rhinitis.    [name] losartan (COZAAR) 25 MG tablet Take 1 tablet (25 mg total) by mouth every morning. 90 tablet 0  . Multiple Vitamin (MULTIVITAMIN)  tablet Take 1 tablet by mouth daily.    [name] omeprazole (PRILOSEC) 20 MG capsule Take 1 capsule (20 mg total) by mouth 2 (two) times daily. 180 capsule 3  . tamsulosin (FLOMAX) 0.4 MG CAPS capsule Take 2 capsules (0.8 mg total) by mouth daily. 180 capsule 3   No current facility-administered medications for this visit.    Allergies:   Aspirin    Social History:  The patient  reports that he has never smoked. He has never used smokeless tobacco. He reports current alcohol use. He reports that he does not use drugs.   Family History:  The patient's family history includes Colon cancer (age of onset: 23) in his mother; Colon polyps in his brother; Heart disease in his sister; Hypertension in his sister and sister; Kidney disease in his sister; Lung cancer in his brother.    ROS:  General:no colds or fevers, + weight decrease over 1 year 16 lbs Skin:no rashes or ulcers HEENT:no blurred vision, no congestion CV:see HPI PUL:see HPI GI:no diarrhea constipation or melena, no indigestion GU:no hematuria, no dysuria MS:no joint pain, no claudication Neuro:no syncope, no lightheadedness Endo:+ diabetes, no thyroid disease  Wt Readings from Last 3 Encounters:  11/15/19 195 lb (88.5 kg)  10/23/19 192 lb 9 oz (87.3 kg)  08/28/19 195 lb (88.5 kg)     PHYSICAL EXAM: VS:  BP 122/62   Pulse 63   Ht 5' 10.5" (1.791 m)   Wt 195 lb (88.5 kg)   SpO2 98%   BMI 27.58 kg/m  , BMI Body mass index is 27.58 kg/m. General:Pleasant affect, NAD Skin:Warm and dry, brisk capillary refill HEENT:normocephalic, sclera clear, mucus membranes moist Neck:supple, no JVD, no bruits  Heart:S1S2 RRR without murmur, gallup, rub or click Lungs:clear without rales, rhonchi, or wheezes IDP:OEUM, non tender, + BS, do not palpate liver spleen or masses Ext:no lower ext edema, 2+ pedal pulses, 2+ radial pulses Neuro:alert and oriented X 3, MAE, follows commands, + facial symmetry    EKG:  EKG is NOT  ordered  today.    Recent Labs: 10/16/2019: ALT 17; BUN 16; Creatinine, Ser 0.87; Potassium 4.6; Sodium 137    Lipid Panel    Component Value Date/Time   CHOL 120 10/16/2019 0943   TRIG 95.0 10/16/2019 0943   HDL 26.80 (L) 10/16/2019 0943   CHOLHDL 4 10/16/2019 0943   VLDL 19.0 10/16/2019 0943   LDLCALC 74 10/16/2019 0943       Other studies Reviewed: Additional studies/ records that were reviewed today include: . Echo 09/17/19 IMPRESSIONS    1. Left ventricular ejection fraction, by estimation, is 45 to 50%. The  left ventricle has mildly decreased function. The left ventricle  demonstrates global hypokinesis. There is mild concentric left ventricular  hypertrophy. Left ventricular diastolic  parameters are indeterminate.  2. Right ventricular systolic function is normal. The right ventricular  size is normal. There is normal pulmonary artery systolic pressure. The  estimated right ventricular systolic pressure is 00.9 mmHg.  3. The mitral valve is normal in structure. Mild mitral valve  regurgitation. No evidence of mitral stenosis.  4. The aortic valve is tricuspid. Aortic valve regurgitation is mild.  Mild aortic valve sclerosis is present, with no evidence of aortic valve  stenosis.  5. Aortic dilatation noted. There is mild dilatation of the ascending  aorta measuring 38 mm.  6. The inferior vena cava is normal in size with greater than 50%  respiratory variability, suggesting right atrial pressure of 3 mmHg.   FINDINGS  Left Ventricle: Left ventricular ejection fraction, by estimation, is 45  to 50%. The left ventricle has mildly decreased function. The left  ventricle demonstrates global hypokinesis. The left ventricular internal  cavity size was normal in size. There is  mild concentric left ventricular hypertrophy. Left ventricular diastolic  parameters are indeterminate.   Right Ventricle: The right ventricular size is normal. No increase in  right  ventricular wall thickness. Right ventricular systolic function is  normal. There is normal pulmonary artery systolic pressure. The tricuspid  regurgitant velocity is 2.36 m/s, and  with an assumed right atrial pressure of 3 mmHg, the estimated right  ventricular systolic pressure is 38.1 mmHg.   Left Atrium: Left atrial size was normal in size.   Right Atrium: Right atrial size was normal in size.   Pericardium: There is no evidence of pericardial effusion.   Mitral Valve: The mitral valve is normal in structure. Normal mobility of  the mitral valve leaflets. Mild mitral valve regurgitation. No evidence of  mitral valve stenosis.   Tricuspid Valve: The tricuspid valve is normal in structure. Tricuspid  valve regurgitation is mild . No evidence of tricuspid stenosis.   Aortic Valve: The aortic valve is tricuspid. Aortic valve regurgitation is  mild. Aortic regurgitation PHT measures 687 msec. Mild aortic valve  sclerosis is present, with no evidence of aortic valve stenosis.   Pulmonic Valve: The pulmonic valve was normal in structure. Pulmonic valve  regurgitation is trivial. No evidence of pulmonic stenosis.   Aorta: Aortic dilatation noted. There is mild dilatation of the ascending  aorta measuring 38 mm.   Venous: The inferior vena cava is normal in size with greater than 50%  respiratory variability, suggesting right atrial pressure of 3 mmHg.   IAS/Shunts: No atrial level shunt detected by color flow Doppler.   ASSESSMENT AND PLAN:  1.  NICM with recent SOB which pt attributes to his GERD.  His Echo with stable EF.  Today he feels fine, surprised to be back.  No   2.  HTN controlled  3.  HLD with last LDL in 10/2019 74 from PCP office.   4.  DM stable followed by PCP and A1C of 6.8   6.  Prior GI bleed no ASA   7.  CAD with mild nonobstructive disease with cath 2010 neg myoview 2013 and no angina  Follow up in 1 year unless increase of SOB or chest pain.  Pt  agreeable.    Current medicines are reviewed with the patient today.  The patient Has no concerns regarding medicines.  The following changes have been made:  See above Labs/ tests ordered today include:see above  Disposition:   FU:  see above  Signed, Cecilie Kicks, NP  11/15/2019 11:42 AM    [address] [place], [place], [place], [place] Phone: [phone]; Fax: [phone]

## 2019-11-15 ENCOUNTER — Encounter: Payer: Self-pay | Admitting: Cardiology

## 2019-11-15 ENCOUNTER — Other Ambulatory Visit: Payer: Self-pay

## 2019-11-15 ENCOUNTER — Ambulatory Visit (INDEPENDENT_AMBULATORY_CARE_PROVIDER_SITE_OTHER): Payer: Medicare Other | Admitting: Cardiology

## 2019-11-15 VITALS — BP 122/62 | HR 63 | Ht 70.5 in | Wt 195.0 lb

## 2019-11-15 DIAGNOSIS — I2510 Atherosclerotic heart disease of native coronary artery without angina pectoris: Secondary | ICD-10-CM

## 2019-11-15 DIAGNOSIS — I10 Essential (primary) hypertension: Secondary | ICD-10-CM | POA: Diagnosis not present

## 2019-11-15 DIAGNOSIS — E119 Type 2 diabetes mellitus without complications: Secondary | ICD-10-CM

## 2019-11-15 DIAGNOSIS — E7800 Pure hypercholesterolemia, unspecified: Secondary | ICD-10-CM

## 2019-11-15 DIAGNOSIS — I428 Other cardiomyopathies: Secondary | ICD-10-CM | POA: Diagnosis not present

## 2019-11-15 NOTE — Patient Instructions (Signed)
Medication Instructions:  Your physician recommends that you continue on your current medications as directed. Please refer to the Current Medication list given to you today.  *If you need a refill on your cardiac medications before your next appointment, please call your pharmacy*   Lab Work: None If you have labs (blood work) drawn today and your tests are completely normal, you will receive your results only by: [name] MyChart Message (if you have MyChart) OR . A paper copy in the mail If you have any lab test that is abnormal or we need to change your treatment, we will call you to review the results.   Testing/Procedures: None   Follow-Up: At [facility], you and your health needs are our priority.  As part of our continuing mission to provide you with exceptional heart care, we have created designated Provider Care Teams.  These Care Teams include your primary Cardiologist (physician) and Advanced Practice Providers (APPs -  Physician Assistants and Nurse Practitioners) who all work together to provide you with the care you need, when you need it.  We recommend signing up for the patient portal called "MyChart".  Sign up information is provided on this After Visit Summary.  MyChart is used to connect with patients for Virtual Visits (Telemedicine).  Patients are able to view lab/test results, encounter notes, upcoming appointments, etc.  Non-urgent messages can be sent to your provider as well.   To learn more about what you can do with MyChart, go to [email or web address].    Your next appointment:   12 month(s)  The format for your next appointment:   In Person  Provider:   You may see Dr [name] or one of the following Advanced Practice Providers on your designated Care Team:    Truitt Merle, NP  Cecilie Kicks, NP  Kathyrn Drown, NP    Other Instructions None

## 2020-02-18 ENCOUNTER — Ambulatory Visit (INDEPENDENT_AMBULATORY_CARE_PROVIDER_SITE_OTHER): Payer: Medicare Other

## 2020-02-18 ENCOUNTER — Other Ambulatory Visit: Payer: Self-pay

## 2020-02-18 DIAGNOSIS — Z23 Encounter for immunization: Secondary | ICD-10-CM | POA: Diagnosis not present

## 2020-02-18 NOTE — Progress Notes (Signed)
Per orders of Wilfred Lacy, NP, injection Influenza  given by Armandina Gemma, cma.  Patient tolerated injection well.

## 2020-03-28 DIAGNOSIS — R3912 Poor urinary stream: Secondary | ICD-10-CM | POA: Diagnosis not present

## 2020-03-28 DIAGNOSIS — N401 Enlarged prostate with lower urinary tract symptoms: Secondary | ICD-10-CM | POA: Diagnosis not present

## 2020-03-28 DIAGNOSIS — N3943 Post-void dribbling: Secondary | ICD-10-CM | POA: Diagnosis not present

## 2020-03-30 DIAGNOSIS — Z23 Encounter for immunization: Secondary | ICD-10-CM | POA: Diagnosis not present

## 2020-04-08 ENCOUNTER — Other Ambulatory Visit: Payer: Self-pay | Admitting: Family Medicine

## 2020-04-21 ENCOUNTER — Other Ambulatory Visit: Payer: Self-pay

## 2020-04-21 ENCOUNTER — Ambulatory Visit (INDEPENDENT_AMBULATORY_CARE_PROVIDER_SITE_OTHER): Payer: Medicare Other | Admitting: Family Medicine

## 2020-04-21 VITALS — BP 120/80 | HR 74 | Temp 97.0°F | Ht 72.0 in | Wt 197.0 lb

## 2020-04-21 DIAGNOSIS — R159 Full incontinence of feces: Secondary | ICD-10-CM

## 2020-04-21 DIAGNOSIS — Z636 Dependent relative needing care at home: Secondary | ICD-10-CM

## 2020-04-21 DIAGNOSIS — E119 Type 2 diabetes mellitus without complications: Secondary | ICD-10-CM

## 2020-04-21 DIAGNOSIS — I2510 Atherosclerotic heart disease of native coronary artery without angina pectoris: Secondary | ICD-10-CM

## 2020-04-21 LAB — POCT GLYCOSYLATED HEMOGLOBIN (HGB A1C): Hemoglobin A1C: 6.3 % — AB (ref 4.0–5.6)

## 2020-04-21 NOTE — Patient Instructions (Addendum)
Recheck at a physical as scheduled.   Your A1c was good.  Work on Kegel exercises in the meantime.  If that isn't helping, then let know.  We can set you up with the GI clinic if needed.   Take care.  Glad to see you.

## 2020-04-21 NOTE — Progress Notes (Signed)
This visit occurred during the SARS-CoV-2 public health emergency.  Safety protocols were in place, including screening questions prior to the visit, additional usage of staff PPE, and extensive cleaning of exam room while observing appropriate contact time as indicated for disinfecting solutions.  Less control with BMs over the last few months, some leakage of stool occ.  Noted more with bending over, "especially if I don't catch myself."  No bleeding, no pain. No dysuria.  No FCNAVD.  BMs are still solid.  No black stools.  No new meds.  Doesn't feel ill o/w.  Normal sensation and motor function in the legs.  No straining with BMs.  No urinary symptoms.  No urinary leakage.  H/o DM2.  No meds.  Due for A1c, dw pt. A1c done at office visit.  No foot tingling.  Not checking sugar at home.    Wife is in memory care facility.  That is "better for her" and it helped him.  Condolences offered.  He has family support and other family members agreed with placement.    Meds, vitals, and allergies reviewed.   ROS: Per HPI unless specifically indicated in ROS section   GEN: nad, alert and oriented HEENT: ncat NECK: supple w/o LA CV: rrr.  PULM: ctab, no inc wob ABD: soft, +bs EXT: no edema SKIN: Well-perfused External rectal exam without gross blood or mass or hemorrhoid.

## 2020-04-23 DIAGNOSIS — R159 Full incontinence of feces: Secondary | ICD-10-CM | POA: Insufficient documentation

## 2020-04-23 NOTE — Assessment & Plan Note (Signed)
A1c controlled.  No meds.  Continue work on diet and exercise.  Recheck periodically.

## 2020-04-23 NOTE — Assessment & Plan Note (Signed)
See above.  Wife was placed in memory care.  He will update me as needed.  Support offered.

## 2020-04-23 NOTE — Assessment & Plan Note (Signed)
He says that he is having occasional stool leakage that is stress related i.e. with bending over.  Discussed using Kegel exercises and if he is not better we can refer him over to PT/GI.  Pelvic floor anatomy discussed with patient.  Still okay for outpatient follow-up.  He agrees with plan.

## 2020-07-18 ENCOUNTER — Telehealth: Payer: Self-pay | Admitting: Family Medicine

## 2020-07-18 NOTE — Chronic Care Management (AMB) (Signed)
  Chronic Care Management   Note  07/18/2020 Name: ERIKA HUSSAR MRN: [id number] DOB: Jul 30, 1945  Lenor Derrick Earll is a 75 y.o. year old male who is a primary care patient of [name], MD. I reached out to [employer] by phone today in response to a referral sent by Mr. Kaydan Wong Sanz's PCP, [name], MD.   Mr. Frei was given information about Chronic Care Management services today including:  1. CCM service includes personalized support from designated clinical staff supervised by his physician, including individualized plan of care and coordination with other care providers 2. 24/7 contact phone numbers for assistance for urgent and routine care needs. 3. Service will only be billed when office clinical staff spend 20 minutes or more in a month to coordinate care. 4. Only one practitioner may furnish and bill the service in a calendar month. 5. The patient may stop CCM services at any time (effective at the end of the month) by phone call to the office staff.   Patient agreed to services and verbal consent obtained.   Follow up plan:   Lauretta Grill [employer] Scheduler

## 2020-08-12 ENCOUNTER — Other Ambulatory Visit: Payer: Self-pay | Admitting: Family Medicine

## 2020-08-25 ENCOUNTER — Telehealth: Payer: Self-pay

## 2020-08-25 NOTE — Chronic Care Management (AMB) (Addendum)
Chronic Care Management Pharmacy Assistant   Name: Paul Fry  MRN: [id number] DOB: 22-Jul-1945  Paul Fry is an 75 y.o. year old male who presents for his initial CCM visit with the clinical pharmacist.  Reason for Encounter: Initial Questions and Chart Review for CCM Appointment 09/01/20   Conditions to be addressed/monitored: CAD, HTN, DMII and BPH, Hypercholesteremia  Recent office visits:  04/21/20- Dr. Elsie Stain- PCP. Ordered A1C. Referred to PT/ GI for stool leakage.   Recent consult visits:  03/28/20- Dr. Ellison Hughs- Urology- Follow up in 1 year.  Hospital visits:  None in previous 6 months  Medications: Outpatient Encounter Medications as of 08/25/2020  Medication Sig   ascorbic acid (VITAMIN C) 1000 MG tablet Take 1,000 mg by mouth daily.   atorvastatin (LIPITOR) 20 MG tablet Take 1 tablet (20 mg total) by mouth every morning.   calcium carbonate (TUMS - DOSED IN MG ELEMENTAL CALCIUM) 500 MG chewable tablet Chew 1 tablet by mouth as needed for indigestion. Reported on 07/17/2015   carvedilol (COREG) 3.125 MG tablet Take 1 tablet (3.125 mg total) by mouth 2 (two) times daily.   cetirizine (ZYRTEC) 10 MG tablet Take 10 mg by mouth daily as needed for allergies.    fish oil-omega-3 fatty acids 1000 MG capsule Take 1 g by mouth daily.   fluticasone (FLONASE) 50 MCG/ACT nasal spray Place 2 sprays into both nostrils daily as needed for allergies or rhinitis.   losartan (COZAAR) 25 MG tablet TAKE 1 TABLET BY MOUTH EVERY MORNING.   Multiple Vitamin (MULTIVITAMIN) tablet Take 1 tablet by mouth daily.   omeprazole (PRILOSEC) 20 MG capsule Take 1 capsule (20 mg total) by mouth 2 (two) times daily.   tamsulosin (FLOMAX) 0.4 MG CAPS capsule Take 2 capsules (0.8 mg total) by mouth daily.   No facility-administered encounter medications on file as of 08/25/2020.   Have you seen any other providers since your last visit? No  Any changes in your medications or  health? No changes in medications  Any side effects from any medications? Not at this time  Do you have an symptoms or problems not managed by your medications? Denies any symptoms or problems not being managed.   Any concerns about your health right now? Patient concerned about balance issues. He states this has been going on for months.  Has your provider asked that you check blood pressure, blood sugar, or follow special diet at home? Not at this time.   Do you get any type of exercise on a regular basis? States he does a lot of walking. Wife in memory care units.  Can you think of a goal you would like to reach for your health? Patient states he wants to remain healthy.   Do you have any problems getting your medications? Denies any issues obtaining medications or affording medications.   Is there anything that you would like to discuss during the appointment? Not at this time.   Patient reminded to have all medications, supplements and readings available for his appointment with Debbora Dus on 09/01/20 at 11:00 am.    Star Rating Drugs: Atorvastatin 20 mg  06/19/20  90DS Losartan 25 mg      04/09/20 90 DS    Follow-Up:  Pharmacist Review  Debbora Dus, CPP notified  Margaretmary Dys, [place] Assistant [phone]   I have reviewed the care management and care coordination activities outlined in this encounter and I am certifying that I  agree with the content of this note. No further action required.  Debbora Dus, PharmD Clinical Pharmacist [facility] [phone]

## 2020-09-01 ENCOUNTER — Other Ambulatory Visit: Payer: Self-pay

## 2020-09-01 ENCOUNTER — Ambulatory Visit (INDEPENDENT_AMBULATORY_CARE_PROVIDER_SITE_OTHER): Payer: Medicare Other

## 2020-09-01 DIAGNOSIS — I10 Essential (primary) hypertension: Secondary | ICD-10-CM

## 2020-09-01 DIAGNOSIS — E7800 Pure hypercholesterolemia, unspecified: Secondary | ICD-10-CM | POA: Diagnosis not present

## 2020-09-01 DIAGNOSIS — E119 Type 2 diabetes mellitus without complications: Secondary | ICD-10-CM

## 2020-09-01 NOTE — Progress Notes (Addendum)
Chronic Care Management Pharmacy Note  09/01/2020 Name:  Paul Fry MRN:  [id number] DOB:  05-09-1946  Subjective: Paul Fry is an 75 y.o. year old male who is a primary patient of Damita Dunnings, Elveria Rising, MD.  The CCM team was consulted for assistance with disease management and care coordination needs.    Engaged with patient by telephone for initial visit in response to provider referral for pharmacy case management and/or care coordination services.   Consent to Services:  The patient was given the following information about Chronic Care Management services today, agreed to services, and gave verbal consent: 1. CCM service includes personalized support from designated clinical staff supervised by the primary care provider, including individualized plan of care and coordination with other care providers 2. 24/7 contact phone numbers for assistance for urgent and routine care needs. 3. Service will only be billed when office clinical staff spend 20 minutes or more in a month to coordinate care. 4. Only one practitioner may furnish and bill the service in a calendar month. 5.The patient may stop CCM services at any time (effective at the end of the month) by phone call to the office staff. 6. The patient will be responsible for cost sharing (co-pay) of up to 20% of the service fee (after annual deductible is met). Patient agreed to services and consent obtained.  Patient Care Team: [name], MD as PCP - General (Family Medicine) Burtis Junes, NP as PCP - Cardiology (Nurse Practitioner) Debbora Dus, [facility] as Pharmacist (Pharmacist)  Recent office visits: 04/21/20- Dr. Phillip Heal Duncan/PCP - Ordered A1C. Referred to PT/ GI for stool leakage.   Recent consult visits:  03/28/20- Dr. Ellison Hughs- Urology- Follow up in 1 year.  Hospital visits: None in previous 6 months  Objective:  Lab Results  Component Value Date   CREATININE 0.87 10/16/2019   BUN 16  10/16/2019   GFR 103.81 10/16/2019   GFRNONAA >60 12/24/2016   GFRAA >60 12/24/2016   NA 137 10/16/2019   K 4.6 10/16/2019   CALCIUM 9.1 10/16/2019   CO2 31 10/16/2019   GLUCOSE 113 (H) 10/16/2019    Lab Results  Component Value Date/Time   HGBA1C 6.3 (A) 04/21/2020 10:59 AM   HGBA1C 6.8 (H) 10/16/2019 09:43 AM   HGBA1C 6.8 (H) 10/09/2018 08:55 AM   GFR 103.81 10/16/2019 09:43 AM   GFR 91.87 07/27/2018 08:57 AM   MICROALBUR 1.4 05/13/2010 10:10 AM   MICROALBUR 0.3 04/16/2009 08:45 AM    Lab Results  Component Value Date   CHOL 120 10/16/2019   HDL 26.80 (L) 10/16/2019   LDLCALC 74 10/16/2019   TRIG 95.0 10/16/2019   CHOLHDL 4 10/16/2019    Hepatic Function Latest Ref Rng & Units 10/16/2019 07/27/2018 07/22/2017  Total Protein 6.0 - 8.3 g/dL 6.8 7.4 7.4  Albumin 3.5 - 5.2 g/dL 4.0 4.0 4.0  AST 0 - 37 U/L _0 ALT 0 - 53 U/L 17 33 26  Alk Phosphatase 39 - 117 U/L 56 62 64  Total Bilirubin 0.2 - 1.2 mg/dL 0.7 0.7 0.9  Bilirubin, Direct 0.0 - 0.3 mg/dL - - -   Lab Results  Component Value Date/Time   TSH 1.64 05/13/2010 10:10 AM   TSH 1.65 04/16/2009 08:45 AM   CBC Latest Ref Rng & Units 12/24/2017 07/22/2017 02/22/2017  WBC 4.0 - 10.5 K/uL - 5.9 6.5  Hemoglobin 13.0 - 17.0 g/dL 15.3 15.3 14.4  Hematocrit 39.0 - 52.0 %  45.0 45.6 45.0  Platelets 150.0 - 400.0 K/uL - 318.0 314.0   No results found for: VD25OH  Clinical ASCVD: Yes - CAD The ASCVD Risk score Mikey Bussing DC Jr., et al., 2013) failed to calculate for the following reasons:   The valid total cholesterol range is 130 to 320 mg/dL    Depression screen [facility] 2/9 04/21/2020 10/16/2019 12/20/2018  Decreased Interest 0 0 0  Down, Depressed, Hopeless 0 0 0  PHQ - 2 Score 0 0 0  Altered sleeping - 0 -  Tired, decreased energy - 0 -  Change in appetite - 0 -  Feeling bad or failure about yourself  - 0 -  Trouble concentrating - 0 -  Moving slowly or fidgety/restless - 0 -  Suicidal thoughts - 0 -  PHQ-9 Score - 0 -   Difficult doing work/chores - Not difficult at all -    Social History   Tobacco Use  Smoking Status Never Smoker  Smokeless Tobacco Never Used   BP Readings from Last 3 Encounters:  04/21/20 120/80  11/15/19 122/62  10/23/19 122/60   Pulse Readings from Last 3 Encounters:  04/21/20 74  11/15/19 63  10/23/19 64   Wt Readings from Last 3 Encounters:  04/21/20 197 lb (89.4 kg)  11/15/19 195 lb (88.5 kg)  10/23/19 192 lb 9 oz (87.3 kg)   BMI Readings from Last 3 Encounters:  04/21/20 26.72 kg/m  11/15/19 27.58 kg/m  10/23/19 27.24 kg/m    Assessment/Interventions: Review of patient past medical history, allergies, medications, health status, including review of consultants reports, laboratory and other test data, was performed as part of comprehensive evaluation and provision of chronic care management services.   SDOH:  (Social Determinants of Health) assessments and interventions performed: Yes SDOH Interventions   Flowsheet Row Most Recent Value  SDOH Interventions   Financial Strain Interventions Intervention Not Indicated     SDOH Screenings   Alcohol Screen: Low Risk   . Last Alcohol Screening Score (AUDIT): 1  Depression (PHQ2-9): Low Risk   . PHQ-2 Score: 0  Financial Resource Strain: Low Risk   . Difficulty of Paying Living Expenses: Not very hard  Food Insecurity: No Food Insecurity  . Worried About Charity fundraiser in the Last Year: Never true  . Ran Out of Food in the Last Year: Never true  Housing: Low Risk   . Last Housing Risk Score: 0  Physical Activity: Inactive  . Days of Exercise per Week: 0 days  . Minutes of Exercise per Session: 0 min  Social Connections: Not on file  Stress: No Stress Concern Present  . Feeling of Stress : Not at all  Tobacco Use: Low Risk   . Smoking Tobacco Use: Never Smoker  . Smokeless Tobacco Use: Never Used  Transportation Needs: No Transportation Needs  . Lack of Transportation (Medical): No  . Lack of  Transportation (Non-Medical): No    CCM Care Plan  Allergies  Allergen Reactions  . Aspirin Other (See Comments)    History of GI bleed    Medications Reviewed Today    Reviewed by Debbora Dus, [facility] (Pharmacist) on 09/01/20 at 1133  Med List Status: <None>  Medication Order Taking? Sig Documenting Provider Last Dose Status Informant  ascorbic acid (VITAMIN C) 1000 MG tablet [id number] Yes Take 1,000 mg by mouth daily. [provider] Taking Active Self  atorvastatin (LIPITOR) 20 MG tablet [id number] Yes Take 1 tablet (20 mg total) by mouth every  morning. [name], MD Taking Active   calcium carbonate (TUMS - DOSED IN MG ELEMENTAL CALCIUM) 500 MG chewable tablet [id number] Yes Chew 1 tablet by mouth as needed for indigestion. Reported on 07/17/2015 [provider] Taking Active Self  carvedilol (COREG) 3.125 MG tablet [id number] Yes Take 1 tablet (3.125 mg total) by mouth 2 (two) times daily. [name], MD Taking Active   cetirizine (ZYRTEC) 10 MG tablet [id number] Yes Take 10 mg by mouth daily as needed for allergies.  [provider] Taking Active Self  fish oil-omega-3 fatty acids 1000 MG capsule [id number] Yes Take 1 g by mouth daily. [provider] Taking Active Self  fluticasone (FLONASE) 50 MCG/ACT nasal spray [id number] Yes Place 2 sprays into both nostrils daily as needed for allergies or rhinitis. [provider] Taking Active   losartan (COZAAR) 25 MG tablet [id number] Yes TAKE 1 TABLET BY MOUTH EVERY MORNING. [name], MD Taking Active   Multiple Vitamin (MULTIVITAMIN) tablet [id number] Yes Take 1 tablet by mouth daily. [provider] Taking Active Self  omeprazole (PRILOSEC) 20 MG capsule [id number] Yes Take 1 capsule (20 mg total) by mouth 2 (two) times daily. [name], MD Taking Active   tamsulosin [facility]) 0.4 MG CAPS capsule [id number] Yes Take 2 capsules (0.8 mg total) by mouth daily. [name],  MD Taking Active           Patient Active Problem List   Diagnosis Date Noted  . Stool incontinence 04/23/2020  . Caregiver stress 10/24/2019  . Pelvic floor dysfunction 10/24/2019  . Skin tag 10/18/2018  . Healthcare maintenance 10/15/2018  . Plantar fasciitis 10/15/2018  . Diabetes mellitus without complication ([place]) 39/76/7341  . BPH (benign prostatic hyperplasia) 12/23/2016  . Lower GI bleed 12/22/2016  . Advance care planning 07/16/2014  . Hyperglycemia 07/16/2014  . Skin lesion 02/27/2014  . HTN (hypertension) 07/09/2013  . Paresthesia 12/25/2012  . NICM (nonischemic cardiomyopathy) ([place]) 09/26/2012  . ED (erectile dysfunction) 05/26/2012  . DOE (dyspnea on exertion) 06/18/2011  . Medicare annual wellness visit, subsequent 05/26/2011  . Hypercholesteremia 03/03/2011  . CAD in native artery 10/14/2008  . GERD 10/14/2008  . DIVERTICULOSIS OF COLON 04/03/2008    Immunization History  Administered Date(s) Administered  . Fluad Quad(high Dose 65+) 02/18/2020  . Hepatitis A, Adult 11/07/2014  . Influenza Split 05/25/2011  . Influenza Whole 04/25/2010  . Influenza, Seasonal, Injecte, Preservative Fre 05/25/2012  . Influenza,inj,Quad PF,6+ Mos 02/26/2014, 02/05/2016, 01/31/2017, 02/23/2018  . Moderna Sars-Covid-2 Vaccination 06/25/2019, 07/23/2019  . Pneumococcal Conjugate-13 07/16/2014  . Pneumococcal Polysaccharide-23 04/25/2010, 07/17/2015  . Td 05/11/1998, 09/18/2008  . Zoster 06/17/2011    Conditions to be addressed/monitored:  Hypertension, Hyperlipidemia and Diabetes  Care Plan : [place]  Updates made by Debbora Dus, [facility] since 09/01/2020 12:00 AM    Problem: CHL AMB "PATIENT-SPECIFIC PROBLEM"     Long-Range Goal: Disease Management   Priority: High  Note:    Current Barriers:  . None identified  Pharmacist Clinical Goal(s):  [name] Patient will contact provider office for questions/concerns as evidenced notation of same in electronic  health record through collaboration with PharmD and provider.   Interventions: . 1:1 collaboration with [name], MD regarding development and update of comprehensive plan of care as evidenced by provider attestation and co-signature . Inter-disciplinary care team collaboration (see longitudinal plan of care) . Comprehensive medication review performed; medication list updated in electronic medical record  Hypertension (BP  goal <140/90) -Controlled - Clinic readings within goal  -Current treatment: . Carvedilol 3.125 mg - 1 tablet twice daily . Losartan 25 mg - 1 tablet daily every morning -Medications previously tried: none  -He uses an automatic monitor with arm cuff, checks twice a month  -Current home readings: 132/70s -Current dietary habits: Eats frozen meals, quick dinners, or out. Tries to watch salt. -Current exercise habits: Walks with his wife daily. -Denies hypotensive/hypertensive symptoms - reports feeling winded doing yard work, this is same as baseline for him.  Thought to be GERD related per last cardio visit. Next cardiology visit July 2022 -Educated on BP goals and benefits of medications for prevention of heart attack, stroke and kidney damage; -Recommend continue to monitor BP at home twice monthly, document, and provide log at future appointments -Recommended to continue current medication  Hyperlipidemia: (LDL goal < 70) -Not ideally controlled - LDL 74 -Current treatment: . Atorvastatin 20 mg - 1 tablet daily  . Fish oil 1000 mg - 1 cap daily -Medications previously tried: none  -Educated on Benefits of statin for ASCVD risk reduction; Importance of limiting foods high in cholesterol; -Recommended to continue current medication; Increase exercise. Limit cholesterol content in foods.  Diabetes (A1c goal <7%) -Controlled A1c - 6.3% -Current medications: . None -Medications previously tried: none  -Current home glucose readings - does not monitor   . fasting glucose: none . post prandial glucose: none  -Denies hypoglycemic/hyperglycemic symptoms -Educated on A1c and blood sugar goals; -Counseled to check feet daily and get yearly eye exams - reports plantar fascitis in feet, denies any cuts/signs of infection, pt reports he has an annual eye exam, denies retinopathy  -Recommended to continue current medication  GERD (Goal: Control symptoms, prevent bleeding) -Controlled  -Hiatal hernia on EGD 06/2011, h/o GI bleed -Current treatment  . Omeprazole 20 mg - 1 capsule twice daily . Tums PRN -Medications previously tried: none   -Occasional flares with trigger foods -Avoids aspirin, Aleve, Advil. Discussed using Tylenol as needed for headache.  -Recommended to continue current medication  Patient Goals/Self-Care Activities . Patient will:  - take medications as prescribed target a minimum of 150 minutes of moderate intensity exercise weekly  Follow Up Plan: Telephone follow up appointment with care management team member scheduled for: 12 months     Medication Assistance: None required.  Patient affirms current coverage meets needs.  Star Rating Drugs: Atorvastatin 20 mg  06/19/20  90DS Losartan 25 mg      04/09/20 90 DS  Mail order refill history does not always update in chart. Pt denies missed doses.  Patient's preferred pharmacy is:  [facility], [address] [place] [address] Phone: [phone] Fax: [phone]  Uses Mail order for all prescriptions unless acute med  Uses pill box? No - keeps them in the kitchen, part of daily routine  Pt endorses 100% compliance - takes morning and bedtime   We discussed: Benefits of medication synchronization, packaging and delivery as well as enhanced pharmacist oversight with [employer]. Patient decided to: Continue current medication management strategy  Care Plan and Follow Up Patient Decision:  Patient agrees  to Care Plan and Follow-up.  Debbora Dus, PharmD Clinical Pharmacist [facility] [phone]  Encounter details: CCM Time Spent      Value Time User   Time spent with patient (minutes)  60 09/01/2020  1:15 PM Debbora Dus, [facility]  Time spent performing Chart review  30 09/01/2020  1:15 PM Debbora Dus, [facility]   Total time (minutes)  90 09/01/2020  1:15 PM Debbora Dus, RPH     Moderate to High Complex Decision Making      Value Time User   Moderate to High complex decision making  Yes 09/01/2020  1:15 PM Debbora Dus, [facility]     CCM Services: This encounter meets complex CCM services and moderate to high decision making.  Prior to outreach and patient consent for Chronic Care Management, I referred this patient for services after reviewing the nominated patient list or from a personal encounter with the patient.  I have personally reviewed this encounter including the documentation in this note and have collaborated with the care management provider regarding care management and care coordination activities to include development and update of the comprehensive care plan. I am certifying that I agree with the content of this note and encounter as supervising physician.

## 2020-09-01 NOTE — Patient Instructions (Signed)
September 01, 2020  Dear Paul Fry,  It was a pleasure meeting you during our initial appointment on September 01, 2020. Below is a summary of the goals we discussed and components of chronic care management. Please contact me anytime with questions or concerns.   Visit Information  Patient Care Plan: CCM Pharmacy Care Plan    Problem Identified: CHL AMB "PATIENT-SPECIFIC PROBLEM"     Long-Range Goal: Disease Management   Priority: High  Note:    Current Barriers:  . None identified  Pharmacist Clinical Goal(s):  [name] Patient will contact provider office for questions/concerns as evidenced notation of same in electronic health record through collaboration with PharmD and provider.   Interventions: . 1:1 collaboration with [name], MD regarding development and update of comprehensive plan of care as evidenced by provider attestation and co-signature . Inter-disciplinary care team collaboration (see longitudinal plan of care) . Comprehensive medication review performed; medication list updated in electronic medical record  Hypertension (BP goal <140/90) -Controlled - Clinic readings within goal  -Current treatment: . Carvedilol 3.125 mg - 1 tablet twice daily . Losartan 25 mg - 1 tablet daily every morning -Medications previously tried: none  -He uses an automatic monitor with arm cuff, checks twice a month  -Current home readings: 132/70s -Current dietary habits: Eats frozen meals, quick dinners, or out. Tries to watch salt. -Current exercise habits: Walks with his wife daily. -Denies hypotensive/hypertensive symptoms - reports feeling winded doing yard work, this is same as baseline for him.  Thought to be GERD related per last cardio visit. Next cardiology visit July 2022 -Educated on BP goals and benefits of medications for prevention of heart attack, stroke and kidney damage; -Recommend continue to monitor BP at home twice monthly, document, and provide log at future  appointments -Recommended to continue current medication  Hyperlipidemia: (LDL goal < 70) -Not ideally controlled - LDL 74 -Current treatment: . Atorvastatin 20 mg - 1 tablet daily  . Fish oil 1000 mg - 1 cap daily -Medications previously tried: none  -Educated on Benefits of statin for ASCVD risk reduction; Importance of limiting foods high in cholesterol; -Recommended to continue current medication; Increase exercise. Limit cholesterol content in foods.  Diabetes (A1c goal <7%) -Controlled A1c - 6.3% -Current medications: . None -Medications previously tried: none  -Current home glucose readings - does not monitor  . fasting glucose: none . post prandial glucose: none  -Denies hypoglycemic/hyperglycemic symptoms -Educated on A1c and blood sugar goals; -Counseled to check feet daily and get yearly eye exams - reports plantar fascitis in feet, denies any cuts/signs of infection, pt reports he has an annual eye exam, denies retinopathy  -Recommended to continue current medication  GERD (Goal: Control symptoms, prevent bleeding) -Controlled  -Hiatal hernia on EGD 06/2011, h/o GI bleed -Current treatment  . Omeprazole 20 mg - 1 capsule twice daily . Tums PRN -Medications previously tried: none   -Occasional flares with trigger foods -Avoids aspirin, Aleve, Advil. Discussed using Tylenol as needed for headache.  -Recommended to continue current medication  Patient Goals/Self-Care Activities . Patient will:  - take medications as prescribed target a minimum of 150 minutes of moderate intensity exercise weekly  Follow Up Plan: Telephone follow up appointment with care management team member scheduled for: 12 months     [name] was given information about Chronic Care Management services today including:  1. CCM service includes personalized support from designated clinical staff supervised by his physician, including individualized plan of  care and coordination with other  care providers 2. 24/7 contact phone numbers for assistance for urgent and routine care needs. 3. Standard insurance, coinsurance, copays and deductibles apply for chronic care management only during months in which we provide at least 20 minutes of these services. Most insurances cover these services at 100%, however patients may be responsible for any copay, coinsurance and/or deductible if applicable. This service may help you avoid the need for more expensive face-to-face services. 4. Only one practitioner may furnish and bill the service in a calendar month. 5. The patient may stop CCM services at any time (effective at the end of the month) by phone call to the office staff.  Patient agreed to services and verbal consent obtained.   Patient verbalizes understanding of instructions provided today and agrees to view in MyChart.   Phil Dopp, PharmD Clinical Pharmacist [facility] [phone]   Basics of Medicine Management Taking your medicines correctly is an important part of managing or preventing medical problems. Make sure you know what disease or condition your medicine is treating, and how and when to take it. If you do not take your medicine correctly, it may not work well and may cause unpleasant side effects, including serious health problems. What should I do when I am taking medicines?  Read all the labels and inserts that come with your medicines. Review the information often.  Talk with your pharmacist if you get a refill and notice a change in the size, color, or shape of your medicines.  Know the potential side effects for each medicine that you take.  Try to get all your medicines from the same pharmacy. The pharmacist will have all your information and will understand how your medicines will affect each other (interact).  Tell your health care provider about all your medicines, including over-the-counter medicines, vitamins, and herbal  or dietary supplements. He or she will make sure that nothing will interact with any of your prescribed medicines.   How can I take my medicines safely?  Take medicines only as told by your health care provider. ? Do not take more of your medicine than instructed. ? Do not take anyone else's medicines. ? Do not share your medicines with others. ? Do not stop taking your medicines unless your health care provider tells you to do so. ? You may need to avoid alcohol or certain foods or liquids when taking certain medicines. Follow your health care provider's instructions.  Do not split, mash, or chew your medicines unless your health care provider tells you to do so. Tell your health care provider if you have trouble swallowing your medicines.  For liquid medicine, use the dosing container that was provided. How should I organize my medicines? Know your medicines  Know what each of your medicines looks like. This includes size, color, and shape. Tell your health care provider if you are having trouble recognizing all the medicines that you are taking.  If you cannot tell your medicines apart because they look similar, keep them in original bottles.  If you cannot read the labels on the bottles, tell your pharmacist to put your medicines in containers with large print.  Review your medicines and your schedule with family members, a friend, or a caregiver. Use a pill organizer  Use a tool to organize your medicine schedule. Tools include a weekly pillbox, a written chart, a notebook, or a calendar.  Your tool should help you remember the following  things about each medicine: ? The name of the medicine. ? The amount (dose) to take. ? The schedule. This is the day and time the medicine should be taken. ? The appearance. This includes color, shape, size, and stamp. ? How to take your medicines. This includes instructions to take them with food, without food, with fluids, or with other  medicines.  Create reminders for taking your medicines. Use sticky notes, or alarms on your watch, mobile device, or phone calendar.  You may choose to use a more advanced management system. These systems have storage, alarms, and visual and audio prompts.  Some medicines can be taken on an "as-needed" basis. These include medicines for nausea or pain. If you take an as-needed medicine, write down the name and dose, as well as the date and time that you took it.   How should I plan for travel?  Take your pillbox, medicines, and organization system with you when traveling.  Have your medicines refilled before you travel. This will ensure that you do not run out of your medicines while you are away from home.  Always carry an updated list of your medicines with you. If there is an emergency, a first responder can quickly see what medicines you are taking.  Do not pack your medicines in checked luggage in case your luggage is lost or delayed.  If any of your medicines is considered a controlled substance, make sure you bring a letter from your health care provider with you. How should I store and discard my medicines? For safe storage:  Store medicines in a cool, dry area away from light, or as directed by your health care provider. Do not store medicines in the bathroom. Heat and humidity will affect them.  Do not store your medicines with other chemicals, or with medicines for pets or other household members.  Keep medicines away from children and pets. Do not leave them on counters or bedside tables. Store them in high cabinets or on high shelves. For safe disposal:  Check expiration dates regularly. Do not take expired medicines. Discard medicines that are older than the expiration date.  Learn a safe way to dispose of your medicines. You may: ? Use a local government, hospital, or pharmacy medicine-take-back program. ? Mix the medicines with inedible substances, put them in a sealed  bag or empty container, and throw them in the trash. What should I remember?  Tell your health care provider if you: ? Experience side effects. ? Have new symptoms. ? Have other concerns about taking your medicines.  Review your medicines regularly with your health care provider. Other medicines, diet, medical conditions, weight changes, and daily habits can all affect how medicines work. Ask if you need to continue taking each medicine, and discuss how well each one is working.  Refill your medicines early to avoid running out of them.  In case of an accidental overdose, call your local [place] at [phone] or visit your local emergency department immediately. This is important. Summary  Taking your medicines correctly is an important part of managing or preventing medical problems.  You need to make sure that you understand what you are taking a medicine for, as well as how and when you need to take it.  Know your medicines and use a pill organizer to help you take your medicines correctly.  In case of an accidental overdose, call your local [place] at [phone] or visit your local emergency department immediately.  This is important. This information is not intended to replace advice given to you by your health care provider. Make sure you discuss any questions you have with your health care provider. Document Revised: 04/21/2017 Document Reviewed: 04/21/2017 Elsevier Patient Education  2021 [employer].

## 2020-10-13 DIAGNOSIS — Z961 Presence of intraocular lens: Secondary | ICD-10-CM | POA: Diagnosis not present

## 2020-10-13 DIAGNOSIS — H52203 Unspecified astigmatism, bilateral: Secondary | ICD-10-CM | POA: Diagnosis not present

## 2020-10-16 ENCOUNTER — Other Ambulatory Visit (INDEPENDENT_AMBULATORY_CARE_PROVIDER_SITE_OTHER): Payer: Medicare Other

## 2020-10-16 ENCOUNTER — Other Ambulatory Visit: Payer: Self-pay

## 2020-10-16 ENCOUNTER — Other Ambulatory Visit: Payer: Self-pay | Admitting: Family Medicine

## 2020-10-16 ENCOUNTER — Ambulatory Visit: Payer: Medicare Other

## 2020-10-16 DIAGNOSIS — Z125 Encounter for screening for malignant neoplasm of prostate: Secondary | ICD-10-CM

## 2020-10-16 DIAGNOSIS — E119 Type 2 diabetes mellitus without complications: Secondary | ICD-10-CM

## 2020-10-16 DIAGNOSIS — E7800 Pure hypercholesterolemia, unspecified: Secondary | ICD-10-CM

## 2020-10-16 LAB — COMPREHENSIVE METABOLIC PANEL
ALT: 19 U/L (ref 0–53)
AST: 21 U/L (ref 0–37)
Albumin: 4.1 g/dL (ref 3.5–5.2)
Alkaline Phosphatase: 50 U/L (ref 39–117)
BUN: 13 mg/dL (ref 6–23)
CO2: 27 mEq/L (ref 19–32)
Calcium: 9.2 mg/dL (ref 8.4–10.5)
Chloride: 104 mEq/L (ref 96–112)
Creatinine, Ser: 0.95 mg/dL (ref 0.40–1.50)
GFR: 78.62 mL/min (ref >60.00)
Glucose, Bld: 134 mg/dL — ABNORMAL HIGH (ref 70–99)
Potassium: 4.5 mEq/L (ref 3.5–5.1)
Sodium: 139 mEq/L (ref 135–145)
Total Bilirubin: 0.7 mg/dL (ref 0.2–1.2)
Total Protein: 7.3 g/dL (ref 6.0–8.3)

## 2020-10-16 LAB — LIPID PANEL
Cholesterol: 129 mg/dL (ref 0–200)
HDL: 30.4 mg/dL — ABNORMAL LOW (ref >39.00)
LDL Cholesterol: 76 mg/dL (ref 0–99)
NonHDL: 99.05
Total CHOL/HDL Ratio: 4
Triglycerides: 115 mg/dL (ref 0.0–149.0)
VLDL: 23 mg/dL (ref 0.0–40.0)

## 2020-10-16 LAB — HEMOGLOBIN A1C: Hgb A1c MFr Bld: 6.8 % — ABNORMAL HIGH (ref 4.6–6.5)

## 2020-10-16 LAB — PSA, MEDICARE: PSA: 1.03 ng/ml (ref 0.10–4.00)

## 2020-10-23 ENCOUNTER — Ambulatory Visit (INDEPENDENT_AMBULATORY_CARE_PROVIDER_SITE_OTHER): Payer: Medicare Other | Admitting: Family Medicine

## 2020-10-23 ENCOUNTER — Encounter: Payer: Self-pay | Admitting: Family Medicine

## 2020-10-23 ENCOUNTER — Other Ambulatory Visit: Payer: Self-pay

## 2020-10-23 VITALS — BP 116/76 | HR 94 | Temp 97.3°F | Ht 72.0 in | Wt 201.0 lb

## 2020-10-23 DIAGNOSIS — E7800 Pure hypercholesterolemia, unspecified: Secondary | ICD-10-CM | POA: Diagnosis not present

## 2020-10-23 DIAGNOSIS — E119 Type 2 diabetes mellitus without complications: Secondary | ICD-10-CM

## 2020-10-23 DIAGNOSIS — Z0000 Encounter for general adult medical examination without abnormal findings: Secondary | ICD-10-CM | POA: Diagnosis not present

## 2020-10-23 DIAGNOSIS — K219 Gastro-esophageal reflux disease without esophagitis: Secondary | ICD-10-CM

## 2020-10-23 DIAGNOSIS — Z7189 Other specified counseling: Secondary | ICD-10-CM

## 2020-10-23 DIAGNOSIS — I10 Essential (primary) hypertension: Secondary | ICD-10-CM | POA: Diagnosis not present

## 2020-10-23 DIAGNOSIS — R1013 Epigastric pain: Secondary | ICD-10-CM | POA: Diagnosis not present

## 2020-10-23 DIAGNOSIS — I429 Cardiomyopathy, unspecified: Secondary | ICD-10-CM

## 2020-10-23 LAB — CBC WITH DIFFERENTIAL/PLATELET
Basophils Absolute: 0.1 10*3/uL (ref 0.0–0.1)
Basophils Relative: 1 % (ref 0.0–3.0)
Eosinophils Absolute: 0.1 10*3/uL (ref 0.0–0.7)
Eosinophils Relative: 1 % (ref 0.0–5.0)
HCT: 38.4 % — ABNORMAL LOW (ref 39.0–52.0)
Hemoglobin: 12.5 g/dL — ABNORMAL LOW (ref 13.0–17.0)
Lymphocytes Relative: 34.8 % (ref 12.0–46.0)
Lymphs Abs: 1.9 10*3/uL (ref 0.7–4.0)
MCHC: 32.5 g/dL (ref 30.0–36.0)
MCV: 83 fl (ref 78.0–100.0)
Monocytes Absolute: 0.3 10*3/uL (ref 0.1–1.0)
Monocytes Relative: 5 % (ref 3.0–12.0)
Neutro Abs: 3.2 10*3/uL (ref 1.4–7.7)
Neutrophils Relative %: 58.2 % (ref 43.0–77.0)
Platelets: 350 10*3/uL (ref 150.0–400.0)
RBC: 4.63 Mil/uL (ref 4.22–5.81)
RDW: 20.5 % — ABNORMAL HIGH (ref 11.5–15.5)
WBC: 5.5 10*3/uL (ref 4.0–10.5)

## 2020-10-23 LAB — LIPASE: Lipase: 53 U/L (ref 11.0–59.0)

## 2020-10-23 MED ORDER — CARVEDILOL 3.125 MG PO TABS: 3.1250 mg | ORAL_TABLET | Freq: Two times a day (BID) | ORAL | 3 refills | Status: DC

## 2020-10-23 MED ORDER — TAMSULOSIN HCL 0.4 MG PO CAPS: 0.8000 mg | ORAL_CAPSULE | Freq: Every day | ORAL | 3 refills | Status: DC

## 2020-10-23 MED ORDER — OMEPRAZOLE 20 MG PO CPDR: 20.0000 mg | DELAYED_RELEASE_CAPSULE | Freq: Two times a day (BID) | ORAL | 3 refills | Status: DC

## 2020-10-23 MED ORDER — ATORVASTATIN CALCIUM 20 MG PO TABS: 20.0000 mg | ORAL_TABLET | Freq: Every morning | ORAL | 3 refills | Status: DC

## 2020-10-23 MED ORDER — LOSARTAN POTASSIUM 25 MG PO TABS: 12.5000 mg | ORAL_TABLET | Freq: Every morning | ORAL | 3 refills | Status: DC

## 2020-10-23 NOTE — Patient Instructions (Signed)
Cut the losartan back to 1/2 tab see how you feel.  Go to the lab on the way out.   If you have mychart we'll likely use that to update you.    I'll update the GI clinic.  Try to cut back on sweets and recheck A1c in about 6 months at a visit.  You don't have to fast.   Take care.  Glad to see you.

## 2020-10-23 NOTE — Progress Notes (Signed)
This visit occurred during the SARS-CoV-2 public health emergency.  Safety protocols were in place, including screening questions prior to the visit, additional usage of staff PPE, and extensive cleaning of exam room while observing appropriate contact time as indicated for disinfecting solutions.  I have personally reviewed the Medicare Annual Wellness questionnaire and have noted 1. The patient's medical and social history 2. Their use of alcohol, tobacco or illicit drugs 3. Their current medications and supplements 4. The patient's functional ability including ADL's, fall risks, home safety risks and hearing or visual             impairment. 5. Diet and physical activities 6. Evidence for depression or mood disorders  The patients weight, height, BMI have been recorded in the chart and visual acuity is per eye clinic.  I have made referrals, counseling and provided education to the patient based review of the above and I have provided the pt with a written personalized care plan for preventive services.  Provider list updated- see scanned forms.  Routine anticipatory guidance given to patient.  See health maintenance. The possibility exists that previously documented standard health maintenance information may have been brought forward from a previous encounter into this note.  If needed, that same information has been updated to reflect the current situation based on today's encounter.    Flu up-to-date Shingles discussed with patient PNA up-to-date COVID-vaccine previously done Tetanus 2010, discussed with Colonoscopy 2018 Prostate cancer screening- 2022 Advance directive- niece Paul Fry designated if patient were incapacitated.   Cognitive function addressed- see scanned forms- and if abnormal then additional documentation follows.   His wife required placement at [facility] due to her memory troubles.  He is still checking on her frequently.  Discussed.  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  No  meds.  Already on statin.  D/w pt about diet and cutting back on carbs.    Elevated Cholesterol: Using medications without problems: yes Muscle aches: no Diet compliance: d/w pt.   Exercise: d/w pt.   Hypertension:    Using medication without problems or lightheadedness: minimally lightheaded on standing, cautions d/w pt.   Chest pain with exertion: no- see below.   Edema:no Short of breath: no Some fatigue noted.  This was a gradual change  GERD.  Still with substernal discomfort with drinking/swallowimg water or solids.  Noted leaning forward.  Feeling of food sticking when swallowing but can have the same sensation with not eating.  No vomiting.  No blood in stool.  Already on PPI.    PMH and SH reviewed  Meds, vitals, and allergies reviewed.   ROS: Per HPI.  Unless specifically indicated otherwise in HPI, the patient denies:  General: fever. Eyes: acute vision changes ENT: sore throat Cardiovascular: chest pain Respiratory: SOB GI: vomiting GU: dysuria Musculoskeletal: acute back pain Derm: acute rash Neuro: acute motor dysfunction Psych: worsening mood Endocrine: polydipsia Heme: bleeding Allergy: hayfever  GEN: nad, alert and oriented HEENT: mucous membranes moist NECK: supple w/o LA CV: rrr. PULM: ctab, no inc wob ABD: soft, +bs, slightly ttp but no rebound in upper midline abd.   EXT: no edema SKIN: no acute rash  Diabetic foot exam: Normal inspection No skin breakdown No calluses  Normal DP pulses Normal sensation to light touch and monofilament Nails normal

## 2020-10-26 NOTE — Assessment & Plan Note (Signed)
Already on PPI but substernal discomfort with drinking/swallowing with sensation of dysphagia.  I will ask for GI input.  This been going on for about 3 weeks.  See notes on labs.

## 2020-10-26 NOTE — Assessment & Plan Note (Signed)
Flu up-to-date Shingles discussed with patient PNA up-to-date COVID-vaccine previously done Tetanus 2010, discussed with Colonoscopy 2018 Prostate cancer screening- 2022 Advance directive- niece Paul Fry designated if patient were incapacitated.   Cognitive function addressed- see scanned forms- and if abnormal then additional documentation follows.

## 2020-10-26 NOTE — Assessment & Plan Note (Signed)
Continue atorvastatin.  Labs discussed with patient. 

## 2020-10-26 NOTE — Assessment & Plan Note (Signed)
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  No meds.  Already on statin.  D/w pt about diet and cutting back on carbs.   We can recheck periodically.  He agrees.

## 2020-10-26 NOTE — Assessment & Plan Note (Signed)
Advance directive- niece Janice Quaranta designated if patient were incapacitated.  

## 2020-10-26 NOTE — Assessment & Plan Note (Addendum)
Continue carvedilol.  Continue work on diet and exercise.  Discussed cutting losartan back to half tablet daily.  See after visit summary.

## 2020-10-29 ENCOUNTER — Ambulatory Visit: Payer: Medicare Other

## 2020-11-18 ENCOUNTER — Ambulatory Visit (INDEPENDENT_AMBULATORY_CARE_PROVIDER_SITE_OTHER): Payer: Medicare Other | Admitting: Cardiology

## 2020-11-18 ENCOUNTER — Other Ambulatory Visit: Payer: Self-pay

## 2020-11-18 ENCOUNTER — Encounter: Payer: Self-pay | Admitting: Cardiology

## 2020-11-18 ENCOUNTER — Telehealth: Payer: Self-pay | Admitting: Cardiology

## 2020-11-18 VITALS — BP 110/70 | HR 103 | Ht 72.0 in | Wt 201.0 lb

## 2020-11-18 DIAGNOSIS — I4891 Unspecified atrial fibrillation: Secondary | ICD-10-CM

## 2020-11-18 DIAGNOSIS — I10 Essential (primary) hypertension: Secondary | ICD-10-CM | POA: Diagnosis not present

## 2020-11-18 DIAGNOSIS — I2510 Atherosclerotic heart disease of native coronary artery without angina pectoris: Secondary | ICD-10-CM

## 2020-11-18 MED ORDER — RIVAROXABAN 20 MG PO TABS: 20.0000 mg | ORAL_TABLET | Freq: Every day | ORAL | 6 refills | Status: DC

## 2020-11-18 MED ORDER — APIXABAN 5 MG PO TABS: 5.0000 mg | ORAL_TABLET | Freq: Two times a day (BID) | ORAL | 1 refills | Status: DC

## 2020-11-18 MED ORDER — METOPROLOL SUCCINATE ER 50 MG PO TB24: 50.0000 mg | ORAL_TABLET | Freq: Every day | ORAL | 1 refills | Status: DC

## 2020-11-18 NOTE — Telephone Encounter (Signed)
Reviewed with Dr [name].  He gave orders to d/c Eliquis and change to Xarelto 20 mg a day.  Rx sent into pharmacy as requested.

## 2020-11-18 NOTE — Progress Notes (Addendum)
Cardiology Office Note:    Date:  11/18/2020   ID:  Paul Fry, DOB Apr 19, 1946, MRN [id number]  PCP:  [name], MD   [facility] Providers Cardiologist:  Truitt Merle, NP (Inactive) [name]    Referring MD: [name], MD     History of Present Illness:    Paul Fry is a 75 y.o. male here for follow-up nonischemic cardiomyopathy with most recent ejection fraction 50%.  Atrial fibrillation picked up today on ECG 11/18/2020.  Prior cardiac catheterization in 2010 showed minor nonobstructive CAD.  EF at that time was 30%.  Cardiac MRI in 2016 showed EF of 49% with some mild noncompaction in the apical regions.  He has had prior GI bleed with occasional GERD.  Dr. Fuller Plan. Usually may feel some symptoms with his GERD may be some windedness but transient. Feels some SOB up hill to trash can.   Last diverticular bleed 2011. No PRBC.   LDL 76 hemoglobin A1c 6.8 hemoglobin 12.5 creatinine 0.9  Past Medical History:  Diagnosis Date   Allergy    SEASONAL   Anemia    Arthritis    KNEES   BPH (benign prostatic hyperplasia)    Diverticulosis of colon    Elevated PSA    1.22 on 01-20-2016   Feeling of incomplete bladder emptying    GERD (gastroesophageal reflux disease)    Hiatal hernia    History of adenomatous polyp of colon    2014 tubular adenoma   History of chronic gastritis    History of Helicobacter pylori infection    07/ 2016   History of lower GI bleeding    07/ 2010 and 12/ 2011  diverticular bleed both times   Hyperlipidemia    Hypertension    Lower urinary tract symptoms (LUTS)    Nonischemic cardiomyopathy (HCC)    Plantar fascia syndrome    Strains to urinate    INTERMITTANT   Type 2 diabetes, diet controlled ([place])    ON BORDER LINE,NEVER TOOK MEDICATION.   Wears glasses     Past Surgical History:  Procedure Laterality Date   APPENDECTOMY  teenager   CARDIAC CATHETERIZATION  09-10-2008   dr dalton mclean   mild  non-obstructive CAD;  30-40% mRCA, 20%pLAD,  ef 55%   CARDIOVASCULAR STRESS TEST  07/29/2011   Low risk nuclear study w/ no ischemia or infarct/  LVEF 47% and  apical hypokinesis (08-07-2012 cardiac MRI -- EF 49% w/ no hyperenhancement distal septal and apical hypokinesis, mild LAE, mild RVE, mild AR with mild dilated sinus 12mm)   CATARACT EXTRACTION W/ INTRAOCULAR LENS  IMPLANT, BILATERAL  2016   COLONOSCOPY  last one 12-21-2012   CYSTOSCOPY WITH INSERTION OF UROLIFT N/A 06/17/2016   Procedure: CYSTOSCOPY WITH INSERTION OF UROLIFT;  Surgeon: [name], MD;  Location: [place];  Service: Urology;  Laterality: N/A;   ESOPHAGOGASTRODUODENOSCOPY  last one 11-22-2014   TRANSTHORACIC ECHOCARDIOGRAM  09/17/2014   ef 50-55%/  trivial AR, MR and PR/  mild LAE and RAE/  mild TR    Current Medications: Current Meds  Medication Sig   apixaban (ELIQUIS) 5 MG TABS tablet Take 1 tablet (5 mg total) by mouth 2 (two) times daily.   ascorbic acid (VITAMIN C) 1000 MG tablet Take 1,000 mg by mouth daily.   atorvastatin (LIPITOR) 20 MG tablet Take 1 tablet (20 mg total) by mouth every morning.   calcium carbonate (TUMS - DOSED IN MG ELEMENTAL CALCIUM)  500 MG chewable tablet Chew 1 tablet by mouth as needed for indigestion. Reported on 07/17/2015   fish oil-omega-3 fatty acids 1000 MG capsule Take 1 g by mouth daily.   losartan (COZAAR) 25 MG tablet Take 0.5 tablets (12.5 mg total) by mouth every morning.   metoprolol succinate (TOPROL-XL) 50 MG 24 hr tablet Take 1 tablet (50 mg total) by mouth daily. Take with or immediately following a meal.   Multiple Vitamin (MULTIVITAMIN) tablet Take 1 tablet by mouth daily.   omeprazole (PRILOSEC) 20 MG capsule Take 1 capsule (20 mg total) by mouth 2 (two) times daily.   tamsulosin (FLOMAX) 0.4 MG CAPS capsule Take 2 capsules (0.8 mg total) by mouth daily.   [DISCONTINUED] carvedilol (COREG) 3.125 MG tablet Take 1 tablet (3.125 mg total) by mouth 2  (two) times daily.     Allergies:   Aspirin   Social History   Socioeconomic History   Marital status: Married    Spouse name: Not on file   Number of children: 0   Years of education: Not on file   Highest education level: Not on file  Occupational History   Occupation: truck driver- retired    Fish farm manager: RETIRED  Tobacco Use   Smoking status: Never   Smokeless tobacco: Never  Vaping Use   Vaping Use: Never used  Substance and Sexual Activity   Alcohol use: Yes    Comment: rarely drinks beer   Drug use: No   Sexual activity: Not Currently  Other Topics Concern   Not on file  Social History Narrative   Married 2003, no children.    Retired Administrator, gets regular exercise- walking.    Step daughter is MD in [place]   Social Determinants of Health   Financial Resource Strain: Low Risk    Difficulty of Paying Living Expenses: Not very hard  Food Insecurity: Not on file  Transportation Needs: Not on file  Physical Activity: Not on file  Stress: Not on file  Social Connections: Not on file     Family History: The patient's family history includes Colon cancer (age of onset: 93) in his mother; Colon polyps in his brother; Heart disease in his sister; Hypertension in his sister and sister; Kidney disease in his sister; Lung cancer in his brother. There is no history of Diabetes, Depression, Stroke, Alcohol abuse, or Prostate cancer.  ROS:   Please see the history of present illness.     All other systems reviewed and are negative.  EKGs/Labs/Other Studies Reviewed:     EKG:  EKG is  ordered today.  The ekg ordered today demonstrates atrial fibrillation heart rate 103 bpm.  Normal QRS.  Recent Labs: 10/16/2020: ALT 19; BUN 13; Creatinine, Ser 0.95; Potassium 4.5; Sodium 139 10/23/2020: Hemoglobin 12.5; Platelets 350.0  Recent Lipid Panel    Component Value Date/Time   CHOL 129 10/16/2020 0904   TRIG 115.0 10/16/2020 0904   HDL 30.40 (L) 10/16/2020 0904    CHOLHDL 4 10/16/2020 0904   VLDL 23.0 10/16/2020 0904   LDLCALC 76 10/16/2020 0904     Risk Assessment/Calculations:          Physical Exam:    VS:  BP 110/70 (BP Location: Left Arm, Patient Position: Sitting, Cuff Size: Normal)   Pulse (!) 103   Ht 6' (1.829 m)   Wt 201 lb (91.2 kg)   SpO2 96%   BMI 27.26 kg/m     Wt Readings from Last 3 Encounters:  11/18/20 201 lb (91.2 kg)  10/23/20 201 lb (91.2 kg)  04/21/20 197 lb (89.4 kg)     GEN:  Well nourished, well developed in no acute distress HEENT: Normal NECK: No JVD; No carotid bruits LYMPHATICS: No lymphadenopathy CARDIAC: Irregularly irregular, mildly tachycardic, no murmurs, rubs, gallops RESPIRATORY:  Clear to auscultation without rales, wheezing or rhonchi  ABDOMEN: Soft, non-tender, non-distended MUSCULOSKELETAL:  No edema; No deformity  SKIN: Warm and dry NEUROLOGIC:  Alert and oriented x 3 PSYCHIATRIC:  Normal affect   ASSESSMENT:    1. CAD in native artery   2. Essential hypertension   3. Atrial fibrillation, unspecified type [facility])    PLAN:    In order of problems listed above:  Atrial fibrillation -Newly discovered on ECG 11/18/2020.  Heart rate 103 bpm.  Given his prior GI bleed he has not been on aspirin.  I am concerned about utilizing chronic anticoagulation and him given his bleeding risk.  We will go ahead and refer to Dr. Quentin Ore for potential watchman device. -We will start Eliquis 5 mg twice a day.  Watch for any signs of bleeding.  Last hemoglobin 12.5 creatinine 0.95. -I will stop his carvedilol 3.125 mg twice a day and start him on metoprolol succinate 50 mg daily for improved rate control.  Nonischemic cardiomyopathy - Most recent EF 45 to 50% stable.  Feels fine.  No significant shortness of breath.  NYHA class I.  Essential hypertension currently well controlled 110/70.  changing carvedilol to metoprolol as above and losartan 12.5 mg once a day.  Hyperlipidemia - Continue with  atorvastatin 20 mg a day.  Remember he does have nonobstructive coronary artery disease.  Prior GI bleed - Not on aspirin.  He will be challenging to place on long-term anticoagulation.  We will set him up for watchman device referral.  Dr. Fuller Plan has been following in GI.  GERD - GERD symptoms, Dr. Fuller Plan will be seeing him soon.  I am starting him on Eliquis 5 mg twice a day for new onset atrial fibrillation.  He may require cardioversion if he is still in atrial fibrillation upon follow-up visit.  1 month follow-up. APP if possible.     Medication Adjustments/Labs and Tests Ordered: Current medicines are reviewed at length with the patient today.  Concerns regarding medicines are outlined above.  Orders Placed This Encounter  Procedures   Ambulatory referral to Cardiac Electrophysiology   EKG 12-Lead   Meds ordered this encounter  Medications   metoprolol succinate (TOPROL-XL) 50 MG 24 hr tablet    Sig: Take 1 tablet (50 mg total) by mouth daily. Take with or immediately following a meal.    Dispense:  90 tablet    Refill:  1   apixaban (ELIQUIS) 5 MG TABS tablet    Sig: Take 1 tablet (5 mg total) by mouth 2 (two) times daily.    Dispense:  60 tablet    Refill:  1    Patient Instructions  Medication Instructions:  Please discontinue your carvedilol. Start Metoprolol Succinate 50 mg daily. Start Eliquis 5 mg one tablet twice a day. Continue all other medications as listed.  *If you need a refill on your cardiac medications before your next appointment, please call your pharmacy*  You have been referred to see Dr Lars Mage to discuss watchman device.  Follow-Up: At [facility], you and your health needs are our priority.  As part of our continuing mission to provide you with exceptional heart care,  we have created designated Provider Care Teams.  These Care Teams include your primary Cardiologist (physician) and Advanced Practice Providers (APPs -  Physician  Assistants and Nurse Practitioners) who all work together to provide you with the care you need, when you need it.  We recommend signing up for the patient portal called "MyChart".  Sign up information is provided on this After Visit Summary.  MyChart is used to connect with patients for Virtual Visits (Telemedicine).  Patients are able to view lab/test results, encounter notes, upcoming appointments, etc.  Non-urgent messages can be sent to your provider as well.   To learn more about what you can do with MyChart, go to [email or web address].    Your next appointment:   1 month(s)  The format for your next appointment:   In Person  Provider:   You will see one of the following Advanced Practice Providers on your designated Care Team:   Cecilie Kicks, NP Scott Calton Golds or any NP/PA.     Thank you for choosing [facility]!!     Signed, Candee Furbish, MD  11/18/2020 12:28 PM    [facility]  ADDENDUM: [facility] Referral for Left Atrial Appendage Closure with Non-Valvular Atrial Fibrillation   Bryceton L Morrical is a 75 y.o. male is being referred to the [facility] Team for evaluation for Left Atrial Appendage Closure with Watchman device for the management of stroke risk resulting form non-valvular atrial fibrillation.    Base upon Mr. Mansouri's history, he is felt to be a poor candidate for long-term anticoagulation because of a history of bleeding (e.g. intracerebral, subdural, GI, retro-peritoneal).  The patient has a HAS-BLED score of   indicating a Yearly Major Bleeding Risk of  %.      His CHADS2-VASc Score is 6 with an unadjusted Ischemic Stroke Rate (% per year) of 9.7%.    His stroke risk necessitates a strategy of stroke prevention with either long-term oral anticoagulation or left atrial appendage occlusion therapy. We have discussed their bleeding risk in the context of their comorbid medical  problems, as well as the rationale for referral for evaluation of Watchman left atrial appendage occlusion therapy. While the patient is at high long-term bleeding risk, they may be appropriate for short-term anticoagulation. Based on this individual patient's stroke and bleeding risk, a shared decision has been made to refer the patient for consideration of Watchman left atrial appendage closure utilizing the [employer] of Cardiology shared decision tool.   Candee Furbish, MD

## 2020-11-18 NOTE — Patient Instructions (Signed)
Medication Instructions:  Please discontinue your carvedilol. Start Metoprolol Succinate 50 mg daily. Start Eliquis 5 mg one tablet twice a day. Continue all other medications as listed.  *If you need a refill on your cardiac medications before your next appointment, please call your pharmacy*  You have been referred to see Dr Lars Mage to discuss watchman device.  Follow-Up: At [facility], you and your health needs are our priority.  As part of our continuing mission to provide you with exceptional heart care, we have created designated Provider Care Teams.  These Care Teams include your primary Cardiologist (physician) and Advanced Practice Providers (APPs -  Physician Assistants and Nurse Practitioners) who all work together to provide you with the care you need, when you need it.  We recommend signing up for the patient portal called "MyChart".  Sign up information is provided on this After Visit Summary.  MyChart is used to connect with patients for Virtual Visits (Telemedicine).  Patients are able to view lab/test results, encounter notes, upcoming appointments, etc.  Non-urgent messages can be sent to your provider as well.   To learn more about what you can do with MyChart, go to [email or web address].    Your next appointment:   1 month(s)  The format for your next appointment:   In Person  Provider:   You will see one of the following Advanced Practice Providers on your designated Care Team:   Cecilie Kicks, NP Scott Calton Golds or any NP/PA.     Thank you for choosing [facility]!!

## 2020-11-18 NOTE — Telephone Encounter (Signed)
Spoke with pharmacy.  Insurance will not cover the cost of Eliquis but will Xarelto or Warfarin.  Advised I will discuss with Dr [name] and call back.

## 2020-11-18 NOTE — Telephone Encounter (Signed)
Patient called to say that his pharmacy called him to say that they need to know why he is being prescribe apixaban (ELIQUIS) 5 MG TABS tablet. And he needs for the prescription to go to [place], [place]. Please advise

## 2020-11-20 ENCOUNTER — Telehealth: Payer: Self-pay | Admitting: Cardiology

## 2020-11-20 NOTE — Telephone Encounter (Signed)
**Note De-Identified  Obfuscation** The pt had an office visit on 7/12 with Dr [name] and was prescribed Eliquis 5 mg to be taken BID. He states that [place] advised him that his cost would be over $600 for a 30 day supply of Eliquis so he called the office back on 7/12 but has not heard back from [place]. He states that he is not out of Eliquis as he never started taking due to cost.  Per note in the pts chart Eliquis was changed to Xarelto 20 mg daily on 7/12 as [facility] advised Dr [name] nurse that Xarelto was the preferred anticoagulant on the pts plan of covered medications.  I advised the pt that I would call him right back after I s/w [facility] to get his price for Xarelto. I called [facility] and was advised that a 30 day supply of Xarelto will cost the pt $624 as he has an unmet deductible.  I called the pt and we discussed pt asst for Eliquis through [employer] and he is interested in applying. I gave him their phone number so he can call to discuss his eligibility to be approved for their Eliquis program.  He is aware that we are leaving him 2 weeks of Eliquis 5mg  samples , a Eliquis free 30 day co-pay card, and a [employer] application in the front office at Dr [name] office at [facility] [employer] [address]	[place] in [place] to pick up in the front office.  He thanked me for my assistance and states that he is calling [employer] now to check his eligibility.

## 2020-11-20 NOTE — Telephone Encounter (Signed)
**Note De-Identified  Obfuscation** The pt did call me back to advise that he called [employer] and was advised that his income exceeds their limit but that because his wife is in a nursing facility that he may be approved on a "hardship basis".  He states that once he gets records from the nursing facility that shows how much oop he pays for his wife's care along with his 2021 proof of income, and his oop RX expense report for 2022, he will bring all to Dr [name] office to drop off and is aware that we will take care of the provider page of the application and will fax all to [employer].

## 2020-11-20 NOTE — Telephone Encounter (Signed)
Pt c/o medication issue: 1. Name of Medication: Eliquis  2. How are you currently taking this medication (dosage and times per day)? Once a day 3. Are you having a reaction (difficulty breathing--STAT)?  No  4. What is your medication issue? Need assistants

## 2020-11-26 NOTE — Telephone Encounter (Signed)
**Note De-Identified  Obfuscation** FYI:  Xarelto is currently listed in the pts chart but Eliquis was prescribed first and then changed to Xarelto in hopes that the cost would be less but it was not as both medications are name brand.  The pt called today to ask questions concerning his [employer] application. I answered all of his questions that I could. He states that he will contact [employer] as he does have questions concerning the "hardship letter" that I cannot answer. He states that he will be bringing his completed application and all documents to the office to drop off and will bring his Part D ins card and newest medicare card to be scanned into his chart as the application requires copies.  He thanked me for taking his call.

## 2020-12-02 NOTE — Telephone Encounter (Signed)
Paperwork signed and faxed.

## 2020-12-02 NOTE — Telephone Encounter (Signed)
**Note De-Identified  Obfuscation** The pt left his completed [employer] application for Eliquis at the office with documents.  I have completed the provider page of the application and have emailed all to our Triage Team Lead so she can obtain Dr Jacolyn Reedy signature (Dr [name] is out of the office this week), date it , and to fax all to [employer] at he fax number written on the cover letter included or to place in the to be faxed box in Medical Records to be faxed.

## 2020-12-04 ENCOUNTER — Ambulatory Visit (INDEPENDENT_AMBULATORY_CARE_PROVIDER_SITE_OTHER): Payer: Medicare Other | Admitting: Gastroenterology

## 2020-12-04 ENCOUNTER — Encounter: Payer: Self-pay | Admitting: Gastroenterology

## 2020-12-04 ENCOUNTER — Telehealth: Payer: Self-pay

## 2020-12-04 VITALS — BP 130/60 | HR 72 | Ht 72.0 in | Wt 199.0 lb

## 2020-12-04 DIAGNOSIS — I4891 Unspecified atrial fibrillation: Secondary | ICD-10-CM | POA: Insufficient documentation

## 2020-12-04 DIAGNOSIS — D509 Iron deficiency anemia, unspecified: Secondary | ICD-10-CM

## 2020-12-04 DIAGNOSIS — Z7901 Long term (current) use of anticoagulants: Secondary | ICD-10-CM | POA: Diagnosis not present

## 2020-12-04 DIAGNOSIS — R1013 Epigastric pain: Secondary | ICD-10-CM

## 2020-12-04 DIAGNOSIS — R195 Other fecal abnormalities: Secondary | ICD-10-CM | POA: Diagnosis not present

## 2020-12-04 DIAGNOSIS — I2510 Atherosclerotic heart disease of native coronary artery without angina pectoris: Secondary | ICD-10-CM

## 2020-12-04 NOTE — Telephone Encounter (Signed)
[facility] Pre-operative Risk Assessment     Request for surgical clearance:     Endoscopy Procedure  What type of surgery is being performed?     EGD/Colonoscopy  When is this surgery scheduled?     01/05/21  What type of clearance is required ?   Pharmacy  Are there any medications that need to be held prior to surgery and how long? Eliquis x 2 days  Practice name and name of physician performing surgery?      [facility]  What is your office phone and fax number?      Phone- [phone]  [phone]  Anesthesia type (None, local, MAC, general) ?       MAC

## 2020-12-04 NOTE — Telephone Encounter (Signed)
   Primary Cardiologist: Truitt Merle, NP (Inactive)  Chart reviewed as part of pre-operative protocol coverage. Given past medical history and time since last visit, based on ACC/AHA guidelines, Sumit L Schlemmer would be at acceptable risk for the planned procedure without further cardiovascular testing.   Patient with diagnosis of A Fib on Eliquis for anticoagulation.     Procedure: EGD/Colonoscopy Date of procedure: 01/05/21     CHA2DS2-VASc Score = 6  This indicates a 9.7% annual risk of stroke. The patient's score is based upon: CHF History: Yes HTN History: Yes Diabetes History: Yes Stroke History: No Vascular Disease History: Yes Age Score: 2 Gender Score: 0      CrCl 86 mL/min Platelet count 350K   Per office protocol, patient can hold Eliquis for 2 days prior to procedure.   I will route this recommendation to the requesting party via Epic fax function and remove from pre-op pool.  Please call with questions.  [name]. Quenesha Douglass NP-C    12/04/2020, 3:16 PM [place] [address] Office [phone] Fax [phone]

## 2020-12-04 NOTE — Progress Notes (Signed)
History of Present Illness: This is a 75 year old male referred by [name], MD for the evaluation of recurrent anemia, epigastric pain, GERD.  He has a history of iron deficiency anemia that was evaluated in 2018.  Colonoscopy in November 2018 showed diverticulosis otherwise unremarkable.  EGD in November 2018 showed a small hiatal hernia, gastritis and an esophageal AVM.  Biopsies showed H. pylori gastritis which was treated with a PPI and Pylera for 14 days and duodenitis.  Findings of celiac disease were not noted.  Patient's recent hemoglobin had dropped slightly to 12.5.  He relates worsening problems with epigastric pain and reflux symptoms.  He relates problems with intermittent diarrhea occurring about every 2-3 months that he feels are related to nuts and possibly milk products.  No other gastrointestinal complaints. Denies weight loss,  constipation, change in stool caliber, melena, hematochezia, nausea, vomiting, dysphagia, chest pain.    Allergies  Allergen Reactions   Aspirin Other (See Comments)    History of GI bleed   Outpatient Medications Prior to Visit  Medication Sig Dispense Refill   apixaban (ELIQUIS) 2.5 MG TABS tablet Take by mouth 2 (two) times daily.     ascorbic acid (VITAMIN C) 1000 MG tablet Take 1,000 mg by mouth daily.     atorvastatin (LIPITOR) 20 MG tablet Take 1 tablet (20 mg total) by mouth every morning. 90 tablet 3   calcium carbonate (TUMS - DOSED IN MG ELEMENTAL CALCIUM) 500 MG chewable tablet Chew 1 tablet by mouth as needed for indigestion. Reported on 07/17/2015     fish oil-omega-3 fatty acids 1000 MG capsule Take 1 g by mouth daily.     losartan (COZAAR) 25 MG tablet Take 0.5 tablets (12.5 mg total) by mouth every morning. 90 tablet 3   metoprolol succinate (TOPROL-XL) 50 MG 24 hr tablet Take 1 tablet (50 mg total) by mouth daily. Take with or immediately following a meal. 90 tablet 1   Multiple Vitamin (MULTIVITAMIN) tablet Take 1 tablet by  mouth daily.     omeprazole (PRILOSEC) 20 MG capsule Take 1 capsule (20 mg total) by mouth 2 (two) times daily. 180 capsule 3   rivaroxaban (XARELTO) 20 MG TABS tablet Take 1 tablet (20 mg total) by mouth daily with supper. 30 tablet 6   tamsulosin (FLOMAX) 0.4 MG CAPS capsule Take 2 capsules (0.8 mg total) by mouth daily. 180 capsule 3   No facility-administered medications prior to visit.   Past Medical History:  Diagnosis Date   Allergy    SEASONAL   Anemia    Arthritis    KNEES   BPH (benign prostatic hyperplasia)    Diverticulosis of colon    Elevated PSA    1.22 on 01-20-2016   Feeling of incomplete bladder emptying    GERD (gastroesophageal reflux disease)    Hiatal hernia    History of adenomatous polyp of colon    2014 tubular adenoma   History of chronic gastritis    History of Helicobacter pylori infection    07/ 2016   History of lower GI bleeding    07/ 2010 and 12/ 2011  diverticular bleed both times   Hyperlipidemia    Hypertension    Lower urinary tract symptoms (LUTS)    Nonischemic cardiomyopathy (HCC)    Plantar fascia syndrome    Strains to urinate    INTERMITTANT   Type 2 diabetes, diet controlled ([place])    ON BORDER LINE,NEVER TOOK MEDICATION.  Wears glasses    Past Surgical History:  Procedure Laterality Date   APPENDECTOMY  teenager   CARDIAC CATHETERIZATION  09-10-2008   dr dalton mclean   mild non-obstructive CAD;  30-40% mRCA, 20%pLAD,  ef 55%   CARDIOVASCULAR STRESS TEST  07/29/2011   Low risk nuclear study w/ no ischemia or infarct/  LVEF 47% and  apical hypokinesis (08-07-2012 cardiac MRI -- EF 49% w/ no hyperenhancement distal septal and apical hypokinesis, mild LAE, mild RVE, mild AR with mild dilated sinus 24m)   CATARACT EXTRACTION W/ INTRAOCULAR LENS  IMPLANT, BILATERAL  2016   COLONOSCOPY  last one 12-21-2012   CYSTOSCOPY WITH INSERTION OF UROLIFT N/A 06/17/2016   Procedure: CYSTOSCOPY WITH INSERTION OF UROLIFT;  Surgeon: SCarolan Clines MD;  [facility]  Service: Urology;  Laterality: N/A;   ESOPHAGOGASTRODUODENOSCOPY  last one 11-22-2014   TRANSTHORACIC ECHOCARDIOGRAM  09/17/2014   ef 50-55%/  trivial AR, MR and PR/  mild LAE and RAE/  mild TR   Social History   Socioeconomic History   Marital status: Married    Spouse name: Not on file   Number of children: 0   Years of education: Not on file   Highest education level: Not on file  Occupational History   Occupation: truck driver- retired    [employer] farm manager RETIRED  Tobacco Use   Smoking status: Never   Smokeless tobacco: Never  Vaping Use   Vaping Use: Never used  Substance and Sexual Activity   Alcohol use: Yes    Comment: rarely drinks beer   Drug use: No   Sexual activity: Not Currently  Other Topics Concern   Not on file  Social History Narrative   Married 2003, no children.    Retired tAdministrator gets regular exercise- walking.    Step daughter [place] in [place]  Social Determinants of Health   Financial Resource Strain: Low Risk    Difficulty of Paying Living Expenses: Not very hard  Food Insecurity: Not on file  Transportation Needs: Not on file  Physical Activity: Not on file  Stress: Not on file  Social Connections: Not on file   Family History  Problem Relation Age of Onset   Colon polyps Brother    Colon cancer Mother 765  Lung cancer Brother    Heart disease Sister        MI, pace maker   Kidney disease Sister        renal failure, nephrectomy- not cancer   Hypertension Sister    Hypertension Sister    Diabetes Neg Hx        DM   Depression Neg Hx    Stroke Neg Hx    Alcohol abuse Neg Hx        also no drug abuse    Prostate cancer Neg Hx        Review of Systems: Pertinent positive and negative review of systems were noted in the above HPI section. All other review of systems were otherwise negative.    Physical Exam: General: Well developed, well nourished, no acute distress Head:  Normocephalic and atraumatic Eyes: Sclerae anicteric, EOMI Ears: Normal auditory acuity Mouth: Not examined, mask on during Covid-19 pandemic Neck: Supple, no masses or thyromegaly Lungs: Clear throughout to auscultation Heart: Regular rate and rhythm; no murmurs, rubs or bruits Abdomen: Soft, non tender and non distended. No masses, hepatosplenomegaly or hernias noted. Normal Bowel sounds Rectal: Deferred to colonoscopy  Musculoskeletal: Symmetrical with no gross deformities  Skin: No lesions on visible extremities Pulses:  Normal pulses noted Extremities: No clubbing, cyanosis, edema or deformities noted Neurological: Alert oriented x 4, grossly nonfocal Cervical Nodes:  No significant cervical adenopathy Inguinal Nodes: No significant inguinal adenopathy Psychological:  Alert and cooperative. Normal mood and affect   Assessment and Recommendations:  Recurrent IDA. Epigastric pain. GERD. R/O ulcer, AVM, esophagitis, neoplasm. Schedule colonoscopy, EGD. The risks (including bleeding, perforation, infection, missed lesions, medication reactions and possible hospitalization or surgery if complications occur), benefits, and alternatives to colonoscopy with possible biopsy and possible polypectomy were discussed with the patient and they consent to proceed. The risks (including bleeding, perforation, infection, missed lesions, medication reactions and possible hospitalization or surgery if complications occur), benefits, and alternatives to endoscopy with possible biopsy and possible dilation were discussed with the patient and they consent to proceed.   Intermittent loose stools.  Likely diet related to nuts and lactose products.  Advised to look for other food and beverage stressors. Family history of colon cancer, mother.  Personal history of adenomatous colon polyps.  Last colonoscopy without polyps.  Colonoscopy as above. Hold Eliquis / Xarelto. 2 days before procedure - will instruct when  and how to resume after procedure. Low but real risk of cardiovascular event such as heart attack, stroke, embolism, thrombosis or ischemia/infarct of other organs off  Eliquis / Xarelto explained and need to seek urgent help if this occurs. The patient consents to proceed. Will communicate by phone or EMR with patient's prescribing provider to confirm that holding Eliquis / Xarelto is reasonable in this case.   NICM. CAD.  Echocardiogram May 2021 showed EF 45 - 50%. DM.   cc: [name], MD [address] [place],  [place] [address]

## 2020-12-04 NOTE — Telephone Encounter (Signed)
Patient with diagnosis of A Fib on Eliquis for anticoagulation.    Procedure: EGD/Colonoscopy Date of procedure: 01/05/21   CHA2DS2-VASc Score = 6  This indicates a 9.7% annual risk of stroke. The patient's score is based upon: CHF History: Yes HTN History: Yes Diabetes History: Yes Stroke History: No Vascular Disease History: Yes Age Score: 2 Gender Score: 0    CrCl 86 mL/min Platelet count 350K  Per office protocol, patient can hold Eliquis for 2 days prior to procedure.

## 2020-12-04 NOTE — Patient Instructions (Addendum)
You have been scheduled for an endoscopy and colonoscopy. Please follow the written instructions given to you at your visit today. Please pick up your prep supplies at the pharmacy within the next 1-3 days. If you use inhalers (even only as needed), please bring them with you on the day of your procedure.  You will be contaced by our office prior to your procedure for directions on holding your Eliquis.  If you do not hear from our office 1 week prior to your scheduled procedure, please call [phone] to discuss.  Normal BMI (Body Mass Index- based on height and weight) is between 23 and 30. Your BMI today is Body mass index is 26.99 kg/m. [name] Please consider follow up  regarding your BMI with your Primary Care Provider.  Due to recent changes in healthcare laws, you may see the results of your imaging and laboratory studies on MyChart before your provider has had a chance to review them.  We understand that in some cases there may be results that are confusing or concerning to you. Not all laboratory results come back in the same time frame and the provider may be waiting for multiple results in order to interpret others.  Please give [place] 48 hours in order for your provider to thoroughly review all the results before contacting the office for clarification of your results.   Thank you for choosing me and [facility].  Pricilla Riffle. Dagoberto Ligas., MD., Marval Regal

## 2020-12-05 NOTE — Telephone Encounter (Signed)
Informed patient per Cardiology he can hold Eliquis 2 days prior to his procedure. Patient verbalized understanding.

## 2020-12-08 ENCOUNTER — Other Ambulatory Visit: Payer: Self-pay | Admitting: Gastroenterology

## 2020-12-08 ENCOUNTER — Telehealth: Payer: Self-pay | Admitting: Gastroenterology

## 2020-12-08 MED ORDER — PLENVU 140 G PO SOLR: 1.0000 | Freq: Once | ORAL | 0 refills | Status: AC

## 2020-12-08 NOTE — Addendum Note (Signed)
Addended by: Dorisann Frames L on: 12/08/2020 09:49 AM   Modules accepted: Orders

## 2020-12-08 NOTE — Telephone Encounter (Signed)
Prescription for Plenvu sent to [facility].

## 2020-12-10 ENCOUNTER — Telehealth: Payer: Self-pay | Admitting: Cardiology

## 2020-12-10 NOTE — Telephone Encounter (Signed)
Pt c/o medication issue:  1. Name of Medication: apixaban (ELIQUIS) 2.5 MG TABS tablet  2. How are you currently taking this medication (dosage and times per day)? As directed   3. Are you having a reaction (difficulty breathing--STAT)? no  4. What is your medication issue? Cost  Patient was denied for the Patient Assistance Program with [employer] for his Eliquis. The patient wanted to know what he should do now.

## 2020-12-10 NOTE — Telephone Encounter (Signed)
**Note De-Identified  Obfuscation** The pt has been denied pt asst for Eliquis through [employer].  I am forwarding this phone note to Dr [name] to advise if Xarelto would be an option for the pt as there is a discount program, [employer], maintenance that offers Xarelto at a more affordable cost for the pt. Prior to calling the pt back.

## 2020-12-10 NOTE — Telephone Encounter (Signed)
**Note De-Identified  Obfuscation** See phone note from 8/3 for updates.

## 2020-12-11 ENCOUNTER — Other Ambulatory Visit: Payer: Self-pay

## 2020-12-11 MED ORDER — RIVAROXABAN 20 MG PO TABS: 20.0000 mg | ORAL_TABLET | Freq: Every day | ORAL | Status: DC

## 2020-12-11 NOTE — Telephone Encounter (Signed)
**Note De-Identified  Obfuscation** The pt and I discussed him switching to Xarelto as they have a program called [employer] that he can enroll in that will charge him $85/30 day supply of Xarelto or $240/90 day supply. The pt states that this cost id still to expensive for him.  He states that he has enough Eliquis to last until the end of this month on hand.  He is requesting to be put on a generic anticoagulant. We discussed the only generic anticoagulant on the market at this time which is Warfarin (Generic for Coumadin) and he is interested in switching.  He is aware that I am forwarding this message to Dr [name] and our Pharmacy team and that someone from this office will be calling him later to discuss this switch from Eliquis/Xarelto to Warfarin.  He thanked me for our assistance.

## 2020-12-11 NOTE — Telephone Encounter (Signed)
Called and spoke to pt who stated that he is still taking Eliquis and has about 1 month supply left. Informed pt that Warfarin is another blood thinner and it could be cheaper but it will require ( INR) blood monitoring. Also, made him aware that it will require that he stays consistent with his vitamin K intake. Informed pt to give [place] a call at [phone], when he notices that he has a week left of Eliquis so we can look at switching him.  Pt verbalized understanding.   Side note, pt is to have a colonoscopy on 8/29.

## 2020-12-25 ENCOUNTER — Telehealth: Payer: Self-pay | Admitting: *Deleted

## 2020-12-25 NOTE — Telephone Encounter (Signed)
Pt called to report he is running low on his Eliquis and has more than a week worth left. He states he was not at home and unsure of the full amount so it could be a little more or less. Assessed the chart for more information and per previous note on 12/10/20, pt will be switching to Warfarin from Eliquis. Also, pt reports he will have a  colonoscopy on 01/05/21 and will be holding his Eliquis for 3 days so he will have that amount left over as well. Pt will be back home tomorrow and will call back to state the correct amount. Made him aware that we will want to overlap the 2 meds for 3 days. He has a Cardiology Appt on 8/30 with Dr. Quentin Ore for [facility] Consult & 8/31 with Ermalinda Barrios for follow-up.   Advised that we ned to know since he will need to overlap three days with both and then have INR check done in the office within 5-7 days after starting Warfarin. Also, the prescription would need to be sent.    Pt states he would call back tomorrow once he is back home to update [place] with correct pill amount.

## 2020-12-31 NOTE — Telephone Encounter (Signed)
Returned a call to the pt and advised that I needed to know how much Eliquis he has left. He stated after tonight he would have 9 tablets left. Advised that we would need to do a 3 day overlap with the two meds. As I was going over the instructions for overlapping, he asked about the cost of the other med they said he could take, clarified and noted it is Xarelto that he was talking about. Advised per the conversation on 12/10/2020 with Jeani Hawking, the cost was $85/30 day supply and $240/90 day supply. He stated his daughter is a doctor and prefers he not take warfarin and try Xarelto. Advised that is okay per previous conversation but the price is through a special program and I would need to let Jeani Hawking know as there is paperwork involved and he stated he is sure he could afford a 3 month supply and calculated the amount it would cost over a year. He states his daughter could assist with that. He is aware I will send this message back to Palmer and she would contact him as well. He stated since it is a couple days he is willing to buy a month supply if needed at a local pharmacy.

## 2021-01-01 ENCOUNTER — Other Ambulatory Visit: Payer: Self-pay

## 2021-01-01 MED ORDER — RIVAROXABAN 20 MG PO TABS: 20.0000 mg | ORAL_TABLET | Freq: Every day | ORAL | 3 refills | Status: DC

## 2021-01-01 NOTE — Telephone Encounter (Signed)
**Note De-Identified  Obfuscation** I gave the pt [employer] phone number so he can call them to sign up for their Xarelto program. He states that he is calling them now and I advised him that I will e-scribe his Xarelto 20 mg # 90 with 3 refills to [facility]  He is aware to call me if/when he gets down to his last 2 days of Eliquis so we can provide him with either Xarelto 20 mg samples or Eliquis 5 mg samples if his Xarelto does not arrive before he runs out.  He thanked me for our assistance.

## 2021-01-05 ENCOUNTER — Encounter: Payer: Self-pay | Admitting: Gastroenterology

## 2021-01-05 ENCOUNTER — Ambulatory Visit (AMBULATORY_SURGERY_CENTER): Payer: Medicare Other | Admitting: Gastroenterology

## 2021-01-05 ENCOUNTER — Other Ambulatory Visit: Payer: Self-pay

## 2021-01-05 VITALS — BP 119/68 | HR 64 | Temp 98.6°F | Resp 13 | Ht 72.0 in | Wt 199.0 lb

## 2021-01-05 DIAGNOSIS — R1013 Epigastric pain: Secondary | ICD-10-CM

## 2021-01-05 DIAGNOSIS — K2970 Gastritis, unspecified, without bleeding: Secondary | ICD-10-CM

## 2021-01-05 DIAGNOSIS — D509 Iron deficiency anemia, unspecified: Secondary | ICD-10-CM | POA: Diagnosis not present

## 2021-01-05 DIAGNOSIS — D120 Benign neoplasm of cecum: Secondary | ICD-10-CM | POA: Diagnosis not present

## 2021-01-05 DIAGNOSIS — K219 Gastro-esophageal reflux disease without esophagitis: Secondary | ICD-10-CM

## 2021-01-05 DIAGNOSIS — E119 Type 2 diabetes mellitus without complications: Secondary | ICD-10-CM | POA: Diagnosis not present

## 2021-01-05 DIAGNOSIS — K5140 Inflammatory polyps of colon without complications: Secondary | ICD-10-CM

## 2021-01-05 DIAGNOSIS — D125 Benign neoplasm of sigmoid colon: Secondary | ICD-10-CM | POA: Diagnosis not present

## 2021-01-05 DIAGNOSIS — K31819 Angiodysplasia of stomach and duodenum without bleeding: Secondary | ICD-10-CM | POA: Diagnosis not present

## 2021-01-05 DIAGNOSIS — Z86010 Personal history of colonic polyps: Secondary | ICD-10-CM

## 2021-01-05 DIAGNOSIS — B9681 Helicobacter pylori [H. pylori] as the cause of diseases classified elsewhere: Secondary | ICD-10-CM | POA: Diagnosis not present

## 2021-01-05 DIAGNOSIS — K5730 Diverticulosis of large intestine without perforation or abscess without bleeding: Secondary | ICD-10-CM

## 2021-01-05 DIAGNOSIS — K2960 Other gastritis without bleeding: Secondary | ICD-10-CM | POA: Diagnosis not present

## 2021-01-05 MED ORDER — SODIUM CHLORIDE 0.9 % IV SOLN: 500.0000 mL | Freq: Once | INTRAVENOUS | Status: DC

## 2021-01-05 NOTE — Op Note (Signed)
[facility] Patient Name: Paul Fry Procedure Date: 01/05/2021 7:27 AM MRN: [id number] Endoscopist: Ladene Artist , MD Age: 75 Referring MD:  Date of Birth: [birth year] Gender: Male Account #: [id number] Procedure:                Upper GI endoscopy Indications:              Epigastric abdominal pain, Iron deficiency anemia,                            Gastro-esophageal reflux disease Medicines:                Monitored Anesthesia Care Procedure:                Pre-Anesthesia Assessment:                           - Prior to the procedure, a History and Physical                            was performed, and patient medications and                            allergies were reviewed. The patient's tolerance of                            previous anesthesia was also reviewed. The risks                            and benefits of the procedure and the sedation                            options and risks were discussed with the patient.                            All questions were answered, and informed consent                            was obtained. Prior Anticoagulants: The patient has                            taken Eliquis (apixaban), last dose was 2 days                            prior to procedure. ASA Grade Assessment: III - A                            patient with severe systemic disease. After                            reviewing the risks and benefits, the patient was                            deemed in satisfactory condition to undergo the  procedure.                           After obtaining informed consent, the endoscope was                            passed under direct vision. Throughout the                            procedure, the patient's blood pressure, pulse, and                            oxygen saturations were monitored continuously. The                            Endoscope was introduced through the mouth, and                             advanced to the second part of duodenum. The upper                            GI endoscopy was accomplished without difficulty.                            The patient tolerated the procedure well. 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                           The exam of the esophagus was otherwise normal.                           Patchy mildly erythematous mucosa without bleeding                            was found in the gastric body. Biopsies were taken                            with a cold forceps for histology.                           A small hiatal hernia was present.                           The exam of the stomach was otherwise normal.                           The duodenal bulb and second portion of the                            duodenum were normal.  Complications:            No immediate complications. Estimated Blood Loss:     Estimated blood loss was minimal. 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                           - Erythematous mucosa in the gastric body. Biopsied.                           - Small hiatal hernia.                           - Normal duodenal bulb and second portion of the                            duodenum. Recommendation:           - Patient has a contact number available for                            emergencies. The signs and symptoms of potential                            delayed complications were discussed with the                            patient. Return to normal activities tomorrow.                            Written discharge instructions were provided to the                            patient.                           - Resume previous diet.                           - Resume Eliquis (apixaban) at prior dose in 3                            days. Refer to managing physician for  further                            adjustment of therapy.                           - Await pathology results.                           - IDA could from the AVM. Recommend iron                            replacement long term as inidicated. Ladene Artist, MD 01/05/2021 9:00:40 AM This report has been signed electronically.

## 2021-01-05 NOTE — Patient Instructions (Signed)
Repeat Colonoscopy for surveillance in 6 months after piecemeal polypectomy with a more extensive bowel prep. Resume Eliquis in 3 days at prior dose. High fiber diet, continue present medications. Awaiting pathology results. No Asprin, ibuprofen, naproxen or other non-steroidal anti-inflammatory drugs for 2 weeks after polyp removal. IDA could be from AVM. Recommend iron replacement long term as indicated. YOU HAD AN ENDOSCOPIC PROCEDURE TODAY AT [facility]:   Refer to the procedure report that was given to you for any specific questions about what was found during the examination.  If the procedure report does not answer your questions, please call your gastroenterologist to clarify.  If you requested that your care partner not be given the details of your procedure findings, then the procedure report has been included in a sealed envelope for you to review at your convenience later.  YOU SHOULD EXPECT: Some feelings of bloating in the abdomen. Passage of more gas than usual.  Walking can help get rid of the air that was put into your GI tract during the procedure and reduce the bloating. If you had a lower endoscopy (such as a colonoscopy or flexible sigmoidoscopy) you may notice spotting of blood in your stool or on the toilet paper. If you underwent a bowel prep for your procedure, you may not have a normal bowel movement for a few days.  Please Note:  You might notice some irritation and congestion in your nose or some drainage.  This is from the oxygen used during your procedure.  There is no need for concern and it should clear up in a day or so.  SYMPTOMS TO REPORT IMMEDIATELY:  Following lower endoscopy (colonoscopy or flexible sigmoidoscopy):  Excessive amounts of blood in the stool  Significant tenderness or worsening of abdominal pains  Swelling of the abdomen that is new, acute  Fever of 100F or higher  Following upper endoscopy (EGD)  Vomiting of blood or coffee  ground material  New chest pain or pain under the shoulder blades  Painful or persistently difficult swallowing  New shortness of breath  Fever of 100F or higher  Black, tarry-looking stools  For urgent or emergent issues, a gastroenterologist can be reached at any hour by calling [phone]. Do not use MyChart messaging for urgent concerns.    DIET:  We do recommend a small meal at first, but then you may proceed to your regular diet.  Drink plenty of fluids but you should avoid alcoholic beverages for 24 hours.  ACTIVITY:  You should plan to take it easy for the rest of today and you should NOT DRIVE or use heavy machinery until tomorrow (because of the sedation medicines used during the test).    FOLLOW UP: Our staff will call the number listed on your records 48-72 hours following your procedure to check on you and address any questions or concerns that you may have regarding the information given to you following your procedure. If we do not reach you, we will leave a message.  We will attempt to reach you two times.  During this call, we will ask if you have developed any symptoms of COVID 19. If you develop any symptoms (ie: fever, flu-like symptoms, shortness of breath, cough etc.) before then, please call [phone].  If you test positive for Covid 19 in the 2 weeks post procedure, please call and report this information to [place].    If any biopsies were taken you will be contacted by phone or by  letter within the next 1-3 weeks.  Please call us at [phone] if you have not heard about the biopsies in 3 weeks.    SIGNATURES/CONFIDENTIALITY: You and/or your care partner have signed paperwork which will be entered into your electronic medical record.  These signatures attest to the fact that that the information above on your After Visit Summary has been reviewed and is understood.  Full responsibility of the confidentiality of this discharge information lies with you and/or  your care-partner.

## 2021-01-05 NOTE — Progress Notes (Signed)
Called to room to assist during endoscopic procedure.  Patient ID and intended procedure confirmed with present staff. Received instructions for my participation in the procedure from the performing physician.  

## 2021-01-05 NOTE — Progress Notes (Signed)
Vitals-CW  History reviewed. 

## 2021-01-05 NOTE — Progress Notes (Signed)
[id number] Robinul 0.1 mg IV given due large amount of secretions upon assessment.  MD made aware, vss

## 2021-01-05 NOTE — Op Note (Addendum)
[facility] Patient Name: Paul Fry Procedure Date: 01/05/2021 7:28 AM MRN: [id number] Endoscopist: Ladene Artist , MD Age: 75 Referring MD:  Date of Birth: Jun 12, 1945 Gender: Male Account #: [id number] Procedure:                Colonoscopy Indications:              Iron deficiency anemia. Personal history of                            adenomatous colon polyps. Family history of colon                            cancer. Medicines:                Monitored Anesthesia Care Procedure:                Pre-Anesthesia Assessment:                           - Prior to the procedure, a History and Physical                            was performed, and patient medications and                            allergies were reviewed. The patient's tolerance of                            previous anesthesia was also reviewed. The risks                            and benefits of the procedure and the sedation                            options and risks were discussed with the patient.                            All questions were answered, and informed consent                            was obtained. Prior Anticoagulants: The patient has                            taken Eliquis (apixaban), last dose was 2 days                            prior to procedure. ASA Grade Assessment: III - A                            patient with severe systemic disease. After                            reviewing the risks and benefits, the patient was  deemed in satisfactory condition to undergo the                            procedure.                           After obtaining informed consent, the colonoscope                            was passed under direct vision. Throughout the                            procedure, the patient's blood pressure, pulse, and                            oxygen saturations were monitored continuously. The                            Olympus CF-HQ190L  [id number]) Colonoscope was                            introduced through the anus and advanced to the the                            cecum, identified by appendiceal orifice and                            ileocecal valve. The ileocecal valve, appendiceal                            orifice, and rectum were photographed. The quality                            of the bowel preparation was fair. The colonoscopy                            was performed without difficulty. The patient                            tolerated the procedure well. Scope In: 8:13:35 AM Scope Out: 8:39:55 AM Total Procedure Duration: 0 hours 26 minutes 20 seconds  Findings:                 The perianal and digital rectal examinations were                            normal.                           A 20 mm polyp was found in the cecum. The polyp was                            sessile. The polyp was removed with a piecemeal  technique using a cold snare. Resection and                            retrieval were complete.                           A 5 mm polyp was found in the sigmoid colon. The                            polyp was sessile, friable. Unable to engage with a                            snare and required multiple bites with the foceps                            to remove. The polyp was removed with a cold biopsy                            forceps. Resection and retrieval were complete.                            Area 5 cm distal to the polypectomy site was                            tattooed with an injection of 1.5 mL of Spot                            (carbon black).                           Scattered medium-mouthed diverticula were found in                            the right colon. There was no evidence of                            diverticular bleeding.                           Multiple medium-mouthed diverticula were found in                            the left colon. There  was narrowing of the colon in                            association with the diverticular opening. There                            was evidence of diverticular spasm. There was no                            evidence of diverticular bleeding.  The exam was otherwise without abnormality on                            direct and retroflexion views. Complications:            No immediate complications. Estimated blood loss:                            None. Estimated Blood Loss:     Estimated blood loss: none. Impression:               - Preparation of the colon was fair.                           - One 20 mm polyp in the cecum, removed with a cold                            snare, removed piecemeal using a cold snare.                            Resected and retrieved.                           - One 5 mm polyp in the sigmoid colon, removed with                            a cold biopsy forceps. Resected and retrieved.                            Tattooed.                           - Mild diverticulosis in the right colon. There was                            no evidence of diverticular bleeding.                           - Severe diverticulosis in the left colon. There                            was narrowing of the colon in association with the                            diverticular opening. There was evidence of                            diverticular spasm. There was no evidence of                            diverticular bleeding.                           - The examination was otherwise normal on direct  and retroflexion views. Recommendation:           - Repeat colonoscopy in 6 months for surveillance                            after piecemeal polypectomy with a more extensive                            bowel prep.                           - Resume Eliquis (apixaban) in 3 days at prior                            dose. Refer to managing  physician for further                            adjustment of therapy.                           - Patient has a contact number available for                            emergencies. The signs and symptoms of potential                            delayed complications were discussed with the                            patient. Return to normal activities tomorrow.                            Written discharge instructions were provided to the                            patient.                           - High fiber diet.                           - Continue present medications.                           - Await pathology results.                           - No aspirin, ibuprofen, naproxen, or other                            non-steroidal anti-inflammatory drugs for 2 weeks                            after polyp removal. Ladene Artist, MD 01/05/2021 8:55:31 AM This report has been signed electronically.

## 2021-01-05 NOTE — Progress Notes (Deleted)
Cardiology Office Note    Date:  01/05/2021   ID:  Paul Fry, DOB Mar 08, 1946, MRN [id number]   PCP:  [name], MD   [facility]  Cardiologist:  Truitt Merle, NP (Inactive) *** Advanced Practice Provider:  No care team member to display Electrophysiologist:  None   [phone]   No chief complaint on file.   History of Present Illness:  Paul Fry is a 75 y.o. male with history of nonobstructive CAD on cardiac cath 2010 EF 30% at that time, nonischemic cardiomyopathy EF improved to 50%, cardiac MRI 2016 EF 49% with some mild noncompaction in the apical regions, prior GI bleed.  Patient saw Dr. [name] in follow-up 11/18/2020 and was found to be in atrial fibrillation on EKG.  He was concerned because of prior GI bleed and the patient was not on aspirin.  He referred him to Dr. Quentin Ore for the potential watchman device.  Eliquis 5 mg twice daily was started as well as c metoprolol 50 mg daily and carvedilol was stopped.  Consider cardioversion at follow-up visit.  Eliquis was not affordable so we will switch to Xarelto.  Patient saw Dr.'s Fuller Plan 01/05/2021 for epigastric pain chronic anemia and loose stools and was scheduled for colonoscopy and endoscopy.  He was asked to hold Eliquis or Xarelto 2 days prior to procedure.  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  Colonoscopy 20 mm polyp removed 5 mm polyp removed mild diverticulosis of the right colon severe diverticulosis of the left colon no evidence of diverticular bleeding.  Was allowed to resume Eliquis 3 days after procedure   Past Medical History:  Diagnosis Date   Allergy    SEASONAL   Anemia    Arthritis    KNEES   BPH (benign prostatic hyperplasia)    Diverticulosis of colon    Elevated PSA    1.22 on 01-20-2016   Feeling of incomplete bladder emptying    GERD (gastroesophageal reflux disease)     Hiatal hernia    History of adenomatous polyp of colon    2014 tubular adenoma   History of chronic gastritis    History of Helicobacter pylori infection    07/ 2016   History of lower GI bleeding    07/ 2010 and 12/ 2011  diverticular bleed both times   Hyperlipidemia    Hypertension    Lower urinary tract symptoms (LUTS)    Nonischemic cardiomyopathy (HCC)    Plantar fascia syndrome    Strains to urinate    INTERMITTANT   Type 2 diabetes, diet controlled ([place])    ON BORDER LINE,NEVER TOOK MEDICATION.   Wears glasses     Past Surgical History:  Procedure Laterality Date   APPENDECTOMY  teenager   CARDIAC CATHETERIZATION  09-10-2008   dr dalton mclean   mild non-obstructive CAD;  30-40% mRCA, 20%pLAD,  ef 55%   CARDIOVASCULAR STRESS TEST  07/29/2011   Low risk nuclear study w/ no ischemia or infarct/  LVEF 47% and  apical hypokinesis (08-07-2012 cardiac MRI -- EF 49% w/ no hyperenhancement distal septal and apical hypokinesis, mild LAE, mild RVE, mild AR with mild dilated sinus 42m)   CATARACT EXTRACTION W/ INTRAOCULAR LENS  IMPLANT, BILATERAL  2016   COLONOSCOPY  last one 12-21-2012   CYSTOSCOPY WITH INSERTION OF UROLIFT N/A 06/17/2016   Procedure: CYSTOSCOPY WITH INSERTION OF UROLIFT;  Surgeon: [name], MD;  Location: [facility];  Service: Urology;  Laterality: N/A;   ESOPHAGOGASTRODUODENOSCOPY  last one 11-22-2014   TRANSTHORACIC ECHOCARDIOGRAM  09/17/2014   ef 50-55%/  trivial AR, MR and PR/  mild LAE and RAE/  mild TR    Current Medications: No outpatient medications have been marked as taking for the 01/07/21 encounter (Appointment) with Imogene Burn, PA-C.   Current Facility-Administered Medications for the 01/07/21 encounter (Appointment) with Imogene Burn, PA-C  Medication   0.9 %  sodium chloride infusion     Allergies:   Aspirin   Social History   Socioeconomic History   Marital status: Married    Spouse name: Not on file    Number of children: 0   Years of education: Not on file   Highest education level: Not on file  Occupational History   Occupation: truck driver- retired    Fish farm manager: RETIRED  Tobacco Use   Smoking status: Never   Smokeless tobacco: Never  Vaping Use   Vaping Use: Never used  Substance and Sexual Activity   Alcohol use: Yes    Comment: rarely drinks beer   Drug use: No   Sexual activity: Not Currently  Other Topics Concern   Not on file  Social History Narrative   Married 2003, no children.    Retired Administrator, gets regular exercise- walking.    Step daughter is MD in [place]   Social Determinants of Health   Financial Resource Strain: Low Risk    Difficulty of Paying Living Expenses: Not very hard  Food Insecurity: Not on file  Transportation Needs: Not on file  Physical Activity: Not on file  Stress: Not on file  Social Connections: Not on file     Family History:  The patient's ***family history includes Colon cancer (age of onset: 39) in his mother; Colon polyps in his brother; Heart disease in his sister; Hypertension in his sister and sister; Kidney disease in his sister; Lung cancer in his brother.   ROS:   Please see the history of present illness.    ROS All other systems reviewed and are negative.   PHYSICAL EXAM:   VS:  There were no vitals taken for this visit.  Physical Exam  GEN: Well nourished, well developed, in no acute distress  HEENT: normal  Neck: no JVD, carotid bruits, or masses Cardiac:RRR; no murmurs, rubs, or gallops  Respiratory:  clear to auscultation bilaterally, normal work of breathing GI: soft, nontender, nondistended, + BS Ext: without cyanosis, clubbing, or edema, Good distal pulses bilaterally MS: no deformity or atrophy  Skin: warm and dry, no rash Neuro:  Alert and Oriented x 3, Strength and sensation are intact Psych: euthymic mood, full affect  Wt Readings from Last 3 Encounters:  01/05/21 199 lb (90.3 kg)  12/04/20  199 lb (90.3 kg)  11/18/20 201 lb (91.2 kg)      Studies/Labs Reviewed:   EKG:  EKG is*** ordered today.  The ekg ordered today demonstrates ***  Recent Labs: 10/16/2020: ALT 19; BUN 13; Creatinine, Ser 0.95; Potassium 4.5; Sodium 139 10/23/2020: Hemoglobin 12.5; Platelets 350.0   Lipid Panel    Component Value Date/Time   CHOL 129 10/16/2020 0904   TRIG 115.0 10/16/2020 0904   HDL 30.40 (L) 10/16/2020 0904   CHOLHDL 4 10/16/2020 0904   VLDL 23.0 10/16/2020 0904   LDLCALC 76 10/16/2020 0904    Additional studies/ records that were reviewed today include:  ***  Risk Assessment/Calculations:   {Does this patient have ATRIAL FIBRILLATION?[phone]}     ASSESSMENT:    No diagnosis found.   PLAN:  In order of problems listed above:  PAF on Eliquis but switching to Xarelto because of cost-increased risk of bleeding  NICM EF 45-50% on echo 09/2019  Nonobstructive CAD  History of GI bleeds/anemia/severe diverticulosis on colonoscopy and endo yest  Shared Decision Making/Informed Consent   {Are you ordering a CV Procedure (e.g. stress test, cath, DCCV, TEE, etc)?   Press F2        :[id number]    Medication Adjustments/Labs and Tests Ordered: Current medicines are reviewed at length with the patient today.  Concerns regarding medicines are outlined above.  Medication changes, Labs and Tests ordered today are listed in the Patient Instructions below. There are no Patient Instructions on file for this visit.   Sumner Boast, PA-C  01/05/2021 3:39 PM    [facility] [place], [place],   [address] Phone: [phone]; Fax: [phone]

## 2021-01-05 NOTE — Progress Notes (Signed)
History of Present Illness: This is a 75 year old male referred by [name], MD for the evaluation of recurrent anemia, epigastric pain, GERD.  He has a history of iron deficiency anemia that was evaluated in 2018.  Colonoscopy in November 2018 showed diverticulosis otherwise unremarkable.  EGD in November 2018 showed a small hiatal hernia, gastritis and an esophageal AVM.  Biopsies showed H. pylori gastritis which was treated with a PPI and Pylera for 14 days and duodenitis.  Findings of celiac disease were not noted.  Patient's recent hemoglobin had dropped slightly to 12.5.  He relates worsening problems with epigastric pain and reflux symptoms.  He relates problems with intermittent diarrhea occurring about every 2-3 months that he feels are related to nuts and possibly milk products.  No other gastrointestinal complaints. Denies weight loss,  constipation, change in stool caliber, melena, hematochezia, nausea, vomiting, dysphagia, chest pain.            Allergies  Allergen Reactions   Aspirin Other (See Comments)      History of GI bleed          Outpatient Medications Prior to Visit  Medication Sig Dispense Refill   apixaban (ELIQUIS) 2.5 MG TABS tablet Take by mouth 2 (two) times daily.       ascorbic acid (VITAMIN C) 1000 MG tablet Take 1,000 mg by mouth daily.       atorvastatin (LIPITOR) 20 MG tablet Take 1 tablet (20 mg total) by mouth every morning. 90 tablet 3   calcium carbonate (TUMS - DOSED IN MG ELEMENTAL CALCIUM) 500 MG chewable tablet Chew 1 tablet by mouth as needed for indigestion. Reported on 07/17/2015       fish oil-omega-3 fatty acids 1000 MG capsule Take 1 g by mouth daily.       losartan (COZAAR) 25 MG tablet Take 0.5 tablets (12.5 mg total) by mouth every morning. 90 tablet 3   metoprolol succinate (TOPROL-XL) 50 MG 24 hr tablet Take 1 tablet (50 mg total) by mouth daily. Take with or immediately following a meal. 90 tablet 1   Multiple Vitamin (MULTIVITAMIN)  tablet Take 1 tablet by mouth daily.       omeprazole (PRILOSEC) 20 MG capsule Take 1 capsule (20 mg total) by mouth 2 (two) times daily. 180 capsule 3   rivaroxaban (XARELTO) 20 MG TABS tablet Take 1 tablet (20 mg total) by mouth daily with supper. 30 tablet 6   tamsulosin (FLOMAX) 0.4 MG CAPS capsule Take 2 capsules (0.8 mg total) by mouth daily. 180 capsule 3    No facility-administered medications prior to visit.        Past Medical History:  Diagnosis Date   Allergy      SEASONAL   Anemia     Arthritis      KNEES   BPH (benign prostatic hyperplasia)     Diverticulosis of colon     Elevated PSA      1.22 on 01-20-2016   Feeling of incomplete bladder emptying     GERD (gastroesophageal reflux disease)     Hiatal hernia     History of adenomatous polyp of colon      2014 tubular adenoma   History of chronic gastritis     History of Helicobacter pylori infection      07/ 2016   History of lower GI bleeding      07/ 2010 and 12/ 2011  diverticular bleed both times   Hyperlipidemia  Hypertension     Lower urinary tract symptoms (LUTS)     Nonischemic cardiomyopathy (HCC)     Plantar fascia syndrome     Strains to urinate      INTERMITTANT   Type 2 diabetes, diet controlled (HCC)      ON BORDER LINE,NEVER TOOK MEDICATION.   Wears glasses           Past Surgical History:  Procedure Laterality Date   APPENDECTOMY   teenager   CARDIAC CATHETERIZATION   09-10-2008   dr dalton mclean    mild non-obstructive CAD;  30-40% mRCA, 20%pLAD,  ef 55%   CARDIOVASCULAR STRESS TEST   07/29/2011    Low risk nuclear study w/ no ischemia or infarct/  LVEF 47% and  apical hypokinesis (08-07-2012 cardiac MRI -- EF 49% w/ no hyperenhancement distal septal and apical hypokinesis, mild LAE, mild RVE, mild AR with mild dilated sinus 15m)   CATARACT EXTRACTION W/ INTRAOCULAR LENS  IMPLANT, BILATERAL   2016   COLONOSCOPY   last one 12-21-2012   CYSTOSCOPY WITH INSERTION OF UROLIFT N/A  06/17/2016    Procedure: CYSTOSCOPY WITH INSERTION OF UROLIFT;  Surgeon: SCarolan Clines MD;  [facility] [facility]  Service: Urology;  Laterality: N/A;   ESOPHAGOGASTRODUODENOSCOPY   last one 11-22-2014   TRANSTHORACIC ECHOCARDIOGRAM   09/17/2014    ef 50-55%/  trivial AR, MR and PR/  mild LAE and RAE/  mild TR    Social History         Socioeconomic History   Marital status: Married      Spouse name: Not on file   Number of children: 0   Years of education: Not on file   Highest education level: Not on file  Occupational History   Occupation: truck driver- retired      EFish farm manager RETIRED  Tobacco Use   Smoking status: Never   Smokeless tobacco: Never  Vaping Use   Vaping Use: Never used  Substance and Sexual Activity   Alcohol use: Yes      Comment: rarely drinks beer   Drug use: No   Sexual activity: Not Currently  Other Topics Concern   Not on file  Social History Narrative    Married 2003, no children.     Retired tAdministrator gets regular exercise- walking.     Step [place] MD [place]   Social Determinants of Health       Financial Resource Strain: Low Risk    Difficulty of Paying Living Expenses: Not very hard  Food Insecurity: Not on file  Transportation Needs: Not on file  Physical Activity: Not on file  Stress: Not on file  Social Connections: Not on file         Family History  Problem Relation Age of Onset   Colon polyps Brother     Colon cancer Mother 715  Lung cancer Brother     Heart disease Sister          MI, pace maker   Kidney disease Sister          renal failure, nephrectomy- not cancer   Hypertension Sister     Hypertension Sister     Diabetes Neg Hx          DM   Depression Neg Hx     Stroke Neg Hx     Alcohol abuse Neg Hx          also no drug  abuse    Prostate cancer Neg Hx           Review of Systems: Pertinent positive and negative review of systems were noted in the above HPI section. All other  review of systems were otherwise negative.       Physical Exam: General: Well developed, well nourished, no acute distress Head: Normocephalic and atraumatic Eyes: Sclerae anicteric, EOMI Ears: Normal auditory acuity Mouth: Not examined, mask on during Covid-19 pandemic Neck: Supple, no masses or thyromegaly Lungs: Clear throughout to auscultation Heart: Regular rate and rhythm; no murmurs, rubs or bruits Abdomen: Soft, non tender and non distended. No masses, hepatosplenomegaly or hernias noted. Normal Bowel sounds Rectal: Deferred to colonoscopy  Musculoskeletal: Symmetrical with no gross deformities  Skin: No lesions on visible extremities Pulses:  Normal pulses noted Extremities: No clubbing, cyanosis, edema or deformities noted Neurological: Alert oriented x 4, grossly nonfocal Cervical Nodes:  No significant cervical adenopathy Inguinal Nodes: No significant inguinal adenopathy Psychological:  Alert and cooperative. Normal mood and affect     Assessment and Recommendations:   Recurrent IDA. Epigastric pain. GERD. R/O ulcer, AVM, esophagitis, neoplasm. Schedule colonoscopy, EGD. The risks (including bleeding, perforation, infection, missed lesions, medication reactions and possible hospitalization or surgery if complications occur), benefits, and alternatives to colonoscopy with possible biopsy and possible polypectomy were discussed with the patient and they consent to proceed. The risks (including bleeding, perforation, infection, missed lesions, medication reactions and possible hospitalization or surgery if complications occur), benefits, and alternatives to endoscopy with possible biopsy and possible dilation were discussed with the patient and they consent to proceed.   Intermittent loose stools.  Likely diet related to nuts and lactose products.  Advised to look for other food and beverage stressors. Family history of colon cancer, mother.  Personal history of adenomatous  colon polyps.  Last colonoscopy without polyps.  Colonoscopy as above. Hold Eliquis / Xarelto. 2 days before procedure - will instruct when and how to resume after procedure. Low but real risk of cardiovascular event such as heart attack, stroke, embolism, thrombosis or ischemia/infarct of other organs off  Eliquis / Xarelto explained and need to seek urgent help if this occurs. The patient consents to proceed. Will communicate by phone or EMR with patient's prescribing provider to confirm that holding Eliquis / Xarelto is reasonable in this case.   NICM. CAD.  Echocardiogram May 2021 showed EF 45 - 50%. DM.  This patient is appropriate for endoscopic procedures in the ambulatory setting.    Pricilla Riffle. Fuller Plan MD

## 2021-01-05 NOTE — Progress Notes (Signed)
Report given to PACU, vss 

## 2021-01-06 ENCOUNTER — Other Ambulatory Visit: Payer: Self-pay | Admitting: Family Medicine

## 2021-01-06 ENCOUNTER — Ambulatory Visit (INDEPENDENT_AMBULATORY_CARE_PROVIDER_SITE_OTHER): Payer: Medicare Other

## 2021-01-06 ENCOUNTER — Ambulatory Visit (INDEPENDENT_AMBULATORY_CARE_PROVIDER_SITE_OTHER): Payer: Medicare Other | Admitting: Cardiology

## 2021-01-06 ENCOUNTER — Ambulatory Visit: Payer: Medicare Other | Admitting: Physician Assistant

## 2021-01-06 VITALS — BP 148/80 | HR 71 | Ht 72.0 in | Wt 199.0 lb

## 2021-01-06 DIAGNOSIS — I4891 Unspecified atrial fibrillation: Secondary | ICD-10-CM

## 2021-01-06 DIAGNOSIS — I10 Essential (primary) hypertension: Secondary | ICD-10-CM

## 2021-01-06 DIAGNOSIS — I2510 Atherosclerotic heart disease of native coronary artery without angina pectoris: Secondary | ICD-10-CM

## 2021-01-06 DIAGNOSIS — I428 Other cardiomyopathies: Secondary | ICD-10-CM

## 2021-01-06 DIAGNOSIS — K922 Gastrointestinal hemorrhage, unspecified: Secondary | ICD-10-CM

## 2021-01-06 DIAGNOSIS — D649 Anemia, unspecified: Secondary | ICD-10-CM

## 2021-01-06 NOTE — Progress Notes (Unsigned)
Patient enrolled for [employer] to mail a ZIO XT monitor to his address on file.

## 2021-01-06 NOTE — Progress Notes (Signed)
Called patient and scheduled lab appt for 01/13/21 at 8:25 am.

## 2021-01-06 NOTE — Patient Instructions (Addendum)
Medication Instructions:  Your physician recommends that you continue on your current medications as directed. Please refer to the Current Medication list given to you today.  Labwork: None ordered.  Testing/Procedures: Your physician has recommended that you wear a heart monitor. Heart monitors are medical devices that record the heart's electrical activity. Doctors most often use these monitors to diagnose arrhythmias. Arrhythmias are problems with the speed or rhythm of the heartbeat. The monitor is a small, portable device. You can wear one while you do your normal daily activities. This is usually used to diagnose what is causing palpitations/syncope (passing out).   Follow-Up: Your physician wants you to follow-up in: 02/23/21 8:30 am with  Lars Mage, MD    Any Other Special Instructions Will Be Listed Below (If Applicable).  If you need a refill on your cardiac medications before your next appointment, please call your pharmacy.   ZIO XT- Long Term Monitor Instructions  Your physician has requested you wear a ZIO patch monitor for 14 days.  This is a single patch monitor. [employer] supplies one patch monitor per enrollment. Additional stickers are not available. Please do not apply patch if you will be having a Nuclear Stress Test,  Echocardiogram, Cardiac CT, MRI, or Chest Xray during the period you would be wearing the  monitor. The patch cannot be worn during these tests. You cannot remove and re-apply the  ZIO XT patch monitor.  Your ZIO patch monitor will be mailed 3 day USPS to your address on file. It may take 3-5 days  to receive your monitor after you have been enrolled.  Once you have received your monitor, please review the enclosed instructions. Your monitor  has already been registered assigning a specific monitor serial # to you.  Billing and Patient Assistance Program Information  We have supplied [employer] with any of your insurance information on file for  billing purposes. [employer] offers a sliding scale Patient Assistance Program for patients that do not have  insurance, or whose insurance does not completely cover the cost of the ZIO monitor.  You must apply for the Patient Assistance Program to qualify for this discounted rate.  To apply, please call [employer] at [phone], select option 4, select option 2, ask to apply for  Patient Assistance Program. Theodore Demark will ask your household income, and how many people  are in your household. They will quote your out-of-pocket cost based on that information.  [employer] will also be able to set up a 70-month interest-free payment plan if needed.  Applying the monitor   Shave hair from upper left chest.  Hold abrader disc by orange tab. Rub abrader in 40 strokes over the upper left chest as  indicated in your monitor instructions.  Clean area with 4 enclosed alcohol pads. Let dry.  Apply patch as indicated in monitor instructions. Patch will be placed under collarbone on left  side of chest with arrow pointing upward.  Rub patch adhesive wings for 2 minutes. Remove white label marked "1". Remove the white  label marked "2". Rub patch adhesive wings for 2 additional minutes.  While looking in a mirror, press and release button in center of patch. A small green light will  flash 3-4 times. This will be your only indicator that the monitor has been turned on.  Do not shower for the first 24 hours. You may shower after the first 24 hours.  Press the button if you feel a symptom. You will hear a small click. Record  Date, Time and  Symptom in the Patient Logbook.  When you are ready to remove the patch, follow instructions on the last 2 pages of Patient  Logbook. Stick patch monitor onto the last page of Patient Logbook.  Place Patient Logbook in the blue and white box. Use locking tab on box and tape box closed  securely. The blue and white box has prepaid postage on it. Please place it in the mailbox  as  soon as possible. Your physician should have your test results approximately 7 days after the  monitor has been mailed back to [facility].  Call [place] at [phone] if you have questions regarding  your ZIO XT patch monitor. Call them immediately if you see an orange light blinking on your  monitor.  If your monitor falls off in less than 4 days, contact our Monitor department at [phone].  If your monitor becomes loose or falls off after 4 days call [employer] at [phone] for  suggestions on securing your monitor

## 2021-01-06 NOTE — Progress Notes (Signed)
Please call patient.  I saw that he had his colonoscopy .  I would like to recheck a CBC and iron level in the next week or two.  Please see about getting a lab visit set up.  He does not need to fast.  I put in the lab orders.  Thanks.

## 2021-01-06 NOTE — Progress Notes (Signed)
Electrophysiology Office Note:    Date:  01/06/2021   ID:  Paul Fry, DOB 01-May-1946, MRN [id number]  PCP:  Paul Ghent, MD  [facility] Cardiologist:  Paul Merle, NP (Inactive)  [facility] Electrophysiologist:  None   Referring MD: Paul Pain, MD   Chief Complaint: Atrial fibrillation  History of Present Illness:    Paul Fry is a 75 y.o. male who presents for an evaluation of atrial fibrillation at the request of Dr. Marlou Fry. Their medical history includes nonischemic cardiomyopathy, coronary artery disease, GI bleeding.  The patient last saw Dr. Marlou Fry on November 18, 2020.  At that appointment he reported 2 to 3 months of fatigue.  An EKG was performed which demonstrated newly discovered atrial fibrillation.  He cannot appreciate palpitations at the time of the EKG but did note fatigue.  For his GI bleeding, has seen Dr. Fuller Plan with gastroenterology.  His last appointment with Dr. Fuller Plan was December 04, 2020.  He had a prior EGD which showed an esophageal AVM, H. Pylori.  He had an EGD and colonoscopy on January 05, 2021.  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  Polyps were found in the cecum and sigmoid colon and successfully removed.  There were diverticula noted in the descending colon, hepatic flexure, sigmoid colon.  Given his history of GI bleeding, he is being referred to discuss watchman implant in an effort to avoid long-term exposure to anticoagulation.  The patient had his daughter, Paul Fry, on the phone today who is a family physician.  Past Medical History:  Diagnosis Date   Allergy    SEASONAL   Anemia    Arthritis    KNEES   BPH (benign prostatic hyperplasia)    Diverticulosis of colon    Elevated PSA    1.22 on 01-20-2016   Feeling of incomplete bladder emptying    GERD (gastroesophageal reflux disease)    Hiatal hernia    History of adenomatous polyp of colon    2014 tubular adenoma   History of chronic gastritis    History of  Helicobacter pylori infection    07/ 2016   History of lower GI bleeding    07/ 2010 and 12/ 2011  diverticular bleed both times   Hyperlipidemia    Hypertension    Lower urinary tract symptoms (LUTS)    Nonischemic cardiomyopathy (HCC)    Plantar fascia syndrome    Strains to urinate    INTERMITTANT   Type 2 diabetes, diet controlled ([place])    ON BORDER LINE,NEVER TOOK MEDICATION.   Wears glasses     Past Surgical History:  Procedure Laterality Date   APPENDECTOMY  teenager   CARDIAC CATHETERIZATION  09-10-2008   dr dalton mclean   mild non-obstructive CAD;  30-40% mRCA, 20%pLAD,  ef 55%   CARDIOVASCULAR STRESS TEST  07/29/2011   Low risk nuclear study w/ no ischemia or infarct/  LVEF 47% and  apical hypokinesis (08-07-2012 cardiac MRI -- EF 49% w/ no hyperenhancement distal septal and apical hypokinesis, mild LAE, mild RVE, mild AR with mild dilated sinus 15m)   CATARACT EXTRACTION W/ INTRAOCULAR LENS  IMPLANT, BILATERAL  2016   COLONOSCOPY  last one 12-21-2012   CYSTOSCOPY WITH INSERTION OF UROLIFT N/A 06/17/2016   Procedure: CYSTOSCOPY WITH INSERTION OF UROLIFT;  Surgeon: SCarolan Clines MD;  [facility]  Service: Urology;  Laterality: N/A;   ESOPHAGOGASTRODUODENOSCOPY  last one 11-22-2014   TRANSTHORACIC  ECHOCARDIOGRAM  09/17/2014   ef 50-55%/  trivial AR, MR and PR/  mild LAE and RAE/  mild TR    Current Medications: Current Meds  Medication Sig   ascorbic acid (VITAMIN C) 1000 MG tablet Take 1,000 mg by mouth daily.   atorvastatin (LIPITOR) 20 MG tablet Take 1 tablet (20 mg total) by mouth every morning.   calcium carbonate (TUMS - DOSED IN MG ELEMENTAL CALCIUM) 500 MG chewable tablet Chew 1 tablet by mouth as needed for indigestion. Reported on 07/17/2015   fish oil-omega-3 fatty acids 1000 MG capsule Take 1 g by mouth daily.   losartan (COZAAR) 25 MG tablet Take 0.5 tablets (12.5 mg total) by mouth every morning.   metoprolol succinate  (TOPROL-XL) 50 MG 24 hr tablet Take 1 tablet (50 mg total) by mouth daily. Take with or immediately following a meal.   Multiple Vitamin (MULTIVITAMIN) tablet Take 1 tablet by mouth daily.   omeprazole (PRILOSEC) 20 MG capsule Take 1 capsule (20 mg total) by mouth 2 (two) times daily.   rivaroxaban (XARELTO) 20 MG TABS tablet Take 1 tablet (20 mg total) by mouth daily with supper.   tamsulosin (FLOMAX) 0.4 MG CAPS capsule Take 2 capsules (0.8 mg total) by mouth daily.   [DISCONTINUED] apixaban (ELIQUIS) 5 MG TABS tablet Take 5 mg by mouth 2 (two) times daily.   Current Facility-Administered Medications for the 01/06/21 encounter (Office Visit) with Vickie Epley, MD  Medication   0.9 %  sodium chloride infusion     Allergies:   Aspirin   Social History   Socioeconomic History   Marital status: Married    Spouse name: Not on file   Number of children: 0   Years of education: Not on file   Highest education level: Not on file  Occupational History   Occupation: truck driver- retired    Fish farm manager: RETIRED  Tobacco Use   Smoking status: Never   Smokeless tobacco: Never  Vaping Use   Vaping Use: Never used  Substance and Sexual Activity   Alcohol use: Yes    Comment: rarely drinks beer   Drug use: No   Sexual activity: Not Currently  Other Topics Concern   Not on file  Social History Narrative   Married 2003, no children.    Retired Administrator, gets regular exercise- walking.    Step daughter is MD in [place]   Social Determinants of Health   Financial Resource Strain: Low Risk    Difficulty of Paying Living Expenses: Not very hard  Food Insecurity: Not on file  Transportation Needs: Not on file  Physical Activity: Not on file  Stress: Not on file  Social Connections: Not on file     Family History: The patient's family history includes Colon cancer (age of onset: 47) in his mother; Colon polyps in his brother; Heart disease in his sister; Hypertension in his  sister and sister; Kidney disease in his sister; Lung cancer in his brother. There is no history of Diabetes, Depression, Stroke, Alcohol abuse, Prostate cancer, Liver cancer, Rectal cancer, or Stomach cancer.  ROS:   Please see the history of present illness.    All other systems reviewed and are negative.  EKGs/Labs/Other Studies Reviewed:    The following studies were reviewed today:  November 18, 2020 EKG Atrial fibrillation with a ventricular rate of 103 bpm    Sep 17, 2019 echo Left ventricular function mildly decreased, 45% Mild LVH Right ventricular function normal Mild  MR Mild AI  August 07, 2012 cardiac MRI    1)    Normal LV size with mild ventricular non-compaction  2)    EF 49% with no hyperenhancement distal septal and apical  hypokinesis  3)    Mild LAE  4)    Mild RV enlargement  5)    Mild AR with mildly dilated sinus 63m.   Cardiac catheterization in 2010 showed mild nonobstructive CAD     EKG:  The ekg ordered today demonstrates sinus rhythm with a ventricular rate of 71 bpm.  Recent Labs: 10/16/2020: ALT 19; BUN 13; Creatinine, Ser 0.95; Potassium 4.5; Sodium 139 10/23/2020: Hemoglobin 12.5; Platelets 350.0  Recent Lipid Panel    Component Value Date/Time   CHOL 129 10/16/2020 0904   TRIG 115.0 10/16/2020 0904   HDL 30.40 (L) 10/16/2020 0904   CHOLHDL 4 10/16/2020 0904   VLDL 23.0 10/16/2020 0904   LDLCALC 76 10/16/2020 0904    Physical Exam:    VS:  BP (!) 148/80   Pulse 71   Ht 6' (1.829 m)   Wt 199 lb (90.3 kg)   BMI 26.99 kg/m     Wt Readings from Last 3 Encounters:  01/06/21 199 lb (90.3 kg)  01/05/21 199 lb (90.3 kg)  12/04/20 199 lb (90.3 kg)     GEN:  Well nourished, well developed in no acute distress HEENT: Normal NECK: No JVD; No carotid bruits LYMPHATICS: No lymphadenopathy CARDIAC: RRR, no murmurs, rubs, gallops RESPIRATORY:  Clear to auscultation without rales, wheezing or rhonchi  ABDOMEN: Soft, non-tender,  non-distended MUSCULOSKELETAL:  No edema; No deformity  SKIN: Warm and dry NEUROLOGIC:  Alert and oriented x 3 PSYCHIATRIC:  Normal affect   ASSESSMENT:    1. Atrial fibrillation, unspecified type ([place]   2. Gastrointestinal hemorrhage, unspecified gastrointestinal hemorrhage type   3. NICM (nonischemic cardiomyopathy) ([place]   4. Essential hypertension   5. CAD in native artery    PLAN:    In order of problems listed above:  1. Atrial fibrillation, paroxysmal (HCC) Unclear burden of atrial fibrillation.  I suspect his atrial fibrillation has been contributing to his fatigue during the last few months.  Before making a final treatment decision, I would like to get a better idea of the burden of atrial fibrillation using a heart monitor.  He has a history of a mildly reduced left ventricular function, EF 45%.  A rhythm control strategy is indicated.  We discussed stroke prophylaxis strategies related to his atrial fibrillation given his history of GI bleeding and EGD findings of an AVM in the esophagus and diverticular disease within the colon.  He has had 2 hospitalizations for GI bleeding.  He would like to avoid long-term exposure to anticoagulation which I think is very reasonable given his history.  We discussed the watchman procedure in detail including the risks, recovery and need for short-term anticoagulation.  His daughter was involved with these conversations.  They would like to proceed with scheduling a watchman implant.  Given the need to quantify his atrial fibrillation burden we will start with the ZIO monitor and I will plan to see him back in clinic in about 6 weeks.  If there is atrial fibrillation noted on the ZIO monitor, would plan to perform a pulmonary vein isolation procedure follow-up 6 to 8 weeks later with a watchman implant.  There is no atrial fibrillation noted on the ZIO monitor, we will plan to proceed directly to watchman implant.  He will need to take  short-term anticoagulation around the time of the watchman implant.  We will plan to use Eliquis 5 mg by mouth twice daily.  I will finalize the timing of when to start this medication at her next appointment.  ----------   2. Gastrointestinal hemorrhage, unspecified gastrointestinal hemorrhage type See #1  3. NICM (nonischemic cardiomyopathy) ([place]) Rhythm control strategy is indicated as in #1.  NYHA class II-III today.  Warm and dry on exam.  Continue metoprolol, losartan.  We will need to update his echocardiogram prior to any procedure.  4. Essential hypertension Slightly above goal today.  Continue current medical therapy.  Recommend checking blood pressures at home 1-2 times per week.  5. CAD in native artery No ischemic symptoms. Heart cath in 2010 showed no significant obstructive disease       Total time spent with patient today 65 minutes. This includes reviewing records, evaluating the patient and coordinating care.  Medication Adjustments/Labs and Tests Ordered: Current medicines are reviewed at length with the patient today.  Concerns regarding medicines are outlined above.  Orders Placed This Encounter  Procedures   LONG TERM MONITOR (3-14 DAYS)   EKG 12-Lead    No orders of the defined types were placed in this encounter.    Signed, Hilton Cork. Quentin Ore, MD, [facility], [facility] 01/06/2021 1:16 PM    Electrophysiology [facility]

## 2021-01-07 ENCOUNTER — Telehealth: Payer: Self-pay | Admitting: *Deleted

## 2021-01-07 ENCOUNTER — Ambulatory Visit: Payer: Medicare Other | Admitting: Physician Assistant

## 2021-01-07 ENCOUNTER — Other Ambulatory Visit: Payer: Self-pay

## 2021-01-07 NOTE — Telephone Encounter (Signed)
  Follow up Call-  Call back number 01/05/2021  Post procedure Call Back phone  # [phone]  Permission to leave phone message Yes  Some recent data might be hidden     Patient questions:    Do you have a fever, pain , or abdominal swelling? No. Pain Score  0 *  Have you tolerated food without any problems? Yes.    Have you been able to return to your normal activities? Yes.    Do you have any questions about your discharge instructions: Diet   No. Medications  No. Follow up visit  No.  Do you have questions or concerns about your Care? No.  Actions: * If pain score is 4 or above: No action needed, pain <4.  Have you developed a fever since your procedure? no  2.   Have you had an respiratory symptoms (SOB or cough) since your procedure? no  3.   Have you tested positive for COVID 19 since your procedure no  4.   Have you had any family members/close contacts diagnosed with the COVID 19 since your procedure?  no   If yes to any of these questions please route to Joylene John, RN and Joella Prince, RN

## 2021-01-08 DIAGNOSIS — I428 Other cardiomyopathies: Secondary | ICD-10-CM | POA: Diagnosis not present

## 2021-01-08 DIAGNOSIS — I10 Essential (primary) hypertension: Secondary | ICD-10-CM | POA: Diagnosis not present

## 2021-01-08 DIAGNOSIS — K922 Gastrointestinal hemorrhage, unspecified: Secondary | ICD-10-CM

## 2021-01-08 DIAGNOSIS — I4891 Unspecified atrial fibrillation: Secondary | ICD-10-CM

## 2021-01-08 DIAGNOSIS — I2510 Atherosclerotic heart disease of native coronary artery without angina pectoris: Secondary | ICD-10-CM

## 2021-01-13 ENCOUNTER — Other Ambulatory Visit: Payer: Self-pay

## 2021-01-13 ENCOUNTER — Other Ambulatory Visit (INDEPENDENT_AMBULATORY_CARE_PROVIDER_SITE_OTHER): Payer: Medicare Other

## 2021-01-13 DIAGNOSIS — D649 Anemia, unspecified: Secondary | ICD-10-CM | POA: Diagnosis not present

## 2021-01-13 LAB — IRON: Iron: 81 ug/dL (ref 42–165)

## 2021-01-13 LAB — CBC WITH DIFFERENTIAL/PLATELET
Basophils Absolute: 0.1 10*3/uL (ref 0.0–0.1)
Basophils Relative: 1.2 % (ref 0.0–3.0)
Eosinophils Absolute: 0 10*3/uL (ref 0.0–0.7)
Eosinophils Relative: 0.9 % (ref 0.0–5.0)
HCT: 37 % — ABNORMAL LOW (ref 39.0–52.0)
Hemoglobin: 11.9 g/dL — ABNORMAL LOW (ref 13.0–17.0)
Lymphocytes Relative: 35.9 % (ref 12.0–46.0)
Lymphs Abs: 1.9 10*3/uL (ref 0.7–4.0)
MCHC: 32.3 g/dL (ref 30.0–36.0)
MCV: 83.2 fl (ref 78.0–100.0)
Monocytes Absolute: 0.3 10*3/uL (ref 0.1–1.0)
Monocytes Relative: 6.5 % (ref 3.0–12.0)
Neutro Abs: 2.9 10*3/uL (ref 1.4–7.7)
Neutrophils Relative %: 55.5 % (ref 43.0–77.0)
Platelets: 320 10*3/uL (ref 150.0–400.0)
RBC: 4.45 Mil/uL (ref 4.22–5.81)
RDW: 21.5 % — ABNORMAL HIGH (ref 11.5–15.5)
WBC: 5.3 10*3/uL (ref 4.0–10.5)

## 2021-01-14 ENCOUNTER — Other Ambulatory Visit: Payer: Self-pay | Admitting: Family Medicine

## 2021-01-14 DIAGNOSIS — D649 Anemia, unspecified: Secondary | ICD-10-CM

## 2021-01-14 DIAGNOSIS — E119 Type 2 diabetes mellitus without complications: Secondary | ICD-10-CM

## 2021-01-14 MED ORDER — IRON (FERROUS SULFATE) 325 (65 FE) MG PO TABS: 325.0000 mg | ORAL_TABLET | Freq: Every day | ORAL | Status: DC

## 2021-01-16 ENCOUNTER — Other Ambulatory Visit: Payer: Self-pay

## 2021-01-16 DIAGNOSIS — A048 Other specified bacterial intestinal infections: Secondary | ICD-10-CM

## 2021-01-16 MED ORDER — METRONIDAZOLE 250 MG PO TABS: 250.0000 mg | ORAL_TABLET | Freq: Four times a day (QID) | ORAL | 0 refills | Status: AC

## 2021-01-16 MED ORDER — DOXYCYCLINE HYCLATE 100 MG PO CAPS: 100.0000 mg | ORAL_CAPSULE | Freq: Two times a day (BID) | ORAL | 0 refills | Status: AC

## 2021-01-16 MED ORDER — BISMUTH SUBSALICYLATE 262 MG PO CHEW: 524.0000 mg | CHEWABLE_TABLET | Freq: Four times a day (QID) | ORAL | 0 refills | Status: AC

## 2021-01-21 ENCOUNTER — Other Ambulatory Visit: Payer: Self-pay

## 2021-01-21 MED ORDER — METOPROLOL SUCCINATE ER 50 MG PO TB24: 50.0000 mg | ORAL_TABLET | Freq: Every day | ORAL | 3 refills | Status: DC

## 2021-01-23 DIAGNOSIS — I4891 Unspecified atrial fibrillation: Secondary | ICD-10-CM | POA: Diagnosis not present

## 2021-01-23 DIAGNOSIS — I428 Other cardiomyopathies: Secondary | ICD-10-CM | POA: Diagnosis not present

## 2021-01-23 DIAGNOSIS — I2510 Atherosclerotic heart disease of native coronary artery without angina pectoris: Secondary | ICD-10-CM | POA: Diagnosis not present

## 2021-01-23 DIAGNOSIS — I10 Essential (primary) hypertension: Secondary | ICD-10-CM | POA: Diagnosis not present

## 2021-01-26 ENCOUNTER — Telehealth: Payer: Self-pay | Admitting: Gastroenterology

## 2021-01-26 NOTE — Telephone Encounter (Signed)
Patient states 3-4 days after starting H. Pylori treatment he started having nausea and headaches. Patient states he only noticed it after he takes the Flagyl specifically. Clarified with patient that he is not drinking alcohol or taking any products with alcohol such as Listerine. Patient states he is using a mouth wash with alcohol. Informed patient to stop the mouth wash for several days and see if this improves his symptoms. Informed patient if it does not improve his symptoms or his symptoms get worse then to stop medication completely and contact our office. Patient verbalized understanding.

## 2021-02-12 DIAGNOSIS — Z23 Encounter for immunization: Secondary | ICD-10-CM | POA: Diagnosis not present

## 2021-02-20 ENCOUNTER — Telehealth: Payer: Self-pay | Admitting: Family Medicine

## 2021-02-20 NOTE — Telephone Encounter (Signed)
Spoke with patient and advised on below. Patient is going to follow advise and will call back if does not help or tolerate lower dose.

## 2021-02-20 NOTE — Telephone Encounter (Signed)
Would stop iron for a few days to see if sx resolve.  If sx resolve off iron, then try restarting every other day or MWF.  See if he can tolerate that.  Update [place] if not better or if not tolerated at all.  Thanks.

## 2021-02-20 NOTE — Telephone Encounter (Signed)
Pt returning call

## 2021-02-20 NOTE — Telephone Encounter (Signed)
Pt requesting call from [place]. States he is experiencing constipation from taking his iron pills and wants to know if there is anything he can do to help. Please advise.

## 2021-02-22 NOTE — Progress Notes (Signed)
Electrophysiology Office Follow up Visit Note:    Date:  02/23/2021   ID:  Paul Fry, DOB August 06, 1945, MRN [id number]  PCP:  [name], MD  [facility] Cardiologist:  Truitt Merle, NP (Inactive)  [facility] Electrophysiologist:  Vickie Epley, MD    Interval History:    Paul Fry is a 75 y.o. male who presents for a follow up visit. They were last seen in clinic January 06, 2021.  That visit was in the setting of a recent diagnosis of atrial fibrillation by Dr. [name].  He also has a mildly reduced left ventricular function with an ejection fraction of 45%.  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  We ordered a ZIO monitor at our last appointment to help better understand the burden of atrial fibrillation.  Today he presents for follow-up.  His daughter, Paul Fry, is on the phone during our visit.  We discussed the results of his monitor today.  He continues to feel intermittent shortness of breath and fatigue.     Past Medical History:  Diagnosis Date   Allergy    SEASONAL   Anemia    Arthritis    KNEES   BPH (benign prostatic hyperplasia)    Diverticulosis of colon    Elevated PSA    1.22 on 01-20-2016   Feeling of incomplete bladder emptying    GERD (gastroesophageal reflux disease)    Hiatal hernia    History of adenomatous polyp of colon    2014 tubular adenoma   History of chronic gastritis    History of Helicobacter pylori infection    07/ 2016   History of lower GI bleeding    07/ 2010 and 12/ 2011  diverticular bleed both times   Hyperlipidemia    Hypertension    Lower urinary tract symptoms (LUTS)    Nonischemic cardiomyopathy (HCC)    Plantar fascia syndrome    Strains to urinate    INTERMITTANT   Type 2 diabetes, diet controlled ([place])    ON BORDER LINE,NEVER TOOK MEDICATION.   Wears glasses     Past Surgical History:  Procedure Laterality Date   APPENDECTOMY  teenager   CARDIAC CATHETERIZATION  09-10-2008    dr dalton mclean   mild non-obstructive CAD;  30-40% mRCA, 20%pLAD,  ef 55%   CARDIOVASCULAR STRESS TEST  07/29/2011   Low risk nuclear study w/ no ischemia or infarct/  LVEF 47% and  apical hypokinesis (08-07-2012 cardiac MRI -- EF 49% w/ no hyperenhancement distal septal and apical hypokinesis, mild LAE, mild RVE, mild AR with mild dilated sinus 33mm)   CATARACT EXTRACTION W/ INTRAOCULAR LENS  IMPLANT, BILATERAL  2016   COLONOSCOPY  last one 12-21-2012   CYSTOSCOPY WITH INSERTION OF UROLIFT N/A 06/17/2016   Procedure: CYSTOSCOPY WITH INSERTION OF UROLIFT;  Surgeon: [name], MD;  Location: [facility];  Service: Urology;  Laterality: N/A;   ESOPHAGOGASTRODUODENOSCOPY  last one 11-22-2014   TRANSTHORACIC ECHOCARDIOGRAM  09/17/2014   ef 50-55%/  trivial AR, MR and PR/  mild LAE and RAE/  mild TR    Current Medications: No outpatient medications have been marked as taking for the 02/23/21 encounter (Office Visit) with Vickie Epley, MD.   Current Facility-Administered Medications for the 02/23/21 encounter (Office Visit) with Vickie Epley, MD  Medication   0.9 %  sodium chloride infusion     Allergies:   Aspirin   Social History  Socioeconomic History   Marital status: Married    Spouse name: Not on file   Number of children: 0   Years of education: Not on file   Highest education level: Not on file  Occupational History   Occupation: truck driver- retired    Fish farm manager: RETIRED  Tobacco Use   Smoking status: Never   Smokeless tobacco: Never  Vaping Use   Vaping Use: Never used  Substance and Sexual Activity   Alcohol use: Yes    Comment: rarely drinks beer   Drug use: No   Sexual activity: Not Currently  Other Topics Concern   Not on file  Social History Narrative   Married 2003, no children.    Retired Administrator, gets regular exercise- walking.    Step daughter is MD in [place]   Social Determinants of Health   Financial  Resource Strain: Low Risk    Difficulty of Paying Living Expenses: Not very hard  Food Insecurity: Not on file  Transportation Needs: Not on file  Physical Activity: Not on file  Stress: Not on file  Social Connections: Not on file     Family History: The patient's family history includes Colon cancer (age of onset: 17) in his mother; Colon polyps in his brother; Heart disease in his sister; Hypertension in his sister and sister; Kidney disease in his sister; Lung cancer in his brother. There is no history of Diabetes, Depression, Stroke, Alcohol abuse, Prostate cancer, Liver cancer, Rectal cancer, or Stomach cancer.  ROS:   Please see the history of present illness.    All other systems reviewed and are negative.  EKGs/Labs/Other Studies Reviewed:    The following studies were reviewed today:  January 26, 2021 ZIO monitor personally reviewed HR 33 - 191bpm, average 62bpm. 1% burden of AF/AFL with longest episode lasting 28 minutes. Day to day variation in AF burden (<1% - 4%) Occasional supraventricular ectopy, 3.2%. Rare ventricular ectopy, < 1%.    EKG:  The ekg ordered today demonstrates sinus rhythm.  Recent Labs: 10/16/2020: ALT 19; BUN 13; Creatinine, Ser 0.95; Potassium 4.5; Sodium 139 01/13/2021: Hemoglobin 11.9; Platelets 320.0  Recent Lipid Panel    Component Value Date/Time   CHOL 129 10/16/2020 0904   TRIG 115.0 10/16/2020 0904   HDL 30.40 (L) 10/16/2020 0904   CHOLHDL 4 10/16/2020 0904   VLDL 23.0 10/16/2020 0904   LDLCALC 76 10/16/2020 0904    Physical Exam:    VS:  BP 124/70   Pulse (!) 57   Ht 6' (1.829 m)   Wt 203 lb (92.1 kg)   BMI 27.53 kg/m     Wt Readings from Last 3 Encounters:  02/23/21 203 lb (92.1 kg)  01/06/21 199 lb (90.3 kg)  01/05/21 199 lb (90.3 kg)     GEN:  Well nourished, well developed in no acute distress HEENT: Normal NECK: No JVD; No carotid bruits LYMPHATICS: No lymphadenopathy CARDIAC: RRR, no murmurs, rubs,  gallops RESPIRATORY:  Clear to auscultation without rales, wheezing or rhonchi  ABDOMEN: Soft, non-tender, non-distended MUSCULOSKELETAL:  No edema; No deformity  SKIN: Warm and dry NEUROLOGIC:  Alert and oriented x 3 PSYCHIATRIC:  Normal affect        ASSESSMENT:    1. Paroxysmal atrial fibrillation (HCC)   2. Typical atrial flutter ([place])   3. Primary hypertension   4. Gastrointestinal hemorrhage, unspecified gastrointestinal hemorrhage type    PLAN:    In order of problems listed above:  1. Paroxysmal atrial  fibrillation ([place]) Daily burdens range from less than 1% to 4% on recent Holter monitor.  Given he still paroxysmal, would favor addressing the A. fib around the same time as left atrial appendage closure.  I discussed the ablation procedure in detail with the patient including the risk, recovery and efficacy.  He wishes to proceed.  6 to 8 weeks after ablation, would proceed with watchman implant.  He will continue Xarelto uninterrupted until at least 45 days after the watchman implant.   Risk, benefits, and alternatives to EP study and radiofrequency ablation for afib were also discussed in detail today. These risks include but are not limited to stroke, bleeding, vascular damage, tamponade, perforation, damage to the esophagus, lungs, and other structures, pulmonary vein stenosis, worsening renal function, and death. The patient understands these risk and wishes to proceed.  We will therefore proceed with catheter ablation at the next available time.  Carto, ICE, anesthesia are requested for the procedure.  Will also obtain CT PV protocol prior to the procedure to exclude LAA thrombus and further evaluate atrial anatomy.   He will also need an echocardiogram prior to the procedure.   Ablation strategy will be PVI plus CTI ablation.  ----------   I have seen Paul Fry in the office today who is being considered for a Watchman left atrial appendage closure device.  I believe they will benefit from this procedure given their history of atrial fibrillation, CHA2DS2-VASc score of 3 and unadjusted ischemic stroke rate of 3.2% per year. Unfortunately, the patient is not felt to be a long term anticoagulation candidate secondary to history of GI bleeding. The patient's chart has been reviewed and I feel that they would be a candidate for short term oral anticoagulation after Watchman implant.   It is my belief that after undergoing a LAA closure procedure, Paul Fry will not need long term anticoagulation which eliminates anticoagulation side effects and major bleeding risk.   Procedural risks for the Watchman implant have been reviewed with the patient including a 0.5% risk of stroke, <1% risk of perforation and <1% risk of device embolization.    The published clinical data on the safety and effectiveness of WATCHMAN include but are not limited to the following: - Holmes DR, Mechele Claude, Sick P et al. for the PROTECT AF Investigators. Percutaneous closure of the left atrial appendage versus warfarin therapy for prevention of stroke in patients with atrial fibrillation: a randomised non-inferiority trial. Lancet 2009; 374: 534-42. Mechele Claude, Doshi SK, Abelardo Diesel D et al. on behalf of the PROTECT AF Investigators. Percutaneous Left Atrial Appendage Closure for Stroke Prophylaxis in Patients With Atrial Fibrillation 2.3-Year Follow-up of the PROTECT AF (Watchman Left Atrial Appendage System for Embolic Protection in Patients With Atrial Fibrillation) Trial. Circulation 2013; 127:720-729. - Alli O, Doshi S,  Kar S, Reddy VY, Sievert H et al. Quality of Life Assessment in the Randomized PROTECT AF (Percutaneous Closure of the Left Atrial Appendage Versus Warfarin Therapy for Prevention of Stroke in Patients With Atrial Fibrillation) Trial of Patients at Risk for Stroke With Nonvalvular Atrial Fibrillation. J Am Coll Cardiol 2013; 42:7062-3. Vertell Limber DR, Tarri Abernethy,  Stanford M, [place], Sievert H, Doshi S, Huber K, Reddy V. Prospective randomized evaluation of the Watchman left atrial appendage Device in patients with atrial fibrillation versus long-term warfarin therapy; the PREVAIL trial. Journal of the [employer] of Cardiology, Vol. 4, No. 1, 2014, 1-11. - Kar S, Doshi SK, Sadhu A,  Horton R, Osorio J et al. Primary outcome evaluation of a next-generation left atrial appendage closure device: results from the PINNACLE FLX trial. Circulation 2021;143(18)1754-1762.    After today's visit with the patient which was dedicated solely for shared decision making visit regarding LAA closure device, the patient decided to proceed with the LAA appendage closure procedure scheduled to be done in the near future at [facility]. Prior to the procedure, I would like to obtain a gated CT scan of the chest with contrast timed for PV/LA visualization.     2. Typical atrial flutter ([place]) See #1  3. Primary hypertension Controlled.  Continue current regimen  4. Gastrointestinal hemorrhage, unspecified gastrointestinal hemorrhage type Watchman as an #1     Total time spent with patient today 42 minutes. This includes reviewing records, evaluating the patient and coordinating care.   Medication Adjustments/Labs and Tests Ordered: Current medicines are reviewed at length with the patient today.  Concerns regarding medicines are outlined above.  No orders of the defined types were placed in this encounter.  No orders of the defined types were placed in this encounter.    Signed, Lars Mage, MD, [facility], [facility] 02/23/2021 8:46 AM    Electrophysiology [facility]

## 2021-02-23 ENCOUNTER — Other Ambulatory Visit: Payer: Self-pay

## 2021-02-23 ENCOUNTER — Ambulatory Visit (INDEPENDENT_AMBULATORY_CARE_PROVIDER_SITE_OTHER): Payer: Medicare Other | Admitting: Cardiology

## 2021-02-23 VITALS — BP 124/70 | HR 57 | Ht 72.0 in | Wt 203.0 lb

## 2021-02-23 DIAGNOSIS — I10 Essential (primary) hypertension: Secondary | ICD-10-CM | POA: Diagnosis not present

## 2021-02-23 DIAGNOSIS — K922 Gastrointestinal hemorrhage, unspecified: Secondary | ICD-10-CM | POA: Diagnosis not present

## 2021-02-23 DIAGNOSIS — I4891 Unspecified atrial fibrillation: Secondary | ICD-10-CM | POA: Diagnosis not present

## 2021-02-23 DIAGNOSIS — I480 Paroxysmal atrial fibrillation: Secondary | ICD-10-CM | POA: Diagnosis not present

## 2021-02-23 DIAGNOSIS — I483 Typical atrial flutter: Secondary | ICD-10-CM | POA: Diagnosis not present

## 2021-02-23 NOTE — Patient Instructions (Addendum)
Medication Instructions:  Your physician recommends that you continue on your current medications as directed. Please refer to the Current Medication list given to you today. *If you need a refill on your cardiac medications before your next appointment, please call your pharmacy*  Lab Work: None ordered. If you have labs (blood work) drawn today and your tests are completely normal, you will receive your results only by: [place] (if you have MyChart) OR A paper copy in the mail If you have any lab test that is abnormal or we need to change your treatment, we will call you to review the results.  Testing/Procedures: Your physician has requested that you have an echocardiogram. Echocardiography is a painless test that uses sound waves to create images of your heart. It provides your doctor with information about the size and shape of your heart and how well your heart's chambers and valves are working. This procedure takes approximately one hour. There are no restrictions for this procedure.  Please schedule for ECHO  Follow-Up: At [facility], you and your health needs are our priority.  As part of our continuing mission to provide you with exceptional heart care, we have created designated Provider Care Teams.  These Care Teams include your primary Cardiologist (physician) and Advanced Practice Providers (APPs -  Physician Assistants and Nurse Practitioners) who all work together to provide you with the care you need, when you need it.  Your next appointment:    SEE INSTRUCTION LETTER  Cardiac Ablation Cardiac ablation is a procedure to destroy, or ablate, a small amount of heart tissue in very specific places. The heart has many electrical connections. Sometimes these connections are abnormal and can cause the heart to beat very fast or irregularly. Ablating some of the areas that cause problems can improve the heart's rhythm or return it to normal. Ablation may be done for  people who: Have Wolff-Parkinson-White syndrome. Have fast heart rhythms (tachycardia). Have taken medicines for an abnormal heart rhythm (arrhythmia) that were not effective or caused side effects. Have a high-risk heartbeat that may be life-threatening. During the procedure, a small incision is made in the neck or the groin, and a long, thin tube (catheter) is inserted into the incision and moved to the heart. Small devices (electrodes) on the tip of the catheter will send out electrical currents. A type of X-ray (fluoroscopy) will be used to help guide the catheter and to provide images of the heart. Tell a health care provider about: Any allergies you have. All medicines you are taking, including vitamins, herbs, eye drops, creams, and over-the-counter medicines. Any problems you or family members have had with anesthetic medicines. Any blood disorders you have. Any surgeries you have had. Any medical conditions you have, such as kidney failure. Whether you are pregnant or may be pregnant. What are the risks? Generally, this is a safe procedure. However, problems may occur, including: Infection. Bruising and bleeding at the catheter insertion site. Bleeding into the chest, especially into the sac that surrounds the heart. This is a serious complication. Stroke or blood clots. Damage to nearby structures or organs. Allergic reaction to medicines or dyes. Need for a permanent pacemaker if the normal electrical system is damaged. A pacemaker is a small computer that sends electrical signals to the heart and helps your heart beat normally. The procedure not being fully effective. This may not be recognized until months later. Repeat ablation procedures are sometimes done. What happens before the procedure? Medicines Ask your  health care provider about: Changing or stopping your regular medicines. This is especially important if you are taking diabetes medicines or blood thinners. Taking  medicines such as aspirin and ibuprofen. These medicines can thin your blood. Do not take these medicines unless your health care provider tells you to take them. Taking over-the-counter medicines, vitamins, herbs, and supplements. General instructions Follow instructions from your health care provider about eating or drinking restrictions. Plan to have someone take you home from the hospital or clinic. If you will be going home right after the procedure, plan to have someone with you for 24 hours. Ask your health care provider what steps will be taken to prevent infection. What happens during the procedure?  An IV will be inserted into one of your veins. You will be given a medicine to help you relax (sedative). The skin on your neck or groin will be numbed. An incision will be made in your neck or your groin. A needle will be inserted through the incision and into a large vein in your neck or groin. A catheter will be inserted into the needle and moved to your heart. Dye may be injected through the catheter to help your surgeon see the area of the heart that needs treatment. Electrical currents will be sent from the catheter to ablate heart tissue in desired areas. There are three types of energy that may be used to do this: Heat (radiofrequency energy). Laser energy. Extreme cold (cryoablation). When the tissue has been ablated, the catheter will be removed. Pressure will be held on the insertion area to prevent a lot of bleeding. A bandage (dressing) will be placed over the insertion area. The exact procedure may vary among health care providers and hospitals. What happens after the procedure? Your blood pressure, heart rate, breathing rate, and blood oxygen level will be monitored until you leave the hospital or clinic. Your insertion area will be monitored for bleeding. You will need to lie still for a few hours to ensure that you do not bleed from the insertion area. Do not drive  for 24 hours or as long as told by your health care provider. Summary Cardiac ablation is a procedure to destroy, or ablate, a small amount of heart tissue using an electrical current. This procedure can improve the heart rhythm or return it to normal. Tell your health care provider about any medical conditions you may have and all medicines you are taking to treat them. This is a safe procedure, but problems may occur. Problems may include infection, bruising, damage to nearby organs or structures, or allergic reactions to medicines. Follow your health care provider's instructions about eating and drinking before the procedure. You may also be told to change or stop some of your medicines. After the procedure, do not drive for 24 hours or as long as told by your health care provider. This information is not intended to replace advice given to you by your health care provider. Make sure you discuss any questions you have with your health care provider. Document Revised: 03/05/2019 Document Reviewed: 03/05/2019 Elsevier Patient Education  [place].

## 2021-02-25 ENCOUNTER — Ambulatory Visit (HOSPITAL_COMMUNITY): Payer: Medicare Other | Attending: Cardiology

## 2021-02-25 ENCOUNTER — Other Ambulatory Visit: Payer: Self-pay

## 2021-02-25 DIAGNOSIS — I480 Paroxysmal atrial fibrillation: Secondary | ICD-10-CM | POA: Diagnosis not present

## 2021-02-25 DIAGNOSIS — I10 Essential (primary) hypertension: Secondary | ICD-10-CM | POA: Diagnosis not present

## 2021-02-25 DIAGNOSIS — I483 Typical atrial flutter: Secondary | ICD-10-CM | POA: Diagnosis not present

## 2021-02-25 DIAGNOSIS — K922 Gastrointestinal hemorrhage, unspecified: Secondary | ICD-10-CM | POA: Diagnosis not present

## 2021-02-25 DIAGNOSIS — I4891 Unspecified atrial fibrillation: Secondary | ICD-10-CM | POA: Insufficient documentation

## 2021-02-25 LAB — ECHOCARDIOGRAM COMPLETE
Area-P 1/2: 3.34 cm2
S' Lateral: 3.6 cm

## 2021-02-25 MED ORDER — PERFLUTREN LIPID MICROSPHERE
1.0000 mL | INTRAVENOUS | Status: AC | PRN
  Administered 2021-02-25: 1 mL via INTRAVENOUS

## 2021-03-10 ENCOUNTER — Other Ambulatory Visit: Payer: Medicare Other

## 2021-03-10 ENCOUNTER — Other Ambulatory Visit: Payer: Self-pay

## 2021-03-10 DIAGNOSIS — A048 Other specified bacterial intestinal infections: Secondary | ICD-10-CM

## 2021-03-16 ENCOUNTER — Other Ambulatory Visit: Payer: Medicare Other

## 2021-03-16 DIAGNOSIS — A048 Other specified bacterial intestinal infections: Secondary | ICD-10-CM | POA: Diagnosis not present

## 2021-03-17 LAB — HELICOBACTER PYLORI  SPECIAL ANTIGEN
MICRO NUMBER:: 12602780
RESULT:: DETECTED — AB
SPECIMEN QUALITY: ADEQUATE

## 2021-03-19 ENCOUNTER — Other Ambulatory Visit: Payer: Self-pay

## 2021-03-19 DIAGNOSIS — A048 Other specified bacterial intestinal infections: Secondary | ICD-10-CM

## 2021-03-27 ENCOUNTER — Encounter: Payer: Self-pay | Admitting: Internal Medicine

## 2021-03-27 ENCOUNTER — Other Ambulatory Visit: Payer: Self-pay

## 2021-03-27 ENCOUNTER — Ambulatory Visit (INDEPENDENT_AMBULATORY_CARE_PROVIDER_SITE_OTHER): Payer: Medicare Other | Admitting: Internal Medicine

## 2021-03-27 DIAGNOSIS — A048 Other specified bacterial intestinal infections: Secondary | ICD-10-CM | POA: Insufficient documentation

## 2021-03-27 MED ORDER — AMOXICILLIN 500 MG PO CAPS: 1000.0000 mg | ORAL_CAPSULE | Freq: Three times a day (TID) | ORAL | 0 refills | Status: AC

## 2021-03-27 NOTE — Assessment & Plan Note (Signed)
He has H pylori that did not respond to Bismuth quadruple therapy recently.  Will attempt to treat with Amoxicillin 1gm TID and high dose PPI with omeprazole 20mg  BID x 14 days.  Advised patient to have eradication testing done 4 weeks after completing therapy.  Also advised to hold PPI for 2 weeks leading up to eradication testing and to not eat or drink anything for at least an hour prior to urea breath test. He should also hold his iron while taking the H pylori regimen and can resume this after completion.  RTC 6-8 weeks.

## 2021-03-27 NOTE — Patient Instructions (Signed)
Thank you for coming to see me today. It was a pleasure seeing you.  To Do: START amoxicillin 1000mg  three times per day for 14 days START omeprazole 20mg  twice per day for 14 days This is to treat your H pylori infection After this 14 day course you can resume your Omemprazole 20mg  at once per day Hold your iron during the 14 day course, then okay to resume afterwards Come back for H pylori eradication testing in 6-8 weeks (at least 4 weeks after you complete 14 day treatment) Stop taking your omeprazole for 2 weeks prior to eradication testing Do not eat or drink anything for at least 1-2 hours prior to coming back for eradication test  If you have any questions or concerns, please do not hesitate to call the office at [phone].  Take Care,   Jule Ser, DO

## 2021-03-27 NOTE — Progress Notes (Signed)
[facility] for [facility]ious Disease  Reason for Consult: H pylori  Referring Provider: Dr Fuller Plan  Chief Complaint  Patient presents with   New Patient (Initial Visit)    H Pylori    HPI:    Paul Fry is a 75 y.o. male with PMHx as below who presents to the clinic for H pylori.   Seen back in 2018 by GI.  EGD showed small hiatal hernia, gastritis, and an esophageal AVM.  Biopsy showed H pylori gastritis.  Treated with PPI and Pylera x 14 days.  Does not appear that eradication testing was completed.  Recently seen by GI again in July 2022 for recurrent GERD and underwent EGD.  He was noted to have a small AVM and gastritis.  Biopsy confirmed H pylori again.  He was treated with Pylera and PPI x 14 days.  Stool antigen was repeated earlier this month to confirm eradication and he remained positive.  Presents today for this issue.  He is overall feeling well.  No significant GERD or gnawing abdominal pain right now.  He has some indigestion with oatmeal in the morning.  He thinks iron has him constipated and occasionally has some stool leakage.    Prior regimens: Tetracycline, Flagyl, Bismuth x 14 days (2018) Bismuth, Flagyl, Doxy, PPI x 14 days (2022)  Patient's Medications  New Prescriptions   AMOXICILLIN (AMOXIL) 500 MG CAPSULE    Take 2 capsules (1,000 mg total) by mouth 3 (three) times daily for 14 days.  Previous Medications   ASCORBIC ACID (VITAMIN C) 1000 MG TABLET    Take 1,000 mg by mouth daily.   ATORVASTATIN (LIPITOR) 20 MG TABLET    Take 1 tablet (20 mg total) by mouth every morning.   CALCIUM CARBONATE (TUMS - DOSED IN MG ELEMENTAL CALCIUM) 500 MG CHEWABLE TABLET    Chew 1 tablet by mouth as needed for indigestion. Reported on 07/17/2015   FISH OIL-OMEGA-3 FATTY ACIDS 1000 MG CAPSULE    Take 1 g by mouth daily.   IRON, FERROUS SULFATE, 325 (65 FE) MG TABS    Take 325 mg by mouth daily.   LOSARTAN (COZAAR) 25 MG TABLET    Take 0.5 tablets (12.5 mg total) by  mouth every morning.   METOPROLOL SUCCINATE (TOPROL-XL) 50 MG 24 HR TABLET    Take 1 tablet (50 mg total) by mouth daily. Take with or immediately following a meal.   MULTIPLE VITAMIN (MULTIVITAMIN) TABLET    Take 1 tablet by mouth daily.   OMEPRAZOLE (PRILOSEC) 20 MG CAPSULE    Take 1 capsule (20 mg total) by mouth 2 (two) times daily.   RIVAROXABAN (XARELTO) 20 MG TABS TABLET    Take 1 tablet (20 mg total) by mouth daily with supper.   TAMSULOSIN (FLOMAX) 0.4 MG CAPS CAPSULE    Take 2 capsules (0.8 mg total) by mouth daily.  Modified Medications   No medications on file  Discontinued Medications   No medications on file      Past Medical History:  Diagnosis Date   Allergy    SEASONAL   Anemia    Arthritis    KNEES   BPH (benign prostatic hyperplasia)    Diverticulosis of colon    Elevated PSA    1.22 on 01-20-2016   Feeling of incomplete bladder emptying    GERD (gastroesophageal reflux disease)    Hiatal hernia    History of adenomatous polyp of colon  2014 tubular adenoma   History of chronic gastritis    History of Helicobacter pylori infection    07/ 2016   History of lower GI bleeding    07/ 2010 and 12/ 2011  diverticular bleed both times   Hyperlipidemia    Hypertension    Lower urinary tract symptoms (LUTS)    Nonischemic cardiomyopathy (HCC)    Plantar fascia syndrome    Strains to urinate    INTERMITTANT   Type 2 diabetes, diet controlled ([place])    ON BORDER LINE,NEVER TOOK MEDICATION.   Wears glasses     Social History   Tobacco Use   Smoking status: Never   Smokeless tobacco: Never  Vaping Use   Vaping Use: Never used  Substance Use Topics   Alcohol use: Not Currently    Comment: rarely drinks beer   Drug use: No    Family History  Problem Relation Age of Onset   Colon cancer Mother 52   Heart disease Sister        MI, pace maker   Kidney disease Sister        renal failure, nephrectomy- not cancer   Hypertension Sister    Hypertension  Sister    Colon polyps Brother    Lung cancer Brother    Diabetes Neg Hx        DM   Depression Neg Hx    Stroke Neg Hx    Alcohol abuse Neg Hx        also no drug abuse    Prostate cancer Neg Hx    Liver cancer Neg Hx    Rectal cancer Neg Hx    Stomach cancer Neg Hx     Allergies  Allergen Reactions   Aspirin Other (See Comments)    History of GI bleed    Review of Systems  Constitutional:  Negative for fever.  Gastrointestinal:  Positive for constipation and heartburn. Negative for abdominal pain, diarrhea, nausea and vomiting.  All other systems reviewed and are negative.    OBJECTIVE:    Vitals:   03/27/21 0857  BP: (!) 144/66  Pulse: 63  Temp: (!) 97.2 F (36.2 C)  TempSrc: Temporal  Weight: 205 lb 9.6 oz (93.3 kg)     Body mass index is 27.88 kg/m.  Physical Exam Constitutional:      General: He is not in acute distress.    Appearance: Normal appearance.  HENT:     Head: Normocephalic and atraumatic.  Pulmonary:     Effort: Pulmonary effort is normal. No respiratory distress.  Abdominal:     General: There is no distension.     Palpations: Abdomen is soft.     Tenderness: There is no abdominal tenderness.  Neurological:     General: No focal deficit present.     Mental Status: He is alert and oriented to person, place, and time.  Psychiatric:        Mood and Affect: Mood normal.        Behavior: Behavior normal.     Labs and Microbiology:  CBC Latest Ref Rng & Units 01/13/2021 10/23/2020 12/24/2017  WBC 4.0 - 10.5 K/uL 5.3 5.5 -  Hemoglobin 13.0 - 17.0 g/dL 11.9(L) 12.5(L) 15.3  Hematocrit 39.0 - 52.0 % 37.0(L) 38.4(L) 45.0  Platelets 150.0 - 400.0 K/uL 320.0 350.0 -   CMP Latest Ref Rng & Units 10/16/2020 10/16/2019 10/09/2018  Glucose 70 - 99 mg/dL 134(H) 113(H) 128(H)  BUN 6 - 23  mg/dL 13 16 -  Creatinine 0.40 - 1.50 mg/dL 0.95 0.87 -  Sodium 135 - 145 mEq/L 139 137 -  Potassium 3.5 - 5.1 mEq/L 4.5 4.6 -  Chloride 96 - 112 mEq/L 104 102 -   CO2 19 - 32 mEq/L 27 31 -  Calcium 8.4 - 10.5 mg/dL 9.2 9.1 -  Total Protein 6.0 - 8.3 g/dL 7.3 6.8 -  Total Bilirubin 0.2 - 1.2 mg/dL 0.7 0.7 -  Alkaline Phos 39 - 117 U/L 50 56 -  AST 0 - 37 U/L 21 21 -  ALT 0 - 53 U/L 19 17 -      ASSESSMENT & PLAN:    H. pylori infection He has H pylori that did not respond to Bismuth quadruple therapy recently.  Will attempt to treat with Amoxicillin 1gm TID and high dose PPI with omeprazole 20mg  BID x 14 days.  Advised patient to have eradication testing done 4 weeks after completing therapy.  Also advised to hold PPI for 2 weeks leading up to eradication testing and to not eat or drink anything for at least an hour prior to urea breath test. He should also hold his iron while taking the H pylori regimen and can resume this after completion.  RTC 6-8 weeks.     Raynelle Highland for Infectious Disease [facility] 03/27/2021, 9:21 AM

## 2021-03-30 DIAGNOSIS — N3943 Post-void dribbling: Secondary | ICD-10-CM | POA: Diagnosis not present

## 2021-03-30 DIAGNOSIS — R3912 Poor urinary stream: Secondary | ICD-10-CM | POA: Diagnosis not present

## 2021-03-30 DIAGNOSIS — N401 Enlarged prostate with lower urinary tract symptoms: Secondary | ICD-10-CM | POA: Diagnosis not present

## 2021-04-23 ENCOUNTER — Other Ambulatory Visit: Payer: Medicare Other

## 2021-04-23 ENCOUNTER — Other Ambulatory Visit: Payer: Self-pay

## 2021-04-23 DIAGNOSIS — I483 Typical atrial flutter: Secondary | ICD-10-CM | POA: Diagnosis not present

## 2021-04-23 DIAGNOSIS — I4891 Unspecified atrial fibrillation: Secondary | ICD-10-CM

## 2021-04-23 DIAGNOSIS — I480 Paroxysmal atrial fibrillation: Secondary | ICD-10-CM | POA: Diagnosis not present

## 2021-04-23 DIAGNOSIS — I10 Essential (primary) hypertension: Secondary | ICD-10-CM

## 2021-04-23 DIAGNOSIS — K922 Gastrointestinal hemorrhage, unspecified: Secondary | ICD-10-CM

## 2021-04-23 LAB — CBC WITH DIFFERENTIAL/PLATELET
Basophils Absolute: 0 10*3/uL (ref 0.0–0.2)
Basos: 1 %
EOS (ABSOLUTE): 0 10*3/uL (ref 0.0–0.4)
Eos: 1 %
Hematocrit: 37.3 % — ABNORMAL LOW (ref 37.5–51.0)
Hemoglobin: 12.4 g/dL — ABNORMAL LOW (ref 13.0–17.7)
Lymphocytes Absolute: 2 10*3/uL (ref 0.7–3.1)
Lymphs: 34 %
MCH: 27.1 pg (ref 26.6–33.0)
MCHC: 33.2 g/dL (ref 31.5–35.7)
MCV: 81 fL (ref 79–97)
Monocytes Absolute: 0.4 10*3/uL (ref 0.1–0.9)
Monocytes: 6 %
Neutrophils Absolute: 3.4 10*3/uL (ref 1.4–7.0)
Neutrophils: 58 %
Platelets: 324 10*3/uL (ref 150–450)
RBC: 4.58 x10E6/uL (ref 4.14–5.80)
RDW: 21.8 % — ABNORMAL HIGH (ref 11.6–15.4)
WBC: 5.8 10*3/uL (ref 3.4–10.8)

## 2021-04-23 LAB — BASIC METABOLIC PANEL
BUN/Creatinine Ratio: 15 (ref 10–24)
BUN: 13 mg/dL (ref 8–27)
CO2: 27 mmol/L (ref 20–29)
Calcium: 9.2 mg/dL (ref 8.6–10.2)
Chloride: 106 mmol/L (ref 96–106)
Creatinine, Ser: 0.85 mg/dL (ref 0.76–1.27)
Glucose: 125 mg/dL — ABNORMAL HIGH (ref 70–99)
Potassium: 4.5 mmol/L (ref 3.5–5.2)
Sodium: 140 mmol/L (ref 134–144)
eGFR: 91 mL/min/{1.73_m2} (ref >59)

## 2021-04-24 ENCOUNTER — Ambulatory Visit: Payer: Medicare Other | Admitting: Family Medicine

## 2021-05-01 ENCOUNTER — Ambulatory Visit (INDEPENDENT_AMBULATORY_CARE_PROVIDER_SITE_OTHER): Payer: Medicare Other | Admitting: Family Medicine

## 2021-05-01 ENCOUNTER — Other Ambulatory Visit: Payer: Self-pay

## 2021-05-01 ENCOUNTER — Encounter: Payer: Self-pay | Admitting: Family Medicine

## 2021-05-01 VITALS — BP 118/64 | HR 64 | Temp 97.9°F | Ht 72.0 in | Wt 201.0 lb

## 2021-05-01 DIAGNOSIS — E119 Type 2 diabetes mellitus without complications: Secondary | ICD-10-CM

## 2021-05-01 DIAGNOSIS — I2510 Atherosclerotic heart disease of native coronary artery without angina pectoris: Secondary | ICD-10-CM | POA: Diagnosis not present

## 2021-05-01 LAB — POCT GLYCOSYLATED HEMOGLOBIN (HGB A1C): Hemoglobin A1C: 6.2 % — AB (ref 4.0–5.6)

## 2021-05-01 MED ORDER — IRON (FERROUS SULFATE) 325 (65 FE) MG PO TABS: 325.0000 mg | ORAL_TABLET | ORAL | Status: DC

## 2021-05-01 NOTE — Progress Notes (Signed)
This visit occurred during the SARS-CoV-2 public health emergency.  Safety protocols were in place, including screening questions prior to the visit, additional usage of staff PPE, and extensive cleaning of exam room while observing appropriate contact time as indicated for disinfecting solutions.  Diabetes:  No meds.   Hypoglycemic episodes: no sx Hyperglycemic episodes: no sx Feet problems: no Blood Sugars averaging: not checked.  eye exam within last year: f/u pending per patient report.    He is scheduled for ablation in 05/2021.    Prev anemia d/w pt.  He had EGD and colonoscopy 2022.  He had treatment for H pylori.  Still anticoagulated.  He is going to have f/u with the GI clinic re: H pylori eradication.   Meds, vitals, and allergies reviewed.   ROS: Per HPI unless specifically indicated in ROS section   GEN: nad, alert and oriented HEENT: ncat NECK: supple w/o LA CV: rrr. PULM: ctab, no inc wob ABD: soft, +bs EXT: no edema SKIN: well perfused.

## 2021-05-01 NOTE — Patient Instructions (Addendum)
Plan on recheck in March 2023.  We can recheck labs at the visit.  You don't need to fast.  We can see about rechecking your sugar/iron/blood counts at the visit.  I would keep taking iron every other day for now.  Take care.  Glad to see you.  Your A1c was better at 6.2.  Keep going as is.

## 2021-05-06 ENCOUNTER — Telehealth (HOSPITAL_COMMUNITY): Payer: Self-pay | Admitting: Emergency Medicine

## 2021-05-06 NOTE — Assessment & Plan Note (Signed)
His A1c was better at 6.2.  Keep going as is.    Plan on recheck in March 2023.  We can recheck labs at the visit.  We can see about rechecking sugar/iron/blood counts at the visit.  I would keep taking iron every other day for now.   He agrees with plan.

## 2021-05-06 NOTE — Telephone Encounter (Signed)
Reaching out to patient to offer assistance regarding upcoming cardiac imaging study; pt verbalizes understanding of appt date/time, parking situation and where to check in, pre-test NPO status and medications ordered, and verified current allergies; name and call back number provided for further questions should they arise Marchia Bond RN Navigator Cardiac Imaging [facility] [phone] office [phone] cell  Arrival 730a Denies iv issues other than small veins (encouraged to drink plenty of water) Daily meds per usual

## 2021-05-08 ENCOUNTER — Ambulatory Visit (HOSPITAL_COMMUNITY): Payer: Medicare Other

## 2021-05-08 ENCOUNTER — Ambulatory Visit (HOSPITAL_COMMUNITY)
Admission: RE | Admit: 2021-05-08 | Discharge: 2021-05-08 | Disposition: A | Discharge status: Home / Self Care | Payer: Medicare Other | Source: Ambulatory Visit | Attending: Cardiology | Admitting: Cardiology

## 2021-05-08 ENCOUNTER — Other Ambulatory Visit: Payer: Self-pay

## 2021-05-08 DIAGNOSIS — I4891 Unspecified atrial fibrillation: Secondary | ICD-10-CM | POA: Diagnosis not present

## 2021-05-08 MED ORDER — DILTIAZEM HCL 25 MG/5ML IV SOLN: 5.0000 mg | INTRAVENOUS | Status: DC | PRN

## 2021-05-08 MED ORDER — METOPROLOL TARTRATE 5 MG/5ML IV SOLN: 5.0000 mg | INTRAVENOUS | Status: DC | PRN

## 2021-05-08 MED ORDER — IOHEXOL 350 MG/ML SOLN
100.0000 mL | Freq: Once | INTRAVENOUS | Status: AC | PRN
  Administered 2021-05-08: 08:00:00 100 mL via INTRAVENOUS

## 2021-05-14 NOTE — Pre-Procedure Instructions (Signed)
Attempted to call patient.  No answer.

## 2021-05-15 ENCOUNTER — Ambulatory Visit (HOSPITAL_COMMUNITY): Payer: Medicare Other | Admitting: Anesthesiology

## 2021-05-15 ENCOUNTER — Other Ambulatory Visit: Payer: Self-pay

## 2021-05-15 ENCOUNTER — Encounter (HOSPITAL_COMMUNITY)
Admission: RE | Disposition: A | Discharge status: Home / Self Care | Payer: Medicare Other | Source: Home / Self Care | Attending: Cardiology

## 2021-05-15 ENCOUNTER — Ambulatory Visit (HOSPITAL_COMMUNITY)
Admission: RE | Admit: 2021-05-15 | Discharge: 2021-05-15 | Disposition: A | Discharge status: Home / Self Care | Payer: Medicare Other | Attending: Cardiology | Admitting: Cardiology

## 2021-05-15 ENCOUNTER — Encounter (HOSPITAL_COMMUNITY): Payer: Self-pay | Admitting: Cardiology

## 2021-05-15 DIAGNOSIS — E119 Type 2 diabetes mellitus without complications: Secondary | ICD-10-CM | POA: Diagnosis not present

## 2021-05-15 DIAGNOSIS — Z7901 Long term (current) use of anticoagulants: Secondary | ICD-10-CM | POA: Diagnosis not present

## 2021-05-15 DIAGNOSIS — I480 Paroxysmal atrial fibrillation: Secondary | ICD-10-CM | POA: Insufficient documentation

## 2021-05-15 DIAGNOSIS — I509 Heart failure, unspecified: Secondary | ICD-10-CM | POA: Diagnosis not present

## 2021-05-15 DIAGNOSIS — K922 Gastrointestinal hemorrhage, unspecified: Secondary | ICD-10-CM | POA: Insufficient documentation

## 2021-05-15 DIAGNOSIS — I10 Essential (primary) hypertension: Secondary | ICD-10-CM | POA: Insufficient documentation

## 2021-05-15 DIAGNOSIS — I483 Typical atrial flutter: Secondary | ICD-10-CM | POA: Insufficient documentation

## 2021-05-15 DIAGNOSIS — I110 Hypertensive heart disease with heart failure: Secondary | ICD-10-CM | POA: Diagnosis not present

## 2021-05-15 DIAGNOSIS — I4891 Unspecified atrial fibrillation: Secondary | ICD-10-CM | POA: Diagnosis not present

## 2021-05-15 DIAGNOSIS — I2510 Atherosclerotic heart disease of native coronary artery without angina pectoris: Secondary | ICD-10-CM | POA: Diagnosis not present

## 2021-05-15 HISTORY — PX: ATRIAL FIBRILLATION ABLATION: EP1191

## 2021-05-15 LAB — POCT ACTIVATED CLOTTING TIME
Activated Clotting Time: 329 seconds
Activated Clotting Time: 347 seconds
Activated Clotting Time: 305 seconds

## 2021-05-15 LAB — GLUCOSE, CAPILLARY
Glucose-Capillary: 129 mg/dL — ABNORMAL HIGH (ref 70–99)
Glucose-Capillary: 156 mg/dL — ABNORMAL HIGH (ref 70–99)

## 2021-05-15 SURGERY — ATRIAL FIBRILLATION ABLATION
Anesthesia: General

## 2021-05-15 MED ORDER — HEPARIN (PORCINE) IN NACL 1000-0.9 UT/500ML-% IV SOLN
INTRAVENOUS | Status: AC
  Filled 2021-05-15: qty 500

## 2021-05-15 MED ORDER — PHENYLEPHRINE 40 MCG/ML (10ML) SYRINGE FOR IV PUSH (FOR BLOOD PRESSURE SUPPORT)
PREFILLED_SYRINGE | INTRAVENOUS | Status: DC | PRN
  Administered 2021-05-15: 80 ug via INTRAVENOUS
  Administered 2021-05-15 (×2): 120 ug via INTRAVENOUS
  Administered 2021-05-15: 80 ug via INTRAVENOUS

## 2021-05-15 MED ORDER — HEPARIN SODIUM (PORCINE) 1000 UNIT/ML IJ SOLN
INTRAMUSCULAR | Status: DC | PRN
  Administered 2021-05-15 (×2): 1000 [IU] via INTRAVENOUS

## 2021-05-15 MED ORDER — FENTANYL CITRATE (PF) 250 MCG/5ML IJ SOLN
INTRAMUSCULAR | Status: DC | PRN
  Administered 2021-05-15: 100 ug via INTRAVENOUS

## 2021-05-15 MED ORDER — ACETAMINOPHEN 325 MG PO TABS: 650.0000 mg | ORAL_TABLET | ORAL | Status: DC | PRN

## 2021-05-15 MED ORDER — SODIUM CHLORIDE 0.9 % IV SOLN: INTRAVENOUS | Status: DC

## 2021-05-15 MED ORDER — SODIUM CHLORIDE 0.9% FLUSH: 3.0000 mL | Freq: Two times a day (BID) | INTRAVENOUS | Status: DC

## 2021-05-15 MED ORDER — HEPARIN SODIUM (PORCINE) 1000 UNIT/ML IJ SOLN
INTRAMUSCULAR | Status: AC
  Filled 2021-05-15: qty 10

## 2021-05-15 MED ORDER — PHENYLEPHRINE HCL-NACL 20-0.9 MG/250ML-% IV SOLN
INTRAVENOUS | Status: DC | PRN
  Administered 2021-05-15: 25 ug/min via INTRAVENOUS

## 2021-05-15 MED ORDER — SUGAMMADEX SODIUM 200 MG/2ML IV SOLN
INTRAVENOUS | Status: DC | PRN
  Administered 2021-05-15: 200 mg via INTRAVENOUS

## 2021-05-15 MED ORDER — HEPARIN SODIUM (PORCINE) 1000 UNIT/ML IJ SOLN
INTRAMUSCULAR | Status: DC | PRN
  Administered 2021-05-15: 3000 [IU] via INTRAVENOUS
  Administered 2021-05-15: 14000 [IU] via INTRAVENOUS
  Administered 2021-05-15: 4000 [IU] via INTRAVENOUS

## 2021-05-15 MED ORDER — DEXAMETHASONE SODIUM PHOSPHATE 10 MG/ML IJ SOLN
INTRAMUSCULAR | Status: DC | PRN
Start: 2021-05-15 — End: 2021-05-15
  Administered 2021-05-15: 4 mg via INTRAVENOUS

## 2021-05-15 MED ORDER — HEPARIN (PORCINE) IN NACL 1000-0.9 UT/500ML-% IV SOLN
INTRAVENOUS | Status: DC | PRN
  Administered 2021-05-15 (×4): 500 mL

## 2021-05-15 MED ORDER — ROCURONIUM BROMIDE 10 MG/ML (PF) SYRINGE
PREFILLED_SYRINGE | INTRAVENOUS | Status: DC | PRN
  Administered 2021-05-15: 5 mg via INTRAVENOUS
  Administered 2021-05-15: 80 mg via INTRAVENOUS

## 2021-05-15 MED ORDER — PROPOFOL 10 MG/ML IV BOLUS
INTRAVENOUS | Status: DC | PRN
  Administered 2021-05-15: 50 mg via INTRAVENOUS
  Administered 2021-05-15: 150 mg via INTRAVENOUS

## 2021-05-15 MED ORDER — SODIUM CHLORIDE 0.9 % IV SOLN: 250.0000 mL | INTRAVENOUS | Status: DC | PRN

## 2021-05-15 MED ORDER — LIDOCAINE 2% (20 MG/ML) 5 ML SYRINGE
INTRAMUSCULAR | Status: DC | PRN
Start: 2021-05-15 — End: 2021-05-15
  Administered 2021-05-15: 100 mg via INTRAVENOUS

## 2021-05-15 MED ORDER — ISOPROTERENOL HCL 0.2 MG/ML IJ SOLN
INTRAVENOUS | Status: DC | PRN
  Administered 2021-05-15: 4 ug/min via INTRAVENOUS

## 2021-05-15 MED ORDER — ONDANSETRON HCL 4 MG/2ML IJ SOLN: 4.0000 mg | Freq: Four times a day (QID) | INTRAMUSCULAR | Status: DC | PRN

## 2021-05-15 MED ORDER — PROTAMINE SULFATE 10 MG/ML IV SOLN
INTRAVENOUS | Status: DC | PRN
Start: 2021-05-15 — End: 2021-05-15
  Administered 2021-05-15: 10 mg via INTRAVENOUS
  Administered 2021-05-15: 15 mg via INTRAVENOUS
  Administered 2021-05-15: 10 mg via INTRAVENOUS

## 2021-05-15 MED ORDER — ISOPROTERENOL HCL 0.2 MG/ML IJ SOLN
INTRAMUSCULAR | Status: AC
  Filled 2021-05-15: qty 5

## 2021-05-15 MED ORDER — ONDANSETRON HCL 4 MG/2ML IJ SOLN
INTRAMUSCULAR | Status: DC | PRN
  Administered 2021-05-15: 4 mg via INTRAVENOUS

## 2021-05-15 MED ORDER — SODIUM CHLORIDE 0.9% FLUSH: 3.0000 mL | INTRAVENOUS | Status: DC | PRN

## 2021-05-15 SURGICAL SUPPLY — 18 items
CATH OCTARAY 2.0 F 3-3-3-3-3 (CATHETERS) ×1 IMPLANT
CATH S CIRCA THERM PROBE 10F (CATHETERS) ×1 IMPLANT
CATH SMTCH THERMOCOOL SF FJ (CATHETERS) ×1 IMPLANT
CATH SOUNDSTAR ECO 8FR (CATHETERS) ×1 IMPLANT
CATH WEB BI DIR CSDF CRV REPRO (CATHETERS) ×1 IMPLANT
CLOSURE PERCLOSE PROSTYLE (VASCULAR PRODUCTS) ×3 IMPLANT
COVER SWIFTLINK CONNECTOR (BAG) ×2 IMPLANT
MAT PREVALON FULL STRYKER (MISCELLANEOUS) ×1 IMPLANT
PACK EP LATEX FREE (CUSTOM PROCEDURE TRAY) ×2
PACK EP LF (CUSTOM PROCEDURE TRAY) ×1 IMPLANT
PAD DEFIB RADIO PHYSIO CONN (PAD) ×2 IMPLANT
PATCH CARTO3 (PAD) ×1 IMPLANT
SHEATH BAYLIS TRANSSEPTAL 98CM (NEEDLE) ×1 IMPLANT
SHEATH CARTO VIZIGO SM CVD (SHEATH) ×1 IMPLANT
SHEATH PINNACLE 8F 10CM (SHEATH) ×2 IMPLANT
SHEATH PINNACLE 9F 10CM (SHEATH) ×1 IMPLANT
SHEATH PROBE COVER 6X72 (BAG) ×1 IMPLANT
TUBING SMART ABLATE COOLFLOW (TUBING) ×1 IMPLANT

## 2021-05-15 NOTE — Discharge Instructions (Addendum)
Post procedure care instructions No driving for 4 days. No lifting over 5 lbs for 1 week. No vigorous or sexual activity for 1 week. You may return to work/your usual activities on 1. Keep procedure site clean & dry. If you notice increased pain, swelling, bleeding or pus, call/return!  You may shower after 24 hours, but no soaking in baths/hot tubs/pools for 1 week.    You have an appointment set up with the [facility].  Multiple studies have shown that being followed by a dedicated atrial fibrillation clinic in addition to the standard care you receive from your other physicians improves health. We believe that enrollment in the atrial fibrillation clinic will allow [place] to better care for you.   The phone number to the [facility] is [phone]. The clinic is staffed Monday through Friday from 8:30am to 5pm.  Parking Directions: The clinic is located in the Heart and Vascular Building connected to [facility]. 1)From [address] turn on to [address] and go to the 3rd entrance  (Heart and Vascular entrance) on the right. 2)Look to the right for Heart &Vascular Parking Garage. 3)A code for the entrance is required, for Feb is 1204.   4)Take the elevators to the 1st floor. Registration is in the room with the glass walls at the end of the hallway.  If you have any trouble parking or locating the clinic, please dont hesitate to call [phone].

## 2021-05-15 NOTE — Progress Notes (Signed)
Pt ambulated without difficulty or bleeding.   Discharged home with his niece who will drive and stay with pt x 24 hrs.

## 2021-05-15 NOTE — H&P (Signed)
Electrophysiology Office Follow up Visit Note:     Date:  05/15/2021    ID:  Paul Fry, DOB Jan 22, 1946, MRN [id number]   PCP:  [name], MD           [facility] Cardiologist:  Truitt Merle, NP (Inactive)  [facility] Electrophysiologist:  Vickie Epley, MD      Interval History:     Paul Fry is a 76 y.o. male who presents for a follow up visit. They were last seen in clinic January 06, 2021.  That visit was in the setting of a recent diagnosis of atrial fibrillation by Dr. [name].  He also has a mildly reduced left ventricular function with an ejection fraction of 45%.  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  We ordered a ZIO monitor at our last appointment to help better understand the burden of atrial fibrillation.   Today he presents for follow-up.  His daughter, Paul Fry, is on the phone during our visit.  We discussed the results of his monitor today.  He continues to feel intermittent shortness of breath and fatigue.   Today he is doing well. No problems with blood thinner recently.   Objective          Past Medical History:  Diagnosis Date   Allergy      SEASONAL   Anemia     Arthritis      KNEES   BPH (benign prostatic hyperplasia)     Diverticulosis of colon     Elevated PSA      1.22 on 01-20-2016   Feeling of incomplete bladder emptying     GERD (gastroesophageal reflux disease)     Hiatal hernia     History of adenomatous polyp of colon      2014 tubular adenoma   History of chronic gastritis     History of Helicobacter pylori infection      07/ 2016   History of lower GI bleeding      07/ 2010 and 12/ 2011  diverticular bleed both times   Hyperlipidemia     Hypertension     Lower urinary tract symptoms (LUTS)     Nonischemic cardiomyopathy (HCC)     Plantar fascia syndrome     Strains to urinate      INTERMITTANT   Type 2 diabetes, diet controlled ([place])      ON BORDER LINE,NEVER TOOK MEDICATION.   Wears  glasses             Past Surgical History:  Procedure Laterality Date   APPENDECTOMY   teenager   CARDIAC CATHETERIZATION   09-10-2008   dr dalton mclean    mild non-obstructive CAD;  30-40% mRCA, 20%pLAD,  ef 55%   CARDIOVASCULAR STRESS TEST   07/29/2011    Low risk nuclear study w/ no ischemia or infarct/  LVEF 47% and  apical hypokinesis (08-07-2012 cardiac MRI -- EF 49% w/ no hyperenhancement distal septal and apical hypokinesis, mild LAE, mild RVE, mild AR with mild dilated sinus 27mm)   CATARACT EXTRACTION W/ INTRAOCULAR LENS  IMPLANT, BILATERAL   2016   COLONOSCOPY   last one 12-21-2012   CYSTOSCOPY WITH INSERTION OF UROLIFT N/A 06/17/2016    Procedure: CYSTOSCOPY WITH INSERTION OF UROLIFT;  Surgeon: [name], MD;  Location: [facility];  Service: Urology;  Laterality: N/A;   ESOPHAGOGASTRODUODENOSCOPY   last one 11-22-2014   TRANSTHORACIC ECHOCARDIOGRAM  09/17/2014    ef 50-55%/  trivial AR, MR and PR/  mild LAE and RAE/  mild TR      Current Medications: Active Medications  No outpatient medications have been marked as taking for the 02/23/21 encounter (Office Visit) with Vickie Epley, MD.       Current Facility-Administered Medications for the 02/23/21 encounter (Office Visit) with Vickie Epley, MD  Medication   0.9 %  sodium chloride infusion        Allergies:   Aspirin    Social History         Socioeconomic History   Marital status: Married      Spouse name: Not on file   Number of children: 0   Years of education: Not on file   Highest education level: Not on file  Occupational History   Occupation: truck driver- retired      Fish farm manager: RETIRED  Tobacco Use   Smoking status: Never   Smokeless tobacco: Never  Vaping Use   Vaping Use: Never used  Substance and Sexual Activity   Alcohol use: Yes      Comment: rarely drinks beer   Drug use: No   Sexual activity: Not Currently  Other Topics Concern   Not on file   Social History Narrative    Married 2003, no children.     Retired Administrator, gets regular exercise- walking.     Step daughter is MD in [place]    Social Determinants of Health       Financial Resource Strain: Low Risk    Difficulty of Paying Living Expenses: Not very hard  Food Insecurity: Not on file  Transportation Needs: Not on file  Physical Activity: Not on file  Stress: Not on file  Social Connections: Not on file      Family History: The patient's family history includes Colon cancer (age of onset: 37) in his mother; Colon polyps in his brother; Heart disease in his sister; Hypertension in his sister and sister; Kidney disease in his sister; Lung cancer in his brother. There is no history of Diabetes, Depression, Stroke, Alcohol abuse, Prostate cancer, Liver cancer, Rectal cancer, or Stomach cancer.   ROS:   Please see the history of present illness.    All other systems reviewed and are negative.   EKGs/Labs/Other Studies Reviewed:     The following studies were reviewed today:   January 26, 2021 ZIO monitor personally reviewed HR 33 - 191bpm, average 62bpm. 1% burden of AF/AFL with longest episode lasting 28 minutes. Day to day variation in AF burden (<1% - 4%) Occasional supraventricular ectopy, 3.2%. Rare ventricular ectopy, < 1%.     EKG:  The ekg ordered today demonstrates sinus rhythm.   Recent Labs: 10/16/2020: ALT 19; BUN 13; Creatinine, Ser 0.95; Potassium 4.5; Sodium 139 01/13/2021: Hemoglobin 11.9; Platelets 320.0  Recent Lipid Panel Labs (Brief)          Component Value Date/Time    CHOL 129 10/16/2020 0904    TRIG 115.0 10/16/2020 0904    HDL 30.40 (L) 10/16/2020 0904    CHOLHDL 4 10/16/2020 0904    VLDL 23.0 10/16/2020 0904    LDLCALC 76 10/16/2020 0904        Physical Exam:     VS:  BP 124/70    Pulse (!) 57    Ht 6' (1.829 m)    Wt 203 lb (92.1 kg)    BMI 27.53 kg/m  Wt Readings from Last 3 Encounters:  02/23/21 203 lb  (92.1 kg)  01/06/21 199 lb (90.3 kg)  01/05/21 199 lb (90.3 kg)      GEN:  Well nourished, well developed in no acute distress HEENT: Normal NECK: No JVD; No carotid bruits LYMPHATICS: No lymphadenopathy CARDIAC: RRR, no murmurs, rubs, gallops RESPIRATORY:  Clear to auscultation without rales, wheezing or rhonchi  ABDOMEN: Soft, non-tender, non-distended MUSCULOSKELETAL:  No edema; No deformity  SKIN: Warm and dry NEUROLOGIC:  Alert and oriented x 3 PSYCHIATRIC:  Normal affect            Assessment     ASSESSMENT:     1. Paroxysmal atrial fibrillation (HCC)   2. Typical atrial flutter ([place])   3. Primary hypertension   4. Gastrointestinal hemorrhage, unspecified gastrointestinal hemorrhage type     PLAN:     In order of problems listed above:   1. Paroxysmal atrial fibrillation (HCC) Daily burdens range from less than 1% to 4% on recent Holter monitor.  Given he still paroxysmal, would favor addressing the A. fib around the same time as left atrial appendage closure.  I discussed the ablation procedure in detail with the patient including the risk, recovery and efficacy.  He wishes to proceed.  6 to 8 weeks after ablation, would proceed with watchman implant.  He will continue Xarelto uninterrupted until at least 45 days after the watchman implant.     Risk, benefits, and alternatives to EP study and radiofrequency ablation for afib were also discussed in detail today. These risks include but are not limited to stroke, bleeding, vascular damage, tamponade, perforation, damage to the esophagus, lungs, and other structures, pulmonary vein stenosis, worsening renal function, and death. The patient understands these risk and wishes to proceed.  We will therefore proceed with catheter ablation at the next available time.  Carto, ICE, anesthesia are requested for the procedure.  Will also obtain CT PV protocol prior to the procedure to exclude LAA thrombus and further evaluate atrial  anatomy.     He will also need an echocardiogram prior to the procedure.     Ablation strategy will be PVI plus CTI ablation.     -------------------------   I have seen, examined the patient, and reviewed the above assessment and plan.    Plan for PVI+CTI today. Staged LAAO.   Vickie Epley, MD 05/15/2021 7:16 AM

## 2021-05-15 NOTE — Transfer of Care (Signed)
Immediate Anesthesia Transfer of Care Note  Patient: Paul Fry  Procedure(s) Performed: ATRIAL FIBRILLATION ABLATION  Patient Location: PACU  Anesthesia Type:General  Level of Consciousness: oriented, drowsy and patient cooperative  Airway & Oxygen Therapy: Patient Spontanous Breathing and Patient connected to nasal cannula oxygen  Post-op Assessment: Report given to RN and Post -op Vital signs reviewed and stable  Post vital signs: Reviewed  Last Vitals:  Vitals Value Taken Time  BP 117/56 05/15/21 1105  Temp    Pulse 70 05/15/21 1106  Resp 19 05/15/21 1106  SpO2 97 % 05/15/21 1106  Vitals shown include unvalidated device data.  Last Pain:  Vitals:   05/15/21 0546  TempSrc:   PainSc: 0-No pain         Complications: No notable events documented.

## 2021-05-15 NOTE — Anesthesia Preprocedure Evaluation (Addendum)
Anesthesia Evaluation  Patient identified by MRN, date of birth, ID band Patient awake    Reviewed: Allergy & Precautions, NPO status , Patient's Chart, lab work & pertinent test results, reviewed documented beta blocker date and time   History of Anesthesia Complications Negative for: history of anesthetic complications  Airway Mallampati: II  TM Distance: >3 FB Neck ROM: Full    Dental  (+) Dental Advisory Given   Pulmonary neg pulmonary ROS,    Pulmonary exam normal        Cardiovascular hypertension, Pt. on medications and Pt. on home beta blockers + CAD and +CHF  + dysrhythmias Atrial Fibrillation  Rhythm:Irregular Rate:Normal   Echo 02/25/21: EF 50-55%, no RWMA, g1dd, normal RVSF, normal PASP, mild AV sclerosis w/o stenosis, aortic root 42 mm, ascending aorta 39 mm   Neuro/Psych negative neurological ROS     GI/Hepatic Neg liver ROS, hiatal hernia, GERD  ,  Endo/Other  diabetes, Type 2  Renal/GU negative Renal ROS  negative genitourinary   Musculoskeletal  (+) Arthritis ,   Abdominal   Peds  Hematology negative hematology ROS (+)   Anesthesia Other Findings   Reproductive/Obstetrics                            Anesthesia Physical Anesthesia Plan  ASA: 3  Anesthesia Plan: General   Post-op Pain Management: Minimal or no pain anticipated   Induction: Intravenous  PONV Risk Score and Plan: 2 and Ondansetron, Dexamethasone, Treatment may vary due to age or medical condition and Midazolam  Airway Management Planned: Oral ETT  Additional Equipment: None  Intra-op Plan:   Post-operative Plan: Extubation in OR  Informed Consent: I have reviewed the patients History and Physical, chart, labs and discussed the procedure including the risks, benefits and alternatives for the proposed anesthesia with the patient or authorized representative who has indicated his/her understanding  and acceptance.     Dental advisory given  Plan Discussed with:   Anesthesia Plan Comments:         Anesthesia Quick Evaluation

## 2021-05-15 NOTE — Anesthesia Postprocedure Evaluation (Signed)
Anesthesia Post Note  Patient: Paul Fry  Procedure(s) Performed: ATRIAL FIBRILLATION ABLATION     Anesthesia Type: General Level of consciousness: awake and alert Pain management: pain level controlled Vital Signs Assessment: post-procedure vital signs reviewed and stable Respiratory status: spontaneous breathing, nonlabored ventilation and respiratory function stable Cardiovascular status: blood pressure returned to baseline and stable Postop Assessment: no apparent nausea or vomiting Anesthetic complications: no   No notable events documented.  Last Vitals:  Vitals:   05/15/21 1200 05/15/21 1215  BP: (!) 130/53 (!) 128/45  Pulse: 64 67  Resp: 18 17  Temp:    SpO2: 99% 99%    Last Pain:  Vitals:   05/15/21 1153  TempSrc:   PainSc: 0-No pain                 Lidia Collum

## 2021-05-15 NOTE — Anesthesia Procedure Notes (Addendum)
Procedure Name: Intubation Date/Time: 05/15/2021 7:52 AM Performed by: Jenne Campus, CRNA Pre-anesthesia Checklist: Patient identified, Emergency Drugs available, Suction available and Patient being monitored Patient Re-evaluated:Patient Re-evaluated prior to induction Oxygen Delivery Method: Circle System Utilized Preoxygenation: Pre-oxygenation with 100% oxygen Induction Type: IV induction Ventilation: Mask ventilation without difficulty Laryngoscope Size: Glidescope and 4 Grade View: Grade I Tube type: Oral Tube size: 7.5 mm Number of attempts: 1 Airway Equipment and Method: Stylet and Oral airway Placement Confirmation: ETT inserted through vocal cords under direct vision, positive ETCO2 and breath sounds checked- equal and bilateral Secured at: 22 cm Tube secured with: Tape Dental Injury: Teeth and Oropharynx as per pre-operative assessment  Difficulty Due To: Difficult Airway- due to anterior larynx Comments: Smooth IV induction. Easy mask. DL x 1 Mil 3 CRNA. Grade 3 view. DL x 2 Mil 3, MAC 4 Grade 3 view. DL x 1 Video glide 4, Grade 1 view. 7.5 ETT easily passed through cords.  +ETCO2. BBSE.

## 2021-05-18 ENCOUNTER — Other Ambulatory Visit: Payer: Self-pay

## 2021-05-18 ENCOUNTER — Encounter (HOSPITAL_COMMUNITY): Payer: Self-pay | Admitting: Cardiology

## 2021-05-18 DIAGNOSIS — K922 Gastrointestinal hemorrhage, unspecified: Secondary | ICD-10-CM

## 2021-05-18 DIAGNOSIS — I480 Paroxysmal atrial fibrillation: Secondary | ICD-10-CM

## 2021-05-21 NOTE — Progress Notes (Signed)
[facility]  CHIEF COMPLAINT:    Follow up for H pylori  SUBJECTIVE:    Paul Fry is a 76 y.o. male with PMHx as below who presents to the clinic for H pylori.   Previously seen on 03/27/21.  Seen back in 2018 by GI.  EGD showed small hiatal hernia, gastritis, and an esophageal AVM.  Biopsy showed H pylori gastritis.  Treated with PPI and Pylera x 14 days.  Does not appear that eradication testing was completed.  Recently seen by GI again in July 2022 for recurrent GERD and underwent EGD.  He was noted to have a small AVM and gastritis.  Biopsy confirmed H pylori again.  He was treated with Pylera and PPI x 14 days.  Stool antigen was repeated earlier that month to confirm eradication and he remained positive.  He had previously completed the following regimens:  Tetracycline, Flagyl, Bismuth x 14 days (2018) Bismuth, Flagyl, Doxy, PPI x 14 days (2022)  Seen by me in November and treated with amoxicillin 1gm TID and high dose PPI (Omeprazole 20mg  BID) x 14 days which he tolerated.  He completed this course of therapy greater than 4 weeks ago.  He has however not held his PPI for the past 2 weeks but presents today for eradication testing.    Please see A&P for the details of today's visit and status of the patient's medical problems.   Patient's Medications  New Prescriptions   No medications on file  Previous Medications   ASCORBIC ACID (VITAMIN C) 1000 MG TABLET    Take 1,000 mg by mouth daily.   ATORVASTATIN (LIPITOR) 20 MG TABLET    Take 1 tablet (20 mg total) by mouth every morning.   CALCIUM CARBONATE (TUMS - DOSED IN MG ELEMENTAL CALCIUM) 500 MG CHEWABLE TABLET    Chew 1 tablet by mouth as needed for indigestion. Reported on 07/17/2015   CALCIUM-VITAMIN D PO    Take 1 tablet by mouth daily.   FISH OIL-OMEGA-3 FATTY ACIDS 1000 MG CAPSULE    Take 1 g by mouth daily.   IRON, FERROUS SULFATE, 325 (65 FE) MG TABS    Take 325 mg by mouth  every other day.   LOSARTAN (COZAAR) 25 MG TABLET    Take 0.5 tablets (12.5 mg total) by mouth every morning.   METOPROLOL SUCCINATE (TOPROL-XL) 50 MG 24 HR TABLET    Take 1 tablet (50 mg total) by mouth daily. Take with or immediately following a meal.   MULTIPLE VITAMIN (MULTIVITAMIN) TABLET    Take 1 tablet by mouth daily.   OMEPRAZOLE (PRILOSEC) 20 MG CAPSULE    Take 1 capsule (20 mg total) by mouth 2 (two) times daily.   RIVAROXABAN (XARELTO) 20 MG TABS TABLET    Take 1 tablet (20 mg total) by mouth daily with supper.   TAMSULOSIN (FLOMAX) 0.4 MG CAPS CAPSULE    Take 2 capsules (0.8 mg total) by mouth daily.  Modified Medications   No medications on file  Discontinued Medications   No medications on file      Past Medical History:  Diagnosis Date   Allergy    SEASONAL   Anemia    Arthritis    KNEES   BPH (benign prostatic hyperplasia)    Diverticulosis of colon    Elevated PSA    1.22 on 01-20-2016   Feeling of incomplete bladder emptying    GERD (gastroesophageal  reflux disease)    Hiatal hernia    History of adenomatous polyp of colon    2014 tubular adenoma   History of chronic gastritis    History of Helicobacter pylori infection    07/ 2016   History of lower GI bleeding    07/ 2010 and 12/ 2011  diverticular bleed both times   Hyperlipidemia    Hypertension    Lower urinary tract symptoms (LUTS)    Nonischemic cardiomyopathy (HCC)    Plantar fascia syndrome    Strains to urinate    INTERMITTANT   Type 2 diabetes, diet controlled ([place])    ON BORDER LINE,NEVER TOOK MEDICATION.   Wears glasses     Social History   Tobacco Use   Smoking status: Never   Smokeless tobacco: Never  Vaping Use   Vaping Use: Never used  Substance Use Topics   Alcohol use: Not Currently    Comment: rarely drinks beer   Drug use: No    Family History  Problem Relation Age of Onset   Colon cancer Mother 69   Heart disease Sister        MI, pace maker   Kidney disease  Sister        renal failure, nephrectomy- not cancer   Hypertension Sister    Hypertension Sister    Colon polyps Brother    Lung cancer Brother    Diabetes Neg Hx        DM   Depression Neg Hx    Stroke Neg Hx    Alcohol abuse Neg Hx        also no drug abuse    Prostate cancer Neg Hx    Liver cancer Neg Hx    Rectal cancer Neg Hx    Stomach cancer Neg Hx     Allergies  Allergen Reactions   Aspirin Other (See Comments)    History of GI bleed    Review of Systems  Constitutional: Negative.   Respiratory: Negative.    Cardiovascular: Negative.   Gastrointestinal: Negative.     OBJECTIVE:    Vitals:   05/22/21 0826  BP: 125/75  Pulse: 68  Temp: (!) 97.5 F (36.4 C)  TempSrc: Oral  SpO2: 99%  Weight: 202 lb (91.6 kg)   Body mass index is 27.4 kg/m.  Physical Exam Constitutional:      General: He is not in acute distress.    Appearance: Normal appearance.  HENT:     Head: Normocephalic and atraumatic.  Eyes:     Extraocular Movements: Extraocular movements intact.     Conjunctiva/sclera: Conjunctivae normal.  Pulmonary:     Effort: Pulmonary effort is normal. No respiratory distress.  Abdominal:     General: There is no distension.     Palpations: Abdomen is soft.     Tenderness: There is no abdominal tenderness.  Skin:    General: Skin is warm and dry.  Neurological:     General: No focal deficit present.     Mental Status: He is alert and oriented to person, place, and time.  Psychiatric:        Mood and Affect: Mood normal.        Behavior: Behavior normal.     Labs and Microbiology: CBC Latest Ref Rng & Units 04/23/2021 01/13/2021 10/23/2020  WBC 3.4 - 10.8 x10E3/uL 5.8 5.3 5.5  Hemoglobin 13.0 - 17.7 g/dL 12.4(L) 11.9(L) 12.5(L)  Hematocrit 37.5 - 51.0 % 37.3(L) 37.0(L) 38.4(L)  Platelets  150 - 450 x10E3/uL 324 320.0 350.0   CMP Latest Ref Rng & Units 04/23/2021 10/16/2020 10/16/2019  Glucose 70 - 99 mg/dL 125(H) 134(H) 113(H)  BUN 8 - 27  mg/dL 13 13 16   Creatinine 0.76 - 1.27 mg/dL 0.85 0.95 0.87  Sodium 134 - 144 mmol/L 140 139 137  Potassium 3.5 - 5.2 mmol/L 4.5 4.5 4.6  Chloride 96 - 106 mmol/L 106 104 102  CO2 20 - 29 mmol/L 27 27 31   Calcium 8.6 - 10.2 mg/dL 9.2 9.2 9.1  Total Protein 6.0 - 8.3 g/dL - 7.3 6.8  Total Bilirubin 0.2 - 1.2 mg/dL - 0.7 0.7  Alkaline Phos 39 - 117 U/L - 50 56  AST 0 - 37 U/L - 21 21  ALT 0 - 53 U/L - 19 17      ASSESSMENT & PLAN:    H. pylori infection Patient has completed recent salvage regimen of amox 1 gm TID and high dose PPI x 14 days.  Here for eradication testing but has continued to take his PPI so will not be able to do urea breath test today.  Advised to hold PPI for 2 weeks and RTC for breath test at that time.  If remains positive, will consider Levaquin based therapy vs Rifabutin based treatment.    Orders Placed This Encounter  Procedures   H. pylori breath test    Standing Status:   Future    Standing Expiration Date:   05/22/2022       [facility]nelle High[facility] 05/22/2021, 8:40 AM

## 2021-05-22 ENCOUNTER — Other Ambulatory Visit: Payer: Self-pay

## 2021-05-22 ENCOUNTER — Encounter: Payer: Self-pay | Admitting: Internal Medicine

## 2021-05-22 ENCOUNTER — Ambulatory Visit (INDEPENDENT_AMBULATORY_CARE_PROVIDER_SITE_OTHER): Payer: Medicare Other | Admitting: Internal Medicine

## 2021-05-22 VITALS — BP 125/75 | HR 68 | Temp 97.5°F | Wt 202.0 lb

## 2021-05-22 DIAGNOSIS — A048 Other specified bacterial intestinal infections: Secondary | ICD-10-CM | POA: Diagnosis not present

## 2021-05-22 NOTE — Assessment & Plan Note (Signed)
Patient has completed recent salvage regimen of amox 1 gm TID and high dose PPI x 14 days.  Here for eradication testing but has continued to take his PPI so will not be able to do urea breath test today.  Advised to hold PPI for 2 weeks and RTC for breath test at that time.  If remains positive, will consider Levaquin based therapy vs Rifabutin based treatment.

## 2021-05-22 NOTE — Patient Instructions (Signed)
Thank you for coming to see me today. It was a pleasure seeing you.  To Do:  Come back for H pylori eradication testing in 2 weeks Stop taking your omeprazole for 2 weeks starting now prior to eradication testing Do not eat or drink anything for at least 1-2 hours prior to coming back for eradication test  If you have any questions or concerns, please do not hesitate to call the office at [phone].  Take Care,   Jule Ser

## 2021-05-29 ENCOUNTER — Telehealth: Payer: Self-pay | Admitting: Cardiology

## 2021-05-29 NOTE — Telephone Encounter (Signed)
Pt c/o medication issue:  1. Name of Medication:  rivaroxaban (XARELTO) 20 MG TABS tablet  2. How are you currently taking this medication (dosage and times per day)?  As prescribed  3. Are you having a reaction (difficulty breathing--STAT)?  No   4. What is your medication issue?   Patient states he was approved for patient assistance for Xarelto for 6 months, but term is coming to an end. He received a letter stating the assistance has expired. Patient contacted his pharmacy and they advised him that the medication will be about $600. Unable to afford this, he would like to to discuss patient assistance eligibility or taking an alternative. He states he has about a 1 month supply remaining. Please assist.

## 2021-05-29 NOTE — Telephone Encounter (Signed)
The application can be completed at:  [email or web address]

## 2021-05-29 NOTE — Telephone Encounter (Signed)
I would still have patient complete the patient assistance application.  He may need to go through [place] rather than [employer]

## 2021-05-29 NOTE — Telephone Encounter (Signed)
**Note De-Identified  Obfuscation** The pt states that he cannot afford his co-pay for his Xarelto as he has a high deductible to meet. He states that he is not eligible for pt asst through [employer] as he has already s/w them. He has been getting his Xarelto from [employer] but he states that he called them and they advised him that their Xarelto program does not start until April.  He states that he cannot afford the cost for Xarelto and wants to switch to Warfarin until [employer] becomes available again.  He is aware that I am forwarding this call to our Coumadin Clinis and PharmD for advisement to the pt.

## 2021-06-01 NOTE — Telephone Encounter (Signed)
**Note De-Identified  Obfuscation** The pt cannot get his Xarelto until April from [facility] so he wants to switch to Warfarin until then as he cannot afford his Xarelto at the current cost.  Forwarding this message to our Coumadin Clinic for advisement to the pt on switching from Xarelto to warfarin.

## 2021-06-01 NOTE — Telephone Encounter (Addendum)
Called pt and he states he has over a month of Xarelto tablets left. Pt placed me on hold and went and counted his Xarelto medication, he states he has 30 days in a bottle and 12 tabs, which will be enough until March 6th, 2023. He states I'm having a procedure on 07/09/2021, asked more about the procedure he is having and he stated a Watchman. Confirmed in the chart and he is pending Watchman on 07/09/2021 and normally the pt continues Warfarin for at least 45 days post procedure. Pt wants to take all his Xarelto medication that he has to avoid not wasting meds and he is aware I understand this issue. Will check with pharmacist to see if anyway to give samples after procedure as he is pending a Watchman and do not want him to be at risk for a clot around the watchman or stroke since warfarin requires frequent monitoring and finding the dose that is tailored to the pt.   Spoke with Lenna Sciara, pharmacist regarding the pt. She states since pt does have the procedure coming up and does not want to waste the meds he has/wants to remain on Xarelto leading up to procedure, that we could do samples to get him past his procedure. She will speak with EP regarding this.

## 2021-06-01 NOTE — Telephone Encounter (Addendum)
Patient had an ablation on 05/15/21. Therefore he needs to remain fully anticoagulated for 90 days post procedure. Switching to warfarin at this time would require a bridge to ensure he stayed therapeutic. Patient has 42 tablets of Xarelto that he does not want to waste. He will need to be on anticoagulation for atleast 45 days post watchman procedure. Since switching to warfarin at this point is really not feasible, we will sample patient though the 45 days post watchman. 6 weeks of samples left for patient. I spoke with patient. He will pick up samples Monday. He was very appreciative of the assistance.

## 2021-06-01 NOTE — Telephone Encounter (Incomplete Revision)
Called pt and he states he has over a month of Xarelto tablets left. Pt placed me on hold and went and counted his Xarelto medication, he states he has 30 days in a bottle and 12 tabs, which will be enough until March 6th, 2023. He states I'm having a procedure on 07/09/2021, asked more about the procedure he is having and he stated a Watchman. Confirmed in the chart and he is pending Watchman on 07/09/2021 and normally the pt continues Warfarin for at least 45 days post procedure. Pt wants to take all his Xarelto medication that he has to avoid not wasting meds and he is aware I understand this issue. Will check with pharmacist to see if anyway to give samples after procedure as he is pending a Watchman and do not want him to be at risk for a clot around the watchman or stroke since warfarin requires frequent monitoring and finding the dose that is tailored to the pt.   Spoke with Lenna Sciara, pharmacist regarding the pt. She states since pt does have the procedure coming up and does not want to waste the meds he has/wants to remain on Xarelto leading up to procedure, that we could do samples to get him past his procedure. She will speak with EP regarding this.

## 2021-06-05 ENCOUNTER — Other Ambulatory Visit: Payer: Medicare Other

## 2021-06-05 ENCOUNTER — Other Ambulatory Visit: Payer: Self-pay

## 2021-06-05 DIAGNOSIS — A048 Other specified bacterial intestinal infections: Secondary | ICD-10-CM

## 2021-06-08 LAB — H. PYLORI BREATH TEST: H. pylori Breath Test: DETECTED — AB

## 2021-06-09 ENCOUNTER — Telehealth: Payer: Self-pay

## 2021-06-09 NOTE — Telephone Encounter (Signed)
-----   Message from Mignon Pine, DO sent at 06/09/2021  8:57 AM EST ----- Can you please advise patient that his H pylori test was still positive.  We will need to treat with an alternative salvage regimen again.  Can he be scheduled for follow up with me when he is available to do so in order to discuss regimen, side effects, and possible interactions with his current medications?  Virtual visit is fine if he would prefer that option.   Thanks

## 2021-06-09 NOTE — Telephone Encounter (Signed)
Patient aware of positive H. Pylori test - patient scheduled on 2/2 at 2:15.   [place], CMA

## 2021-06-10 ENCOUNTER — Telehealth: Payer: Self-pay | Admitting: Family Medicine

## 2021-06-10 NOTE — Telephone Encounter (Signed)
Please make sure patient checks with GI about 91-month follow-up colonoscopy.  Thanks.

## 2021-06-11 ENCOUNTER — Encounter: Payer: Self-pay | Admitting: Internal Medicine

## 2021-06-11 ENCOUNTER — Other Ambulatory Visit (HOSPITAL_COMMUNITY): Payer: Self-pay

## 2021-06-11 ENCOUNTER — Other Ambulatory Visit: Payer: Self-pay

## 2021-06-11 ENCOUNTER — Telehealth: Payer: Self-pay | Admitting: Gastroenterology

## 2021-06-11 ENCOUNTER — Ambulatory Visit (INDEPENDENT_AMBULATORY_CARE_PROVIDER_SITE_OTHER): Payer: Medicare Other | Admitting: Internal Medicine

## 2021-06-11 VITALS — BP 162/70 | HR 63 | Temp 97.9°F | Wt 203.0 lb

## 2021-06-11 DIAGNOSIS — A048 Other specified bacterial intestinal infections: Secondary | ICD-10-CM

## 2021-06-11 MED ORDER — RIFABUTIN 150 MG PO CAPS
300.0000 mg | ORAL_CAPSULE | Freq: Every day | ORAL | 0 refills | Status: AC
  Filled 2021-06-11 – 2021-06-12 (×2): qty 28, 14d supply, fill #0

## 2021-06-11 MED ORDER — AMOXICILLIN 250 MG PO CAPS
250.0000 mg | ORAL_CAPSULE | Freq: Three times a day (TID) | ORAL | 0 refills | Status: AC
  Filled 2021-06-11: qty 42, 14d supply, fill #0

## 2021-06-11 MED ORDER — PANTOPRAZOLE SODIUM 40 MG PO TBEC
40.0000 mg | DELAYED_RELEASE_TABLET | Freq: Two times a day (BID) | ORAL | 0 refills | Status: DC
  Filled 2021-06-11: qty 28, 14d supply, fill #0

## 2021-06-11 NOTE — Progress Notes (Signed)
[facility]  CHIEF COMPLAINT:    Follow up for H. Pylori infection  SUBJECTIVE:    Paul Fry is a 76 y.o. male with PMHx as below who presents to the clinic for H pylori infection.   Patient here today for routine follow up with out acute complaints.  I saw him previously on 05/22/21.  At that time, he had completed his 3rd round of treatment for H pylori with Amoxicillin 1gm TID and high dose PPI (prilosec 20mg  BID) x 14 days.  He returned for H pylori breath test for eradication testing on 06/05/21 and, unfortunately, this remained positive.  He is here today to discuss next steps in treatment.    Tetracycline, Flagyl, Bismuth x 14 days (2018) Bismuth, Flagyl, Doxy, PPI x 14 days (2022) Amoxicillin 1 gm TID and PPI x 14 days (2022)  Please see A&P for the details of today's visit and status of the patient's medical problems.   Patient's Medications  New Prescriptions   AMOXICILLIN (AMOXIL) 250 MG CAPSULE    Take 1 capsule (250 mg total) by mouth 3 (three) times daily for 14 days. Pt would like to be contacted for medication to be delivered   PANTOPRAZOLE (PROTONIX) 40 MG TABLET    Take 1 tablet (40 mg total) by mouth 2 (two) times daily for 14 days. Pt would like to be contacted for medication to be delivered   RIFABUTIN (MYCOBUTIN) 150 MG CAPSULE    Take 2 capsules (300 mg total) by mouth daily for 14 days.  Previous Medications   ASCORBIC ACID (VITAMIN C) 1000 MG TABLET    Take 1,000 mg by mouth daily.   ATORVASTATIN (LIPITOR) 20 MG TABLET    Take 1 tablet (20 mg total) by mouth every morning.   CALCIUM CARBONATE (TUMS - DOSED IN MG ELEMENTAL CALCIUM) 500 MG CHEWABLE TABLET    Chew 1 tablet by mouth as needed for indigestion. Reported on 07/17/2015   CALCIUM-VITAMIN D PO    Take 1 tablet by mouth daily.   FISH OIL-OMEGA-3 FATTY ACIDS 1000 MG CAPSULE    Take 1 g by mouth daily.   IRON, FERROUS SULFATE, 325 (65 FE) MG TABS    Take 325 mg by  mouth every other day.   LOSARTAN (COZAAR) 25 MG TABLET    Take 0.5 tablets (12.5 mg total) by mouth every morning.   METOPROLOL SUCCINATE (TOPROL-XL) 50 MG 24 HR TABLET    Take 1 tablet (50 mg total) by mouth daily. Take with or immediately following a meal.   MULTIPLE VITAMIN (MULTIVITAMIN) TABLET    Take 1 tablet by mouth daily.   RIVAROXABAN (XARELTO) 20 MG TABS TABLET    Take 1 tablet (20 mg total) by mouth daily with supper.   TAMSULOSIN (FLOMAX) 0.4 MG CAPS CAPSULE    Take 2 capsules (0.8 mg total) by mouth daily.  Modified Medications   No medications on file  Discontinued Medications   OMEPRAZOLE (PRILOSEC) 20 MG CAPSULE    Take 1 capsule (20 mg total) by mouth 2 (two) times daily.      Past Medical History:  Diagnosis Date   Allergy    SEASONAL   Anemia    Arthritis    KNEES   BPH (benign prostatic hyperplasia)    Diverticulosis of colon    Elevated PSA    1.22 on 01-20-2016   Feeling of incomplete bladder emptying  GERD (gastroesophageal reflux disease)    Hiatal hernia    History of adenomatous polyp of colon    2014 tubular adenoma   History of chronic gastritis    History of Helicobacter pylori infection    07/ 2016   History of lower GI bleeding    07/ 2010 and 12/ 2011  diverticular bleed both times   Hyperlipidemia    Hypertension    Lower urinary tract symptoms (LUTS)    Nonischemic cardiomyopathy (HCC)    Plantar fascia syndrome    Strains to urinate    INTERMITTANT   Type 2 diabetes, diet controlled ([place])    ON BORDER LINE,NEVER TOOK MEDICATION.   Wears glasses     Social History   Tobacco Use   Smoking status: Never   Smokeless tobacco: Never  Vaping Use   Vaping Use: Never used  Substance Use Topics   Alcohol use: Not Currently    Comment: rarely drinks beer   Drug use: No    Family History  Problem Relation Age of Onset   Colon cancer Mother 26   Heart disease Sister        MI, pace maker   Kidney disease Sister        renal  failure, nephrectomy- not cancer   Hypertension Sister    Hypertension Sister    Colon polyps Brother    Lung cancer Brother    Diabetes Neg Hx        DM   Depression Neg Hx    Stroke Neg Hx    Alcohol abuse Neg Hx        also no drug abuse    Prostate cancer Neg Hx    Liver cancer Neg Hx    Rectal cancer Neg Hx    Stomach cancer Neg Hx     Allergies  Allergen Reactions   Aspirin Other (See Comments)    History of GI bleed    Review of Systems  Constitutional: Negative.   Respiratory: Negative.    Cardiovascular: Negative.   Gastrointestinal: Negative.     OBJECTIVE:    Vitals:   06/11/21 1420  BP: (!) 162/70  Pulse: 63  Temp: 97.9 F (36.6 C)  TempSrc: Oral  SpO2: 99%  Weight: 203 lb (92.1 kg)   Body mass index is 27.53 kg/m.  Physical Exam Constitutional:      General: He is not in acute distress.    Appearance: Normal appearance.  HENT:     Head: Normocephalic and atraumatic.  Pulmonary:     Effort: Pulmonary effort is normal. No respiratory distress.  Skin:    General: Skin is warm and dry.  Neurological:     General: No focal deficit present.     Mental Status: He is alert and oriented to person, place, and time.  Psychiatric:        Mood and Affect: Mood normal.        Behavior: Behavior normal.     Labs and Microbiology: CBC Latest Ref Rng & Units 04/23/2021 01/13/2021 10/23/2020  WBC 3.4 - 10.8 x10E3/uL 5.8 5.3 5.5  Hemoglobin 13.0 - 17.7 g/dL 12.4(L) 11.9(L) 12.5(L)  Hematocrit 37.5 - 51.0 % 37.3(L) 37.0(L) 38.4(L)  Platelets 150 - 450 x10E3/uL 324 320.0 350.0   CMP Latest Ref Rng & Units 04/23/2021 10/16/2020 10/16/2019  Glucose 70 - 99 mg/dL 125(H) 134(H) 113(H)  BUN 8 - 27 mg/dL 13 13 16   Creatinine 0.76 - 1.27 mg/dL 0.85 0.95 0.87  Sodium 134 - 144 mmol/L 140 139 137  Potassium 3.5 - 5.2 mmol/L 4.5 4.5 4.6  Chloride 96 - 106 mmol/L 106 104 102  CO2 20 - 29 mmol/L 27 27 31   Calcium 8.6 - 10.2 mg/dL 9.2 9.2 9.1  Total Protein 6.0 -  8.3 g/dL - 7.3 6.8  Total Bilirubin 0.2 - 1.2 mg/dL - 0.7 0.7  Alkaline Phos 39 - 117 U/L - 50 56  AST 0 - 37 U/L - 21 21  ALT 0 - 53 U/L - 19 17      ASSESSMENT & PLAN:    H. pylori infection Discussed with pharmacy regarding next steps for salvage regimen as he has now failed 3 different courses for his H pylori.  Will plan for rifabutin based triple therapy with Pantoprazole 40mg  BID, Rifabutin 300mg  daily, and Amoxicillin 750mg  TID.  Duration will be 14 days.  Will hold his omeprazole while on pantoprazole.    RTC in 2 months and advised to hold PPI for 2 weeks prior to returning for eradication testing.  Also advised to not eat or drink for 2 hours prior to appointment.       Raynelle Highland for Infectious Disease [facility] 06/11/2021, 2:59 PM  I spent 30 minutes dedicated to the care of this patient on the date of this encounter to include pre-visit review of records, face-to-face time with the patient discussing H pylori, and post-visit ordering of testing.

## 2021-06-11 NOTE — Assessment & Plan Note (Addendum)
Discussed with pharmacy regarding next steps for salvage regimen as he has now failed 3 different courses for his H pylori.  Will plan for rifabutin based triple therapy with Pantoprazole 40mg  BID, Rifabutin 300mg  daily, and Amoxicillin 750mg  TID.  Duration will be 14 days.  Will hold his omeprazole while on pantoprazole.    RTC in 2 months and advised to hold PPI for 2 weeks prior to returning for eradication testing.  Also advised to not eat or drink for 2 hours prior to appointment.

## 2021-06-11 NOTE — Telephone Encounter (Signed)
No he does not need OV first.  We referred him to ID

## 2021-06-11 NOTE — Telephone Encounter (Signed)
Patient called and stated he had been going to an infection specialist.  He is on recall for another colonoscopy for 3/23; however, he wanted to know if he should come in for an OV first to discuss.  Please call patient and advise.  Thank you.

## 2021-06-11 NOTE — Patient Instructions (Addendum)
Thank you for coming to see me today. It was a pleasure seeing you.  To Do: We are going to treat your H pylori with the following regimen: Pantoprazole 40mg  twice daily, Rifabutin 300mg  daily, and Amoxicillin 750mg  three times daily I have sent this prescription to [facility] Once you start the 3 medications prescribed, you can stop taking your Prilosec After the 14 day course of medication that I give you, it will be okay to resume Prilosec Return in about 2 months for H pylori eradication test.  Please hold your Prilosec (or any PPI medication) for 2 weeks prior to your appointment with me.  Also do not eat or drink for 2 hours prior to that appointment  If you have any questions or concerns, please do not hesitate to call the office at [phone].  Take Care,   Jule Ser

## 2021-06-11 NOTE — Telephone Encounter (Signed)
Pt notified as instructed and voiced understanding and pt will contact [facility].

## 2021-06-12 ENCOUNTER — Ambulatory Visit (HOSPITAL_COMMUNITY)
Admission: RE | Admit: 2021-06-12 | Discharge: 2021-06-12 | Disposition: A | Discharge status: Home / Self Care | Payer: Medicare Other | Source: Ambulatory Visit | Attending: Physician Assistant | Admitting: Physician Assistant

## 2021-06-12 ENCOUNTER — Telehealth: Payer: Self-pay

## 2021-06-12 ENCOUNTER — Other Ambulatory Visit (HOSPITAL_COMMUNITY): Payer: Self-pay

## 2021-06-12 VITALS — BP 126/64 | HR 72 | Ht 72.0 in | Wt 203.6 lb

## 2021-06-12 DIAGNOSIS — I2510 Atherosclerotic heart disease of native coronary artery without angina pectoris: Secondary | ICD-10-CM | POA: Diagnosis not present

## 2021-06-12 DIAGNOSIS — I5020 Unspecified systolic (congestive) heart failure: Secondary | ICD-10-CM | POA: Insufficient documentation

## 2021-06-12 DIAGNOSIS — E118 Type 2 diabetes mellitus with unspecified complications: Secondary | ICD-10-CM | POA: Diagnosis not present

## 2021-06-12 DIAGNOSIS — I480 Paroxysmal atrial fibrillation: Secondary | ICD-10-CM | POA: Insufficient documentation

## 2021-06-12 DIAGNOSIS — K922 Gastrointestinal hemorrhage, unspecified: Secondary | ICD-10-CM | POA: Diagnosis not present

## 2021-06-12 DIAGNOSIS — D6869 Other thrombophilia: Secondary | ICD-10-CM | POA: Diagnosis not present

## 2021-06-12 DIAGNOSIS — I110 Hypertensive heart disease with heart failure: Secondary | ICD-10-CM | POA: Diagnosis not present

## 2021-06-12 DIAGNOSIS — Z7901 Long term (current) use of anticoagulants: Secondary | ICD-10-CM | POA: Insufficient documentation

## 2021-06-12 DIAGNOSIS — Z79899 Other long term (current) drug therapy: Secondary | ICD-10-CM | POA: Diagnosis not present

## 2021-06-12 DIAGNOSIS — I4892 Unspecified atrial flutter: Secondary | ICD-10-CM | POA: Insufficient documentation

## 2021-06-12 LAB — CBC
HCT: 36.1 % — ABNORMAL LOW (ref 39.0–52.0)
Hemoglobin: 11.6 g/dL — ABNORMAL LOW (ref 13.0–17.0)
MCH: 26.8 pg (ref 26.0–34.0)
MCHC: 32.1 g/dL (ref 30.0–36.0)
MCV: 83.4 fL (ref 80.0–100.0)
Platelets: 311 10*3/uL (ref 150–400)
RBC: 4.33 MIL/uL (ref 4.22–5.81)
RDW: 19 % — ABNORMAL HIGH (ref 11.5–15.5)
WBC: 5 10*3/uL (ref 4.0–10.5)
nRBC: 0 % (ref 0.0–0.2)

## 2021-06-12 LAB — BASIC METABOLIC PANEL
Anion gap: 10 (ref 5–15)
BUN: 9 mg/dL (ref 8–23)
CO2: 22 mmol/L (ref 22–32)
Calcium: 8.7 mg/dL — ABNORMAL LOW (ref 8.9–10.3)
Chloride: 104 mmol/L (ref 98–111)
Creatinine, Ser: 0.9 mg/dL (ref 0.61–1.24)
GFR, Estimated: >60 mL/min (ref >60)
Glucose, Bld: 189 mg/dL — ABNORMAL HIGH (ref 70–99)
Potassium: 3.8 mmol/L (ref 3.5–5.1)
Sodium: 136 mmol/L (ref 135–145)

## 2021-06-12 NOTE — Telephone Encounter (Signed)
Thank you Donna 

## 2021-06-12 NOTE — Progress Notes (Addendum)
Primary Care Physician: [name], MD Primary Cardiologist: Dr [name] Primary Electrophysiologist: Dr Quentin Ore Referring Physician: Dr Ernst Bowler is a 76 y.o. male with a history of DM, CAD, systolic dysfunction, atrial flutter, atrial fibrillation, GI bleeding who presents for follow up in the [facility]. Patient is on Xarelto for a CHADS2VASC score of 6. He underwent afib and flutter ablation with Dr Quentin Ore on 05/15/21. He reports that he has done well since the procedure stating he does not get as winded with activity. He denies CP, swallowing pain, or groin issues. He is also scheduled for Watchman implant on 07/09/21 with his h/o GI bleeding.   Today, he denies symptoms of palpitations, chest pain, shortness of breath, orthopnea, PND, lower extremity edema, dizziness, presyncope, syncope, snoring, daytime somnolence, bleeding, or neurologic sequela. The patient is tolerating medications without difficulties and is otherwise without complaint today.    Atrial Fibrillation Risk Factors:  he does not have symptoms or diagnosis of sleep apnea. he does not have a history of rheumatic fever.   he has a BMI of Body mass index is 27.61 kg/m.[name] Filed Weights   06/12/21 1053  Weight: 92.4 kg    Family History  Problem Relation Age of Onset   Colon cancer Mother 20   Heart disease Sister        MI, pace maker   Kidney disease Sister        renal failure, nephrectomy- not cancer   Hypertension Sister    Hypertension Sister    Colon polyps Brother    Lung cancer Brother    Diabetes Neg Hx        DM   Depression Neg Hx    Stroke Neg Hx    Alcohol abuse Neg Hx        also no drug abuse    Prostate cancer Neg Hx    Liver cancer Neg Hx    Rectal cancer Neg Hx    Stomach cancer Neg Hx      Atrial Fibrillation Management history:  Previous antiarrhythmic drugs: none Previous cardioversions: none Previous ablations:  05/15/21 CHADS2VASC score: 6 Anticoagulation history: Xarelto    Past Medical History:  Diagnosis Date   Allergy    SEASONAL   Anemia    Arthritis    KNEES   BPH (benign prostatic hyperplasia)    Diverticulosis of colon    Elevated PSA    1.22 on 01-20-2016   Feeling of incomplete bladder emptying    GERD (gastroesophageal reflux disease)    Hiatal hernia    History of adenomatous polyp of colon    2014 tubular adenoma   History of chronic gastritis    History of Helicobacter pylori infection    07/ 2016   History of lower GI bleeding    07/ 2010 and 12/ 2011  diverticular bleed both times   Hyperlipidemia    Hypertension    Lower urinary tract symptoms (LUTS)    Nonischemic cardiomyopathy (HCC)    Plantar fascia syndrome    Strains to urinate    INTERMITTANT   Type 2 diabetes, diet controlled ([place])    ON BORDER LINE,NEVER TOOK MEDICATION.   Wears glasses    Past Surgical History:  Procedure Laterality Date   APPENDECTOMY  teenager   ATRIAL FIBRILLATION ABLATION N/A 05/15/2021   Procedure: ATRIAL FIBRILLATION ABLATION;  Surgeon: Vickie Epley, MD;  Location: [facility];  Service:  Cardiovascular;  Laterality: N/A;   CARDIAC CATHETERIZATION  09-10-2008   dr dalton mclean   mild non-obstructive CAD;  30-40% mRCA, 20%pLAD,  ef 55%   CARDIOVASCULAR STRESS TEST  07/29/2011   Low risk nuclear study w/ no ischemia or infarct/  LVEF 47% and  apical hypokinesis (08-07-2012 cardiac MRI -- EF 49% w/ no hyperenhancement distal septal and apical hypokinesis, mild LAE, mild RVE, mild AR with mild dilated sinus 27mm)   CATARACT EXTRACTION W/ INTRAOCULAR LENS  IMPLANT, BILATERAL  2016   COLONOSCOPY  last one 12-21-2012   CYSTOSCOPY WITH INSERTION OF UROLIFT N/A 06/17/2016   Procedure: CYSTOSCOPY WITH INSERTION OF UROLIFT;  Surgeon: [name], MD;  Location: [place];  Service: Urology;  Laterality: N/A;   ESOPHAGOGASTRODUODENOSCOPY  last one  11-22-2014   TRANSTHORACIC ECHOCARDIOGRAM  09/17/2014   ef 50-55%/  trivial AR, MR and PR/  mild LAE and RAE/  mild TR    Current Outpatient Medications  Medication Sig Dispense Refill   amoxicillin (AMOXIL) 250 MG capsule Take 1 capsule (250 mg total) by mouth 3 (three) times daily for 14 days. 42 capsule 0   ascorbic acid (VITAMIN C) 1000 MG tablet Take 1,000 mg by mouth daily.     atorvastatin (LIPITOR) 20 MG tablet Take 1 tablet (20 mg total) by mouth every morning. 90 tablet 3   calcium carbonate (TUMS - DOSED IN MG ELEMENTAL CALCIUM) 500 MG chewable tablet Chew 1 tablet by mouth as needed for indigestion. Reported on 07/17/2015     CALCIUM-VITAMIN D PO Take 1 tablet by mouth daily.     fish oil-omega-3 fatty acids 1000 MG capsule Take 1 g by mouth daily.     Iron, Ferrous Sulfate, 325 (65 Fe) MG TABS Take 325 mg by mouth every other day.     losartan (COZAAR) 25 MG tablet Take 0.5 tablets (12.5 mg total) by mouth every morning. 90 tablet 3   metoprolol succinate (TOPROL-XL) 50 MG 24 hr tablet Take 1 tablet (50 mg total) by mouth daily. Take with or immediately following a meal. 90 tablet 3   Multiple Vitamin (MULTIVITAMIN) tablet Take 1 tablet by mouth daily.     pantoprazole (PROTONIX) 40 MG tablet Take 1 tablet (40 mg total) by mouth 2 (two) times daily for 14 days. 28 tablet 0   rifabutin (MYCOBUTIN) 150 MG capsule Take 2 capsules (300 mg total) by mouth daily for 14 days. 28 capsule 0   rivaroxaban (XARELTO) 20 MG TABS tablet Take 1 tablet (20 mg total) by mouth daily with supper. 90 tablet 3   tamsulosin (FLOMAX) 0.4 MG CAPS capsule Take 2 capsules (0.8 mg total) by mouth daily. 180 capsule 3   No current facility-administered medications for this encounter.    Allergies  Allergen Reactions   Aspirin Other (See Comments)    History of GI bleed    Social History   Socioeconomic History   Marital status: Married    Spouse name: Not on file   Number of children: 0   Years  of education: Not on file   Highest education level: Not on file  Occupational History   Occupation: truck driver- retired    Fish farm manager: RETIRED  Tobacco Use   Smoking status: Never   Smokeless tobacco: Never  Vaping Use   Vaping Use: Never used  Substance and Sexual Activity   Alcohol use: Not Currently    Comment: rarely drinks beer   Drug use: No  Sexual activity: Not Currently  Other Topics Concern   Not on file  Social History Narrative   Married 2003, no children.    Retired Administrator, gets regular exercise- walking.    Step daughter is MD in [place]   Social Determinants of Health   Financial Resource Strain: Low Risk    Difficulty of Paying Living Expenses: Not very hard  Food Insecurity: Not on file  Transportation Needs: Not on file  Physical Activity: Not on file  Stress: Not on file  Social Connections: Not on file  Intimate Partner Violence: Not on file     ROS- All systems are reviewed and negative except as per the HPI above.  Physical Exam: Vitals:   06/12/21 1053  BP: 126/64  Pulse: 72  Weight: 92.4 kg  Height: 6' (1.829 m)    GEN- The patient is a well appearing elderly male, alert and oriented x 3 today.   Head- normocephalic, atraumatic Eyes-  Sclera clear, conjunctiva pink Ears- hearing intact Oropharynx- clear Neck- supple  Lungs- Clear to ausculation bilaterally, normal work of breathing Heart- Regular rate and rhythm, no murmurs, rubs or gallops  GI- soft, NT, ND, + BS Extremities- no clubbing, cyanosis, or edema MS- no significant deformity or atrophy Skin- no rash or lesion Psych- euthymic mood, full affect Neuro- strength and sensation are intact  Wt Readings from Last 3 Encounters:  06/12/21 92.4 kg  06/11/21 92.1 kg  05/22/21 91.6 kg    EKG today demonstrates  SR Vent. rate 72 BPM PR interval 172 ms QRS duration 90 ms QT/QTcB 388/424 ms  Echo 02/25/21 demonstrated  1. Left ventricular ejection fraction, by  estimation, is 50 to 55%. The  left ventricle has low normal function. The left ventricle has no regional  wall motion abnormalities. Left ventricular diastolic parameters are  consistent with Grade I diastolic dysfunction (impaired relaxation).   2. Right ventricular systolic function is normal. The right ventricular  size is mildly enlarged. There is normal pulmonary artery systolic  pressure.   3. The mitral valve is normal in structure. No evidence of mitral valve  regurgitation. No evidence of mitral stenosis.   4. The aortic valve is normal in structure. There is mild calcification  of the aortic valve. There is mild thickening of the aortic valve. Aortic  valve regurgitation is trivial. Mild aortic valve sclerosis is present,  with no evidence of aortic valve  stenosis.   5. Aortic dilatation noted. There is mild dilatation of the aortic root,  measuring 42 mm. There is borderline dilatation of the ascending aorta,  measuring 39 mm.   6. The inferior vena cava is normal in size with greater than 50%  respiratory variability, suggesting right atrial pressure of 3 mmHg.   Comparison(s): Prior images reviewed side by side. The left ventricular  function has improved. 09/17/19 EF 45-50%.  Epic records are reviewed at length today  CHA2DS2-VASc Score = 6  The patient's score is based upon: CHF History: 1 HTN History: 1 Diabetes History: 1 Stroke History: 0 Vascular Disease History: 1 Age Score: 2 Gender Score: 0      HAS-BLED score 2 Hypertension, uncontrolled No  Abnormal renal and liver function (Dialysis, transplant, Cr >2.26 mg/dL /Cirrhosis or Bilirubin >2x Normal or AST/ALT/AP >3x Normal) No  Stroke No  Bleeding Yes  Labile INR (Unstable/high INR) No  Elderly (>65) Yes  Drugs or alcohol (? 8 drinks/week, anti-plt or NSAID) No    ASSESSMENT AND PLAN:  1. Paroxysmal Atrial Fibrillation/atrial flutter The patient's CHA2DS2-VASc score is 6, indicating a 9.7% annual  risk of stroke.   S/p afib and flutter ablation 05/15/21 Patient appears to be maintaining SR. Continue Xarelto 20 mg daily for now. Plans for Watchman 07/09/21. Continue Toprol 50 mg daily Check bmet/cbc today.  2. Secondary Hypercoagulable State (ICD10:  D68.69) The patient is at significant risk for stroke/thromboembolism based upon his CHA2DS2-VASc Score of 6.  Continue Rivaroxaban (Xarelto).   3. CAD CAC score 576 on CT. No anginal symptoms.  4. HTN Stable, no changes today.  5. Systolic dysfunction  EF improved to 50-55%   Follow up for Watchman implant 07/09/21.    [facility] [address] [place], [place] [address] [phone] 06/12/2021 11:36 AM

## 2021-06-12 NOTE — Telephone Encounter (Signed)
[facility] Patient Advocate Encounter  I was successful in securing patient a $2000.00 grant from [facility] to provide copayment coverage for Rifabutin.  This will make the out of pocket expense $0.00.     [employer] ID: [id number]  I have spoken with the patient..     The billing information is as follows and has been shared with [facility].    RxBin: [id number] PCN: [id number] Member ID: [id number] Group ID: [id number] Dates of Eligibility: 05/12/21 through 05/11/2022  Patient knows to call the office with questions or concerns.  Ileene Patrick, [facility] Patient [facility] Phone: [phone] Fax:  [phone]

## 2021-06-22 ENCOUNTER — Telehealth: Payer: Self-pay

## 2021-06-22 ENCOUNTER — Other Ambulatory Visit (HOSPITAL_COMMUNITY): Payer: Self-pay

## 2021-06-22 NOTE — Telephone Encounter (Signed)
Patient called stating that he did not receive 14 days of of his Amoxicillin. Took last pill this morning. Requested patient follow up with [facility] regarding missing doses. Will call office if he continues to have issues getting medication,. Leatrice Jewels, RMA

## 2021-06-29 ENCOUNTER — Other Ambulatory Visit (HOSPITAL_COMMUNITY): Payer: Self-pay

## 2021-07-02 ENCOUNTER — Other Ambulatory Visit (HOSPITAL_COMMUNITY): Payer: Self-pay

## 2021-07-07 ENCOUNTER — Other Ambulatory Visit
Admission: RE | Admit: 2021-07-07 | Discharge: 2021-07-07 | Disposition: A | Discharge status: Home / Self Care | Payer: Medicare Other | Source: Ambulatory Visit | Attending: Cardiology | Admitting: Cardiology

## 2021-07-07 ENCOUNTER — Other Ambulatory Visit: Payer: Self-pay

## 2021-07-07 DIAGNOSIS — Z01812 Encounter for preprocedural laboratory examination: Secondary | ICD-10-CM | POA: Diagnosis not present

## 2021-07-07 DIAGNOSIS — Z1152 Encounter for screening for COVID-19: Secondary | ICD-10-CM

## 2021-07-07 DIAGNOSIS — Z20822 Contact with and (suspected) exposure to covid-19: Secondary | ICD-10-CM | POA: Insufficient documentation

## 2021-07-07 LAB — SARS CORONAVIRUS 2 (TAT 6-24 HRS): SARS Coronavirus 2: NEGATIVE

## 2021-07-08 ENCOUNTER — Telehealth: Payer: Self-pay

## 2021-07-08 NOTE — Telephone Encounter (Signed)
Confirmed negative Covid test.  ?Confirmed medication instructions. ?Confirmed 0515 arrival for 0730 case tomorrow. ?The patient has no questions or concerns.  ?He was grateful for call. ?

## 2021-07-08 NOTE — Addendum Note (Signed)
Encounter addended by: Oliver Barre, PA on: 07/08/2021 10:42 AM  Actions taken: Clinical Note Signed

## 2021-07-08 NOTE — Anesthesia Preprocedure Evaluation (Addendum)
Anesthesia Evaluation  ?Patient identified by MRN, date of birth, ID band ?Patient awake ? ? ? ?Reviewed: ?Allergy & Precautions, NPO status , Patient's Chart, lab work & pertinent test results ? ?Airway ?Mallampati: II ? ?TM Distance: >3 FB ?Neck ROM: Full ? ? ? Dental ?no notable dental hx. ? ?  ?Pulmonary ?neg pulmonary ROS,  ?  ?Pulmonary exam normal ? ? ? ? ? ? ? Cardiovascular ?hypertension, Pt. on medications and Pt. on home beta blockers ?+ CAD  ?+ dysrhythmias (on Xarelto) Atrial Fibrillation  ?Rhythm:Irregular Rate:Normal ? ? ?  ?Neuro/Psych ?negative neurological ROS ? negative psych ROS  ? GI/Hepatic ?Neg liver ROS, hiatal hernia, GERD  ,  ?Endo/Other  ?diabetes, Type 2 ? Renal/GU ?negative Renal ROS  ?negative genitourinary ?  ?Musculoskeletal ? ?(+) Arthritis , Osteoarthritis,   ? Abdominal ?Normal abdominal exam  (+)   ?Peds ? Hematology ? ?(+) Blood dyscrasia, anemia ,   ?Anesthesia Other Findings ? ? Reproductive/Obstetrics ? ?  ? ? ? ? ? ? ? ? ? ? ? ? ? ?  ?  ? ? ? ? ? ? ? ?Anesthesia Physical ?Anesthesia Plan ? ?ASA: 3 ? ?Anesthesia Plan: General  ? ?Post-op Pain Management:   ? ?Induction: Intravenous ? ?PONV Risk Score and Plan: 2 and Ondansetron and Dexamethasone ? ?Airway Management Planned: Mask and Oral ETT ? ?Additional Equipment: Arterial line ? ?Intra-op Plan:  ? ?Post-operative Plan: Extubation in OR ? ?Informed Consent: I have reviewed the patients History and Physical, chart, labs and discussed the procedure including the risks, benefits and alternatives for the proposed anesthesia with the patient or authorized representative who has indicated his/her understanding and acceptance.  ? ? ? ?Dental advisory given ? ?Plan Discussed with: CRNA ? ?Anesthesia Plan Comments: (Lab Results ?     Component                Value               Date                 ?     WBC                      5.0                 06/12/2021           ?     HGB                       11.6 (L)            06/12/2021           ?     HCT                      36.1 (L)            06/12/2021           ?     MCV                      83.4                06/12/2021           ?     PLT  311                 06/12/2021           ?Lab Results ?     Component                Value               Date                 ?     NA                       136                 06/12/2021           ?     K                        3.8                 06/12/2021           ?     CO2                      22                  06/12/2021           ?     GLUCOSE                  189 (H)             06/12/2021           ?     BUN                      9                   06/12/2021           ?     CREATININE               0.90                06/12/2021           ?     CALCIUM                  8.7 (L)             06/12/2021           ?     EGFR                     91                  04/23/2021           ?     GFRNONAA                 >60                 06/12/2021           ? ?ECHO 10/22: ??1. Left ventricular ejection fraction, by estimation, is 50 to 55%. The  ?left ventricle has low normal function. The left ventricle has no regional  ?wall motion abnormalities. Left ventricular diastolic parameters are  ?consistent with Grade I diastolic  ?  dysfunction (impaired relaxation).  ??2. Right ventricular systolic function is normal. The right ventricular  ?size is mildly enlarged. There is normal pulmonary artery systolic  ?pressure.  ??3. The mitral valve is normal in structure. No evidence of mitral valve  ?regurgitation. No evidence of mitral stenosis.  ??4. The aortic valve is normal in structure. There is mild calcification  ?of the aortic valve. There is mild thickening of the aortic valve. Aortic  ?valve regurgitation is trivial. Mild aortic valve sclerosis is present,  ?with no evidence of aortic valve  ?stenosis.  ??5. Aortic dilatation noted. There is mild dilatation of the aortic root,  ?measuring 42 mm.  There is borderline dilatation of the ascending aorta,  ?measuring 39 mm.  ??6. The inferior vena cava is normal in size with greater than 50%  ?respiratory variability, suggesting right atrial pressure of 3 mmHg. )  ? ? ? ? ? ?Anesthesia Quick Evaluation ? ?

## 2021-07-09 ENCOUNTER — Encounter (HOSPITAL_COMMUNITY): Payer: Self-pay | Admitting: Cardiology

## 2021-07-09 ENCOUNTER — Inpatient Hospital Stay (HOSPITAL_COMMUNITY): Payer: Medicare Other

## 2021-07-09 ENCOUNTER — Encounter (HOSPITAL_COMMUNITY)
Admission: RE | Disposition: A | Discharge status: Home / Self Care | Payer: Self-pay | Source: Home / Self Care | Attending: Cardiology

## 2021-07-09 ENCOUNTER — Inpatient Hospital Stay (HOSPITAL_COMMUNITY)
Admission: RE | Admit: 2021-07-09 | Discharge: 2021-07-10 | DRG: 274 | Disposition: A | Discharge status: Home / Self Care | Payer: Medicare Other | Attending: Cardiology | Admitting: Cardiology

## 2021-07-09 ENCOUNTER — Inpatient Hospital Stay (HOSPITAL_COMMUNITY): Payer: Medicare Other | Admitting: Anesthesiology

## 2021-07-09 ENCOUNTER — Other Ambulatory Visit: Payer: Self-pay

## 2021-07-09 DIAGNOSIS — Z8249 Family history of ischemic heart disease and other diseases of the circulatory system: Secondary | ICD-10-CM | POA: Diagnosis not present

## 2021-07-09 DIAGNOSIS — Z006 Encounter for examination for normal comparison and control in clinical research program: Secondary | ICD-10-CM

## 2021-07-09 DIAGNOSIS — Z9841 Cataract extraction status, right eye: Secondary | ICD-10-CM

## 2021-07-09 DIAGNOSIS — I10 Essential (primary) hypertension: Secondary | ICD-10-CM | POA: Diagnosis not present

## 2021-07-09 DIAGNOSIS — E119 Type 2 diabetes mellitus without complications: Secondary | ICD-10-CM

## 2021-07-09 DIAGNOSIS — Z961 Presence of intraocular lens: Secondary | ICD-10-CM | POA: Diagnosis not present

## 2021-07-09 DIAGNOSIS — I083 Combined rheumatic disorders of mitral, aortic and tricuspid valves: Secondary | ICD-10-CM | POA: Diagnosis not present

## 2021-07-09 DIAGNOSIS — I2510 Atherosclerotic heart disease of native coronary artery without angina pectoris: Secondary | ICD-10-CM | POA: Diagnosis present

## 2021-07-09 DIAGNOSIS — K922 Gastrointestinal hemorrhage, unspecified: Secondary | ICD-10-CM

## 2021-07-09 DIAGNOSIS — Z01818 Encounter for other preprocedural examination: Secondary | ICD-10-CM

## 2021-07-09 DIAGNOSIS — I483 Typical atrial flutter: Secondary | ICD-10-CM | POA: Diagnosis not present

## 2021-07-09 DIAGNOSIS — Z7901 Long term (current) use of anticoagulants: Secondary | ICD-10-CM | POA: Diagnosis not present

## 2021-07-09 DIAGNOSIS — Z9842 Cataract extraction status, left eye: Secondary | ICD-10-CM | POA: Diagnosis not present

## 2021-07-09 DIAGNOSIS — Z95818 Presence of other cardiac implants and grafts: Secondary | ICD-10-CM

## 2021-07-09 DIAGNOSIS — I4891 Unspecified atrial fibrillation: Secondary | ICD-10-CM | POA: Diagnosis present

## 2021-07-09 DIAGNOSIS — K219 Gastro-esophageal reflux disease without esophagitis: Secondary | ICD-10-CM | POA: Diagnosis present

## 2021-07-09 DIAGNOSIS — Z79899 Other long term (current) drug therapy: Secondary | ICD-10-CM

## 2021-07-09 DIAGNOSIS — I480 Paroxysmal atrial fibrillation: Secondary | ICD-10-CM

## 2021-07-09 HISTORY — PX: LEFT ATRIAL APPENDAGE OCCLUSION: EP1229

## 2021-07-09 HISTORY — DX: Presence of other cardiac implants and grafts: Z95.818

## 2021-07-09 HISTORY — PX: TEE WITHOUT CARDIOVERSION: SHX5443

## 2021-07-09 LAB — SURGICAL PCR SCREEN
MRSA, PCR: NEGATIVE
Staphylococcus aureus: NEGATIVE

## 2021-07-09 LAB — TYPE AND SCREEN
ABO/RH(D): B POS
Antibody Screen: NEGATIVE

## 2021-07-09 LAB — GLUCOSE, CAPILLARY
Glucose-Capillary: 128 mg/dL — ABNORMAL HIGH (ref 70–99)
Glucose-Capillary: 124 mg/dL — ABNORMAL HIGH (ref 70–99)
Glucose-Capillary: 214 mg/dL — ABNORMAL HIGH (ref 70–99)

## 2021-07-09 LAB — POCT ACTIVATED CLOTTING TIME: Activated Clotting Time: 366 seconds

## 2021-07-09 SURGERY — LEFT ATRIAL APPENDAGE OCCLUSION
Anesthesia: General

## 2021-07-09 MED ORDER — ASPIRIN EC 81 MG PO TBEC: 81.0000 mg | DELAYED_RELEASE_TABLET | Freq: Every day | ORAL | Status: DC

## 2021-07-09 MED ORDER — SODIUM CHLORIDE 0.9% FLUSH
3.0000 mL | Freq: Two times a day (BID) | INTRAVENOUS | Status: DC
  Administered 2021-07-09 (×2): 3 mL via INTRAVENOUS

## 2021-07-09 MED ORDER — LACTATED RINGERS IV SOLN: INTRAVENOUS | Status: DC | PRN

## 2021-07-09 MED ORDER — ONDANSETRON HCL 4 MG/2ML IJ SOLN
INTRAMUSCULAR | Status: DC | PRN
  Administered 2021-07-09: 4 mg via INTRAVENOUS

## 2021-07-09 MED ORDER — ATORVASTATIN CALCIUM 10 MG PO TABS
20.0000 mg | ORAL_TABLET | Freq: Every morning | ORAL | Status: DC
  Administered 2021-07-09 – 2021-07-10 (×2): 20 mg via ORAL
  Filled 2021-07-09 (×2): qty 2

## 2021-07-09 MED ORDER — HEPARIN (PORCINE) IN NACL 2000-0.9 UNIT/L-% IV SOLN
INTRAVENOUS | Status: AC
  Filled 2021-07-09: qty 1000

## 2021-07-09 MED ORDER — SODIUM CHLORIDE 0.9 % IV SOLN: 250.0000 mL | INTRAVENOUS | Status: DC | PRN

## 2021-07-09 MED ORDER — DEXAMETHASONE SODIUM PHOSPHATE 10 MG/ML IJ SOLN
INTRAMUSCULAR | Status: DC | PRN
  Administered 2021-07-09: 5 mg via INTRAVENOUS

## 2021-07-09 MED ORDER — ROCURONIUM BROMIDE 10 MG/ML (PF) SYRINGE
PREFILLED_SYRINGE | INTRAVENOUS | Status: DC | PRN
  Administered 2021-07-09: 60 mg via INTRAVENOUS

## 2021-07-09 MED ORDER — HEPARIN (PORCINE) IN NACL 1000-0.9 UT/500ML-% IV SOLN
INTRAVENOUS | Status: AC
  Filled 2021-07-09: qty 500

## 2021-07-09 MED ORDER — METOPROLOL SUCCINATE ER 50 MG PO TB24
50.0000 mg | ORAL_TABLET | Freq: Every day | ORAL | Status: DC
  Administered 2021-07-10: 50 mg via ORAL
  Filled 2021-07-09: qty 1

## 2021-07-09 MED ORDER — SODIUM CHLORIDE 0.9 % IV SOLN: INTRAVENOUS | Status: DC

## 2021-07-09 MED ORDER — LACTATED RINGERS IV SOLN: INTRAVENOUS | Status: DC

## 2021-07-09 MED ORDER — LIDOCAINE 2% (20 MG/ML) 5 ML SYRINGE
INTRAMUSCULAR | Status: DC | PRN
  Administered 2021-07-09: 60 mg via INTRAVENOUS

## 2021-07-09 MED ORDER — ONDANSETRON HCL 4 MG/2ML IJ SOLN: 4.0000 mg | Freq: Four times a day (QID) | INTRAMUSCULAR | Status: DC | PRN

## 2021-07-09 MED ORDER — PROTAMINE SULFATE 10 MG/ML IV SOLN
INTRAVENOUS | Status: DC | PRN
  Administered 2021-07-09 (×3): 10 mg via INTRAVENOUS

## 2021-07-09 MED ORDER — HEPARIN (PORCINE) IN NACL 1000-0.9 UT/500ML-% IV SOLN
INTRAVENOUS | Status: DC | PRN
  Administered 2021-07-09: 500 mL

## 2021-07-09 MED ORDER — SODIUM CHLORIDE 0.9% FLUSH
3.0000 mL | INTRAVENOUS | Status: DC | PRN
Start: 2021-07-09 — End: 2021-07-10

## 2021-07-09 MED ORDER — PHENYLEPHRINE HCL-NACL 20-0.9 MG/250ML-% IV SOLN
INTRAVENOUS | Status: DC | PRN
  Administered 2021-07-09: 25 ug/min via INTRAVENOUS

## 2021-07-09 MED ORDER — INSULIN ASPART 100 UNIT/ML IJ SOLN
0.0000 [IU] | INTRAMUSCULAR | Status: DC | PRN
  Filled 2021-07-09: qty 0.07

## 2021-07-09 MED ORDER — RIVAROXABAN 20 MG PO TABS
20.0000 mg | ORAL_TABLET | Freq: Every day | ORAL | Status: DC
  Administered 2021-07-09: 20 mg via ORAL
  Filled 2021-07-09: qty 1

## 2021-07-09 MED ORDER — FENTANYL CITRATE (PF) 100 MCG/2ML IJ SOLN
INTRAMUSCULAR | Status: DC | PRN
  Administered 2021-07-09: 100 ug via INTRAVENOUS

## 2021-07-09 MED ORDER — LACTATED RINGERS IV SOLN
INTRAVENOUS | Status: DC | PRN
Start: 2021-07-09 — End: 2021-07-09

## 2021-07-09 MED ORDER — HEPARIN (PORCINE) IN NACL 2000-0.9 UNIT/L-% IV SOLN
INTRAVENOUS | Status: DC | PRN
  Administered 2021-07-09: 1000 mL

## 2021-07-09 MED ORDER — PROPOFOL 10 MG/ML IV BOLUS
INTRAVENOUS | Status: DC | PRN
Start: 2021-07-09 — End: 2021-07-09
  Administered 2021-07-09: 140 mg via INTRAVENOUS

## 2021-07-09 MED ORDER — IOHEXOL 350 MG/ML SOLN
INTRAVENOUS | Status: DC | PRN
  Administered 2021-07-09 (×2): 10 mL via INTRAVENOUS

## 2021-07-09 MED ORDER — HEPARIN SODIUM (PORCINE) 1000 UNIT/ML IJ SOLN
INTRAMUSCULAR | Status: DC | PRN
  Administered 2021-07-09: 13000 [IU] via INTRAVENOUS

## 2021-07-09 MED ORDER — SUGAMMADEX SODIUM 200 MG/2ML IV SOLN
INTRAVENOUS | Status: DC | PRN
  Administered 2021-07-09: 200 mg via INTRAVENOUS

## 2021-07-09 MED ORDER — CHLORHEXIDINE GLUCONATE 0.12 % MT SOLN
15.0000 mL | Freq: Once | OROMUCOSAL | Status: AC
  Administered 2021-07-09: 15 mL via OROMUCOSAL
  Filled 2021-07-09: qty 15

## 2021-07-09 MED ORDER — CEFAZOLIN SODIUM-DEXTROSE 2-4 GM/100ML-% IV SOLN
2.0000 g | INTRAVENOUS | Status: AC
  Administered 2021-07-09: 2 g via INTRAVENOUS
  Filled 2021-07-09: qty 100

## 2021-07-09 MED ORDER — LOSARTAN POTASSIUM 25 MG PO TABS
12.5000 mg | ORAL_TABLET | Freq: Every morning | ORAL | Status: DC
  Administered 2021-07-09 – 2021-07-10 (×2): 12.5 mg via ORAL
  Filled 2021-07-09 (×2): qty 1

## 2021-07-09 MED ORDER — ACETAMINOPHEN 325 MG PO TABS: 650.0000 mg | ORAL_TABLET | ORAL | Status: DC | PRN

## 2021-07-09 SURGICAL SUPPLY — 20 items
BAG SNAP BAND KOVER 36X36 (MISCELLANEOUS) ×1 IMPLANT
BLANKET WARM UNDERBOD FULL ACC (MISCELLANEOUS) ×2 IMPLANT
CATH DIAG 6FR PIGTAIL ANGLED (CATHETERS) ×1 IMPLANT
CLOSURE PERCLOSE PROSTYLE (VASCULAR PRODUCTS) ×2 IMPLANT
DEVICE WATCHMAN FLX PROC (KITS) IMPLANT
DILATOR VESSEL 38 20CM 11FR (INTRODUCER) ×1 IMPLANT
DILATOR VESSEL 38 20CM 16FR (INTRODUCER) ×1 IMPLANT
KIT HEART LEFT (KITS) ×2 IMPLANT
KIT SHEA VERSACROSS LAAC CONNE (KITS) ×1 IMPLANT
PACK CARDIAC CATHETERIZATION (CUSTOM PROCEDURE TRAY) ×2 IMPLANT
PAD DEFIB RADIO PHYSIO CONN (PAD) ×2 IMPLANT
SHEATH PERFORMER 16FR 30 (SHEATH) ×1 IMPLANT
SHEATH PINNACLE 8F 10CM (SHEATH) ×1 IMPLANT
SHEATH PROBE COVER 6X72 (BAG) ×2 IMPLANT
TRANSDUCER W/STOPCOCK (MISCELLANEOUS) ×2 IMPLANT
TUBING CIL FLEX 10 FLL-RA (TUBING) ×2 IMPLANT
WATCHMAN FLX 31 (Prosthesis & Implant Heart) ×1 IMPLANT
WATCHMAN FLX PROCEDURE DEVICE (KITS) ×2 IMPLANT
WATCHMAN PROCED TRUSEAL ACCESS (SHEATH) ×1 IMPLANT
WATCHMAN TRUSEAL DOUBLE CURVE (SHEATH) ×1 IMPLANT

## 2021-07-09 NOTE — TOC Initial Note (Signed)
Transition of Care (TOC) - Initial/Assessment Note  ? ? ?Patient Details  ?Name: Paul Fry ?MRN: [id number] ?Date of Birth: [birth year] ? ?Transition of Care (TOC) CM/SW Contact:    ?Angelita Ingles, RN ?Phone Number:[phone] ? ?07/09/2021, 3:07 PM ? ?Clinical Narrative:                 ? ?Transition of Care (TOC) Screening Note ? ? ?Patient Details  ?Name: Paul Fry ?Date of Birth: 04-Feb-1946 ? ? ?Transition of Care (TOC) CM/SW Contact:    ?Angelita Ingles, RN ?Phone Number: ?07/09/2021, 3:07 PM ? ? ? ?Transition of Care Department [facility]) has reviewed patient and no TOC needs have been identified at this time. We will continue to monitor patient advancement through interdisciplinary progression rounds. If new patient transition needs arise, please place a TOC consult. ? ? ? ?  ?  ? ? ?Patient Goals and CMS Choice ?  ?  ?  ? ?Expected Discharge Plan and Services ?  ?  ?  ?  ?  ?                ?  ?  ?  ?  ?  ?  ?  ?  ?  ?  ? ?Prior Living Arrangements/Services ?  ?  ?  ?       ?  ?  ?  ?  ? ?Activities of Daily Living ?Home Assistive Devices/Equipment: Eyeglasses, Blood pressure cuff, Scales ?ADL Screening (condition at time of admission) ?Patient's cognitive ability adequate to safely complete daily activities?: Yes ?Is the patient deaf or have difficulty hearing?: No ?Does the patient have difficulty seeing, even when wearing glasses/contacts?: No ?Does the patient have difficulty concentrating, remembering, or making decisions?: No ?Patient able to express need for assistance with ADLs?: Yes ?Does the patient have difficulty dressing or bathing?: No ?Independently performs ADLs?: Yes (appropriate for developmental age) ?Does the patient have difficulty walking or climbing stairs?: Yes ?Weakness of Legs: None ?Weakness of Arms/Hands: None ? ?Permission Sought/Granted ?  ?  ?   ?   ?   ?   ? ?Emotional Assessment ?  ?  ?  ?  ?  ?  ? ?Admission diagnosis:  Atrial fibrillation ([place]) [I48.91] ?Patient Active  Problem List  ? Diagnosis Date Noted  ? 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 Secondary hypercoagulable state ([place]) 06/12/2021  ? H. pylori infection 03/27/2021  ? Atrial fibrillation ([place]) 12/04/2020  ? Stool incontinence 04/23/2020  ? Caregiver stress 10/24/2019  ? Pelvic floor dysfunction 10/24/2019  ? Skin tag 10/18/2018  ? Healthcare maintenance 10/15/2018  ? Plantar fasciitis 10/15/2018  ? Diabetes mellitus without complication ([place]) 40/98/1191  ? BPH (benign prostatic hyperplasia) 12/23/2016  ? Lower GI bleed 12/22/2016  ? Advance care planning 07/16/2014  ? Hyperglycemia 07/16/2014  ? Skin lesion 02/27/2014  ? HTN (hypertension) 07/09/2013  ? Paresthesia 12/25/2012  ? NICM (nonischemic cardiomyopathy) ([place]) 09/26/2012  ? ED (erectile dysfunction) 05/26/2012  ? DOE (dyspnea on exertion) 06/18/2011  ? Medicare annual wellness visit, subsequent 05/26/2011  ? Hypercholesteremia 03/03/2011  ? CAD in native artery 10/14/2008  ? Gastroesophageal reflux disease 10/14/2008  ? DIVERTICULOSIS OF COLON 04/03/2008  ? ?PCP:  [name], MD ?Pharmacy:   ?[place] [place] ?[address] ?[place] [address] ?Phone: [phone] Fax: [phone] ? ?[facility] [address], [place]  [address] ?[place] ?[place] [address] ?Phone: [phone] Fax: [phone] ? ?[facility] ?[address] ?[place] [address] ?Phone: [phone] Fax: [phone] ? ? ? ? ?Social Determinants of Health (SDOH) Interventions ?  ? ?Readmission Risk Interventions ?No flowsheet data found. ? ? ?

## 2021-07-09 NOTE — Plan of Care (Signed)

## 2021-07-09 NOTE — Discharge Summary (Signed)
[facility]    Patient ID: Paul Fry,  MRN: [id number], DOB/AGE: 06-28-1945 76 y.o.  Admit date: 07/09/2021 Discharge date: 07/10/2021  Primary Care Physician: [name], MD  Primary Cardiologist: Truitt Merle, NP (Inactive)  Electrophysiologist: Vickie Epley, MD  Procedures This Admission:   CONCLUSIONS:  1.Successful implantation of a WATCHMAN left atrial appendage occlusive device    2. TEE demonstrating no LAA thrombus 3. No early apparent complications.    Post Implant Anticoagulation Strategy: Continue xarelto 76m by mouth daily for the 45 days after implant. If 45-day TEE criteria are met, transition to plavix 773mby mouth daily to complete 6 months of post-implant therapy.     Brief HPI: Paul VERBRUGGEs a 89.o. male with a history of DM2, CAD, systolic dysfunction, atrial flutter/fibrillation, and GI bleeding secondary to anticoagulation.   Mr. Paul Stallsnderwent atrial fibrillation/flutter ablation with Dr. LaQuentin Fry/6/23 and has done well since that time. He was seen by the [facility] 06/12/21 for follow up and reported symptoms improvement. Plan at that time was to proceed with Watchman implant 07/09/21.   Pre procedure CT performed prior to ablation that showed left atrial appendage as a large chicken wing morphology without thrombus. A 31 mm watchman FLX device is recommended based on the above landing zone measurements (24.7 mm maximum diameter; 20% compression) with an inferior posterior IAS puncture site is recommended. CAC score of 576.   Hospital Course:  The patient was admitted and underwent Left Atrial Appendage Occlusive Device placement with Watchman FLX 3122mith Dr. LamQuentin OreHe was monitored on telemetry overnight which demonstrated NSR. Groin site without complication on the day of discharge. Post procedure site care and restrictions were reviewed with the patient.  Follow up appointments with [facility] APP have been  made for approximately one month after implant. A repeat TEE will be performed at approximately 45 days post procedure to ensure proper seal of the device and to ensure no device thrombus.    Anticoagulation plan: Post Implant Anticoagulation Strategy: Continue Xarelto 69m50m mouth daily for the 45 days after implant. If 45-day TEE criteria are met, transition to plavix 75mg34mmouth daily to complete 6 months of post-implant therapy.   Physical Exam: Vitals:   07/10/21 0300 07/10/21 0319 07/10/21 0604 07/10/21 0713  BP:  (!) 139/49  (!) 158/77  Pulse: 65 (!) 59  67  Resp: 17 13  16   Temp:  98 F (36.7 C)  98.7 F (37.1 C)  TempSrc:  Oral Oral Oral  SpO2: 95% 94%  98%  Weight:   91 kg   Height:       General: Well developed, well nourished, NAD Lungs:Clear to ausculation bilaterally. No wheezes, rales, or rhonchi. Breathing is unlabored. Cardiovascular: RRR with S1 S2. No murmurs Extremities: No edema. No clubbing or cyanosis. DP/PT pulses 2+ bilaterally Neuro: Alert and oriented. No focal deficits. No facial asymmetry. MAE spontaneously. Psych: Responds to questions appropriately with normal affect.    Labs:   Lab Results  Component Value Date   WBC 5.0 06/12/2021   HGB 11.6 (L) 06/12/2021   HCT 36.1 (L) 06/12/2021   MCV 83.4 06/12/2021   PLT 311 06/12/2021    Recent Labs  Lab 07/10/21 0048  NA 132*  K 4.0  CL 101  CO2 23  BUN 11  CREATININE 0.86  CALCIUM 8.8*  GLUCOSE 124*    Discharge Medications:  Allergies as of 07/10/2021  Reactions   Aspirin Other (See Comments)   History of GI bleed        Medication List     TAKE these medications    amoxicillin 500 MG capsule Commonly known as: AMOXIL Take 4 capsules (2,000 mg total) by mouth as directed. Take 4 tablets 1 hour prior to dental work, including cleanings.   ascorbic acid 1000 MG tablet Commonly known as: VITAMIN C Take 1,000 mg by mouth daily.   atorvastatin 20 MG tablet Commonly  known as: LIPITOR Take 1 tablet (20 mg total) by mouth every morning.   calcium carbonate 500 MG chewable tablet Commonly known as: TUMS - dosed in mg elemental calcium Chew 1 tablet by mouth as needed for indigestion. Reported on 07/17/2015   CALCIUM-VITAMIN D PO Take 1 tablet by mouth daily.   fish oil-omega-3 fatty acids 1000 MG capsule Take 1 g by mouth daily.   Iron (Ferrous Sulfate) 325 (65 Fe) MG Tabs Take 325 mg by mouth every other day.   losartan 25 MG tablet Commonly known as: COZAAR Take 0.5 tablets (12.5 mg total) by mouth every morning.   metoprolol succinate 50 MG 24 hr tablet Commonly known as: TOPROL-XL Take 1 tablet (50 mg total) by mouth daily. Take with or immediately following a meal.   multivitamin tablet Take 1 tablet by mouth daily.   rivaroxaban 20 MG Tabs tablet Commonly known as: XARELTO Take 1 tablet (20 mg total) by mouth daily with supper.   tamsulosin 0.4 MG Caps capsule Commonly known as: FLOMAX Take 2 capsules (0.8 mg total) by mouth daily.        Disposition:  Home  Discharge Instructions     Call MD for:  difficulty breathing, headache or visual disturbances   Complete by: As directed    Call MD for:  extreme fatigue   Complete by: As directed    Call MD for:  hives   Complete by: As directed    Call MD for:  persistant dizziness or light-headedness   Complete by: As directed    Call MD for:  persistant nausea and vomiting   Complete by: As directed    Call MD for:  redness, tenderness, or signs of infection (pain, swelling, redness, odor or green/yellow discharge around incision site)   Complete by: As directed    Call MD for:  severe uncontrolled pain   Complete by: As directed    Call MD for:  temperature >100.4   Complete by: As directed    Diet - low sodium heart healthy   Complete by: As directed    Discharge instructions   Complete by: As directed    WATCHMAN Procedure, Care After  Procedure MD: Dr.  Benson [place] Clinical Coordinator: Lenice Llamas, RN  This sheet gives you information about how to care for yourself after your procedure. Your health care provider may also give you more specific instructions. If you have problems or questions, contact your health care provider.  What can I expect after the procedure? After the procedure, it is common to have: Bruising around your puncture site. Tenderness around your puncture site. Tiredness (fatigue).  Medication instructions It is very important to continue to take your blood thinner as directed by your doctor after the Watchman procedure. Call your procedure doctor's office with question or concerns. If you are on Coumadin (warfarin), you will have your INR checked the week after your procedure, with a goal INR of 2.0 - 3.0. Please follow  your medication instructions on your discharge summary. Only take the medications listed on your discharge paperwork.  Follow up You will be seen in 1 month after your procedure in the [facility] [place] will have another TEE (Transesophageal Echocardiogram) approximately 6 weeks after your procedure mark to check your device You will follow up the MD/APP who performed your procedure 6 months after your procedure The Watchman Clinical Coordinator will check in with you from time to time, including 1 and 2 years after your procedure.    Follow these instructions at home: Puncture site care  Follow instructions from your health care provider about how to take care of your puncture site. Make sure you: If present, leave stitches (sutures), skin glue, or adhesive strips in place.  If a large square bandage is present, this may be removed 24 hours after surgery.  Check your puncture site every day for signs of infection. Check for: Redness, swelling, or pain. Fluid or blood. If your puncture site starts to bleed, lie down on your back, apply firm pressure to the area, and contact your  health care provider. Warmth. Pus or a bad smell. Driving Do not drive yourself home if you received sedation Do not drive for at least 4 days after your procedure or however long your health care provider recommends. (Do not resume driving if you have previously been instructed not to drive for other health reasons.) Do not spend greater than 1 hour at a time in a car for the first 3 days. Stop and take a break with a 5 minute walk at least every hour.  Do not drive or use heavy machinery while taking prescription pain medicine.  Activity Avoid activities that take a lot of effort, including exercise, for at least 7 days after your procedure. For the first 3 days, avoid sitting for longer than one hour at a time.  Avoid alcoholic beverages, signing paperwork, or participating in legal proceedings for 24 hours after receiving sedation Do not lift anything that is heavier than 10 lb (4.5 kg) for one week.  No sexual activity for 1 week.  Return to your normal activities as told by your health care provider. Ask your health care provider what activities are safe for you. General instructions Take over-the-counter and prescription medicines only as told by your health care provider. Do not use any products that contain nicotine or tobacco, such as cigarettes and e-cigarettes. If you need help quitting, ask your health care provider. You may shower after 24 hours, but Do not take baths, swim, or use a hot tub for 1 week.  Do not drink alcohol for 24 hours after your procedure. Keep all follow-up visits as told by your health care provider. This is important. Dental Work: You will require antibiotics prior to any dental work, including cleanings, for 6 months after your Watchman implantation to help protect you from infection. After 6 months, antibiotics are no longer required. Contact a health care provider if: You have redness, mild swelling, or pain around your puncture site. You have soreness  in your throat or at your puncture site that does not improve after several days You have fluid or blood coming from your puncture site that stops after applying firm pressure to the area. Your puncture site feels warm to the touch. You have pus or a bad smell coming from your puncture site. You have a fever. You have chest pain or discomfort that spreads to your neck, jaw, or arm. You  are sweating a lot. You feel nauseous. You have a fast or irregular heartbeat. You have shortness of breath. You are dizzy or light-headed and feel the need to lie down. You have pain or numbness in the arm or leg closest to your puncture site. Get help right away if: Your puncture site suddenly swells. Your puncture site is bleeding and the bleeding does not stop after applying firm pressure to the area. These symptoms may represent a serious problem that is an emergency. Do not wait to see if the symptoms will go away. Get medical help right away. Call your local emergency services (911 in the [place] Do not drive yourself to the hospital. Summary After the procedure, it is normal to have bruising and tenderness at the puncture site in your groin, neck, or forearm. Check your puncture site every day for signs of infection. Get help right away if your puncture site is bleeding and the bleeding does not stop after applying firm pressure to the area. This is a medical emergency.  This information is not intended to replace advice given to you by your health care provider. Make sure you discuss any questions you have with your health care provider.   Increase activity slowly   Complete by: As directed        Duration of Discharge Encounter: Greater than 30 minutes including physician time.  Signed, Kathyrn Drown, NP  07/10/2021 8:50 AM

## 2021-07-09 NOTE — Anesthesia Procedure Notes (Signed)
Procedure Name: Intubation ?Date/Time: 07/09/2021 7:41 AM ?Performed by: Harden Mo, CRNA ?Pre-anesthesia Checklist: Patient identified, Emergency Drugs available, Suction available and Patient being monitored ?Patient Re-evaluated:Patient Re-evaluated prior to induction ?Oxygen Delivery Method: Circle System Utilized ?Preoxygenation: Pre-oxygenation with 100% oxygen ?Induction Type: IV induction ?Ventilation: Mask ventilation without difficulty ?Laryngoscope Size: Glidescope and 4 ?Grade View: Grade I ?Tube type: Oral ?Tube size: 7.5 mm ?Number of attempts: 1 ?Airway Equipment and Method: Stylet and Oral airway ?Placement Confirmation: ETT inserted through vocal cords under direct vision, positive ETCO2 and breath sounds checked- equal and bilateral ?Secured at: 23 cm ?Tube secured with: Tape ?Dental Injury: Teeth and Oropharynx as per pre-operative assessment  ? ? ? ? ?

## 2021-07-09 NOTE — Transfer of Care (Signed)
Immediate Anesthesia Transfer of Care Note ? ?Patient: Paul Fry ? ?Procedure(s) Performed: LEFT ATRIAL APPENDAGE OCCLUSION ?TRANSESOPHAGEAL ECHOCARDIOGRAM (TEE) ? ?Patient Location: Cath Lab ? ?Anesthesia Type:General ? ?Level of Consciousness: awake, alert  and oriented ? ?Airway & Oxygen Therapy: Patient Spontanous Breathing and Patient connected to nasal cannula oxygen ? ?Post-op Assessment: Report given to RN, Post -op Vital signs reviewed and stable and Patient moving all extremities X 4 ? ?Post vital signs: Reviewed and stable ? ?Last Vitals:  ?Vitals Value Taken Time  ?BP 161/70 07/09/21 0857  ?Temp    ?Pulse 69 07/09/21 0901  ?Resp 15 07/09/21 0901  ?SpO2 99 % 07/09/21 0901  ?Vitals shown include unvalidated device data. ? ?Last Pain:  ?Vitals:  ? 07/09/21 0615  ?TempSrc:   ?PainSc: 0-No pain  ?   ? ?  ? ?Complications: There were no known notable events for this encounter. ?

## 2021-07-09 NOTE — Progress Notes (Signed)
?  [facility]   ? ?Patient doing well s/p Watchman procedure. He is hemodynamically stable. Groin site is stable. He will be monitored overnight with plans for early discharge in the morning.  ? ?Kathyrn Drown NP-C ?[facility] Team  ?Pager: [phone] ?Phone: [phone] ?Office: [phone] ? ?

## 2021-07-09 NOTE — Anesthesia Postprocedure Evaluation (Signed)
Anesthesia Post Note ? ?Patient: Paul Fry ? ?Procedure(s) Performed: LEFT ATRIAL APPENDAGE OCCLUSION ?TRANSESOPHAGEAL ECHOCARDIOGRAM (TEE) ? ?  ? ?Patient location during evaluation: PACU ?Anesthesia Type: General ?Level of consciousness: awake and alert ?Pain management: pain level controlled ?Vital Signs Assessment: post-procedure vital signs reviewed and stable ?Respiratory status: spontaneous breathing, nonlabored ventilation, respiratory function stable and patient connected to nasal cannula oxygen ?Cardiovascular status: blood pressure returned to baseline and stable ?Postop Assessment: no apparent nausea or vomiting ?Anesthetic complications: no ? ? ?There were no known notable events for this encounter. ? ?Last Vitals:  ?Vitals:  ? 07/09/21 0951 07/09/21 1100  ?BP: (!) 147/79 117/62  ?Pulse:  70  ?Resp: 10 16  ?Temp: 36.6 ?C 36.6 ?C  ?SpO2: 96% 99%  ?  ?Last Pain:  ?Vitals:  ? 07/09/21 1100  ?TempSrc: Oral  ?PainSc:   ? ? ?  ?  ?  ?  ?  ?  ? ?March Rummage Lyna Laningham ? ? ? ? ?

## 2021-07-09 NOTE — H&P (Signed)
Electrophysiology Office Follow up Visit Note:     Date:  02/23/2021    ID:  EMMA SCHUPP, DOB [birth year], MRN [id number]   PCP:  Paul Ghent, MD           [facility] Cardiologist:  Truitt Merle, NP (Inactive)  [facility] Electrophysiologist:  Vickie Epley, MD      Interval History:     Paul Fry is a 76 y.o. male who presents for a follow up visit. They were last seen in clinic January 06, 2021.  That visit was in the setting of a recent diagnosis of atrial fibrillation by Dr. [name].  He also has a mildly reduced left ventricular function with an ejection fraction of 45%.  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  We ordered a ZIO monitor at our last appointment to help better understand the burden of atrial fibrillation.   Today he presents for follow-up.  His daughter, Paul Fry, is on the phone during our visit.  We discussed the results of his monitor today.  He continues to feel intermittent shortness of breath and fatigue.     Objective          Past Medical History:  Diagnosis Date   Allergy      SEASONAL   Anemia     Arthritis      KNEES   BPH (benign prostatic hyperplasia)     Diverticulosis of colon     Elevated PSA      1.22 on 01-20-2016   Feeling of incomplete bladder emptying     GERD (gastroesophageal reflux disease)     Hiatal hernia     History of adenomatous polyp of colon      2014 tubular adenoma   History of chronic gastritis     History of Helicobacter pylori infection      07/ 2016   History of lower GI bleeding      07/ 2010 and 12/ 2011  diverticular bleed both times   Hyperlipidemia     Hypertension     Lower urinary tract symptoms (LUTS)     Nonischemic cardiomyopathy (HCC)     Plantar fascia syndrome     Strains to urinate      INTERMITTANT   Type 2 diabetes, diet controlled (Paul Fry)      ON BORDER LINE,NEVER TOOK MEDICATION.   Wears glasses             Past Surgical History:  Procedure  Laterality Date   APPENDECTOMY   teenager   CARDIAC CATHETERIZATION   09-10-2008   dr dalton mclean    mild non-obstructive CAD;  30-40% mRCA, 20%pLAD,  ef 55%   CARDIOVASCULAR STRESS TEST   07/29/2011    Low risk nuclear study w/ no ischemia or infarct/  LVEF 47% and  apical hypokinesis (08-07-2012 cardiac MRI -- EF 49% w/ no hyperenhancement distal septal and apical hypokinesis, mild LAE, mild RVE, mild AR with mild dilated sinus 58mm)   CATARACT EXTRACTION W/ INTRAOCULAR LENS  IMPLANT, BILATERAL   2016   COLONOSCOPY   last one 12-21-2012   CYSTOSCOPY WITH INSERTION OF UROLIFT N/A 06/17/2016    Procedure: CYSTOSCOPY WITH INSERTION OF UROLIFT;  Surgeon: [name], MD;  Location: [place];  Service: Urology;  Laterality: N/A;   ESOPHAGOGASTRODUODENOSCOPY   last one 11-22-2014   TRANSTHORACIC ECHOCARDIOGRAM   09/17/2014    ef 50-55%/  trivial AR, MR  and PR/  mild LAE and RAE/  mild TR      Current Medications: Active Medications  No outpatient medications have been marked as taking for the 02/23/21 encounter (Office Visit) with Vickie Epley, MD.       Current Facility-Administered Medications for the 02/23/21 encounter (Office Visit) with Vickie Epley, MD  Medication   0.9 %  sodium chloride infusion        Allergies:   Aspirin    Social History         Socioeconomic History   Marital status: Married      Spouse name: Not on file   Number of children: 0   Years of education: Not on file   Highest education level: Not on file  Occupational History   Occupation: truck driver- retired      Fish farm manager: RETIRED  Tobacco Use   Smoking status: Never   Smokeless tobacco: Never  Vaping Use   Vaping Use: Never used  Substance and Sexual Activity   Alcohol use: Yes      Comment: rarely drinks beer   Drug use: No   Sexual activity: Not Currently  Other Topics Concern   Not on file  Social History Narrative    Married 2003, no children.      Retired Administrator, gets regular exercise- walking.     Step daughter is MD in [place]    Social Determinants of Health       Financial Resource Strain: Low Risk    Difficulty of Paying Living Expenses: Not very hard  Food Insecurity: Not on file  Transportation Needs: Not on file  Physical Activity: Not on file  Stress: Not on file  Social Connections: Not on file      Family History: The patient's family history includes Colon cancer (age of onset: 1) in his mother; Colon polyps in his brother; Heart disease in his sister; Hypertension in his sister and sister; Kidney disease in his sister; Lung cancer in his brother. There is no history of Diabetes, Depression, Stroke, Alcohol abuse, Prostate cancer, Liver cancer, Rectal cancer, or Stomach cancer.   ROS:   Please see the history of present illness.    All other systems reviewed and are negative.   EKGs/Labs/Other Studies Reviewed:     The following studies were reviewed today:   January 26, 2021 ZIO monitor personally reviewed HR 33 - 191bpm, average 62bpm. 1% burden of AF/AFL with longest episode lasting 28 minutes. Day to day variation in AF burden (<1% - 4%) Occasional supraventricular ectopy, 3.2%. Rare ventricular ectopy, < 1%.     EKG:  The ekg ordered today demonstrates sinus rhythm.   Recent Labs: 10/16/2020: ALT 19; BUN 13; Creatinine, Ser 0.95; Potassium 4.5; Sodium 139 01/13/2021: Hemoglobin 11.9; Platelets 320.0  Recent Lipid Panel Labs (Brief)          Component Value Date/Time    CHOL 129 10/16/2020 0904    TRIG 115.0 10/16/2020 0904    HDL 30.40 (L) 10/16/2020 0904    CHOLHDL 4 10/16/2020 0904    VLDL 23.0 10/16/2020 0904    LDLCALC 76 10/16/2020 0904        Physical Exam:     VS:  BP 124/70    Pulse (!) 57    Ht 6' (1.829 m)    Wt 203 lb (92.1 kg)    BMI 27.53 kg/m         Wt Readings from Last 3 Encounters:  02/23/21 203 lb (92.1 kg)  01/06/21 199 lb (90.3 kg)  01/05/21 199 lb (90.3  kg)      GEN:  Well nourished, well developed in no acute distress HEENT: Normal NECK: No JVD; No carotid bruits LYMPHATICS: No lymphadenopathy CARDIAC: RRR, no murmurs, rubs, gallops RESPIRATORY:  Clear to auscultation without rales, wheezing or rhonchi  ABDOMEN: Soft, non-tender, non-distended MUSCULOSKELETAL:  No edema; No deformity  SKIN: Warm and dry NEUROLOGIC:  Alert and oriented x 3 PSYCHIATRIC:  Normal affect            Assessment     ASSESSMENT:     1. Paroxysmal atrial fibrillation (HCC)   2. Typical atrial flutter ([place])   3. Primary hypertension   4. Gastrointestinal hemorrhage, unspecified gastrointestinal hemorrhage type     PLAN:     In order of problems listed above:   1. Paroxysmal atrial fibrillation (HCC) Daily burdens range from less than 1% to 4% on recent Holter monitor.  Given he still paroxysmal, would favor addressing the A. fib around the same time as left atrial appendage closure.  I discussed the ablation procedure in detail with the patient including the risk, recovery and efficacy.  He wishes to proceed.  6 to 8 weeks after ablation, would proceed with watchman implant.  He will continue Xarelto uninterrupted until at least 45 days after the watchman implant.     Risk, benefits, and alternatives to EP study and radiofrequency ablation for afib were also discussed in detail today. These risks include but are not limited to stroke, bleeding, vascular damage, tamponade, perforation, damage to the esophagus, lungs, and other structures, pulmonary vein stenosis, worsening renal function, and death. The patient understands these risk and wishes to proceed.  We will therefore proceed with catheter ablation at the next available time.  Carto, ICE, anesthesia are requested for the procedure.  Will also obtain CT PV protocol prior to the procedure to exclude LAA thrombus and further evaluate atrial anatomy.     He will also need an echocardiogram prior to  the procedure.     Ablation strategy will be PVI plus CTI ablation.   ----------     I have seen Paul Fry in the office today who is being considered for a Watchman left atrial appendage closure device. I believe they will benefit from this procedure given their history of atrial fibrillation, CHA2DS2-VASc score of 3 and unadjusted ischemic stroke rate of 3.2% per year. Unfortunately, the patient is not felt to be a long term anticoagulation candidate secondary to history of GI bleeding. The patient's chart has been reviewed and I feel that they would be a candidate for short term oral anticoagulation after Watchman implant.    It is my belief that after undergoing a LAA closure procedure, Paul Fry will not need long term anticoagulation which eliminates anticoagulation side effects and major bleeding risk.    Procedural risks for the Watchman implant have been reviewed with the patient including a 0.5% risk of stroke, <1% risk of perforation and <1% risk of device embolization.      The published clinical data on the safety and effectiveness of WATCHMAN include but are not limited to the following: - Holmes DR, Mechele Claude, Sick P et al. for the PROTECT AF Investigators. Percutaneous closure of the left atrial appendage versus warfarin therapy for prevention of stroke in patients with atrial fibrillation: a randomised non-inferiority trial. Lancet 2009; 374: 534-42. Mechele Claude, Doshi SK, Sievert  Harriette Ohara et al. on behalf of the PROTECT AF Investigators. Percutaneous Left Atrial Appendage Closure for Stroke Prophylaxis in Patients With Atrial Fibrillation 2.3-Year Follow-up of the PROTECT AF (Watchman Left Atrial Appendage System for Embolic Protection in Patients With Atrial Fibrillation) Trial. Circulation 2013; 127:720-729. - Alli O, Doshi S,  Kar S, Reddy VY, Sievert H et al. Quality of Life Assessment in the Randomized PROTECT AF (Percutaneous Closure of the Left Atrial  Appendage Versus Warfarin Therapy for Prevention of Stroke in Patients With Atrial Fibrillation) Trial of Patients at Risk for Stroke With Nonvalvular Atrial Fibrillation. J Am Coll Cardiol 2013; 74:2595-6. Vertell Limber DR, Tarri Abernethy, Price M, [place], Sievert H, Doshi S, Huber K, Reddy V. Prospective randomized evaluation of the Watchman left atrial appendage Device in patients with atrial fibrillation versus long-term warfarin therapy; the PREVAIL trial. Journal of the [employer] of Cardiology, Vol. 4, No. 1, 2014, 1-11. - Kar S, Doshi SK, Sadhu A, Horton R, Osorio J et al. Primary outcome evaluation of a next-generation left atrial appendage closure device: results from the PINNACLE FLX trial. Circulation 2021;143(18)1754-1762.      After today's visit with the patient which was dedicated solely for shared decision making visit regarding LAA closure device, the patient decided to proceed with the LAA appendage closure procedure scheduled to be done in the near future at [facility]. Prior to the procedure, I would like to obtain a gated CT scan of the chest with contrast timed for PV/LA visualization.        2. Typical atrial flutter ([place]) See #1   3. Primary hypertension Controlled.  Continue current regimen   4. Gastrointestinal hemorrhage, unspecified gastrointestinal hemorrhage type Watchman as an #1         Total time spent with patient today 42 minutes. This includes reviewing records, evaluating the patient and coordinating care.    Medication Adjustments/Labs and Tests Ordered: Current medicines are reviewed at length with the patient today.  Concerns regarding medicines are outlined above.  No orders of the defined types were placed in this encounter.   No orders of the defined types were placed in this encounter.       Signed, Lars Mage, MD, [facility], [facility] 02/23/2021 8:46 AM    Electrophysiology [facility]       --------------------------------------  I have seen, examined the patient, and reviewed the above assessment and plan.    Plan for [place] today. Procedure reviewed.   Vickie Epley, MD 07/09/2021 7:06 AM

## 2021-07-10 LAB — BASIC METABOLIC PANEL
Anion gap: 8 (ref 5–15)
BUN: 11 mg/dL (ref 8–23)
CO2: 23 mmol/L (ref 22–32)
Calcium: 8.8 mg/dL — ABNORMAL LOW (ref 8.9–10.3)
Chloride: 101 mmol/L (ref 98–111)
Creatinine, Ser: 0.86 mg/dL (ref 0.61–1.24)
GFR, Estimated: >60 mL/min (ref >60)
Glucose, Bld: 124 mg/dL — ABNORMAL HIGH (ref 70–99)
Potassium: 4 mmol/L (ref 3.5–5.1)
Sodium: 132 mmol/L — ABNORMAL LOW (ref 135–145)

## 2021-07-10 MED ORDER — AMOXICILLIN 500 MG PO CAPS: 2000.0000 mg | ORAL_CAPSULE | ORAL | 0 refills | Status: DC

## 2021-07-10 NOTE — Progress Notes (Signed)
Nursing dc note ? ?Patient alert and oriented verbalized understanding of dc instructions. Belongings given ?Upon dc ?

## 2021-07-13 ENCOUNTER — Telehealth: Payer: Self-pay

## 2021-07-13 NOTE — Telephone Encounter (Signed)
?  [facility]ENTER   ?Watchman Team ? ?Contacted the patient regarding discharge from [facility] on 07/10/2021 ? ?The patient understands to follow up with Kathyrn Drown on 08/03/2021 in preparation for TEE on 08/20/2021. ? ?The patient understands discharge instructions? Yes ? ?The patient understands medications and regimen? Yes  ? ?The patient reports groin site looks good with no S/S of infection. He did say there was minimal bleeding/oozing yesterday and a little this morning but no leaking from the site since. He will take it easy and keep the site covered. He will monitor and call back tomorrow if there is no improvement. ? ?The patient understands to call with any questions or concerns prior to scheduled visit.  ? ?

## 2021-07-17 ENCOUNTER — Telehealth: Payer: Self-pay | Admitting: Cardiology

## 2021-07-17 NOTE — Telephone Encounter (Signed)
?  [facility]   ?MULTIDISCIPLINARY HEART VALVE TEAM  ? ?Patient called regarding mild groin oozing from procedure site. He states that he continues to wear a Band-Aid daily. He reports that he will place a new Band-Aid in the morning which will have dried blood by the end of the day. There is no pain or hardened area. He has mild bruising still. He was scheduled to see me back in the office 3/27. We will move this to 3/20. We have reviewed s/s to monitor for and he will call if he feels he needs to be seen prior to this reschedule.  ? ?Kathyrn Drown NP-C ?Structural Heart Team  ?Pager: [phone] ?Phone: [phone] ? ?

## 2021-07-23 NOTE — Progress Notes (Signed)
?[facility] AND [facility]ULAR CENTER   ?                                  ?Cardiology Office Note:   ? ?Date:  07/27/2021  ? ?ID:  Paul Fry, DOB 1945/07/25, MRN [id number] ? ?PCP:  [name], MD  ?[facility] Cardiologist:  Truitt Merle, NP (Inactive)  ?[place] [facility] Electrophysiologist:  Vickie Epley, MD  ? ?Referring MD: [name], MD  ? ?Chief Complaint  ?Patient presents with  ? Follow-up  ?  Right groin site   ? ?History of Present Illness:   ? ?ISSAIC Fry is a 76 y.o. male with a hx of DM2, CAD, systolic dysfunction, atrial flutter/fibrillation, and GI bleeding secondary to anticoagulation.  ?  ?[name] underwent atrial fibrillation/flutter ablation with Dr. Quentin Ore 05/15/21 and did well since that time. He was seen by the AF Clinic 06/12/21 for follow up and reported symptoms improvement. Plan at that time was to proceed with Watchman implant 07/09/21.  ?  ?Pre procedure CT performed prior to ablation that showed left atrial appendage as a large chicken wing morphology without thrombus. A 31 mm watchman FLX device is recommended based on the above landing zone measurements (24.7 mm maximum diameter; 20% compression) with an inferior posterior IAS puncture site is recommended. CAC score of 576.  ?  ?The patient was admitted and underwent LAAO placement with Watchman FLX 64m with Dr. LQuentin Ore Groin site without complication on the day of discharge. A repeat TEE will be performed at approximately 45 days post procedure to ensure proper seal of the device and to ensure no device thrombus.   ? ?[name] called the office with concern about right groin site bleeding. He presents today for follow up and reports that the bleeding has stopped since his call to the office however he continues to have a "knot" in the groin area. He says the pain has improved as well. He has no change in sensation. He denies SOB, edema, palpitations, orthopnea, dizziness, or syncope.  ?  ?Past Medical History:   ?Diagnosis Date  ? Allergy   ? SEASONAL  ? Anemia   ? Arthritis   ? KNEES  ? BPH (benign prostatic hyperplasia)   ? Diverticulosis of colon   ? Elevated PSA   ? 1.22 on 01-20-2016  ? Feeling of incomplete bladder emptying   ? GERD (gastroesophageal reflux disease)   ? Hiatal hernia   ? History of adenomatous polyp of colon   ? 2014 tubular adenoma  ? History of chronic gastritis   ? History of Helicobacter pylori infection   ? 07/ 2016  ? History of lower GI bleeding   ? 07/ 2010 and 12/ 2011  diverticular bleed both times  ? Hyperlipidemia   ? Hypertension   ? Lower urinary tract symptoms (LUTS)   ? [place]ischemic cardiomyopathy ([place]   ? Plantar fascia syndrome   ? 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 Watchman FLX 370mwith Dr. LaQuentin Ore? Strains to urinate   ? INTERMITTANT  ? Type 2 diabetes, diet controlled [place]  ? ON BORDER LINE,NEVER TOOK MEDICATION.  ? Wears glasses   ? ? ?Past Surgical History:  ?Procedure Laterality Date  ? APPENDECTOMY  teenager  ? ATRIAL FIBRILLATION ABLATION N/A 05/15/2021  ? Procedure: ATRIAL FIBRILLATION ABLATION;  Surgeon: [name]  MD;  Location: [place] [facility];  Service: Cardiovascular;  Laterality: N/A;  ? CARDIAC CATHETERIZATION  09-10-2008   dr dalton mclean  ? mild non-obstructive CAD;  30-40% mRCA, 20%pLAD,  ef 55%  ? CARDIOVASCULAR STRESS TEST  07/29/2011  ? Low risk nuclear study w/ no ischemia or infarct/  LVEF 47% and  apical hypokinesis (08-07-2012 cardiac MRI -- EF 49% w/ no hyperenhancement distal septal and apical hypokinesis, mild LAE, mild RVE, mild AR with mild dilated sinus 6m)  ? CATARACT EXTRACTION W/ INTRAOCULAR LENS  IMPLANT, BILATERAL  2016  ? COLONOSCOPY  last one 12-21-2012  ? CYSTOSCOPY WITH INSERTION OF UROLIFT N/A 06/17/2016  ? Procedure: CYSTOSCOPY WITH INSERTION OF UROLIFT;  Surgeon: SCarolan Clines MD;  [facility]  Service: Urology;  Laterality: N/A;  ?  ESOPHAGOGASTRODUODENOSCOPY  last one 11-22-2014  ? LEFT ATRIAL APPENDAGE OCCLUSION N/A 07/09/2021  ? Procedure: LEFT ATRIAL APPENDAGE OCCLUSION;  Surgeon: LVickie Epley MD;  [facility];  Service: Cardiovascular;  Laterality: N/A;  ? TEE WITHOUT CARDIOVERSION N/A 07/09/2021  ? Procedure: TRANSESOPHAGEAL ECHOCARDIOGRAM (TEE);  Surgeon: LVickie Epley MD;  [place] [facility];  Service: Cardiovascular;  Laterality: N/A;  ? TRANSTHORACIC ECHOCARDIOGRAM  09/17/2014  ? ef 50-55%/  trivial AR, MR and PR/  mild LAE and RAE/  mild TR  ? ? ?Current Medications: ?Current Meds  ?Medication Sig  ? amoxicillin (AMOXIL) 500 MG capsule Take 4 capsules (2,000 mg total) by mouth as directed. Take 4 tablets 1 hour prior to dental work, including cleanings.  ? ascorbic acid (VITAMIN C) 1000 MG tablet Take 1,000 mg by mouth daily.  ? atorvastatin (LIPITOR) 20 MG tablet Take 1 tablet (20 mg total) by mouth every morning.  ? calcium carbonate (TUMS - DOSED IN MG ELEMENTAL CALCIUM) 500 MG chewable tablet Chew 1 tablet by mouth as needed for indigestion. Reported on 07/17/2015  ? CALCIUM-VITAMIN D PO Take 1 tablet by mouth daily.  ? fish oil-omega-3 fatty acids 1000 MG capsule Take 1 g by mouth daily.  ? Iron, Ferrous Sulfate, 325 (65 Fe) MG TABS Take 325 mg by mouth every other day.  ? losartan (COZAAR) 25 MG tablet Take 0.5 tablets (12.5 mg total) by mouth every morning.  ? metoprolol succinate (TOPROL-XL) 50 MG 24 hr tablet Take 1 tablet (50 mg total) by mouth daily. Take with or immediately following a meal.  ? Multiple Vitamin (MULTIVITAMIN) tablet Take 1 tablet by mouth daily.  ? rivaroxaban (XARELTO) 20 MG TABS tablet Take 1 tablet (20 mg total) by mouth daily with supper.  ? tamsulosin (FLOMAX) 0.4 MG CAPS capsule Take 2 capsules (0.8 mg total) by mouth daily.  ?  ? ?Allergies:   Aspirin  ? ?Social History  ? ?Socioeconomic History  ? Marital status: Married  ?  Spouse name: Not on file  ? Number of  children: 0  ? Years of education: Not on file  ? Highest education level: Not on file  ?Occupational History  ? Occupation: truck dGeophysicist/field seismologist retired  ?  Employer: RETIRED  ?Tobacco Use  ? Smoking status: Never  ? Smokeless tobacco: Never  ?Vaping Use  ? Vaping Use: Never used  ?Substance and Sexual Activity  ? Alcohol use: Not Currently  ?  Comment: rarely drinks beer  ? Drug use: No  ? Sexual activity: Not Currently  ?Other Topics Concern  ? Not on file  ?Social History Narrative  ? Married  2003, no children.   ? Retired Administrator, gets regular exercise- walking.   ? Step daughter is MD in [place]  ? ?Social Determinants of Health  ? ?Financial Resource Strain: Low Risk   ? Difficulty of Paying Living Expenses: Not very hard  ?Food Insecurity: Not on file  ?Transportation Needs: Not on file  ?Physical Activity: Not on file  ?Stress: Not on file  ?Social Connections: Not on file  ?  ? ?Family History: ?The patient's family history includes Colon cancer (age of onset: 59) in his mother; Colon polyps in his brother; Heart disease in his sister; Hypertension in his sister and sister; Kidney disease in his sister; Lung cancer in his brother. There is no history of Diabetes, Depression, Stroke, Alcohol abuse, Prostate cancer, Liver cancer, Rectal cancer, or Stomach cancer. ? ?ROS:   ?Please see the history of present illness.    ?All other systems reviewed and are negative. ? ?EKGs/Labs/Other Studies Reviewed:   ? ?The following studies were reviewed today: ? ?LAAO 07/09/21: ? ?CONCLUSIONS:  ?1.Successful implantation of a WATCHMAN left atrial appendage occlusive device    ?2. TEE demonstrating no LAA thrombus ?3. No early apparent complications.  ?  ?Post Implant Anticoagulation Strategy: ?Continue xarelto 22m by mouth daily for the 45 days after implant. If 45-day TEE criteria are met, transition to plavix 776mby mouth daily to complete 6 months of post-implant therapy.  ?   ?EKG:  EKG is not ordered today.  ? ?Recent  Labs: ?10/16/2020: ALT 19 ?06/12/2021: Hemoglobin 11.6; Platelets 311 ?07/10/2021: BUN 11; Creatinine, Ser 0.86; Potassium 4.0; Sodium 132  ?Recent Lipid Panel ?   ?Component Value Date/Time  ? CHOL 129 10/16/2020 0904  ? TRIG 115.

## 2021-07-23 NOTE — H&P (View-Only) (Signed)
?[facility]   ?                                  ?Cardiology Office Note:   ? ?Date:  07/27/2021  ? ?ID:  Paul Fry, DOB [birth year], MRN [id number] ? ?PCP:  [name], MD  ?[facility] Cardiologist:  Truitt Merle, NP (Inactive)  ?[facility] Electrophysiologist:  Vickie Epley, MD  ? ?Referring MD: [name], MD  ? ?Chief Complaint  ?Patient presents with  ? Follow-up  ?  Right groin site   ? ?History of Present Illness:   ? ?Paul Fry is a 76 y.o. male with a hx of DM2, CAD, systolic dysfunction, atrial flutter/fibrillation, and GI bleeding secondary to anticoagulation.  ?  ?Paul Fry underwent atrial fibrillation/flutter ablation with Dr. Quentin Ore 05/15/21 and did well since that time. He was seen by the [facility] 06/12/21 for follow up and reported symptoms improvement. Plan at that time was to proceed with Watchman implant 07/09/21.  ?  ?Pre procedure CT performed prior to ablation that showed left atrial appendage as a large chicken wing morphology without thrombus. A 31 mm watchman FLX device is recommended based on the above landing zone measurements (24.7 mm maximum diameter; 20% compression) with an inferior posterior IAS puncture site is recommended. CAC score of 576.  ?  ?The patient was admitted and underwent LAAO placement with Watchman FLX 82m with Dr. LQuentin Ore Groin site without complication on the day of discharge. A repeat TEE will be performed at approximately 45 days post procedure to ensure proper seal of the device and to ensure no device thrombus.   ? ?Paul Fry called the office with concern about right groin site bleeding. He presents today for follow up and reports that the bleeding has stopped since his call to the office however he continues to have a "knot" in the groin area. He says the pain has improved as well. He has no change in sensation. He denies SOB, edema, palpitations, orthopnea, dizziness, or syncope.  ?  ?Past Medical History:   ?Diagnosis Date  ? Allergy   ? SEASONAL  ? Anemia   ? Arthritis   ? KNEES  ? BPH (benign prostatic hyperplasia)   ? Diverticulosis of colon   ? Elevated PSA   ? 1.22 on 01-20-2016  ? Feeling of incomplete bladder emptying   ? GERD (gastroesophageal reflux disease)   ? Hiatal hernia   ? History of adenomatous polyp of colon   ? 2014 tubular adenoma  ? History of chronic gastritis   ? History of Helicobacter pylori infection   ? 07/ 2016  ? History of lower GI bleeding   ? 07/ 2010 and 12/ 2011  diverticular bleed both times  ? Hyperlipidemia   ? Hypertension   ? Lower urinary tract symptoms (LUTS)   ? Nonischemic cardiomyopathy ([place]   ? Plantar fascia syndrome   ? 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 Watchman FLX 359mwith Dr. LaQuentin Ore? Strains to urinate   ? INTERMITTANT  ? Type 2 diabetes, diet controlled ([place]  ? ON BORDER LINE,NEVER TOOK MEDICATION.  ? Wears glasses   ? ? ?Past Surgical History:  ?Procedure Laterality Date  ? APPENDECTOMY  teenager  ? ATRIAL FIBRILLATION ABLATION N/A 05/15/2021  ? Procedure: ATRIAL FIBRILLATION ABLATION;  Surgeon: [name]  MD;  Location: [facility];  Service: Cardiovascular;  Laterality: N/A;  ? CARDIAC CATHETERIZATION  09-10-2008   dr dalton mclean  ? mild non-obstructive CAD;  30-40% mRCA, 20%pLAD,  ef 55%  ? CARDIOVASCULAR STRESS TEST  07/29/2011  ? Low risk nuclear study w/ no ischemia or infarct/  LVEF 47% and  apical hypokinesis (08-07-2012 cardiac MRI -- EF 49% w/ no hyperenhancement distal septal and apical hypokinesis, mild LAE, mild RVE, mild AR with mild dilated sinus 60m)  ? CATARACT EXTRACTION W/ INTRAOCULAR LENS  IMPLANT, BILATERAL  2016  ? COLONOSCOPY  last one 12-21-2012  ? CYSTOSCOPY WITH INSERTION OF UROLIFT N/A 06/17/2016  ? Procedure: CYSTOSCOPY WITH INSERTION OF UROLIFT;  Surgeon: SCarolan Clines MD;  [facility]  Service: Urology;  Laterality: N/A;  ?  ESOPHAGOGASTRODUODENOSCOPY  last one 11-22-2014  ? LEFT ATRIAL APPENDAGE OCCLUSION N/A 07/09/2021  ? Procedure: LEFT ATRIAL APPENDAGE OCCLUSION;  Surgeon: LVickie Epley MD;  [facility];  Service: Cardiovascular;  Laterality: N/A;  ? TEE WITHOUT CARDIOVERSION N/A 07/09/2021  ? Procedure: TRANSESOPHAGEAL ECHOCARDIOGRAM (TEE);  Surgeon: LVickie Epley MD;  [facility];  Service: Cardiovascular;  Laterality: N/A;  ? TRANSTHORACIC ECHOCARDIOGRAM  09/17/2014  ? ef 50-55%/  trivial AR, MR and PR/  mild LAE and RAE/  mild TR  ? ? ?Current Medications: ?Current Meds  ?Medication Sig  ? amoxicillin (AMOXIL) 500 MG capsule Take 4 capsules (2,000 mg total) by mouth as directed. Take 4 tablets 1 hour prior to dental work, including cleanings.  ? ascorbic acid (VITAMIN C) 1000 MG tablet Take 1,000 mg by mouth daily.  ? atorvastatin (LIPITOR) 20 MG tablet Take 1 tablet (20 mg total) by mouth every morning.  ? calcium carbonate (TUMS - DOSED IN MG ELEMENTAL CALCIUM) 500 MG chewable tablet Chew 1 tablet by mouth as needed for indigestion. Reported on 07/17/2015  ? CALCIUM-VITAMIN D PO Take 1 tablet by mouth daily.  ? fish oil-omega-3 fatty acids 1000 MG capsule Take 1 g by mouth daily.  ? Iron, Ferrous Sulfate, 325 (65 Fe) MG TABS Take 325 mg by mouth every other day.  ? losartan (COZAAR) 25 MG tablet Take 0.5 tablets (12.5 mg total) by mouth every morning.  ? metoprolol succinate (TOPROL-XL) 50 MG 24 hr tablet Take 1 tablet (50 mg total) by mouth daily. Take with or immediately following a meal.  ? Multiple Vitamin (MULTIVITAMIN) tablet Take 1 tablet by mouth daily.  ? rivaroxaban (XARELTO) 20 MG TABS tablet Take 1 tablet (20 mg total) by mouth daily with supper.  ? tamsulosin (FLOMAX) 0.4 MG CAPS capsule Take 2 capsules (0.8 mg total) by mouth daily.  ?  ? ?Allergies:   Aspirin  ? ?Social History  ? ?Socioeconomic History  ? Marital status: Married  ?  Spouse name: Not on file  ? Number of  children: 0  ? Years of education: Not on file  ? Highest education level: Not on file  ?Occupational History  ? Occupation: truck dGeophysicist/field seismologist retired  ?  Employer: RETIRED  ?Tobacco Use  ? Smoking status: Never  ? Smokeless tobacco: Never  ?Vaping Use  ? Vaping Use: Never used  ?Substance and Sexual Activity  ? Alcohol use: Not Currently  ?  Comment: rarely drinks beer  ? Drug use: No  ? Sexual activity: Not Currently  ?Other Topics Concern  ? Not on file  ?Social History Narrative  ? Married  2003, no children.   ? Retired Administrator, gets regular exercise- walking.   ? Step daughter is MD in [place]  ? ?Social Determinants of Health  ? ?Financial Resource Strain: Low Risk   ? Difficulty of Paying Living Expenses: Not very hard  ?Food Insecurity: Not on file  ?Transportation Needs: Not on file  ?Physical Activity: Not on file  ?Stress: Not on file  ?Social Connections: Not on file  ?  ? ?Family History: ?The patient's family history includes Colon cancer (age of onset: 48) in his mother; Colon polyps in his brother; Heart disease in his sister; Hypertension in his sister and sister; Kidney disease in his sister; Lung cancer in his brother. There is no history of Diabetes, Depression, Stroke, Alcohol abuse, Prostate cancer, Liver cancer, Rectal cancer, or Stomach cancer. ? ?ROS:   ?Please see the history of present illness.    ?All other systems reviewed and are negative. ? ?EKGs/Labs/Other Studies Reviewed:   ? ?The following studies were reviewed today: ? ?LAAO 07/09/21: ? ?CONCLUSIONS:  ?1.Successful implantation of a WATCHMAN left atrial appendage occlusive device    ?2. TEE demonstrating no LAA thrombus ?3. No early apparent complications.  ?  ?Post Implant Anticoagulation Strategy: ?Continue xarelto 64m by mouth daily for the 45 days after implant. If 45-day TEE criteria are met, transition to plavix 77mby mouth daily to complete 6 months of post-implant therapy.  ?   ?EKG:  EKG is not ordered today.  ? ?Recent  Labs: ?10/16/2020: ALT 19 ?06/12/2021: Hemoglobin 11.6; Platelets 311 ?07/10/2021: BUN 11; Creatinine, Ser 0.86; Potassium 4.0; Sodium 132  ?Recent Lipid Panel ?   ?Component Value Date/Time  ? CHOL 129 10/16/2020 0904  ? TRIG 115.

## 2021-07-27 ENCOUNTER — Ambulatory Visit (INDEPENDENT_AMBULATORY_CARE_PROVIDER_SITE_OTHER): Payer: Medicare Other | Admitting: Cardiology

## 2021-07-27 ENCOUNTER — Other Ambulatory Visit: Payer: Self-pay

## 2021-07-27 VITALS — BP 132/50 | HR 74 | Ht 72.0 in | Wt 203.2 lb

## 2021-07-27 DIAGNOSIS — I10 Essential (primary) hypertension: Secondary | ICD-10-CM

## 2021-07-27 DIAGNOSIS — I480 Paroxysmal atrial fibrillation: Secondary | ICD-10-CM | POA: Diagnosis not present

## 2021-07-27 DIAGNOSIS — Z95818 Presence of other cardiac implants and grafts: Secondary | ICD-10-CM

## 2021-07-27 DIAGNOSIS — I729 Aneurysm of unspecified site: Secondary | ICD-10-CM | POA: Diagnosis not present

## 2021-07-27 DIAGNOSIS — D6869 Other thrombophilia: Secondary | ICD-10-CM

## 2021-07-27 LAB — CBC
Hematocrit: 34.5 % — ABNORMAL LOW (ref 37.5–51.0)
Hemoglobin: 11.3 g/dL — ABNORMAL LOW (ref 13.0–17.7)
MCH: 26.6 pg (ref 26.6–33.0)
MCHC: 32.8 g/dL (ref 31.5–35.7)
MCV: 81 fL (ref 79–97)
Platelets: 346 10*3/uL (ref 150–450)
RBC: 4.25 x10E6/uL (ref 4.14–5.80)
RDW: 22.3 % — ABNORMAL HIGH (ref 11.6–15.4)
WBC: 6.8 10*3/uL (ref 3.4–10.8)

## 2021-07-27 NOTE — Patient Instructions (Signed)
Medication Instructions:  ?Your physician recommends that you continue on your current medications as directed. Please refer to the Current Medication list given to you today. ? ?*If you need a refill on your cardiac medications before your next appointment, please call your pharmacy* ? ? ?Lab Work: ?Cbc- today  ? ?If you have labs (blood work) drawn today and your tests are completely normal, you will receive your results only by: ?MyChart Message (if you have MyChart) OR ?A paper copy in the mail ?If you have any lab test that is abnormal or we need to change your treatment, we will call you to review the results. ? ? ?Testing/Procedures: ?Your physician has requested that you have a ultrasound of your leg.  ? ? ?Follow-Up: ?Follow up as scheduled }  ? ? ?Other Instructions ?None   ?

## 2021-07-30 ENCOUNTER — Encounter: Payer: Self-pay | Admitting: Family Medicine

## 2021-07-30 ENCOUNTER — Ambulatory Visit (INDEPENDENT_AMBULATORY_CARE_PROVIDER_SITE_OTHER): Payer: Medicare Other | Admitting: Family Medicine

## 2021-07-30 ENCOUNTER — Other Ambulatory Visit: Payer: Self-pay

## 2021-07-30 VITALS — BP 118/82 | HR 85 | Temp 98.5°F | Ht 72.0 in | Wt 199.0 lb

## 2021-07-30 DIAGNOSIS — D649 Anemia, unspecified: Secondary | ICD-10-CM

## 2021-07-30 DIAGNOSIS — E119 Type 2 diabetes mellitus without complications: Secondary | ICD-10-CM

## 2021-07-30 DIAGNOSIS — L989 Disorder of the skin and subcutaneous tissue, unspecified: Secondary | ICD-10-CM

## 2021-07-30 LAB — HEMOGLOBIN A1C: Hgb A1c MFr Bld: 6.8 % — ABNORMAL HIGH (ref 4.6–6.5)

## 2021-07-30 LAB — IRON: Iron: 76 ug/dL (ref 42–165)

## 2021-07-30 NOTE — Progress Notes (Signed)
This visit occurred during the SARS-CoV-2 public health emergency.  Safety protocols were in place, including screening questions prior to the visit, additional usage of staff PPE, and extensive cleaning of exam room while observing appropriate contact time as indicated for disinfecting solutions. ? ?Diabetes:  ?No meds.   ?Hypoglycemic episodes: no sx ?Hyperglycemic episodes: no sx ?Feet problems: callous L foot.   ?Blood Sugars averaging: not checked.   ?eye exam within last year: scheduled for this summer per patient report.   ? ?He has f/u pending re: vascular imaging.  He had ID f/u pending re: H pylori.   ? ?H/o anemia, most recent HGB stable 11.3, iron pending.  Taking iron QOD.   ? ?PMH and SH reviewed ? ?Meds, vitals, and allergies reviewed.  ? ?ROS: Per HPI unless specifically indicated in ROS section  ? ?GEN: nad, alert and oriented ?HEENT: ncat ?NECK: supple w/o LA ?CV: rrr. ?PULM: ctab, no inc wob ?ABD: soft, +bs ?EXT: no edema ?SKIN: no acute rash ? ?Diabetic foot exam: ?Normal inspection ?No skin breakdown ?Callous on the distal L 5th MT, no ulceration.   ?Normal DP pulses ?Normal sensation to light touch and monofilament ?Nails normal ?L foot with 45m dark macule near the posterior lateal midfoot.  This is round and not irregular.  It is uniform and does not have varying pigmentation. ?

## 2021-07-30 NOTE — Patient Instructions (Addendum)
Go to the lab on the way out.   If you have mychart we'll likely use that to update you.    ?Take care.  Glad to see you. ?Plan on recheck at yearly visit in about 3-4 months.   ?Keep using a pad on the callous.  Update me as needed.   ?

## 2021-07-31 ENCOUNTER — Ambulatory Visit (HOSPITAL_COMMUNITY)
Admission: RE | Admit: 2021-07-31 | Discharge: 2021-07-31 | Disposition: A | Discharge status: Home / Self Care | Payer: Medicare Other | Source: Ambulatory Visit | Attending: Cardiovascular Disease | Admitting: Cardiovascular Disease

## 2021-07-31 DIAGNOSIS — L7632 Postprocedural hematoma of skin and subcutaneous tissue following other procedure: Secondary | ICD-10-CM

## 2021-07-31 DIAGNOSIS — I729 Aneurysm of unspecified site: Secondary | ICD-10-CM | POA: Insufficient documentation

## 2021-07-31 DIAGNOSIS — I728 Aneurysm of other specified arteries: Secondary | ICD-10-CM

## 2021-08-02 DIAGNOSIS — D649 Anemia, unspecified: Secondary | ICD-10-CM | POA: Insufficient documentation

## 2021-08-02 NOTE — Assessment & Plan Note (Signed)
?  H/o anemia, most recent HGB stable 11.3, iron pending.  Taking iron QOD.   See notes on labs. ?

## 2021-08-02 NOTE — Assessment & Plan Note (Signed)
No medications.  Continue work on diet and exercise.  Foot care discussed with patient.  See notes on labs. ?

## 2021-08-02 NOTE — Assessment & Plan Note (Signed)
?  L foot with 45m dark macule near the posterior lateal midfoot.  This is round and not irregular.  It is uniform and does not have varying pigmentation. ? ?Discussed with patient.  Benign appearing.  We can monitor this here in clinic.  He can update me if it is changing. ?

## 2021-08-03 ENCOUNTER — Ambulatory Visit: Payer: Medicare Other

## 2021-08-04 ENCOUNTER — Telehealth: Payer: Self-pay

## 2021-08-04 DIAGNOSIS — D509 Iron deficiency anemia, unspecified: Secondary | ICD-10-CM

## 2021-08-04 DIAGNOSIS — Z86010 Personal history of colonic polyps: Secondary | ICD-10-CM

## 2021-08-04 DIAGNOSIS — Z800 Family history of malignant neoplasm of digestive organs: Secondary | ICD-10-CM

## 2021-08-04 NOTE — Telephone Encounter (Signed)
Spoke with patient regarding colon for follow up of piecemeal polypectomy. He has been scheduled for 6/6 at 8:00 am with Dr. Fuller Plan. PV scheduled for 5/22 at 8:00 am. He will need to have lab work done at previsit appointment, reminder sent to self to remind patient. Patient is currently post op from a Watchman implant & is having a TEE completed in April. Will send cardiac clearance note to Dr. [name]. Amb ref placed.  ?

## 2021-08-04 NOTE — Telephone Encounter (Signed)
? ?  Patient Name: Paul Fry  ?DOB: 1946-04-28 ?MRN: [id number] ? ?Primary Cardiologist: Candee Furbish, MD ? ?Chart reviewed as part of pre-operative protocol coverage, pending colonoscopy in June 2023.  ? ?Patient has history of atrial fib/flutter s/p ablation 05/15/21 and recent Watchman procedure on 07/09/21. He will require follow-up visit to make the determination of when this is suitable as well as recommendations for blood thinner. The patient already has an upcoming appointment scheduled 08/14/21 with Dr. Quentin Ore at which time this clearance can be addressed given recent cardiac interventions. (Colonoscopy date of 10/13/21 falls after appointment date.) ? ?I added preop FYI to appointment notes so that provider is aware to address at time of OV. Per office protocol, the provider seeing this patient should forward their finalized clearance decision to requesting party below. Will hold off on routing to pharm team as plan for blood thinners may be different by the time of follow-up visit.) ? ?Will route to GI team as FYI. ? ?Will remove from preop box as separate preop APP input not necessary at this time. ? ?Charlie Pitter, PA-C ?08/04/2021, 10:21 AM ? ? ?

## 2021-08-04 NOTE — Telephone Encounter (Signed)
[facility] Pre-operative Risk Assessment  ?   ?Request for surgical clearance:     Endoscopy Procedure ? ?What type of surgery is being performed?    Colonoscopy ? ?When is this surgery scheduled?    10/13/21 at 8:00 am ? ?What type of clearance is required ?   Pharmacy & Cardiac (post watchman implant & upcoming TEE) ? ?Are there any medications that need to be held prior to surgery and how long? Xarelto ? ?Practice name and name of physician performing surgery?      [facility], Dr. Lucio Edward ? ?What is your office phone and fax number?      Phone- [phone]  Fax- [phone] ? ?Anesthesia type (None, local, MAC, general) ?       MAC  ?

## 2021-08-12 ENCOUNTER — Ambulatory Visit (INDEPENDENT_AMBULATORY_CARE_PROVIDER_SITE_OTHER): Payer: Medicare Other | Admitting: Internal Medicine

## 2021-08-12 ENCOUNTER — Other Ambulatory Visit: Payer: Self-pay

## 2021-08-12 ENCOUNTER — Encounter: Payer: Self-pay | Admitting: Internal Medicine

## 2021-08-12 ENCOUNTER — Encounter (HOSPITAL_COMMUNITY): Payer: Self-pay | Admitting: Internal Medicine

## 2021-08-12 VITALS — BP 120/74 | HR 72 | Temp 97.9°F | Wt 195.0 lb

## 2021-08-12 DIAGNOSIS — A048 Other specified bacterial intestinal infections: Secondary | ICD-10-CM | POA: Diagnosis not present

## 2021-08-12 NOTE — Progress Notes (Signed)
?  ? ? ? ? ?[facility] ? ?CHIEF COMPLAINT:   ? ?Follow up for H pylori infection ? ?SUBJECTIVE:   ? ?Paul Fry is a 76 y.o. male with PMHx as below who presents to the clinic for H pylori infection.  ? ?Patient here today for routine follow up.  Last seen on 06/11/21 after persistently testing positive for H pylori.  He had previously completed the following regimens: ? ?Tetracycline, Flagyl, Bismuth x 14 days (2018) ?Bismuth, Flagyl, Doxy, PPI x 14 days (2022) ?Amoxicillin 1 gm TID and PPI x 14 days (2022) ? ?In February, we prescribed a 4th salvage regimen of Pantoprazole '40mg'$  BID, Rifabutin '300mg'$  daily, and Amoxicillin '750mg'$  TID.  ? ?He completed this without issue but did have a problem with receiving all 14 days of amoxicillin per his report although dispense hx indicates he received an adequate 14 day supply on 2/3.  He was able to sort this out with pharmacy and did not miss any doses per his report.   ? ?He states he has held his PPI x 2 weeks and has not had anything to eat or drink for 2 hours. ? ?Please see A&P for the details of today's visit and status of the patient's medical problems.  ? ?Patient's Medications  ?New Prescriptions  ? No medications on file  ?Previous Medications  ? AMOXICILLIN (AMOXIL) 500 MG CAPSULE    Take 4 capsules (2,000 mg total) by mouth as directed. Take 4 tablets 1 hour prior to dental work, including cleanings.  ? ASCORBIC ACID (VITAMIN C) 1000 MG TABLET    Take 1,000 mg by mouth daily.  ? ATORVASTATIN (LIPITOR) 20 MG TABLET    Take 1 tablet (20 mg total) by mouth every morning.  ? CALCIUM CARBONATE (TUMS - DOSED IN MG ELEMENTAL CALCIUM) 500 MG CHEWABLE TABLET    Chew 1 tablet by mouth as needed for indigestion. Reported on 07/17/2015  ? CALCIUM-VITAMIN D PO    Take 1 tablet by mouth daily.  ? FISH OIL-OMEGA-3 FATTY ACIDS 1000 MG CAPSULE    Take 1 g by mouth daily.  ? IRON, FERROUS SULFATE, 325 (65 FE) MG TABS    Take 325 mg by mouth every  other day.  ? LOSARTAN (COZAAR) 25 MG TABLET    Take 0.5 tablets (12.5 mg total) by mouth every morning.  ? METOPROLOL SUCCINATE (TOPROL-XL) 50 MG 24 HR TABLET    Take 1 tablet (50 mg total) by mouth daily. Take with or immediately following a meal.  ? MULTIPLE VITAMIN (MULTIVITAMIN) TABLET    Take 1 tablet by mouth daily.  ? RIVAROXABAN (XARELTO) 20 MG TABS TABLET    Take 1 tablet (20 mg total) by mouth daily with supper.  ? TAMSULOSIN (FLOMAX) 0.4 MG CAPS CAPSULE    Take 2 capsules (0.8 mg total) by mouth daily.  ?Modified Medications  ? No medications on file  ?Discontinued Medications  ? No medications on file  ?   ? ?Past Medical History:  ?Diagnosis Date  ? Allergy   ? SEASONAL  ? Anemia   ? Arthritis   ? KNEES  ? BPH (benign prostatic hyperplasia)   ? Diverticulosis of colon   ? Elevated PSA   ? 1.22 on 01-20-2016  ? Feeling of incomplete bladder emptying   ? GERD (gastroesophageal reflux disease)   ? Hiatal hernia   ? History of adenomatous polyp of colon   ? 2014 tubular adenoma  ?  History of chronic gastritis   ? History of Helicobacter pylori infection   ? 07/ 2016  ? History of lower GI bleeding   ? 07/ 2010 and 12/ 2011  diverticular bleed both times  ? Hyperlipidemia   ? Hypertension   ? Lower urinary tract symptoms (LUTS)   ? Nonischemic cardiomyopathy ([place])   ? Plantar fascia syndrome   ? 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 Watchman FLX 47m with Dr. LQuentin Ore ? Strains to urinate   ? INTERMITTANT  ? Type 2 diabetes, diet controlled ([place]   ? ON BORDER LINE,NEVER TOOK MEDICATION.  ? Wears glasses   ? ? ?Social History  ? ?Tobacco Use  ? Smoking status: Never  ? Smokeless tobacco: Never  ?Vaping Use  ? Vaping Use: Never used  ?Substance Use Topics  ? Alcohol use: Not Currently  ?  Comment: rarely drinks beer  ? Drug use: No  ? ? ?Family History  ?Problem Relation Age of Onset  ? Colon cancer Mother 79 ? Heart disease Sister   ?     MI, pace maker  ? Kidney disease  Sister   ?     renal failure, nephrectomy- not cancer  ? Hypertension Sister   ? Hypertension Sister   ? Colon polyps Brother   ? Lung cancer Brother   ? Diabetes Neg Hx   ?     DM  ? Depression Neg Hx   ? Stroke Neg Hx   ? Alcohol abuse Neg Hx   ?     also no drug abuse   ? Prostate cancer Neg Hx   ? Liver cancer Neg Hx   ? Rectal cancer Neg Hx   ? Stomach cancer Neg Hx   ? ? ?Allergies  ?Allergen Reactions  ? Aspirin Other (See Comments)  ?  History of GI bleed  ? ? ?Review of Systems  ?Constitutional: Negative.   ?Respiratory: Negative.    ?Cardiovascular: Negative.   ?Gastrointestinal: Negative.   ? ? ?OBJECTIVE:   ? ?Vitals:  ? 08/12/21 1606  ?BP: 120/74  ?Pulse: 72  ?Temp: 97.9 ?F (36.6 ?C)  ?TempSrc: Oral  ?SpO2: 96%  ?Weight: 195 lb (88.5 kg)  ? ?Body mass index is 26.45 kg/m?. ? ?Physical Exam ?Constitutional:   ?   General: He is not in acute distress. ?   Appearance: Normal appearance.  ?HENT:  ?   Head: Normocephalic and atraumatic.  ?Eyes:  ?   Extraocular Movements: Extraocular movements intact.  ?   Conjunctiva/sclera: Conjunctivae normal.  ?Pulmonary:  ?   Effort: Pulmonary effort is normal. No respiratory distress.  ?Abdominal:  ?   General: There is no distension.  ?   Palpations: Abdomen is soft.  ?Skin: ?   General: Skin is warm and dry.  ?Neurological:  ?   General: No focal deficit present.  ?   Mental Status: He is alert and oriented to person, place, and time.  ?Psychiatric:     ?   Mood and Affect: Mood normal.     ?   Behavior: Behavior normal.  ? ? ? ?Labs and Microbiology: ? ?  Latest Ref Rng & Units 07/27/2021  ? 11:35 AM 06/12/2021  ? 10:55 AM 04/23/2021  ?  8:21 AM  ?CBC  ?WBC 3.4 - 10.8 x10E3/uL 6.8   5.0   5.8    ?Hemoglobin 13.0 - 17.7 g/dL 11.3   11.6   12.4    ?  Hematocrit 37.5 - 51.0 % 34.5   36.1   37.3    ?Platelets 150 - 450 x10E3/uL 346   311   324    ? ? ?  Latest Ref Rng & Units 07/10/2021  ? 12:48 AM 06/12/2021  ? 10:55 AM 04/23/2021  ?  8:21 AM  ?CMP  ?Glucose 70 - 99 mg/dL 124    189   125    ?BUN 8 - 23 mg/dL '11   9   13    '$ ?Creatinine 0.61 - 1.24 mg/dL 0.86   0.90   0.85    ?Sodium 135 - 145 mmol/L 132   136   140    ?Potassium 3.5 - 5.1 mmol/L 4.0   3.8   4.5    ?Chloride 98 - 111 mmol/L 101   104   106    ?CO2 22 - 32 mmol/L '23   22   27    '$ ?Calcium 8.9 - 10.3 mg/dL 8.8   8.7   9.2    ?  ? ?ASSESSMENT & PLAN:   ? ?H. pylori infection ?Patient completed his 2 week regimen of pantoprazole '40mg'$  BID, Rifabutin '300mg'$  daily, and Amoxicillin '750mg'$  approximately 6 weeks ago.  He reports having a rash that developed while on therapy.  This subsequently resolved after completing treatment.  Suspect this was due to rifabutin.  He has held his PPI for last 2 weeks per his report but ate just prior to his appointment.  Advised to RTC for lab visit and H pylori breath test when he has not eaten for 1-2 hours prior.  He will continue to hold his PPI.  Follow up depending on results of eradication testing.  ? ? ?Orders Placed This Encounter  ?Procedures  ? H. pylori breath test  ?  Standing Status:   Future  ?  Standing Expiration Date:   08/13/2022  ?  ? ? ? ? ?Mignon Pine ?[facility] ?[facility] ?08/12/2021, 4:20 PM ? ?I have personally spent 20 minutes involved in face-to-face and non-face-to-face activities for this patient on the day of the visit. Professional time spent includes the following activities: Preparing to see the patient (review of tests), Obtaining and/or reviewing separately obtained history (admission/discharge record), Performing a medically appropriate examination and/or evaluation , Ordering medications/tests/procedures, referring and communicating with other health care professionals, Documenting clinical information in the EMR, Independently interpreting results (not separately reported), Communicating results to the patient/family/caregiver, Counseling and educating the patient/family/caregiver and Care coordination (not separately  reported).  ? ?

## 2021-08-12 NOTE — Assessment & Plan Note (Addendum)
Patient completed his 2 week regimen of pantoprazole '40mg'$  BID, Rifabutin '300mg'$  daily, and Amoxicillin '750mg'$  approximately 6 weeks ago.  He reports having a rash that developed while on therapy.  This subsequently resolved after completing treatment.  Suspect this was due to rifabutin.  He has held his PPI for last 2 weeks per his report but ate just prior to his appointment.  Advised to RTC for lab visit and H pylori breath test when he has not eaten for 1-2 hours prior.  He will continue to hold his PPI.  Follow up depending on results of eradication testing.  ?

## 2021-08-12 NOTE — Patient Instructions (Addendum)
Thank you for coming to see me today. It was a pleasure seeing you. ? ?To Do: ?Make a lab appointment for your H pylori breath test when you leave ?Do not eat or drink anything 1-2 hours before the test ?Keep holding your PPI until after your test ?I will update you on the results when I get them.  For now follow up as needed ? ?If you have any questions or concerns, please do not hesitate to call the office at [phone]. ? ?Take Care,  ? ?Jule Ser ?

## 2021-08-14 ENCOUNTER — Ambulatory Visit (INDEPENDENT_AMBULATORY_CARE_PROVIDER_SITE_OTHER): Payer: Medicare Other | Admitting: Cardiology

## 2021-08-14 ENCOUNTER — Encounter: Payer: Self-pay | Admitting: Cardiology

## 2021-08-14 ENCOUNTER — Telehealth: Payer: Self-pay | Admitting: *Deleted

## 2021-08-14 ENCOUNTER — Encounter: Payer: Self-pay | Admitting: *Deleted

## 2021-08-14 VITALS — BP 124/72 | HR 65 | Ht 72.0 in | Wt 204.8 lb

## 2021-08-14 DIAGNOSIS — I10 Essential (primary) hypertension: Secondary | ICD-10-CM

## 2021-08-14 DIAGNOSIS — I480 Paroxysmal atrial fibrillation: Secondary | ICD-10-CM

## 2021-08-14 DIAGNOSIS — I483 Typical atrial flutter: Secondary | ICD-10-CM

## 2021-08-14 DIAGNOSIS — Z95818 Presence of other cardiac implants and grafts: Secondary | ICD-10-CM | POA: Diagnosis not present

## 2021-08-14 NOTE — Patient Instructions (Addendum)
Medication Instructions:  ?Your physician recommends that you continue on your current medications as directed. Please refer to the Current Medication list given to you today. ?*If you need a refill on your cardiac medications before your next appointment, please call your pharmacy* ? ?Lab Work: ?None. ?If you have labs (blood work) drawn today and your tests are completely normal, you will receive your results only by: ?MyChart Message (if you have MyChart) OR ?A paper copy in the mail ?If you have any lab test that is abnormal or we need to change your treatment, we will call you to review the results. ? ?Testing/Procedures: ?None. ? ?Follow-Up: ?At [facility], you and your health needs are our priority.  As part of our continuing mission to provide you with exceptional heart care, we have created designated Provider Care Teams.  These Care Teams include your primary Cardiologist (physician) and Advanced Practice Providers (APPs -  Physician Assistants and Nurse Practitioners) who all work together to provide you with the care you need, when you need it. ? ?Your physician wants you to follow-up in: Joellen Jersey will call you for follow up.  ? ?We recommend signing up for the patient portal called "MyChart".  Sign up information is provided on this After Visit Summary.  MyChart is used to connect with patients for Virtual Visits (Telemedicine).  Patients are able to view lab/test results, encounter notes, upcoming appointments, etc.  Non-urgent messages can be sent to your provider as well.   ?To learn more about what you can do with MyChart, go to [email or web address].   ? ?Any Other Special Instructions Will Be Listed Below (If Applicable). ? ? ? ? ?  ? ? ?

## 2021-08-14 NOTE — Progress Notes (Signed)
?Electrophysiology Office Follow up Visit Note:   ? ?Date:  08/14/2021  ? ?ID:  Paul Fry, DOB [birth year], MRN [id number] ? ?PCP:  [name], MD  ?[facility] Cardiologist:  Candee Furbish, MD  ?[facility] Electrophysiologist:  Vickie Epley, MD  ? ? ?Interval History:   ? ?Paul Fry is a 76 y.o. male who presents for a follow up visit. They were last seen in clinic 02/23/2021. ?Since their last appointment, they underwent atrial fibrillation ablation on 05/15/2021. On 07/09/2021 he had LAAO placement with Watchman FLX 31 mm. He most recently followed up in cardiology with Kathyrn Drown, NP on 07/27/2021. ? ?He also presents today for pre-op clearance for colonoscopy, scheduled for 6/6 with Dr. Fuller Plan. ? ?Overall, he is feeling well. Lately he has noticed increased energy levels. He has not been as short of breath as he used to be. ? ?He denies any palpitations, chest pain, or peripheral edema. No lightheadedness, headaches, syncope, orthopnea, or PND. ? ? ?  ? ?Past Medical History:  ?Diagnosis Date  ? Allergy   ? SEASONAL  ? Anemia   ? Arthritis   ? KNEES  ? BPH (benign prostatic hyperplasia)   ? Diverticulosis of colon   ? Elevated PSA   ? 1.22 on 01-20-2016  ? Feeling of incomplete bladder emptying   ? GERD (gastroesophageal reflux disease)   ? Hiatal hernia   ? History of adenomatous polyp of colon   ? 2014 tubular adenoma  ? History of chronic gastritis   ? History of Helicobacter pylori infection   ? 07/ 2016  ? History of lower GI bleeding   ? 07/ 2010 and 12/ 2011  diverticular bleed both times  ? Hyperlipidemia   ? Hypertension   ? Lower urinary tract symptoms (LUTS)   ? Nonischemic cardiomyopathy ([place])   ? Plantar fascia syndrome   ? 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 Watchman FLX 11m with Dr. LQuentin Ore ? Strains to urinate   ? INTERMITTANT  ? Type 2 diabetes, diet [place]   ? ON BORDER LINE,NEVER TOOK MEDICATION.  ? Wears glasses    ? ? ?Past Surgical History:  ?Procedure Laterality Date  ? APPENDECTOMY  teenager  ? ATRIAL FIBRILLATION ABLATION N/A 05/15/2021  ? Procedure: ATRIAL FIBRILLATION ABLATION;  Surgeon: LVickie Epley MD;  [facility];  Service: Cardiovascular;  Laterality: N/A;  ? CARDIAC CATHETERIZATION  09-10-2008   dr dalton mclean  ? mild non-obstructive CAD;  30-40% mRCA, 20%pLAD,  ef 55%  ? CARDIOVASCULAR STRESS TEST  07/29/2011  ? Low risk nuclear study w/ no ischemia or infarct/  LVEF 47% and  apical hypokinesis (08-07-2012 cardiac MRI -- EF 49% w/ no hyperenhancement distal septal and apical hypokinesis, mild LAE, mild RVE, mild AR with mild dilated sinus 368m  ? CATARACT EXTRACTION W/ INTRAOCULAR LENS  IMPLANT, BILATERAL  2016  ? COLONOSCOPY  last one 12-21-2012  ? CYSTOSCOPY WITH INSERTION OF UROLIFT N/A 06/17/2016  ? Procedure: CYSTOSCOPY WITH INSERTION OF UROLIFT;  Surgeon: SiCarolan ClinesMD;  [facility] Service: Urology;  Laterality: N/A;  ? ESOPHAGOGASTRODUODENOSCOPY  last one 11-22-2014  ? LEFT ATRIAL APPENDAGE OCCLUSION N/A 07/09/2021  ? Procedure: LEFT ATRIAL APPENDAGE OCCLUSION;  Surgeon: LaVickie EpleyMD;  [facility];  Service: Cardiovascular;  Laterality: N/A;  ? TEE WITHOUT CARDIOVERSION N/A 07/09/2021  ? Procedure: TRANSESOPHAGEAL ECHOCARDIOGRAM (TEE);  Surgeon: Vickie Epley, MD;  Location: [place] [facility];  Service: Cardiovascular;  Laterality: N/A;  ? TRANSTHORACIC ECHOCARDIOGRAM  09/17/2014  ? ef 50-55%/  trivial AR, MR and PR/  mild LAE and RAE/  mild TR  ? ? ?Current Medications: ?Current Meds  ?Medication Sig  ? amoxicillin (AMOXIL) 500 MG capsule Take 4 capsules (2,000 mg total) by mouth as directed. Take 4 tablets 1 hour prior to dental work, including cleanings.  ? ascorbic acid (VITAMIN C) 1000 MG tablet Take 500 mg by mouth daily.  ? atorvastatin (LIPITOR) 20 MG tablet Take 1 tablet (20 mg total) by mouth every morning.   ? CALCIUM-VITAMIN D PO Take 1 tablet by mouth daily.  ? Iron, Ferrous Sulfate, 325 (65 Fe) MG TABS Take 325 mg by mouth every other day.  ? losartan (COZAAR) 25 MG tablet Take 0.5 tablets (12.5 mg total) by mouth every morning.  ? metoprolol succinate (TOPROL-XL) 50 MG 24 hr tablet Take 1 tablet (50 mg total) by mouth daily. Take with or immediately following a meal.  ? Multiple Vitamin (MULTIVITAMIN) tablet Take 1 tablet by mouth daily.  ? OMEGA-3 FATTY ACIDS PO Take 1,200 g by mouth daily.  ? omeprazole (PRILOSEC) 20 MG capsule Take 20 mg by mouth 2 (two) times daily.  ? rivaroxaban (XARELTO) 20 MG TABS tablet Take 1 tablet (20 mg total) by mouth daily with supper.  ? tamsulosin (FLOMAX) 0.4 MG CAPS capsule Take 2 capsules (0.8 mg total) by mouth daily.  ?  ? ?Allergies:   Aspirin  ? ?Social History  ? ?Socioeconomic History  ? Marital status: Married  ?  Spouse name: Not on file  ? Number of children: 0  ? Years of education: Not on file  ? Highest education level: Not on file  ?Occupational History  ? Occupation: truck Geophysicist/field seismologist- retired  ?  Employer: RETIRED  ?Tobacco Use  ? Smoking status: Never  ? Smokeless tobacco: Never  ?Vaping Use  ? Vaping Use: Never used  ?Substance and Sexual Activity  ? Alcohol use: Not Currently  ?  Comment: rarely drinks beer  ? Drug use: No  ? Sexual activity: Not Currently  ?Other Topics Concern  ? Not on file  ?Social History Narrative  ? Married 2003, no children.   ? Retired Administrator, gets regular exercise- walking.   ? Step daughter is MD in [place]  ? ?Social Determinants of Health  ? ?Financial Resource Strain: Low Risk   ? Difficulty of Paying Living Expenses: Not very hard  ?Food Insecurity: Not on file  ?Transportation Needs: Not on file  ?Physical Activity: Not on file  ?Stress: Not on file  ?Social Connections: Not on file  ?  ? ?Family History: ?The patient's family history includes Colon cancer (age of onset: 27) in his mother; Colon polyps in his brother; Heart  disease in his sister; Hypertension in his sister and sister; Kidney disease in his sister; Lung cancer in his brother. There is no history of Diabetes, Depression, Stroke, Alcohol abuse, Prostate cancer, Liver cancer, Rectal cancer, or Stomach cancer. ? ?ROS:   ?Please see the history of present illness.    ?All other systems reviewed and are negative. ? ?EKGs/Labs/Other Studies Reviewed:   ? ?The following studies were reviewed today: ? ?Echo TEE 07/09/2021: ? 1. Left ventricular ejection fraction, by estimation, is 55 to 60%. The  ?left ventricle has normal function. The left ventricle has no regional  ?wall motion abnormalities.  Left ventricular diastolic parameters are  ?consistent with Grade I diastolic  ?dysfunction (impaired relaxation).  ? 2. Right ventricular systolic function is normal. The right ventricular  ?size is normal.  ? 3. No left atrial/left atrial appendage thrombus was detected. The LAA  ?emptying velocity was 80 cm/s.  ? 4. The mitral valve is normal in structure. Trivial mitral valve  ?regurgitation.  ? 5. The aortic valve is tricuspid. Aortic valve regurgitation is mild. No  ?aortic stenosis is present.  ? 6. Evidence of atrial level shunting detected by color flow Doppler.  ?There is a small patent foramen ovale with predominantly left to right  ?shunting across the atrial septum.  ? ?Left Atrial Appendage Occlusion 07/09/2021: ?CONCLUSIONS:  ?1.Successful implantation of a WATCHMAN left atrial appendage occlusive device    ?2. TEE demonstrating no LAA thrombus ?3. No early apparent complications.  ?  ?Post Implant Anticoagulation Strategy: ?Continue xarelto 8m by mouth daily for the 45 days after implant. If 45-day TEE criteria are met, transition to plavix 75mby mouth daily to complete 6 months of post-implant therapy.  ? ?Atrial fibrillation ablation 05/15/2021: ? ?January 26, 2021 ZIO monitor personally reviewed ?HR 33 - 191bpm, average 62bpm. ?1% burden of AF/AFL with longest episode  lasting 28 minutes. Day to day variation in AF burden (<1% - 4%) ?Occasional supraventricular ectopy, 3.2%. ?Rare ventricular ectopy, < 1%. ? ?EKG:  EKG is personally reviewed.  ?08/14/2021: sinus rhythm ? ?Recent

## 2021-08-14 NOTE — Telephone Encounter (Signed)
Left message to call back for TEE instructions.  ?

## 2021-08-14 NOTE — Telephone Encounter (Signed)
Patient returning call.

## 2021-08-14 NOTE — Progress Notes (Deleted)
Doing well post ablation and watchman ?TEE on 4/13--if ok transition to plavix and stop xarelto ? ? ? ?

## 2021-08-17 ENCOUNTER — Other Ambulatory Visit: Payer: Medicare Other

## 2021-08-17 ENCOUNTER — Other Ambulatory Visit: Payer: Self-pay

## 2021-08-17 DIAGNOSIS — A048 Other specified bacterial intestinal infections: Secondary | ICD-10-CM | POA: Diagnosis not present

## 2021-08-17 NOTE — Telephone Encounter (Signed)
Spoke to the patient about upcoming TEE instructions sent over mychart. Verbalized understanding and agreement.  ?

## 2021-08-18 LAB — H. PYLORI BREATH TEST: H. pylori Breath Test: NOT DETECTED

## 2021-08-19 ENCOUNTER — Telehealth: Payer: Self-pay

## 2021-08-19 NOTE — Telephone Encounter (Signed)
-----   Message from Mignon Pine, DO sent at 08/19/2021  8:21 AM EDT ----- ?Please let patient know that his H pylori eradication test was negative and no further ID follow up needed. ? ?Thanks ?

## 2021-08-20 ENCOUNTER — Encounter (HOSPITAL_COMMUNITY): Payer: Self-pay | Admitting: Internal Medicine

## 2021-08-20 ENCOUNTER — Encounter (HOSPITAL_COMMUNITY)
Admission: RE | Disposition: A | Discharge status: Home / Self Care | Payer: Self-pay | Source: Home / Self Care | Attending: Internal Medicine

## 2021-08-20 ENCOUNTER — Ambulatory Visit (HOSPITAL_BASED_OUTPATIENT_CLINIC_OR_DEPARTMENT_OTHER)
Admission: RE | Admit: 2021-08-20 | Discharge: 2021-08-20 | Disposition: A | Discharge status: Home / Self Care | Payer: Medicare Other | Source: Ambulatory Visit | Attending: Cardiology | Admitting: Cardiology

## 2021-08-20 ENCOUNTER — Other Ambulatory Visit: Payer: Self-pay

## 2021-08-20 ENCOUNTER — Ambulatory Visit (HOSPITAL_COMMUNITY): Payer: Medicare Other | Admitting: Anesthesiology

## 2021-08-20 ENCOUNTER — Ambulatory Visit (HOSPITAL_BASED_OUTPATIENT_CLINIC_OR_DEPARTMENT_OTHER): Payer: Medicare Other | Admitting: Anesthesiology

## 2021-08-20 ENCOUNTER — Ambulatory Visit (HOSPITAL_COMMUNITY)
Admission: RE | Admit: 2021-08-20 | Discharge: 2021-08-20 | Disposition: A | Discharge status: Home / Self Care | Payer: Medicare Other | Attending: Internal Medicine | Admitting: Internal Medicine

## 2021-08-20 DIAGNOSIS — I10 Essential (primary) hypertension: Secondary | ICD-10-CM | POA: Insufficient documentation

## 2021-08-20 DIAGNOSIS — Z95818 Presence of other cardiac implants and grafts: Secondary | ICD-10-CM | POA: Diagnosis not present

## 2021-08-20 DIAGNOSIS — I4891 Unspecified atrial fibrillation: Secondary | ICD-10-CM

## 2021-08-20 DIAGNOSIS — I480 Paroxysmal atrial fibrillation: Secondary | ICD-10-CM | POA: Insufficient documentation

## 2021-08-20 DIAGNOSIS — I2510 Atherosclerotic heart disease of native coronary artery without angina pectoris: Secondary | ICD-10-CM

## 2021-08-20 DIAGNOSIS — T82538A Leakage of other cardiac and vascular devices and implants, initial encounter: Secondary | ICD-10-CM | POA: Diagnosis not present

## 2021-08-20 DIAGNOSIS — I351 Nonrheumatic aortic (valve) insufficiency: Secondary | ICD-10-CM | POA: Diagnosis not present

## 2021-08-20 DIAGNOSIS — E119 Type 2 diabetes mellitus without complications: Secondary | ICD-10-CM | POA: Insufficient documentation

## 2021-08-20 DIAGNOSIS — K922 Gastrointestinal hemorrhage, unspecified: Secondary | ICD-10-CM

## 2021-08-20 DIAGNOSIS — I4892 Unspecified atrial flutter: Secondary | ICD-10-CM | POA: Insufficient documentation

## 2021-08-20 DIAGNOSIS — Z7901 Long term (current) use of anticoagulants: Secondary | ICD-10-CM | POA: Diagnosis not present

## 2021-08-20 DIAGNOSIS — I083 Combined rheumatic disorders of mitral, aortic and tricuspid valves: Secondary | ICD-10-CM | POA: Diagnosis not present

## 2021-08-20 HISTORY — PX: TEE WITHOUT CARDIOVERSION: SHX5443

## 2021-08-20 SURGERY — ECHOCARDIOGRAM, TRANSESOPHAGEAL
Anesthesia: General

## 2021-08-20 MED ORDER — LIDOCAINE 2% (20 MG/ML) 5 ML SYRINGE
INTRAMUSCULAR | Status: DC | PRN
  Administered 2021-08-20: 80 mg via INTRAVENOUS

## 2021-08-20 MED ORDER — GLYCOPYRROLATE 0.2 MG/ML IJ SOLN
INTRAMUSCULAR | Status: DC | PRN
  Administered 2021-08-20: .1 mg via INTRAVENOUS

## 2021-08-20 MED ORDER — SODIUM CHLORIDE 0.9 % IV SOLN: INTRAVENOUS | Status: DC

## 2021-08-20 MED ORDER — PHENYLEPHRINE 40 MCG/ML (10ML) SYRINGE FOR IV PUSH (FOR BLOOD PRESSURE SUPPORT)
PREFILLED_SYRINGE | INTRAVENOUS | Status: DC | PRN
Start: 2021-08-20 — End: 2021-08-20
  Administered 2021-08-20 (×2): 40 ug via INTRAVENOUS

## 2021-08-20 MED ORDER — PROPOFOL 500 MG/50ML IV EMUL
INTRAVENOUS | Status: DC | PRN
Start: 2021-08-20 — End: 2021-08-20
  Administered 2021-08-20: 150 ug/kg/min via INTRAVENOUS

## 2021-08-20 MED ORDER — BUTAMBEN-TETRACAINE-BENZOCAINE 2-2-14 % EX AERO
INHALATION_SPRAY | CUTANEOUS | Status: DC | PRN
  Administered 2021-08-20: 1 via TOPICAL

## 2021-08-20 MED ORDER — PROPOFOL 10 MG/ML IV BOLUS
INTRAVENOUS | Status: DC | PRN
  Administered 2021-08-20: 20 mg via INTRAVENOUS

## 2021-08-20 MED ORDER — LACTATED RINGERS IV SOLN: INTRAVENOUS | Status: DC

## 2021-08-20 NOTE — Anesthesia Postprocedure Evaluation (Signed)
Anesthesia Post Note ? ?Patient: Paul Fry ? ?Procedure(s) Performed: TRANSESOPHAGEAL ECHOCARDIOGRAM (TEE) ? ?  ? ?Patient location during evaluation: PACU ?Anesthesia Type: MAC ?Level of consciousness: awake and alert, oriented and patient cooperative ?Pain management: pain level controlled ?Vital Signs Assessment: post-procedure vital signs reviewed and stable ?Respiratory status: spontaneous breathing, nonlabored ventilation and respiratory function stable ?Cardiovascular status: blood pressure returned to baseline and stable ?Postop Assessment: no apparent nausea or vomiting ?Anesthetic complications: no ? ? ?No notable events documented. ? ?Last Vitals:  ?Vitals:  ? 08/20/21 1253 08/20/21 1255  ?BP: 109/65 109/65  ?Pulse: 72 71  ?Resp: 14 13  ?Temp: 36.5 ?C   ?SpO2: 96% 97%  ?  ?Last Pain:  ?Vitals:  ? 08/20/21 1253  ?TempSrc: Temporal  ?PainSc: 0-No pain  ? ? ?  ?  ?  ?  ?  ?  ? ?Paul Fry ? ? ? ? ?

## 2021-08-20 NOTE — Interval H&P Note (Signed)
History and Physical Interval Note: ? ?08/20/2021 ?3:46 PM ? ?Paul Fry  has presented today for surgery, with the diagnosis of POST WATCHMAN EVALUATION.  The various methods of treatment have been discussed with the patient and family. After consideration of risks, benefits and other options for treatment, the patient has consented to  Procedure(s): ?TRANSESOPHAGEAL ECHOCARDIOGRAM (TEE) (N/A) as a surgical intervention.  The patient's history has been reviewed, patient examined, no change in status, stable for surgery.  I have reviewed the patient's chart and labs.  Questions were answered to the patient's satisfaction.   ? ? ?[name] A [name] ? ? ?

## 2021-08-20 NOTE — Anesthesia Preprocedure Evaluation (Signed)
Anesthesia Evaluation  ?Patient identified by MRN, date of birth, ID band ?Patient awake ? ? ? ?Reviewed: ?Allergy & Precautions, NPO status , Patient's Chart, lab work & pertinent test results, reviewed documented beta blocker date and time  ? ?Airway ?Mallampati: II ? ?TM Distance: >3 FB ?Neck ROM: Full ? ? ? Dental ? ?(+) Caps, Dental Advisory Given ?  ?Pulmonary ?neg pulmonary ROS,  ?  ?Pulmonary exam normal ?breath sounds clear to auscultation ? ? ? ? ? ? Cardiovascular ?hypertension, Pt. on medications and Pt. on home beta blockers ?+ CAD and + DOE  ?+ dysrhythmias Atrial Fibrillation  ?Rhythm:Irregular Rate:Normal ? ?S/P Watchman procedure ? ?  ?Neuro/Psych ? Neuromuscular disease negative psych ROS  ? GI/Hepatic ?Neg liver ROS, hiatal hernia, GERD  Medicated and Controlled,  ?Endo/Other  ?diabetes, Well Controlled, Type 2, Oral Hypoglycemic Agents ? Renal/GU ?negative Renal ROS  ?negative genitourinary ?  ?Musculoskeletal ? ?(+) Arthritis , Osteoarthritis,   ? Abdominal ?  ?Peds ? Hematology ? ?(+) Blood dyscrasia, anemia , Xarelto therapy- last dose yesterday pm   ?Anesthesia Other Findings ? ? Reproductive/Obstetrics ? ?  ? ? ? ? ? ? ? ? ? ? ? ? ? ?  ?  ? ? ? ? ? ? ? ? ?Anesthesia Physical ?Anesthesia Plan ? ?ASA: 3 ? ?Anesthesia Plan: General  ? ?Post-op Pain Management:   ? ?Induction: Intravenous ? ?PONV Risk Score and Plan: 2 and Treatment may vary due to age or medical condition ? ?Airway Management Planned: Mask and Natural Airway ? ?Additional Equipment:  ? ?Intra-op Plan:  ? ?Post-operative Plan:  ? ?Informed Consent: I have reviewed the patients History and Physical, chart, labs and discussed the procedure including the risks, benefits and alternatives for the proposed anesthesia with the patient or authorized representative who has indicated his/her understanding and acceptance.  ? ? ? ?Dental advisory given ? ?Plan Discussed with: CRNA and  Anesthesiologist ? ?Anesthesia Plan Comments:   ? ? ? ? ? ? ?Anesthesia Quick Evaluation ? ?

## 2021-08-20 NOTE — Progress Notes (Signed)
Pt. Discharge time delayed due to Dr. Vevelyn Pat to do a bedside TEE, and Dr. Anthonette Legato to talk with pt. After Bedside TEE ?

## 2021-08-20 NOTE — Progress Notes (Signed)
?  Echocardiogram ?Echocardiogram Transesophageal has been performed. ? ?Paul Fry ?08/20/2021, 1:03 PM ?

## 2021-08-20 NOTE — CV Procedure (Signed)
? ? ?  TRANSESOPHAGEAL ECHOCARDIOGRAM  ? ?NAME:  Paul Fry    ?MRN: [id number] ?DOB:  August 24, 1945    ?ADMIT DATE: 08/20/2021 ? ?INDICATIONS: ?Post Watchman follow up ? ?PROCEDURE:  ? ?Informed consent was obtained prior to the procedure. The risks, benefits and alternatives for the procedure were discussed and the patient comprehended these risks.  Risks include, but are not limited to, cough, sore throat, vomiting, nausea, somnolence, esophageal and stomach trauma or perforation, bleeding, low blood pressure, aspiration, pneumonia, infection, trauma to the teeth and death.   ? ?Procedural time out performed. The oropharynx was anesthetized with topical 1% benzocaine.   ? ?Anesthesia was administered by Dr. Doroteo Glassman and team.   The patient's heart rate, blood pressure, and oxygen saturation are monitored continuously during the procedure.  ? ?The transesophageal probe was inserted in the esophagus and stomach without difficulty and multiple views were obtained.  ? ?COMPLICATIONS:   ? ?There were no immediate complications. ? ?KEY FINDINGS: ? ?Proximal migration of the FLX device ?Mild peri-device leak.  ?Full report to follow. ?Further management per primary team.  ? ?Rudean Haskell, MD ?[place]  ?2:24 PM ? ? ?

## 2021-08-20 NOTE — Transfer of Care (Signed)
Immediate Anesthesia Transfer of Care Note ? ?Patient: Paul Fry ? ?Procedure(s) Performed: TRANSESOPHAGEAL ECHOCARDIOGRAM (TEE) ? ?Patient Location: PACU ? ?Anesthesia Type:MAC ? ?Level of Consciousness: drowsy and patient cooperative ? ?Airway & Oxygen Therapy: Patient Spontanous Breathing and Patient connected to nasal cannula oxygen ? ?Post-op Assessment: Report given to RN and Post -op Vital signs reviewed and stable ? ?Post vital signs: Reviewed and stable ? ?Last Vitals:  ?Vitals Value Taken Time  ?BP 109/65 08/20/21 1252  ?Temp    ?Pulse 71 08/20/21 1254  ?Resp 13 08/20/21 1254  ?SpO2 97 % 08/20/21 1254  ?Vitals shown include unvalidated device data. ? ?Last Pain:  ?Vitals:  ? 08/20/21 1145  ?TempSrc: Temporal  ?   ? ?  ? ?Complications: No notable events documented. ?

## 2021-08-21 ENCOUNTER — Encounter (HOSPITAL_COMMUNITY): Payer: Self-pay | Admitting: Internal Medicine

## 2021-08-24 ENCOUNTER — Telehealth: Payer: Self-pay | Admitting: Cardiology

## 2021-08-24 ENCOUNTER — Other Ambulatory Visit: Payer: Self-pay

## 2021-08-24 DIAGNOSIS — Z95818 Presence of other cardiac implants and grafts: Secondary | ICD-10-CM

## 2021-08-24 MED ORDER — PANTOPRAZOLE SODIUM 40 MG PO TBEC
40.0000 mg | DELAYED_RELEASE_TABLET | Freq: Every day | ORAL | 3 refills | Status: DC
Start: 2021-08-24 — End: 2021-09-29

## 2021-08-24 MED ORDER — CLOPIDOGREL BISULFATE 75 MG PO TABS: 75.0000 mg | ORAL_TABLET | Freq: Every day | ORAL | 1 refills | Status: DC

## 2021-08-24 MED ORDER — CLOPIDOGREL BISULFATE 75 MG PO TABS: 75.0000 mg | ORAL_TABLET | Freq: Every day | ORAL | 0 refills | Status: DC

## 2021-08-24 MED ORDER — PANTOPRAZOLE SODIUM 40 MG PO TBEC: 40.0000 mg | DELAYED_RELEASE_TABLET | Freq: Every day | ORAL | 0 refills | Status: DC

## 2021-08-24 NOTE — Telephone Encounter (Signed)
Patient called in to say that he has some leakage. Please advise  ?

## 2021-08-24 NOTE — Telephone Encounter (Signed)
-----   Message from Vickie Epley, MD sent at 08/22/2021  9:27 AM EDT ----- ?Regarding: RE: samll device leak ?Reviewed. ? ?OK to transition to Plavix monotherapy.  ? ?I'd like to repeat a TEE at the 6 month mark after Watchman implant. Katy/Jill, can you get this ordered. ? ?Thanks, ? ?Lars Mage ? ? ? ? ?----- Message ----- ?From: Tommie Raymond, NP ?Sent: 08/21/2021  10:15 AM EDT ?To: Theodoro Parma, RN, Vickie Epley, MD ?Subject: samll device leak                             ? ?Dr. Quentin Ore, can you look at the TEE on this guy before we make medication changes?  ? ?Per report: Follow up from Watchman 31 mm FLX device. There is proximal migration  ?of the device within the left atrial appendage. There is a small, 1.85 mm,  ?peri-device leak. There is an increased mitral shoulder of the device from  ?prior.  ? ?Thanks ?Sharee Pimple  ? ? ?

## 2021-08-24 NOTE — Telephone Encounter (Signed)
Reviewed results with patient who verbalized understanding.   ?Instructed patient to STOP XARELTO and START PLAVIX 75 mg daily. ?6 mo follow-up scheduled 12/30/2021. ?6 mo TEE scheduled 01/08/2022. ?The patient was grateful for call and agrees with plan.  ?

## 2021-08-24 NOTE — Telephone Encounter (Signed)
Patient had a TEE on 4/14 for watchman follow up. He would like to know if he needs to continue on xarelto. He states that they found a small leakage.  ?Per Dr. Mardene Speak note: ?PLAN:   ?  ?#Watchman in situ ?Currently on xarelto. Follow up TEE 4/13. If TEE meets criteria, OK to stop xarelto and transition to plavix monotherapy. ? ? ?Will forward to him for further advisement on patient's medications. ?

## 2021-08-25 ENCOUNTER — Telehealth: Payer: Self-pay | Admitting: *Deleted

## 2021-08-25 ENCOUNTER — Encounter: Payer: Self-pay | Admitting: *Deleted

## 2021-08-25 DIAGNOSIS — T884XXA Failed or difficult intubation, initial encounter: Secondary | ICD-10-CM

## 2021-08-25 HISTORY — DX: Failed or difficult intubation, initial encounter: T88.4XXA

## 2021-08-25 NOTE — Telephone Encounter (Signed)
Dr Fuller Plan, ? ?This pt is scheduled for a recall colon for a 20 mm piecemeal polypectomy 01-05-2021 ?He has a documented difficult intubation as per note below- do you want direct at [facility]? He had an ablation 05-2021 and a Watchman 07-09-2021, he is now on Plavix per Cardio note- I have sent a hold to your office Nurse Donita Brooks for this  ? ?Please advise-  last OV 12-04-2020 ? ?Paul Fry PV  ? ?Procedure Name: Intubation ?Date/Time: 05/15/2021 7:52 AM ?Performed by: Jenne Campus, CRNA ?Pre-anesthesia Checklist: Patient identified, Emergency Drugs available, Suction available and Patient being monitored ?Patient Re-evaluated:Patient Re-evaluated prior to induction ?Oxygen Delivery Method: Circle System Utilized ?Preoxygenation: Pre-oxygenation with 100% oxygen ?Induction Type: IV induction ?Ventilation: Mask ventilation without difficulty ?Laryngoscope Size: Glidescope and 4 ?Grade View: Grade I ?Tube type: Oral ?Tube size: 7.5 mm ?Number of attempts: 1 ?Airway Equipment and Method: Stylet and Oral airway ?Placement Confirmation: ETT inserted through vocal cords under direct vision, positive ETCO2 and breath sounds checked- equal and bilateral ?Secured at: 22 cm ?Tube secured with: Tape ?Dental Injury: Teeth and Oropharynx as per pre-operative assessment  ?Difficulty Due To: Difficult Airway- due to anterior larynx ?Comments: Smooth IV induction. Easy mask. DL x 1 Mil 3 CRNA. Grade 3 view. DL x 2 Mil 3, MAC 4 Grade 3 view. DL x 1 Video glide 4, Grade 1 view. 7.5 ETT easily passed through cords.  +ETCO2. BBSE. ?

## 2021-08-25 NOTE — Telephone Encounter (Signed)
Please cancel [place] colonoscopy, schedule an office appt and we will reschedule his colonoscopy at the hospital.  ?

## 2021-08-25 NOTE — Telephone Encounter (Signed)
The Xarelto on this pt was changed to Plavix at the 4-7 Cardiology OV- he had a TEE 08-20-2021. After reviewing all notes, we do not have a hold for Plavix for his 6-6 Colon- , can you please obtain a hold for his upcoming procedure ? ?Thanks so much Lelan Pons PV   ?

## 2021-08-25 NOTE — Telephone Encounter (Signed)
Pt has been scheduled an OV due to Difficult intubation and blood thinners per DR Fuller Plan,  ?

## 2021-08-25 NOTE — Telephone Encounter (Signed)
The pre visit and colon have been cancelled and office visit scheduled.  The pt advised and agrees to office visit to discuss.  ?

## 2021-09-29 ENCOUNTER — Ambulatory Visit (INDEPENDENT_AMBULATORY_CARE_PROVIDER_SITE_OTHER): Payer: Medicare Other | Admitting: Gastroenterology

## 2021-09-29 ENCOUNTER — Telehealth: Payer: Self-pay

## 2021-09-29 ENCOUNTER — Encounter: Payer: Self-pay | Admitting: Gastroenterology

## 2021-09-29 VITALS — BP 100/64 | HR 62 | Ht 72.0 in | Wt 197.0 lb

## 2021-09-29 DIAGNOSIS — Z86010 Personal history of colonic polyps: Secondary | ICD-10-CM

## 2021-09-29 DIAGNOSIS — Z7901 Long term (current) use of anticoagulants: Secondary | ICD-10-CM | POA: Diagnosis not present

## 2021-09-29 DIAGNOSIS — Z800 Family history of malignant neoplasm of digestive organs: Secondary | ICD-10-CM | POA: Diagnosis not present

## 2021-09-29 DIAGNOSIS — Z860101 Personal history of adenomatous and serrated colon polyps: Secondary | ICD-10-CM

## 2021-09-29 MED ORDER — PLENVU 140 G PO SOLR: 1.0000 | Freq: Once | ORAL | 0 refills | Status: AC

## 2021-09-29 NOTE — Telephone Encounter (Signed)
Dr. Quentin Ore, is it reasonable to hold Plavix x 5 days prior to colonoscopy scheduled for 11/19/21?

## 2021-09-29 NOTE — Progress Notes (Addendum)
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 TEE April 2023 EF 55-60% DM Paroxysmal afib S/P ablation in Jan 2023 on Plavix    Recommendations    Schedule colonoscopy at [facility], difficult intubation, pending review to determine if he qualifies for anesthesia care at the [facility]. Will change to [place] if he qualifies. The risks (including bleeding, perforation, infection, missed lesions, medication reactions and possible hospitalization or surgery if complications occur), benefits, and alternatives to colonoscopy with possible biopsy and possible polypectomy were discussed with the patient and they consent to proceed.  MiraLAX once or twice daily titrated for adequate daily bowel movements Hold Plavix 5 Plavix days before procedure - will instruct when and how to resume after procedure. Low but real risk of cardiovascular event such as heart attack, stroke, embolism, thrombosis or ischemia/infarct of other organs off Plavix explained and need to seek urgent help if this occurs. The patient consents to proceed. Will communicate by phone or EMR with patient's prescribing provider to confirm that holding Plavix is reasonable in this case.   Continue FeSO4 325 mg po qod, PCP follow up on anemia and hold iron for 5 days prior to colonoscopy.   HPI    This is a 76 year old male who underwent piecemeal polypectomy of a cecal tubulovillous adenoma in August 2022.  He is maintained on Plavix.  He underwent watchman placement and A-fib ablation.  He is taking iron for IDA and notes increased constipation.  His reflux symptoms are currently diet controlled. Colonoscopy and EGD in August 2022 for IDA showed an esophageal AVM as a potential source.  No other  gastrointestinal complaints.   Labs / Imaging       Latest Ref Rng & Units 10/16/2020    9:04 AM 10/16/2019    9:43 AM 07/27/2018    8:57 AM  Hepatic Function  Total Protein 6.0 - 8.3 g/dL 7.3   6.8   7.4    Albumin 3.5 - 5.2 g/dL 4.1   4.0   4.0    AST 0 - 37 U/L _0 ALT 0 - 53 U/L 19   17   33    Alk Phosphatase 39 - 117 U/L 50   56   62    Total Bilirubin 0.2 - 1.2 mg/dL 0.7   0.7   0.7         Latest Ref Rng & Units 07/27/2021   11:35 AM 06/12/2021   10:55 AM 04/23/2021    8:21 AM  CBC  WBC 3.4 - 10.8 x10E3/uL 6.8   5.0   5.8    Hemoglobin 13.0 - 17.7 g/dL 11.3   11.6   12.4    Hematocrit 37.5 - 51.0 % 34.5   36.1   37.3    Platelets 150 - 450 x10E3/uL 346   311   324      Current Medications, Allergies, Past Medical History, Past Surgical History, Family History and Social History were reviewed in [employer] record.   Physical Exam: General: Well developed, well nourished, no acute distress Head: Normocephalic and atraumatic Eyes: Sclerae anicteric, EOMI Ears: Normal auditory acuity Mouth: Not examined, mask on during Covid-19 pandemic Lungs: Clear throughout to auscultation Heart:  Regular rate and rhythm; no murmurs, rubs or bruits Abdomen: Soft, non tender and non distended. No masses, hepatosplenomegaly or hernias noted. Normal Bowel sounds Rectal: Deferred to colonoscopy  Musculoskeletal: Symmetrical with no gross deformities  Pulses:  Normal pulses noted Extremities: No clubbing, cyanosis, edema or deformities noted Neurological: Alert oriented x 4, grossly nonfocal Psychological:  Alert and cooperative. Normal mood and affect   Wessley Emert T. Fuller Plan, MD 09/29/2021, 9:09 AM

## 2021-09-29 NOTE — Telephone Encounter (Signed)
[facility] Pre-operative Risk Assessment     Request for surgical clearance:     Endoscopy Procedure  What type of surgery is being performed?     colonoscopy  When is this surgery scheduled?     11/19/21  What type of clearance is required ?   Pharmacy  Are there any medications that need to be held prior to surgery and how long? Plavix x 5 days  Practice name and name of physician performing surgery?      [facility]  What is your office phone and fax number?      Phone- [phone]  [phone]  Anesthesia type (None, local, MAC, general) ?       MAC

## 2021-09-29 NOTE — Patient Instructions (Signed)
Take over the counter Miralax mixing 17 grams in 8 oz of water daily for constipation.   You have been scheduled for a colonoscopy. Please follow written instructions given to you at your visit today.  Please pick up your prep supplies at the pharmacy within the next 1-3 days. If you use inhalers (even only as needed), please bring them with you on the day of your procedure.  The [facility] providers would like to encourage you to use [facility] to communicate with providers for non-urgent requests or questions.  Due to long hold times on the telephone, sending your provider a message by [facility] may be a faster and more efficient way to get a response.  Please allow 48 business hours for a response.  Please remember that this is for non-urgent requests.   Due to recent changes in healthcare laws, you may see the results of your imaging and laboratory studies on MyChart before your provider has had a chance to review them.  We understand that in some cases there may be results that are confusing or concerning to you. Not all laboratory results come back in the same time frame and the provider may be waiting for multiple results in order to interpret others.  Please give [place] 48 hours in order for your provider to thoroughly review all the results before contacting the office for clarification of your results.   Thank you for choosing me and [facility].  Pricilla Riffle. Dagoberto Ligas., MD., Marval Regal

## 2021-09-30 ENCOUNTER — Telehealth: Payer: Self-pay | Admitting: *Deleted

## 2021-09-30 NOTE — Telephone Encounter (Signed)
   Primary Cardiologist: Candee Furbish, MD  Chart reviewed as part of pre-operative protocol coverage. Given past medical history and time since last visit, based on ACC/AHA guidelines, Paul Fry would be at acceptable risk for the planned procedure without further cardiovascular testing.   His Plavix may be held for 5 days prior to his procedure.  Please resume as soon as hemostasis is achieved.  I will route this recommendation to the requesting party via Epic fax function and remove from pre-op pool.  Please call with questions.  [name]. [name] NP-C    09/30/2021, 12:17 PM [place] [address] Office [phone] Fax [phone]

## 2021-09-30 NOTE — Telephone Encounter (Signed)
Informed patient he can hold his Plavix 5 days prior to his procedure per Dr. Quentin Ore. Patient verbalized understanding.

## 2021-10-13 ENCOUNTER — Encounter: Payer: Medicare Other | Admitting: Gastroenterology

## 2021-10-15 DIAGNOSIS — Z961 Presence of intraocular lens: Secondary | ICD-10-CM | POA: Diagnosis not present

## 2021-10-15 DIAGNOSIS — H5213 Myopia, bilateral: Secondary | ICD-10-CM | POA: Diagnosis not present

## 2021-10-15 DIAGNOSIS — H52203 Unspecified astigmatism, bilateral: Secondary | ICD-10-CM | POA: Diagnosis not present

## 2021-10-15 LAB — HM DIABETES EYE EXAM

## 2021-10-22 ENCOUNTER — Telehealth: Payer: Self-pay | Admitting: Cardiology

## 2021-10-22 DIAGNOSIS — R0609 Other forms of dyspnea: Secondary | ICD-10-CM

## 2021-10-22 NOTE — Telephone Encounter (Signed)
Pt c/o Shortness Of Breath: STAT if SOB developed within the last 24 hours or pt is noticeably SOB on the phone  1. Are you currently SOB (can you hear that pt is SOB on the phone)? No   2. How long have you been experiencing SOB? Since first of the month  3. Are you SOB when sitting or when up moving around? Moving around, like carry a bucket  4. Are you currently experiencing any other symptoms? No

## 2021-10-22 NOTE — Telephone Encounter (Signed)
Spoke with patient who reports he has noticed he has some SOB when walking up his driveway (which is on an incline), especially when carrying an object such as a bucket. He states this started at the beginning of this month and has not been an issue in the past.  Patient reports he recovers quickly from SOB once he is done walking on the incline of his driveway, also states he is able to push through the SOB and it does not cause him to have to stop and rest.  Patient denies any CP, dizziness, fatigue, swelling. Also denies any SOB at rest or with other activities.  Will forward to Dr. Quentin Ore to review.

## 2021-10-23 ENCOUNTER — Encounter: Payer: Self-pay | Admitting: *Deleted

## 2021-10-23 ENCOUNTER — Other Ambulatory Visit
Admission: RE | Admit: 2021-10-23 | Discharge: 2021-10-23 | Disposition: A | Discharge status: Home / Self Care | Payer: Medicare Other | Attending: Cardiology | Admitting: Cardiology

## 2021-10-23 DIAGNOSIS — R0609 Other forms of dyspnea: Secondary | ICD-10-CM | POA: Diagnosis not present

## 2021-10-23 LAB — CBC
HCT: 34.9 % — ABNORMAL LOW (ref 39.0–52.0)
Hemoglobin: 11.1 g/dL — ABNORMAL LOW (ref 13.0–17.0)
MCH: 26.3 pg (ref 26.0–34.0)
MCHC: 31.8 g/dL (ref 30.0–36.0)
MCV: 82.7 fL (ref 80.0–100.0)
Platelets: 318 10*3/uL (ref 150–400)
RBC: 4.22 MIL/uL (ref 4.22–5.81)
RDW: 20.3 % — ABNORMAL HIGH (ref 11.5–15.5)
WBC: 4.7 10*3/uL (ref 4.0–10.5)
nRBC: 0.6 % — ABNORMAL HIGH (ref 0.0–0.2)

## 2021-10-23 NOTE — Telephone Encounter (Signed)
  Order a stress test (lexiscan) to check on his shortness of breath? Lets also get a 2 view chest  xray. CBC because of hx of GI bleed. Thanks!  Paul Fry   Patient verbalized understanding and agreement. Will send instructions over mychart and patient is aware office will call to set up stress test. The patient is in [place] right now, will set up labs for [facility].

## 2021-10-26 NOTE — Addendum Note (Signed)
Addended by: Lars Mage T on: 10/26/2021 02:17 PM   Modules accepted: Orders

## 2021-10-26 NOTE — Addendum Note (Signed)
Addended by: Saddie Benders E on: 10/26/2021 11:58 AM   Modules accepted: Orders

## 2021-10-27 ENCOUNTER — Ambulatory Visit
Admission: RE | Admit: 2021-10-27 | Discharge: 2021-10-27 | Disposition: A | Discharge status: Home / Self Care | Payer: Medicare Other | Source: Ambulatory Visit | Attending: Cardiology | Admitting: Cardiology

## 2021-10-27 DIAGNOSIS — R0609 Other forms of dyspnea: Secondary | ICD-10-CM

## 2021-10-27 DIAGNOSIS — R0602 Shortness of breath: Secondary | ICD-10-CM | POA: Diagnosis not present

## 2021-11-03 ENCOUNTER — Telehealth (HOSPITAL_COMMUNITY): Payer: Self-pay | Admitting: *Deleted

## 2021-11-03 ENCOUNTER — Encounter (HOSPITAL_COMMUNITY): Payer: Self-pay | Admitting: *Deleted

## 2021-11-04 ENCOUNTER — Ambulatory Visit (INDEPENDENT_AMBULATORY_CARE_PROVIDER_SITE_OTHER): Payer: Medicare Other

## 2021-11-04 ENCOUNTER — Other Ambulatory Visit: Payer: Self-pay | Admitting: Family Medicine

## 2021-11-04 VITALS — Ht 72.0 in | Wt 197.0 lb

## 2021-11-04 DIAGNOSIS — Z0000 Encounter for general adult medical examination without abnormal findings: Secondary | ICD-10-CM | POA: Diagnosis not present

## 2021-11-04 NOTE — Patient Instructions (Signed)
[name] , Thank you for taking time to come for your Medicare Wellness Visit. I appreciate your ongoing commitment to your health goals. Please review the following plan we discussed and let me know if I can assist you in the future.   These are the goals we discussed:  Goals      Patient Stated     Starting 10/09/2018, I will continue to take medications as prescribed.      Patient Stated     10/16/2019, I will maintain and continue medications as prescribed.         This is a list of the screening recommended for you and due dates:  Health Maintenance  Topic Date Due   Zoster (Shingles) Vaccine (1 of 2) Never done   Tetanus Vaccine  09/19/2018   Eye exam for diabetics  10/10/2020   COVID-19 Vaccine (5 - Booster for Moderna series) 10/21/2020   Flu Shot  12/08/2021   Colon Cancer Screening  01/05/2022   Hemoglobin A1C  01/30/2022   Complete foot exam   07/31/2022   Pneumonia Vaccine  Completed   Hepatitis C Screening: USPSTF Recommendation to screen - Ages 18-79 yo.  Completed   HPV Vaccine  Aged Out   Advanced directives: Yes  Conditions/risks identified: None  Next appointment: Follow up in one year for your annual wellness visit.    Preventive Care 76 Years and Older, Male Preventive care refers to lifestyle choices and visits with your health care provider that can promote health and wellness. What does preventive care include? A yearly physical exam. This is also called an annual well check. Dental exams once or twice a year. Routine eye exams. Ask your health care provider how often you should have your eyes checked. Personal lifestyle choices, including: Daily care of your teeth and gums. Regular physical activity. Eating a healthy diet. Avoiding tobacco and drug use. Limiting alcohol use. Practicing safe sex. Taking low doses of aspirin every day. Taking vitamin and mineral supplements as recommended by your health care provider. What happens during an  annual well check? The services and screenings done by your health care provider during your annual well check will depend on your age, overall health, lifestyle risk factors, and family history of disease. Counseling  Your health care provider may ask you questions about your: Alcohol use. Tobacco use. Drug use. Emotional well-being. Home and relationship well-being. Sexual activity. Eating habits. History of falls. Memory and ability to understand (cognition). Work and work Statistician. Screening  You may have the following tests or measurements: Height, weight, and BMI. Blood pressure. Lipid and cholesterol levels. These may be checked every 5 years, or more frequently if you are over 36 years old. Skin check. Lung cancer screening. You may have this screening every year starting at age 7 if you have a 30-pack-year history of smoking and currently smoke or have quit within the past 15 years. Fecal occult blood test (FOBT) of the stool. You may have this test every year starting at age 68. Flexible sigmoidoscopy or colonoscopy. You may have a sigmoidoscopy every 5 years or a colonoscopy every 10 years starting at age 64. Prostate cancer screening. Recommendations will vary depending on your family history and other risks. Hepatitis C blood test. Hepatitis B blood test. Sexually transmitted disease (STD) testing. Diabetes screening. This is done by checking your blood sugar (glucose) after you have not eaten for a while (fasting). You may have this done every 1-3 years. Abdominal aortic  aneurysm (AAA) screening. You may need this if you are a current or former smoker. Osteoporosis. You may be screened starting at age 54 if you are at high risk. Talk with your health care provider about your test results, treatment options, and if necessary, the need for more tests. Vaccines  Your health care provider may recommend certain vaccines, such as: Influenza vaccine. This is recommended  every year. Tetanus, diphtheria, and acellular pertussis (Tdap, Td) vaccine. You may need a Td booster every 10 years. Zoster vaccine. You may need this after age 47. Pneumococcal 13-valent conjugate (PCV13) vaccine. One dose is recommended after age 74. Pneumococcal polysaccharide (PPSV23) vaccine. One dose is recommended after age 24. Talk to your health care provider about which screenings and vaccines you need and how often you need them. This information is not intended to replace advice given to you by your health care provider. Make sure you discuss any questions you have with your health care provider. Document Released: 05/23/2015 Document Revised: 01/14/2016 Document Reviewed: 02/25/2015 Elsevier Interactive Patient Education  2017 [employer] Prevention in the Home Falls can cause injuries. They can happen to people of all ages. There are many things you can do to make your home safe and to help prevent falls. What can I do on the outside of my home? Regularly fix the edges of walkways and driveways and fix any cracks. Remove anything that might make you trip as you walk through a door, such as a raised step or threshold. Trim any bushes or trees on the path to your home. Use bright outdoor lighting. Clear any walking paths of anything that might make someone trip, such as rocks or tools. Regularly check to see if handrails are loose or broken. Make sure that both sides of any steps have handrails. Any raised decks and porches should have guardrails on the edges. Have any leaves, snow, or ice cleared regularly. Use sand or salt on walking paths during winter. Clean up any spills in your garage right away. This includes oil or grease spills. What can I do in the bathroom? Use night lights. Install grab bars by the toilet and in the tub and shower. Do not use towel bars as grab bars. Use non-skid mats or decals in the tub or shower. If you need to sit down in the shower,  use a plastic, non-slip stool. Keep the floor dry. Clean up any water that spills on the floor as soon as it happens. Remove soap buildup in the tub or shower regularly. Attach bath mats securely with double-sided non-slip rug tape. Do not have throw rugs and other things on the floor that can make you trip. What can I do in the bedroom? Use night lights. Make sure that you have a light by your bed that is easy to reach. Do not use any sheets or blankets that are too big for your bed. They should not hang down onto the floor. Have a firm chair that has side arms. You can use this for support while you get dressed. Do not have throw rugs and other things on the floor that can make you trip. What can I do in the kitchen? Clean up any spills right away. Avoid walking on wet floors. Keep items that you use a lot in easy-to-reach places. If you need to reach something above you, use a strong step stool that has a grab bar. Keep electrical cords out of the way. Do not use floor  polish or wax that makes floors slippery. If you must use wax, use non-skid floor wax. Do not have throw rugs and other things on the floor that can make you trip. What can I do with my stairs? Do not leave any items on the stairs. Make sure that there are handrails on both sides of the stairs and use them. Fix handrails that are broken or loose. Make sure that handrails are as long as the stairways. Check any carpeting to make sure that it is firmly attached to the stairs. Fix any carpet that is loose or worn. Avoid having throw rugs at the top or bottom of the stairs. If you do have throw rugs, attach them to the floor with carpet tape. Make sure that you have a light switch at the top of the stairs and the bottom of the stairs. If you do not have them, ask someone to add them for you. What else can I do to help prevent falls? Wear shoes that: Do not have high heels. Have rubber bottoms. Are comfortable and fit you  well. Are closed at the toe. Do not wear sandals. If you use a stepladder: Make sure that it is fully opened. Do not climb a closed stepladder. Make sure that both sides of the stepladder are locked into place. Ask someone to hold it for you, if possible. Clearly mark and make sure that you can see: Any grab bars or handrails. First and last steps. Where the edge of each step is. Use tools that help you move around (mobility aids) if they are needed. These include: Canes. Walkers. Scooters. Crutches. Turn on the lights when you go into a dark area. Replace any light bulbs as soon as they burn out. Set up your furniture so you have a clear path. Avoid moving your furniture around. If any of your floors are uneven, fix them. If there are any pets around you, be aware of where they are. Review your medicines with your doctor. Some medicines can make you feel dizzy. This can increase your chance of falling. Ask your doctor what other things that you can do to help prevent falls. This information is not intended to replace advice given to you by your health care provider. Make sure you discuss any questions you have with your health care provider. Document Released: 02/20/2009 Document Revised: 10/02/2015 Document Reviewed: 05/31/2014 Elsevier Interactive Patient Education  2017 [employer].

## 2021-11-04 NOTE — Progress Notes (Addendum)
Subjective:   Paul Fry is a 76 y.o. male who presents for Medicare Annual/Subsequent preventive examination.  Review of Systems    Virtual Visit via Telephone Note  I connected with  [place] on 11/04/21 at  9:15 AM EDT by telephone and verified that I am speaking with the correct person using two identifiers.  Location: Patient: Home Provider: Office Persons participating in the virtual visit: patient/Nurse Health Advisor   I discussed the limitations, risks, security and privacy concerns of performing an evaluation and management service by telephone and the availability of in person appointments. The patient expressed understanding and agreed to proceed.  Interactive audio and video telecommunications were attempted between this nurse and patient, however failed, due to patient having technical difficulties OR patient did not have access to video capability.  We continued and completed visit with audio only.  Some vital signs may be absent or patient reported.   Criselda Peaches, LPN  Cardiac Risk Factors include: advanced age (>85mn, >>83women);diabetes mellitus;male gender;hypertension     Objective:    Today's Vitals   11/04/21 0911  Weight: 197 lb (89.4 kg)  Height: 6' (1.829 m)   Body mass index is 26.72 kg/m.     11/04/2021    9:20 AM 08/20/2021   11:41 AM 07/09/2021    6:23 AM 05/15/2021    5:46 AM 10/16/2019   10:31 AM 12/20/2018    9:47 AM 10/09/2018    9:41 AM  Advanced Directives  Does Patient Have a Medical Advance Directive? Yes Yes Yes Yes Yes Yes Yes  Type of AParamed[place]of [place]Living will   [place] Point[place] will [place]Atl[place]Living will  [place] will  Does patient want to make changes to medical advance directive? No - Patient declined   No - Patient declined  No - Patient declined   Copy of [place] Chart? No - copy requested    No - copy requested No - copy requested  No - copy requested    Current Medications (verified) Outpatient Encounter Medications as of 11/04/2021  Medication Sig   amoxicillin (AMOXIL) 500 MG capsule Take 4 capsules (2,000 mg total) by mouth as directed. Take 4 tablets 1 hour prior to dental work, including cleanings.   ascorbic acid (VITAMIN C) 1000 MG tablet Take 500 mg by mouth daily.   atorvastatin (LIPITOR) 20 MG tablet Take 1 tablet (20 mg total) by mouth every morning.   CALCIUM-VITAMIN D PO Take 1 tablet by mouth daily.   clopidogrel (PLAVIX) 75 MG tablet Take 1 tablet (75 mg total) by mouth daily.   Iron, Ferrous Sulfate, 325 (65 Fe) MG TABS Take 325 mg by mouth every other day.   losartan (COZAAR) 25 MG tablet Take 0.5 tablets (12.5 mg total) by mouth every morning.   metoprolol succinate (TOPROL-XL) 50 MG 24 hr tablet Take 1 tablet (50 mg total) by mouth daily. Take with or immediately following a meal.   Multiple Vitamin (MULTIVITAMIN) tablet Take 1 tablet by mouth daily.   tamsulosin (FLOMAX) 0.4 MG CAPS capsule Take 2 capsules (0.8 mg total) by mouth daily.   No facility-administered encounter medications on file as of 11/04/2021.    Allergies (verified) Aspirin   History: Past Medical History:  Diagnosis Date   Allergy    SEASONAL   Anemia    Arthritis    KNEES   BPH (benign prostatic hyperplasia)  Difficult airway for intubation    Diverticulosis of colon    Elevated PSA    1.22 on 01-20-2016   Feeling of incomplete bladder emptying    GERD (gastroesophageal reflux disease)    Hiatal hernia    History of adenomatous polyp of colon    2014 tubular adenoma   History of chronic gastritis    History of Helicobacter pylori infection    07/ 2016   History of lower GI bleeding    07/ 2010 and 12/ 2011  diverticular bleed both times   Hyperlipidemia    Hypertension    Lower urinary tract symptoms (LUTS)    Nonischemic cardiomyopathy (HCC)    Plantar fascia  syndrome    Presence of Watchman left atrial appendage closure device 07/09/2021   Watchman FLX 30m with Dr. LQuentin Ore  Strains to urinate    INTERMITTANT   Type 2 diabetes, diet controlled ([place]    ON BORDER LINE,NEVER TOOK MEDICATION.   Wears glasses    Past Surgical History:  Procedure Laterality Date   APPENDECTOMY  teenager   ATRIAL FIBRILLATION ABLATION N/A 05/15/2021   Procedure: ATRIAL FIBRILLATION ABLATION;  Surgeon: LVickie Epley MD;  [facility];  Service: Cardiovascular;  Laterality: N/A;   CARDIAC CATHETERIZATION  09-10-2008   dr dalton mclean   mild non-obstructive CAD;  30-40% mRCA, 20%pLAD,  ef 55%   CARDIOVASCULAR STRESS TEST  07/29/2011   Low risk nuclear study w/ no ischemia or infarct/  LVEF 47% and  apical hypokinesis (08-07-2012 cardiac MRI -- EF 49% w/ no hyperenhancement distal septal and apical hypokinesis, mild LAE, mild RVE, mild AR with mild dilated sinus 367m   CATARACT EXTRACTION W/ INTRAOCULAR LENS  IMPLANT, BILATERAL  2016   COLONOSCOPY  last one 12-21-2012   CYSTOSCOPY WITH INSERTION OF UROLIFT N/A 06/17/2016   Procedure: CYSTOSCOPY WITH INSERTION OF UROLIFT;  Surgeon: SiCarolan ClinesMD;  [facility] Service: Urology;  Laterality: N/A;   ESOPHAGOGASTRODUODENOSCOPY  last one 11-22-2014   LEFT ATRIAL APPENDAGE OCCLUSION N/A 07/09/2021   Procedure: LEFT ATRIAL APPENDAGE OCCLUSION;  Surgeon: LaVickie EpleyMD;  [facility];  Service: Cardiovascular;  Laterality: N/A;   TEE WITHOUT CARDIOVERSION N/A 07/09/2021   Procedure: TRANSESOPHAGEAL ECHOCARDIOGRAM (TEE);  Surgeon: LaVickie EpleyMD;  [facility];  Service: Cardiovascular;  Laterality: N/A;   TEE WITHOUT CARDIOVERSION N/A 08/20/2021   Procedure: TRANSESOPHAGEAL ECHOCARDIOGRAM (TEE);  Surgeon: ChWerner LeanMD;  [facility];  Service: Cardiovascular;  Laterality: N/A;   TRANSTHORACIC  ECHOCARDIOGRAM  09/17/2014   ef 50-55%/  trivial AR, MR and PR/  mild LAE and RAE/  mild TR   Family History  Problem Relation Age of Onset   Colon cancer Mother 7521 Heart disease Sister        MI, pace maker   Kidney disease Sister        renal failure, nephrectomy- not cancer   Hypertension Sister    Hypertension Sister    Colon polyps Brother    Lung cancer Brother    Diabetes Neg Hx        DM   Depression Neg Hx    Stroke Neg Hx    Alcohol abuse Neg Hx        also no drug abuse    Prostate cancer Neg Hx    Liver cancer Neg Hx    Rectal cancer Neg  Hx    Stomach cancer Neg Hx    Social History   Socioeconomic History   Marital status: Married    Spouse name: Not on file   Number of children: 0   Years of education: Not on file   Highest education level: Not on file  Occupational History   Occupation: truck driver- retired    Fish farm manager: RETIRED  Tobacco Use   Smoking status: Never   Smokeless tobacco: Never  Vaping Use   Vaping Use: Never used  Substance and Sexual Activity   Alcohol use: Not Currently    Comment: rarely drinks beer   Drug use: No   Sexual activity: Not Currently  Other Topics Concern   Not on file  Social History Narrative   Married 2003, no children.    Retired Administrator, gets regular exercise- walking.    Step daughter is MD in [place]   Social Determinants of Health   Financial Resource Strain: Low Risk  (11/04/2021)   Overall Financial Resource Strain (CARDIA)    Difficulty of Paying Living Expenses: Not hard at all  Food Insecurity: No Food Insecurity (11/04/2021)   Hunger Vital Sign    Worried About Running Out of Food in the Last Year: Never true    Ran Out of Food in the Last Year: Never true  Transportation Needs: No Transportation Needs (11/04/2021)   PRAPARE - Hydrologist (Medical): No    Lack of Transportation (Non-Medical): No  Physical Activity: Insufficiently Active (11/04/2021)   Exercise  Vital Sign    Days of Exercise per Week: 4 days    Minutes of Exercise per Session: 20 min  Stress: No Stress Concern Present (11/04/2021)   [place]    Feeling of Stress : Not at all  Social Connections: [place] (11/04/2021)   Social Connection and Isolation Panel [NHANES]    Frequency of Communication with Friends and Family: Three times a week    Frequency of Social Gatherings with Friends and Family: Once a week    Attends Religious Services: More than 4 times per year    Active Member of [employer] or Organizations: Yes    Attends Archivist Meetings: 1 to 4 times per year    Marital Status: Married    Tobacco Counseling Counseling given: Not Answered   Clinical Intake:  Pre-visit preparation completed: Yes  Pain : No/denies pain     BMI - recorded: 26.71 Nutritional Status: BMI 25 -29 Overweight Nutritional Risks: None Diabetes: Yes (Pre Diabetic) CBG done?: No Did pt. bring in CBG monitor from home?: No  How often do you need to have someone help you when you read instructions, pamphlets, or other written materials from your doctor or pharmacy?: 1 - Never  Diabetic?  Yes Pre Diabetic  Interpreter Needed?: No Activities of Daily Living    11/04/2021    9:17 AM 11/03/2021    7:05 PM  In your present state of health, do you have any difficulty performing the following activities:  Hearing? 0 0  Vision? 0 0  Difficulty concentrating or making decisions? 0 0  Walking or climbing stairs? 0 0  Dressing or bathing? 0 0  Doing errands, shopping? 0 0  Preparing Food and eating ? N N  Using the Toilet? N N  In the past six months, have you accidently leaked urine? N Y  Do you have problems with loss of  bowel control? N Y  Managing your Medications? N N  Managing your Finances? N N  Housekeeping or managing your Housekeeping? N N    Patient Care Team: Vickie Epley, MD as PCP  - General (Cardiology) Vickie Epley, MD as PCP - Electrophysiology (Cardiology) Jerline Pain, MD as PCP - Cardiology (Cardiology) Debbora Dus, [facility] as Pharmacist (Pharmacist)  Indicate any recent Medical Services you may have received from other than [facility] providers in the past year (date may be approximate).     Assessment:   This is a routine wellness examination for Paul Fry.  Hearing/Vision screen Hearing Screening - Comments:: No hearing difficulty Vision Screening - Comments:: Wears glasses. Followed by Dr Phillip Heal  Dietary issues and exercise activities discussed: Exercise limited by: None identified   Goals Addressed               This Visit's Progress     No current goal (pt-stated)         Depression Screen    11/04/2021    9:15 AM 03/27/2021    9:00 AM 04/21/2020   10:28 AM 10/16/2019   10:32 AM 12/20/2018    9:46 AM 10/09/2018    9:42 AM 07/22/2017   10:00 AM  PHQ 2/9 Scores  PHQ - 2 Score 0 0 0 0 0 0 0  PHQ- 9 Score    0  0 0    Fall Risk    11/04/2021    9:19 AM 11/03/2021    7:05 PM 03/27/2021    8:59 AM 10/23/2020   11:12 AM 04/21/2020   10:28 AM  Fall Risk   Falls in the past year? 0 0 0 0 0  Number falls in past yr: 0   0 0  Injury with Fall? 0 0  0 0  Risk for fall due to : No Fall Risks   No Fall Risks   Follow up    Falls evaluation completed Falls evaluation completed    [place]:  Any stairs in or around the home? Yes  If so, are there any without handrails? No  Home free of loose throw rugs in walkways, pet beds, electrical cords, etc? Yes  Adequate lighting in your home to reduce risk of falls? Yes   ASSISTIVE DEVICES UTILIZED TO PREVENT FALLS:  Life alert? No  Use of a cane, walker or w/c? No  Grab bars in the bathroom? Yes  Shower chair or bench in shower? No  Elevated toilet seat or a handicapped toilet? Yes   TIMED UP AND GO:  Was the test performed? No . Audio Visit  Cognitive  Function:    10/16/2019   10:40 AM 10/09/2018    9:41 AM 07/22/2017   10:00 AM  MMSE - Mini Mental State Exam  Orientation to time '5 5 5  '$ Orientation to Place '5 5 5  '$ Registration '3 3 3  '$ Attention/ Calculation 5 0 0  Recall '3 3 3  '$ Language- name 2 objects  0 0  Language- repeat '1 1 1  '$ Language- follow 3 step command  0 3  Language- read & follow direction  0 0  Write a sentence  0 0  Copy design  0 0  Total score  17 20        11/04/2021    9:20 AM  6CIT Screen  What Year? 0 points  What month? 0 points  What time? 0 points  Count  back from 20 0 points  Months in reverse 0 points  Repeat phrase 0 points  Total Score 0 points    Immunizations Immunization History  Administered Date(s) Administered   Fluad Quad(high Dose 65+) 02/18/2020   Hepatitis A, Adult 11/07/2014   Influenza Split 05/25/2011   Influenza Whole 04/25/2010   Influenza, Seasonal, Injecte, Preservative Fre 05/25/2012   Influenza,inj,Quad PF,6+ Mos 02/26/2014, 02/05/2016, 01/31/2017, 02/23/2018   Influenza-Unspecified 03/07/2021   Moderna Sars-Covid-2 Vaccination 06/25/2019, 07/23/2019, 03/30/2020, 08/26/2020   Pneumococcal Conjugate-13 07/16/2014   Pneumococcal Polysaccharide-23 04/25/2010, 07/17/2015   Td 05/11/1998, 09/18/2008   Zoster, Live 06/17/2011    TDAP status: Due, Education has been provided regarding the importance of this vaccine. Advised may receive this vaccine at local pharmacy or Health Dept. Aware to provide a copy of the vaccination record if obtained from local pharmacy or Health Dept. Verbalized acceptance and understanding.  Flu Vaccine status: Up to date  Pneumococcal vaccine status: Up to date  Covid-19 vaccine status: Completed vaccines  Qualifies for Shingles Vaccine? Yes   Zostavax completed No   Shingrix Completed?: No.    Education has been provided regarding the importance of this vaccine. Patient has been advised to call insurance company to determine out of pocket  expense if they have not yet received this vaccine. Advised may also receive vaccine at local pharmacy or Health Dept. Verbalized acceptance and understanding.  Screening Tests Health Maintenance  Topic Date Due   OPHTHALMOLOGY EXAM  10/10/2020   COVID-19 Vaccine (5 - Booster for Moderna series) 11/20/2021 (Originally 10/21/2020)   Zoster Vaccines- Shingrix (1 of 2) 02/04/2022 (Originally 11/12/1964)   TETANUS/TDAP  11/05/2022 (Originally 09/19/2018)   INFLUENZA VACCINE  12/08/2021   COLONOSCOPY (Pts 45-40yr Insurance coverage will need to be confirmed)  01/05/2022   HEMOGLOBIN A1C  01/30/2022   FOOT EXAM  07/31/2022   Pneumonia Vaccine 76 Years old  Completed   Hepatitis C Screening  Completed   HPV VACCINES  Aged Out    Health Maintenance  Health Maintenance Due  Topic Date Due   OPHTHALMOLOGY EXAM  10/10/2020    Colorectal cancer screening: Type of screening: Colonoscopy. Completed 01/05/21. Repeat every 8 month  years  Lung Cancer Screening: (Low Dose CT Chest recommended if Age 76-80years, 30 pack-year currently smoking OR have quit w/in 15years.) does not qualify.    Additional Screening:  Hepatitis C Screening: does qualify; Completed 07/19/16  Vision Screening: Recommended annual ophthalmology exams for early detection of glaucoma and other disorders of the eye. Is the patient up to date with their annual eye exam?  Yes  Who is the provider or what is the name of the office in which the patient attends annual eye exams? Dr GPhillip HealIf pt is not established with a provider, would they like to be referred to a provider to establish care? No .   Dental Screening: Recommended annual dental exams for proper oral hygiene  Community Resource Referral / Chronic Care Management:  CRR required this visit?  No   CCM required this visit?  No      Plan:     I have personally reviewed and noted the following in the patient's chart:   Medical and social history Use of  alcohol, tobacco or illicit drugs  Current medications and supplements including opioid prescriptions. Patient is not currently taking opioid prescriptions. Functional ability and status Nutritional status Physical activity Advanced directives List of other physicians Hospitalizations, surgeries, and ER visits in previous 16  months Vitals Screenings to include cognitive, depression, and falls Referrals and appointments  In addition, I have reviewed and discussed with patient certain preventive protocols, quality metrics, and best practice recommendations. A written personalized care plan for preventive services as well as general preventive health recommendations were provided to patient.     Criselda Peaches, LPN   9/34/[id number]   Nurse Notes: None

## 2021-11-05 ENCOUNTER — Encounter (HOSPITAL_COMMUNITY): Payer: Medicare Other

## 2021-11-05 ENCOUNTER — Ambulatory Visit (HOSPITAL_COMMUNITY): Payer: Medicare Other | Attending: Cardiovascular Disease

## 2021-11-05 DIAGNOSIS — R0609 Other forms of dyspnea: Secondary | ICD-10-CM | POA: Diagnosis not present

## 2021-11-05 LAB — MYOCARDIAL PERFUSION IMAGING
LV dias vol: 135 mL (ref 62–150)
LV sys vol: 63 mL
Nuc Stress EF: 54 %
Peak HR: 81 {beats}/min
Rest HR: 58 {beats}/min
Rest Nuclear Isotope Dose: 10.2 mCi
SDS: 1
SRS: 1
SSS: 2
ST Depression (mm): 0 mm
Stress Nuclear Isotope Dose: 30.9 mCi
TID: 0.98

## 2021-11-05 MED ORDER — TECHNETIUM TC 99M TETROFOSMIN IV KIT
10.2000 | PACK | Freq: Once | INTRAVENOUS | Status: AC | PRN
  Administered 2021-11-05: 10.2 via INTRAVENOUS

## 2021-11-05 MED ORDER — REGADENOSON 0.4 MG/5ML IV SOLN
0.4000 mg | Freq: Once | INTRAVENOUS | Status: AC
  Administered 2021-11-05: 0.4 mg via INTRAVENOUS

## 2021-11-05 MED ORDER — TECHNETIUM TC 99M TETROFOSMIN IV KIT
30.9000 | PACK | Freq: Once | INTRAVENOUS | Status: AC | PRN
  Administered 2021-11-05: 30.9 via INTRAVENOUS

## 2021-11-06 DIAGNOSIS — Z23 Encounter for immunization: Secondary | ICD-10-CM | POA: Diagnosis not present

## 2021-11-11 ENCOUNTER — Other Ambulatory Visit: Payer: Self-pay

## 2021-11-11 ENCOUNTER — Encounter (HOSPITAL_COMMUNITY): Payer: Self-pay | Admitting: Gastroenterology

## 2021-11-19 ENCOUNTER — Ambulatory Visit (HOSPITAL_COMMUNITY): Payer: Medicare Other | Admitting: Certified Registered Nurse Anesthetist

## 2021-11-19 ENCOUNTER — Ambulatory Visit (HOSPITAL_COMMUNITY)
Admission: RE | Admit: 2021-11-19 | Discharge: 2021-11-19 | Disposition: A | Discharge status: Home / Self Care | Payer: Medicare Other | Attending: Gastroenterology | Admitting: Gastroenterology

## 2021-11-19 ENCOUNTER — Other Ambulatory Visit: Payer: Self-pay

## 2021-11-19 ENCOUNTER — Ambulatory Visit (HOSPITAL_BASED_OUTPATIENT_CLINIC_OR_DEPARTMENT_OTHER): Payer: Medicare Other | Admitting: Certified Registered Nurse Anesthetist

## 2021-11-19 ENCOUNTER — Encounter (HOSPITAL_COMMUNITY): Payer: Self-pay | Admitting: Gastroenterology

## 2021-11-19 ENCOUNTER — Encounter (HOSPITAL_COMMUNITY)
Admission: RE | Disposition: A | Discharge status: Home / Self Care | Payer: Self-pay | Source: Home / Self Care | Attending: Gastroenterology

## 2021-11-19 DIAGNOSIS — D123 Benign neoplasm of transverse colon: Secondary | ICD-10-CM

## 2021-11-19 DIAGNOSIS — Z79899 Other long term (current) drug therapy: Secondary | ICD-10-CM | POA: Diagnosis not present

## 2021-11-19 DIAGNOSIS — D120 Benign neoplasm of cecum: Secondary | ICD-10-CM | POA: Diagnosis not present

## 2021-11-19 DIAGNOSIS — E119 Type 2 diabetes mellitus without complications: Secondary | ICD-10-CM

## 2021-11-19 DIAGNOSIS — M1990 Unspecified osteoarthritis, unspecified site: Secondary | ICD-10-CM | POA: Diagnosis not present

## 2021-11-19 DIAGNOSIS — K5730 Diverticulosis of large intestine without perforation or abscess without bleeding: Secondary | ICD-10-CM

## 2021-11-19 DIAGNOSIS — Z800 Family history of malignant neoplasm of digestive organs: Secondary | ICD-10-CM | POA: Diagnosis not present

## 2021-11-19 DIAGNOSIS — I4891 Unspecified atrial fibrillation: Secondary | ICD-10-CM | POA: Insufficient documentation

## 2021-11-19 DIAGNOSIS — I2510 Atherosclerotic heart disease of native coronary artery without angina pectoris: Secondary | ICD-10-CM | POA: Diagnosis not present

## 2021-11-19 DIAGNOSIS — Z9889 Other specified postprocedural states: Secondary | ICD-10-CM | POA: Insufficient documentation

## 2021-11-19 DIAGNOSIS — Z86010 Personal history of colonic polyps: Secondary | ICD-10-CM | POA: Diagnosis not present

## 2021-11-19 DIAGNOSIS — I110 Hypertensive heart disease with heart failure: Secondary | ICD-10-CM | POA: Insufficient documentation

## 2021-11-19 DIAGNOSIS — K635 Polyp of colon: Secondary | ICD-10-CM | POA: Diagnosis not present

## 2021-11-19 DIAGNOSIS — K219 Gastro-esophageal reflux disease without esophagitis: Secondary | ICD-10-CM | POA: Diagnosis not present

## 2021-11-19 DIAGNOSIS — Z7902 Long term (current) use of antithrombotics/antiplatelets: Secondary | ICD-10-CM | POA: Diagnosis not present

## 2021-11-19 DIAGNOSIS — I509 Heart failure, unspecified: Secondary | ICD-10-CM | POA: Diagnosis not present

## 2021-11-19 DIAGNOSIS — Z09 Encounter for follow-up examination after completed treatment for conditions other than malignant neoplasm: Secondary | ICD-10-CM | POA: Diagnosis not present

## 2021-11-19 DIAGNOSIS — Z7901 Long term (current) use of anticoagulants: Secondary | ICD-10-CM

## 2021-11-19 HISTORY — PX: BIOPSY: SHX5522

## 2021-11-19 HISTORY — PX: POLYPECTOMY: SHX5525

## 2021-11-19 HISTORY — PX: COLONOSCOPY WITH PROPOFOL: SHX5780

## 2021-11-19 SURGERY — COLONOSCOPY WITH PROPOFOL
Anesthesia: Monitor Anesthesia Care

## 2021-11-19 MED ORDER — SODIUM CHLORIDE 0.9 % IV SOLN: INTRAVENOUS | Status: DC

## 2021-11-19 MED ORDER — PROPOFOL 1000 MG/100ML IV EMUL
INTRAVENOUS | Status: AC
  Filled 2021-11-19: qty 100

## 2021-11-19 MED ORDER — LACTATED RINGERS IV SOLN: INTRAVENOUS | Status: DC

## 2021-11-19 MED ORDER — PROPOFOL 10 MG/ML IV BOLUS
INTRAVENOUS | Status: DC | PRN
  Administered 2021-11-19 (×3): 10 mg via INTRAVENOUS

## 2021-11-19 MED ORDER — PROPOFOL 500 MG/50ML IV EMUL
INTRAVENOUS | Status: DC | PRN
  Administered 2021-11-19: 125 ug/kg/min via INTRAVENOUS

## 2021-11-19 SURGICAL SUPPLY — 22 items

## 2021-11-19 NOTE — Discharge Instructions (Signed)

## 2021-11-19 NOTE — H&P (Signed)
History & Physical  Primary Care Physician:  [name], MD Primary Gastroenterologist: Lucio Edward, MD  CHIEF COMPLAINT:  Personal history of colon polyps, [facility]   HPI: Paul Fry is a 76 y.o. male with a history of adenomatous colon polyps, piecemeal polypectomy in August 2022, family history of colon cancer for colonoscopy.   Past Medical History:  Diagnosis Date   Allergy    SEASONAL   Anemia    Arthritis    KNEES   BPH (benign prostatic hyperplasia)    Difficult airway for intubation    Diverticulosis of colon    Elevated PSA    1.22 on 01-20-2016   Feeling of incomplete bladder emptying    GERD (gastroesophageal reflux disease)    Hiatal hernia    History of adenomatous polyp of colon    2014 tubular adenoma   History of chronic gastritis    History of Helicobacter pylori infection    07/ 2016   History of lower GI bleeding    07/ 2010 and 12/ 2011  diverticular bleed both times   Hyperlipidemia    Hypertension    Lower urinary tract symptoms (LUTS)    Nonischemic cardiomyopathy (HCC)    Plantar fascia syndrome    Presence of Watchman left atrial appendage closure device 07/09/2021   Watchman FLX 69m with Dr. LQuentin Ore  Strains to urinate    INTERMITTANT   Type 2 diabetes, diet controlled [place]    ON BORDER LINE,NEVER TOOK MEDICATION.   Wears glasses     Past Surgical History:  Procedure Laterality Date   APPENDECTOMY  teenager   ATRIAL FIBRILLATION ABLATION N/A 05/15/2021   Procedure: ATRIAL FIBRILLATION ABLATION;  Surgeon: LVickie Epley MD;  [facility];  Service: Cardiovascular;  Laterality: N/A;   CARDIAC CATHETERIZATION  09-10-2008   dr dalton mclean   mild non-obstructive CAD;  30-40% mRCA, 20%pLAD,  ef 55%   CARDIOVASCULAR STRESS TEST  07/29/2011   Low risk nuclear study w/ no ischemia or infarct/  LVEF 47% and  apical hypokinesis (08-07-2012 cardiac MRI -- EF 49% w/ no hyperenhancement distal septal and apical  hypokinesis, mild LAE, mild RVE, mild AR with mild dilated sinus 359m   CATARACT EXTRACTION W/ INTRAOCULAR LENS  IMPLANT, BILATERAL  2016   COLONOSCOPY  last one 12-21-2012   CYSTOSCOPY WITH INSERTION OF UROLIFT N/A 06/17/2016   Procedure: CYSTOSCOPY WITH INSERTION OF UROLIFT;  Surgeon: SiCarolan ClinesMD;  [place] Service: Urology;  Laterality: N/A;   ESOPHAGOGASTRODUODENOSCOPY  last one 11-22-2014   LEFT ATRIAL APPENDAGE OCCLUSION N/A 07/09/2021   Procedure: LEFT ATRIAL APPENDAGE OCCLUSION;  Surgeon: LaVickie EpleyMD;  [facility];  Service: Cardiovascular;  Laterality: N/A;   TEE WITHOUT CARDIOVERSION N/A 07/09/2021   Procedure: TRANSESOPHAGEAL ECHOCARDIOGRAM (TEE);  Surgeon: LaVickie EpleyMD;  [facility];  Service: Cardiovascular;  Laterality: N/A;   TEE WITHOUT CARDIOVERSION N/A 08/20/2021   Procedure: TRANSESOPHAGEAL ECHOCARDIOGRAM (TEE);  Surgeon: ChWerner LeanMD;  [facility];  Service: Cardiovascular;  Laterality: N/A;   TRANSTHORACIC ECHOCARDIOGRAM  09/17/2014   ef 50-55%/  trivial AR, MR and PR/  mild LAE and RAE/  mild TR    Prior to Admission medications   Medication Sig Start Date End Date Taking? Authorizing Provider  ascorbic acid (VITAMIN C) 1000 MG tablet Take 1,000 mg by mouth in the morning.   Yes [provider]  atorvastatin (LIPITOR) 20 MG tablet Take 1 tablet (20 mg total) by mouth every morning. 10/23/20  Yes [name], MD  clopidogrel (PLAVIX) 75 MG tablet Take 1 tablet (75 mg total) by mouth daily. 08/24/21 11/22/21 Yes Vickie Epley, MD  Iron, Ferrous Sulfate, 325 (65 Fe) MG TABS Take 325 mg by mouth every other day. 05/01/21  Yes [name], MD  losartan (COZAAR) 25 MG tablet Take 0.5 tablets (12.5 mg total) by mouth every morning. 10/23/20  Yes [name], MD  metoprolol succinate (TOPROL-XL) 50 MG 24 hr tablet Take 1 tablet (50 mg total)  by mouth daily. Take with or immediately following a meal. 01/21/21  Yes Skains, Thana Farr, MD  Multiple Minerals-Vitamins (CALCIUM 600+D3 PLUS MINERALS) TABS Take 1 tablet by mouth in the morning.   Yes [provider]  Multiple Vitamin (MULTIVITAMIN WITH MINERALS) TABS tablet Take 1 tablet by mouth daily. Centrum   Yes [provider]  Omega-3 Fatty Acids (FISH OIL PO) Take 1 capsule by mouth in the morning.   Yes [provider]  tamsulosin (FLOMAX) 0.4 MG CAPS capsule TAKE 2 CAPSULES (0.8 MG TOTAL) BY MOUTH DAILY. Patient taking differently: Take 0.4 mg by mouth in the morning and at bedtime. 11/05/21  Yes [name], MD  amoxicillin (AMOXIL) 500 MG capsule Take 4 capsules (2,000 mg total) by mouth as directed. Take 4 tablets 1 hour prior to dental work, including cleanings. 07/10/21   Tommie Raymond, NP  omeprazole (PRILOSEC) 20 MG capsule Take 20 mg by mouth in the morning.    [provider]    Current Facility-Administered Medications  Medication Dose Route Frequency Provider Last Rate Last Admin   0.9 %  sodium chloride infusion   Intravenous Continuous Ladene Artist, MD       lactated ringers infusion   Intravenous Continuous Ladene Artist, MD 10 mL/hr at 11/19/21 1006 Continued from Pre-op at 11/19/21 1006    Allergies as of 09/29/2021 - Review Complete 09/29/2021  Allergen Reaction Noted   Aspirin Other (See Comments) 12/31/2016    Family History  Problem Relation Age of Onset   Colon cancer Mother 80   Heart disease Sister        MI, pace maker   Kidney disease Sister        renal failure, nephrectomy- not cancer   Hypertension Sister    Hypertension Sister    Colon polyps Brother    Lung cancer Brother    Diabetes Neg Hx        DM   Depression Neg Hx    Stroke Neg Hx    Alcohol abuse Neg Hx        also no drug abuse    Prostate cancer Neg Hx    Liver cancer Neg Hx    Rectal cancer Neg Hx    Stomach cancer Neg Hx      Social History   Socioeconomic History   Marital status: Married    Spouse name: Not on file   Number of children: 0   Years of education: Not on file   Highest education level: Not on file  Occupational History   Occupation: truck Geophysicist/field seismologist- retired    Fish farm manager: RETIRED  Tobacco Use   Smoking status: Never   Smokeless tobacco: Never  Vaping Use   Vaping Use: Never used  Substance and Sexual Activity   Alcohol use: Not Currently  Comment: rarely drinks beer   Drug use: No   Sexual activity: Not Currently  Other Topics Concern   Not on file  Social History Narrative   Married 2003, no children.    Retired Administrator, gets regular exercise- walking.    Step daughter is MD in [place]   Social Determinants of Health   Financial Resource Strain: Low Risk  (11/04/2021)   Overall Financial Resource Strain (CARDIA)    Difficulty of Paying Living Expenses: Not hard at all  Food Insecurity: No Food Insecurity (11/04/2021)   Hunger Vital Sign    Worried About Running Out of Food in the Last Year: Never true    Ran Out of Food in the Last Year: Never true  Transportation Needs: No Transportation Needs (11/04/2021)   PRAPARE - Hydrologist (Medical): No    Lack of Transportation (Non-Medical): No  Physical Activity: Insufficiently Active (11/04/2021)   Exercise Vital Sign    Days of Exercise per Week: 4 days    Minutes of Exercise per Session: 20 min  Stress: No Stress Concern Present (11/04/2021)   [place]    Feeling of Stress : Not at all  Social Connections: [place] (11/04/2021)   Social Connection and Isolation Panel [NHANES]    Frequency of Communication with Friends and Family: Three times a week    Frequency of Social Gatherings with Friends and Family: Once a week    Attends Religious Services: More than 4 times per year    Active Member of [employer] or  Organizations: Yes    Attends Archivist Meetings: 1 to 4 times per year    Marital Status: Married  Human resources officer Violence: Not At Risk (11/04/2021)   Humiliation, Afraid, Rape, and Kick questionnaire    Fear of Current or Ex-Partner: No    Emotionally Abused: No    Physically Abused: No    Sexually Abused: No    Review of Systems:  All systems reviewed were negative except where noted in HPI.   Physical Exam: Vital signs in last 24 hours: Temp:  [97.6 F (36.4 C)] 97.6 F (36.4 C) (07/13 0919) Pulse Rate:  [80] 80 (07/13 0919) Resp:  [9] 9 (07/13 0919) BP: (117)/(64) 117/64 (07/13 0919) SpO2:  [99 %] 99 % (07/13 0919) Weight:  [89.4 kg] 89.4 kg (07/13 0919)   General:  Alert, well-developed, in NAD Head:  Normocephalic and atraumatic. Eyes:  Sclera clear, no icterus.   Conjunctiva pink. Ears:  Normal auditory acuity. Mouth:  No deformity or lesions.  Neck:  Supple; no masses . Lungs:  Clear throughout to auscultation.   No wheezes, crackles, or rhonchi. No acute distress. Heart:  Regular rate and rhythm; no murmurs. Abdomen:  Soft, nondistended, nontender. No masses, hepatomegaly. No obvious masses.  Normal bowel .    Rectal:  Deferred   Msk:  Symmetrical without gross deformities.. Pulses:  Normal pulses noted. Extremities:  Without edema. Neurologic:  Alert and  oriented x4;  grossly normal neurologically. Skin:  Intact without significant lesions or rashes. Cervical Nodes:  No significant cervical adenopathy. Psych:  Alert and cooperative. Normal mood and affect.   Impression / Plan:   History of adenomatous colon polyps, piecemeal polypectomy in August 2022, family history of colon cancer for colonoscopy.  Pricilla Riffle. Fuller Plan  11/19/2021, 10:12 AM See Shea Evans, [facility], to contact our on call provider

## 2021-11-19 NOTE — Anesthesia Postprocedure Evaluation (Signed)
Anesthesia Post Note  Patient: Paul Fry  Procedure(s) Performed: COLONOSCOPY WITH PROPOFOL POLYPECTOMY     Patient location during evaluation: Endoscopy Anesthesia Type: MAC Level of consciousness: oriented, awake and alert and awake Pain management: pain level controlled Vital Signs Assessment: post-procedure vital signs reviewed and stable Respiratory status: spontaneous breathing, nonlabored ventilation, respiratory function stable and patient connected to nasal cannula oxygen Cardiovascular status: blood pressure returned to baseline and stable Postop Assessment: no headache, no backache and no apparent nausea or vomiting Anesthetic complications: no   No notable events documented.  Last Vitals:  Vitals:   11/19/21 1108 11/19/21 1118  BP: (!) 157/65 (!) 157/55  Pulse: 67   Resp: 11   Temp:    SpO2: 98%     Last Pain:  Vitals:   11/19/21 1100  TempSrc:   PainSc: 0-No pain                 Santa Lighter

## 2021-11-19 NOTE — Anesthesia Procedure Notes (Signed)
Procedure Name: MAC Date/Time: 11/19/2021 10:18 AM  Performed by: Maxwell Caul, CRNAPre-anesthesia Checklist: Patient identified, Emergency Drugs available, Suction available and Patient being monitored Oxygen Delivery Method: Simple face mask

## 2021-11-19 NOTE — Transfer of Care (Signed)
Immediate Anesthesia Transfer of Care Note  Patient: Paul Fry  Procedure(s) Performed: COLONOSCOPY WITH PROPOFOL POLYPECTOMY  Patient Location: PACU  Anesthesia Type:MAC  Level of Consciousness: drowsy  Airway & Oxygen Therapy: Patient Spontanous Breathing and Patient connected to face mask oxygen  Post-op Assessment: Report given to RN, Post -op Vital signs reviewed and stable and Patient moving all extremities X 4  Post vital signs: Reviewed and stable  Last Vitals:  Vitals Value Taken Time  BP 90/34   Temp    Pulse 69 11/19/21 1051  Resp    SpO2 100 % 11/19/21 1051  Vitals shown include unvalidated device data.  Last Pain:  Vitals:   11/19/21 0919  TempSrc: Temporal  PainSc: 0-No pain         Complications: No notable events documented.

## 2021-11-19 NOTE — Anesthesia Preprocedure Evaluation (Signed)
Anesthesia Evaluation  Patient identified by MRN, date of birth, ID band Patient awake    Reviewed: Allergy & Precautions, NPO status , Patient's Chart, lab work & pertinent test results, reviewed documented beta blocker date and time   Airway Mallampati: II  TM Distance: >3 FB Neck ROM: Full    Dental  (+) Dental Advisory Given, Caps   Pulmonary neg pulmonary ROS,    Pulmonary exam normal breath sounds clear to auscultation       Cardiovascular hypertension, Pt. on home beta blockers and Pt. on medications + CAD and +CHF  + dysrhythmias (s/p Watchman) Atrial Fibrillation  Rhythm:Irregular Rate:Normal     Neuro/Psych negative neurological ROS  negative psych ROS   GI/Hepatic Neg liver ROS, hiatal hernia, GERD  Medicated,Hx of TVA polyp   Endo/Other  negative endocrine ROSdiabetes  Renal/GU negative Renal ROS Bladder dysfunction      Musculoskeletal  (+) Arthritis ,   Abdominal   Peds  Hematology  (+) Blood dyscrasia (Plavix), ,   Anesthesia Other Findings Day of surgery medications reviewed with the patient.  Reproductive/Obstetrics                             Anesthesia Physical Anesthesia Plan  ASA: 3  Anesthesia Plan: MAC   Post-op Pain Management: Minimal or no pain anticipated   Induction: Intravenous  PONV Risk Score and Plan: 1 and TIVA and Treatment may vary due to age or medical condition  Airway Management Planned: Natural Airway and Nasal Cannula  Additional Equipment:   Intra-op Plan:   Post-operative Plan:   Informed Consent: I have reviewed the patients History and Physical, chart, labs and discussed the procedure including the risks, benefits and alternatives for the proposed anesthesia with the patient or authorized representative who has indicated his/her understanding and acceptance.     Dental advisory given  Plan Discussed with: CRNA  Anesthesia  Plan Comments:         Anesthesia Quick Evaluation

## 2021-11-19 NOTE — Op Note (Signed)
[facility] Patient Name: Paul Fry Procedure Date: 11/19/2021 MRN: [id number] Attending MD: Ladene Artist , MD Date of Birth: 09/25/1945 CSN: [id number] Age: 76 Admit Type: Emergency Department Procedure:                Colonoscopy Indications:              Surveillance: Personal history of piecemeal removal                            of large sessile adenoma on last colonoscopy (less                            than 1 year ago), Family history of colon cancer. Providers:                Pricilla Riffle. Fuller Plan, MD, Jaci Carrel, RN, Benetta Spar, Technician Referring MD:             Elveria Rising. Damita Dunnings, MD Medicines:                Monitored Anesthesia Care Complications:            No immediate complications. Estimated blood loss:                            None. Estimated Blood Loss:     Estimated blood loss: none. Procedure:                Pre-Anesthesia Assessment:                           - Prior to the procedure, a History and Physical                            was performed, and patient medications and                            allergies were reviewed. The patient's tolerance of                            previous anesthesia was also reviewed. The risks                            and benefits of the procedure and the sedation                            options and risks were discussed with the patient.                            All questions were answered, and informed consent                            was obtained. Prior Anticoagulants: The patient has  taken Plavix (clopidogrel), last dose was 5 days                            prior to procedure. ASA Grade Assessment: III - A                            patient with severe systemic disease. After                            reviewing the risks and benefits, the patient was                            deemed in satisfactory condition to undergo the                             procedure.                           After obtaining informed consent, the colonoscope                            was passed under direct vision. Throughout the                            procedure, the patient's blood pressure, pulse, and                            oxygen saturations were monitored continuously. The                            [id number]-HQ190L ([id number]) Olympus colonoscope was                            introduced through the anus and advanced to the the                            cecum, identified by appendiceal orifice and                            ileocecal valve. The ileocecal valve, appendiceal                            orifice, and rectum were photographed. The quality                            of the bowel preparation was adequate after                            extensive lavage and suction. The colonoscopy was                            performed without difficulty. The patient tolerated  the procedure well. Scope In: 10:26:36 AM Scope Out: 10:46:30 AM Scope Withdrawal Time: 0 hours 15 minutes 19 seconds  Total Procedure Duration: 0 hours 19 minutes 54 seconds  Findings:      The perianal and digital rectal examinations were normal.      A 10 mm post polypectomy scar was found in the cecum. There was 5 mm       residual polypoid tissue at the edge of the scar. The polypoid tissue       was removed with a cold snare. Resection and retrieval were complete.      A 6 mm polyp was found in the transverse colon. The polyp was sessile.       The polyp was removed with a cold snare. Resection and retrieval were       complete.      Multiple small-mouthed diverticula were found in the right colon. There       was no evidence of diverticular bleeding.      Multiple medium-mouthed diverticula were found in the left colon. There       was narrowing of the colon in association with the diverticular opening.       There was  evidence of diverticular spasm. Peri-diverticular erythema was       seen. There was evidence of an impacted diverticulum. There was no       evidence of diverticular bleeding.      A tattoo was seen in the sigmoid colon. The tattoo site appeared normal.      The exam was otherwise without abnormality on direct and retroflexion       views. Impression:               - Post-polypectomy scar in the cecum with polypoid                            tissue at the edge. Resected and retrieved.                           - One 6 mm polyp in the transverse colon, removed                            with a cold snare. Resected and retrieved.                           - Mild diverticulosis in the right colon.                           - Severe diverticulosis in the left colon.                           - A tattoo was seen in the sigmoid colon. The                            tattoo site appeared normal.                           - The examination was otherwise normal on direct  and retroflexion views. Moderate Sedation:      Not Applicable - Patient had care per Anesthesia. Recommendation:           - Resume Plavix (clopidogrel) in 2 days at prior                            dose. Refer to managing physician for further                            adjustment of therapy.                           - Patient has a contact number available for                            emergencies. The signs and symptoms of potential                            delayed complications were discussed with the                            patient. Return to normal activities tomorrow.                            Written discharge instructions were provided to the                            patient.                           - High fiber diet long term.                           - Continue present medications.                           - Benefiber daily long term.                           - 6-8 glasses of  water daily long term.                           - Await pathology results.                           - No repeat colonoscopy due to age. If another                            colonoscopy is needed recommend a 2 day bowel prep. Procedure Code(s):        --- Professional ---                           [phone], Colonoscopy, flexible; with removal of                            tumor(s), polyp(s),  or other lesion(s) by snare                            technique                           [id number], 59, Colonoscopy, flexible; with biopsy,                            single or multiple Diagnosis Code(s):        --- Professional ---                           [phone], Other specified postprocedural states                           K63.5, Polyp of colon                           Z09, Encounter for follow-up examination after                            completed treatment for conditions other than                            malignant neoplasm                           Z86.010, Personal history of colonic polyps                           K57.30, Diverticulosis of large intestine without                            perforation or abscess without bleeding CPT copyright 2019 American Medical Association. All rights reserved. The codes documented in this report are preliminary and upon coder review may  be revised to meet current compliance requirements. Ladene Artist, MD 11/19/2021 11:00:22 AM This report has been signed electronically. Number of Addenda: 0

## 2021-11-20 LAB — SURGICAL PATHOLOGY

## 2021-11-23 ENCOUNTER — Other Ambulatory Visit (INDEPENDENT_AMBULATORY_CARE_PROVIDER_SITE_OTHER): Payer: Medicare Other

## 2021-11-23 DIAGNOSIS — E119 Type 2 diabetes mellitus without complications: Secondary | ICD-10-CM | POA: Diagnosis not present

## 2021-11-23 DIAGNOSIS — D649 Anemia, unspecified: Secondary | ICD-10-CM

## 2021-11-23 LAB — BASIC METABOLIC PANEL
BUN: 10 mg/dL (ref 6–23)
CO2: 29 mEq/L (ref 19–32)
Calcium: 9.2 mg/dL (ref 8.4–10.5)
Chloride: 101 mEq/L (ref 96–112)
Creatinine, Ser: 0.87 mg/dL (ref 0.40–1.50)
GFR: 84.1 mL/min (ref >60.00)
Glucose, Bld: 129 mg/dL — ABNORMAL HIGH (ref 70–99)
Potassium: 4.5 mEq/L (ref 3.5–5.1)
Sodium: 137 mEq/L (ref 135–145)

## 2021-11-23 LAB — CBC WITH DIFFERENTIAL/PLATELET
Basophils Absolute: 0 10*3/uL (ref 0.0–0.1)
Basophils Relative: 1 % (ref 0.0–3.0)
Eosinophils Absolute: 0.1 10*3/uL (ref 0.0–0.7)
Eosinophils Relative: 1.6 % (ref 0.0–5.0)
HCT: 35.7 % — ABNORMAL LOW (ref 39.0–52.0)
Hemoglobin: 11.6 g/dL — ABNORMAL LOW (ref 13.0–17.0)
Lymphocytes Relative: 31.3 % (ref 12.0–46.0)
Lymphs Abs: 1.4 10*3/uL (ref 0.7–4.0)
MCHC: 32.6 g/dL (ref 30.0–36.0)
MCV: 80.5 fl (ref 78.0–100.0)
Monocytes Absolute: 0.2 10*3/uL (ref 0.1–1.0)
Monocytes Relative: 5.4 % (ref 3.0–12.0)
Neutro Abs: 2.7 10*3/uL (ref 1.4–7.7)
Neutrophils Relative %: 60.7 % (ref 43.0–77.0)
Platelets: 299 10*3/uL (ref 150.0–400.0)
RBC: 4.43 Mil/uL (ref 4.22–5.81)
RDW: 23.5 % — ABNORMAL HIGH (ref 11.5–15.5)
WBC: 4.5 10*3/uL (ref 4.0–10.5)

## 2021-11-30 ENCOUNTER — Encounter: Payer: Self-pay | Admitting: Family Medicine

## 2021-11-30 ENCOUNTER — Ambulatory Visit (INDEPENDENT_AMBULATORY_CARE_PROVIDER_SITE_OTHER): Payer: Medicare Other | Admitting: Family Medicine

## 2021-11-30 VITALS — BP 120/78 | HR 75 | Temp 97.2°F | Ht 72.0 in | Wt 198.0 lb

## 2021-11-30 DIAGNOSIS — N400 Enlarged prostate without lower urinary tract symptoms: Secondary | ICD-10-CM

## 2021-11-30 DIAGNOSIS — E119 Type 2 diabetes mellitus without complications: Secondary | ICD-10-CM | POA: Diagnosis not present

## 2021-11-30 DIAGNOSIS — Z0000 Encounter for general adult medical examination without abnormal findings: Secondary | ICD-10-CM

## 2021-11-30 DIAGNOSIS — Z125 Encounter for screening for malignant neoplasm of prostate: Secondary | ICD-10-CM | POA: Diagnosis not present

## 2021-11-30 DIAGNOSIS — I429 Cardiomyopathy, unspecified: Secondary | ICD-10-CM

## 2021-11-30 DIAGNOSIS — D649 Anemia, unspecified: Secondary | ICD-10-CM | POA: Diagnosis not present

## 2021-11-30 DIAGNOSIS — E7800 Pure hypercholesterolemia, unspecified: Secondary | ICD-10-CM

## 2021-11-30 DIAGNOSIS — Z7189 Other specified counseling: Secondary | ICD-10-CM

## 2021-11-30 LAB — LIPID PANEL
Cholesterol: 123 mg/dL (ref 0–200)
HDL: 26.4 mg/dL — ABNORMAL LOW (ref >39.00)
LDL Cholesterol: 68 mg/dL (ref 0–99)
NonHDL: 96.84
Total CHOL/HDL Ratio: 5
Triglycerides: 142 mg/dL (ref 0.0–149.0)
VLDL: 28.4 mg/dL (ref 0.0–40.0)

## 2021-11-30 LAB — HEPATIC FUNCTION PANEL
ALT: 19 U/L (ref 0–53)
AST: 23 U/L (ref 0–37)
Albumin: 4.1 g/dL (ref 3.5–5.2)
Alkaline Phosphatase: 58 U/L (ref 39–117)
Bilirubin, Direct: 0.1 mg/dL (ref 0.0–0.3)
Total Bilirubin: 0.5 mg/dL (ref 0.2–1.2)
Total Protein: 7.3 g/dL (ref 6.0–8.3)

## 2021-11-30 LAB — PSA, MEDICARE: PSA: 1.45 ng/ml (ref 0.10–4.00)

## 2021-11-30 LAB — HEMOGLOBIN A1C: Hgb A1c MFr Bld: 6.7 % — ABNORMAL HIGH (ref 4.6–6.5)

## 2021-11-30 LAB — IRON: Iron: 84 ug/dL (ref 42–165)

## 2021-11-30 MED ORDER — CLOPIDOGREL BISULFATE 75 MG PO TABS: 75.0000 mg | ORAL_TABLET | Freq: Every day | ORAL | Status: DC

## 2021-11-30 MED ORDER — ATORVASTATIN CALCIUM 20 MG PO TABS: 20.0000 mg | ORAL_TABLET | Freq: Every morning | ORAL | 3 refills | Status: DC

## 2021-11-30 NOTE — Progress Notes (Unsigned)
Diabetes:  No meds.  Hypoglycemic episodes: no sx Hyperglycemic episodes: no sx Feet problems: no Blood Sugars averaging: not checked.   eye exam within last year: yes  Elevated Cholesterol: Using medications without problems: yes Muscle aches: no Diet compliance: no Exercise:no Labs d/w pt.    Still on iron at baseline.  H/o anemia.    Small mass R side of the neck.  Present for about 1 month.  Not painful, not enlarging.  Not draining.  It feels like a small seb cyst more than a node.  No other LA.    LUTS improved on flomax, stream is better.    Still on plavix and I updated cardiology about ongoing rx.  He has f/u study pending after prev watchman placement.    Flu encouraged Shingrix first dose done 10/2021 PNA UTD Tetanus 2010, dw pt.   covid vaccine prev done.  Colonoscopy 2023 PSA wnl 2022.  D/w pt.   Advance directive- niece Paul Fry designated if patient were incapacitated.   PMH and SH reviewed  Meds, vitals, and allergies reviewed.   ROS: Per HPI unless specifically indicated in ROS section   GEN: nad, alert and oriented HEENT: ncat NECK: supple w/o LA Small mass R side of the neck.  Superficial.  Not painful.  Not draining.  It feels like a small seb cyst more than a node.  No other LA. I asked patient to monitor in the meantime. CV: rrr. PULM: ctab, no inc wob ABD: soft, +bs EXT: no edema SKIN: no acute rash but 1cm brown macule on R medial shin- longstanding per patient report.    Diabetic foot exam: Normal inspection No skin breakdown No callous Normal DP pulses Normal sensation to light touch and monofilament Nails normal L foot with 75m dark macule near the posterior lateal midfoot.  This is round and not irregular.  It is uniform and does not have varying pigmentation.  See after visit summary.

## 2021-11-30 NOTE — Patient Instructions (Addendum)
Ask the front for a record release from Dr. Prudencio Burly.    If the skin spots are changing, then let me know.   Go to the lab on the way out.   If you have mychart we'll likely use that to update you.    Take care.  Glad to see you.  We'll see about when to get together after I see your labs.

## 2021-12-01 ENCOUNTER — Encounter: Payer: Self-pay | Admitting: Gastroenterology

## 2021-12-02 NOTE — Assessment & Plan Note (Signed)
-   Continue Flomax 

## 2021-12-02 NOTE — Assessment & Plan Note (Signed)
Advance directive- niece Lemarcus Baggerly designated if patient were incapacitated.

## 2021-12-02 NOTE — Assessment & Plan Note (Signed)
Continue iron.  See notes on labs. 

## 2021-12-02 NOTE — Assessment & Plan Note (Signed)
No medications.  See notes on labs.  Send recheck after reviewing labs.  Continue work on diet and exercise.

## 2021-12-02 NOTE — Assessment & Plan Note (Signed)
Flu encouraged Shingrix first dose done 10/2021 PNA UTD Tetanus 2010, dw pt.   covid vaccine prev done.  Colonoscopy 2023 PSA wnl 2022.  D/w pt.   Advance directive- niece Paul Fry designated if patient were incapacitated.

## 2021-12-02 NOTE — Assessment & Plan Note (Signed)
Continue atorvastatin.  Continue work on diet and exercise. 

## 2021-12-03 ENCOUNTER — Other Ambulatory Visit: Payer: Self-pay | Admitting: Cardiology

## 2021-12-03 LAB — FRUCTOSAMINE: Fructosamine: 275 umol/L (ref 205–285)

## 2021-12-03 MED ORDER — CLOPIDOGREL BISULFATE 75 MG PO TABS: 75.0000 mg | ORAL_TABLET | Freq: Every day | ORAL | 1 refills | Status: DC

## 2021-12-08 ENCOUNTER — Other Ambulatory Visit: Payer: Self-pay | Admitting: Cardiology

## 2021-12-08 MED ORDER — CLOPIDOGREL BISULFATE 75 MG PO TABS: 75.0000 mg | ORAL_TABLET | Freq: Every day | ORAL | 1 refills | Status: AC

## 2021-12-30 ENCOUNTER — Other Ambulatory Visit: Payer: Self-pay | Admitting: Family Medicine

## 2021-12-30 ENCOUNTER — Ambulatory Visit: Payer: Medicare Other

## 2021-12-30 ENCOUNTER — Other Ambulatory Visit: Payer: Self-pay | Admitting: Cardiology

## 2022-01-01 ENCOUNTER — Encounter (HOSPITAL_COMMUNITY): Payer: Self-pay | Admitting: Cardiovascular Disease

## 2022-01-01 ENCOUNTER — Ambulatory Visit (INDEPENDENT_AMBULATORY_CARE_PROVIDER_SITE_OTHER): Payer: Medicare Other | Admitting: Physician Assistant

## 2022-01-01 VITALS — BP 120/60 | HR 63 | Ht 72.0 in | Wt 202.2 lb

## 2022-01-01 DIAGNOSIS — Z95818 Presence of other cardiac implants and grafts: Secondary | ICD-10-CM | POA: Diagnosis not present

## 2022-01-01 DIAGNOSIS — I2510 Atherosclerotic heart disease of native coronary artery without angina pectoris: Secondary | ICD-10-CM | POA: Diagnosis not present

## 2022-01-01 DIAGNOSIS — I10 Essential (primary) hypertension: Secondary | ICD-10-CM | POA: Diagnosis not present

## 2022-01-01 DIAGNOSIS — I480 Paroxysmal atrial fibrillation: Secondary | ICD-10-CM

## 2022-01-01 NOTE — Progress Notes (Signed)
[facility]                                     Cardiology Office Note:    Date:  01/01/2022   ID:  Paul Fry, DOB Sep 26, 1945, MRN [id number]  PCP:  [name], MD  [facility] Cardiologist:  Candee Furbish, MD  [facility] Electrophysiologist:  Vickie Epley, MD   Referring MD: [name], MD   6 month follow up s/p LAAO   History of Present Illness:    Paul Fry is a 76 y.o. male with a hx of DM2, CAD, systolic dysfunction, atrial flutter/fibrillation s/p ablation , and GI bleeding secondary to anticoagulation s/p LAAO with Watchman (07/09/21) who presents to clinic for follow up.    [name] underwent atrial fibrillation/flutter ablation with Dr. Quentin Ore 05/15/21. He was seen by the [facility] 06/12/21 for follow up and reported symptoms improvement.    The patient underwent LAAO placement with Watchman FLX 5m with Dr. LQuentin Ore He had issues with his groin post op but groin [place] negative for PSA.   Follow up TEE on 08/20/21 showed a small device leak and plan was for follow up TEE, which is set up for 01/08/22.  Today the patient presents to clinic for follow up.  Here alone. No CP. He does have some SOB but its a little better since ablation. No LE edema, orthopnea or PND. No dizziness or syncope. No blood in stool or urine. No palpitations.      Past Medical History:  Diagnosis Date   Allergy    SEASONAL   Anemia    Arthritis    KNEES   BPH (benign prostatic hyperplasia)    Difficult airway for intubation    Diverticulosis of colon    Elevated PSA    1.22 on 01-20-2016   Feeling of incomplete bladder emptying    GERD (gastroesophageal reflux disease)    Hiatal hernia    History of adenomatous polyp of colon    2014 tubular adenoma   History of chronic gastritis    History of Helicobacter pylori infection    07/ 2016   History of lower GI bleeding    07/ 2010 and 12/ 2011  diverticular bleed both times   Hyperlipidemia     Hypertension    Lower urinary tract symptoms (LUTS)    Nonischemic cardiomyopathy (HCC)    Plantar fascia syndrome    Presence of Watchman left atrial appendage closure device 07/09/2021   Watchman FLX 377mwith Dr. LaQuentin Ore Strains to urinate    INTERMITTANT   Type 2 diabetes, [place] controlled [place]   [place],NEVER TOOK MEDICATION.   Wears glasses     Past Surgical History:  Procedure Laterality Date   APPENDECTOMY  teenager   ATRIAL FIBRILLATION ABLATION N/A 05/15/2021   Procedure: ATRIAL FIBRILLATION ABLATION;  Surgeon: LaVickie EpleyMD;  [facility];  Service: Cardiovascular;  Laterality: N/A;   BIOPSY  11/19/2021   Procedure: BIOPSY;  Surgeon: StLadene ArtistMD;  [facility]: [facility];  Service: Gastroenterology;;   CARDIAC CATHETERIZATION  09-10-2008   dr dalton mclean   mild non-obstructive CAD;  30-40% mRCA, 20%pLAD,  ef 55%   CARDIOVASCULAR STRESS TEST  07/29/2011   Low risk nuclear study w/ no ischemia or infarct/  LVEF 47% and  apical hypokinesis (08-07-2012 cardiac MRI -- EF 49% w/ no hyperenhancement distal septal and apical hypokinesis, mild LAE, mild RVE, mild AR with mild dilated sinus 61m)   CATARACT EXTRACTION W/ INTRAOCULAR LENS  IMPLANT, BILATERAL  2016   COLONOSCOPY  last one 12-21-2012   COLONOSCOPY WITH PROPOFOL N/A 11/19/2021   Procedure: COLONOSCOPY WITH PROPOFOL;  Surgeon: SLadene Artist MD;  [facility];  Service: Gastroenterology;  Laterality: N/A;   CYSTOSCOPY WITH INSERTION OF UROLIFT N/A 06/17/2016   Procedure: CYSTOSCOPY WITH INSERTION OF UROLIFT;  Surgeon: SCarolan Clines MD;  [facility]  Service: Urology;  Laterality: N/A;   ESOPHAGOGASTRODUODENOSCOPY  last one 11-22-2014   LEFT ATRIAL APPENDAGE OCCLUSION N/A 07/09/2021   Procedure: LEFT ATRIAL APPENDAGE OCCLUSION;  Surgeon: LVickie Epley MD;  [facility];  Service: Cardiovascular;  Laterality: N/A;    POLYPECTOMY  11/19/2021   Procedure: POLYPECTOMY;  Surgeon: SLadene Artist MD;  [facility];  Service: Gastroenterology;;   TEE WITHOUT CARDIOVERSION N/A 07/09/2021   Procedure: TRANSESOPHAGEAL ECHOCARDIOGRAM (TEE);  Surgeon: LVickie Epley MD;  [facility];  Service: Cardiovascular;  Laterality: N/A;   TEE WITHOUT CARDIOVERSION N/A 08/20/2021   Procedure: TRANSESOPHAGEAL ECHOCARDIOGRAM (TEE);  Surgeon: CWerner Lean MD;  [facility];  Service: Cardiovascular;  Laterality: N/A;   TRANSTHORACIC ECHOCARDIOGRAM  09/17/2014   ef 50-55%/  trivial AR, MR and PR/  mild LAE and RAE/  mild TR    Current Medications: Current Meds  Medication Sig   ascorbic acid (VITAMIN C) 1000 MG tablet Take 1,000 mg by mouth in the morning.   atorvastatin (LIPITOR) 20 MG tablet Take 1 tablet (20 mg total) by mouth every morning.   clopidogrel (PLAVIX) 75 MG tablet Take 1 tablet (75 mg total) by mouth daily.   Iron, Ferrous Sulfate, 325 (65 Fe) MG TABS Take 325 mg by mouth every other day.   losartan (COZAAR) 25 MG tablet TAKE 1/2 TABLET EVERY MORNING   metoprolol succinate (TOPROL-XL) 50 MG 24 hr tablet TAKE 1 TABLET EVERY DAY WITH OR IMMEDIATELY FOLLOWING A MEAL   Multiple Minerals-Vitamins (CALCIUM 600+D3 PLUS MINERALS) TABS Take 1 tablet by mouth in the morning.   Multiple Vitamin (MULTIVITAMIN WITH MINERALS) TABS tablet Take 1 tablet by mouth daily. Centrum   Omega-3 Fatty Acids (FISH OIL PO) Take 1 capsule by mouth in the morning.   tamsulosin (FLOMAX) 0.4 MG CAPS capsule TAKE 2 CAPSULES (0.8 MG TOTAL) BY MOUTH DAILY.     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    Retired tAdministrator gets regular exercise- walking.    [place]  Social Determinants of Health   Financial Resource Strain: Low  Risk  (11/04/2021)   Overall Financial Resource Strain (CARDIA)    Difficulty of Paying Living Expenses: Not hard at all  Food Insecurity: No Food Insecurity (11/04/2021)   Hunger Vital Sign    Worried About Running Out of Food in the Last Year: Never true    Ran Out of Food in the Last Year: Never true  Transportation Needs: No Transportation Needs (11/04/2021)   PRAPARE - Hydrologist (Medical): No    Lack of Transportation (Non-Medical): No  Physical Activity: Insufficiently Active (11/04/2021)   Exercise Vital Sign    Days of Exercise per Week: 4 days    Minutes of Exercise per Session: 20 min  Stress: No Stress Concern Present (11/04/2021)   [place]    Feeling of Stress : Not at all  Social Connections: [place] (11/04/2021)   Social Connection and Isolation Panel [NHANES]    Frequency of Communication with Friends and Family: Three times a week    Frequency of Social Gatherings with Friends and Family: Once a week    Attends Religious Services: More than 4 times per year    Active Member of [employer] or Organizations: Yes    Attends Archivist Meetings: 1 to 4 times per year    Marital Status: Married     Family History: The patient's family history includes Colon cancer (age of onset: 66) in his mother; Colon polyps in his brother; Heart disease in his sister; Hypertension in his sister and sister; Kidney disease in  his sister; Lung cancer in his brother. There is no history of Diabetes, Depression, Stroke, Alcohol abuse, Prostate cancer, Liver cancer, Rectal cancer, or Stomach cancer.  ROS:   Please see the history of present illness.    All other systems reviewed and are negative.  EKGs/Labs/Other Studies Reviewed:    The following studies were reviewed today:  LAAO 07/09/21:  CONCLUSIONS:  1.Successful implantation of a WATCHMAN left atrial appendage occlusive device    2. TEE demonstrating no LAA thrombus 3. No early apparent complications.    Post Implant Anticoagulation Strategy: Continue xarelto 53m by mouth daily for the 45 days after implant. If 45-day TEE criteria are met, transition to plavix 737mby mouth daily to complete 6 months of post-implant therapy.     EKG:  EKG is ordered today. This shows NSR HR 63  Recent Labs: 11/23/2021: BUN 10; Creatinine, Ser 0.87; Hemoglobin 11.6; Platelets 299.0; Potassium 4.5; Sodium 137 11/30/2021: ALT 19  Recent Lipid Panel    Component Value Date/Time   CHOL 123 11/30/2021 0948   TRIG 142.0 11/30/2021 0948   HDL 26.40 (L) 11/30/2021 0948   CHOLHDL 5 11/30/2021 0948   VLDL 28.4 11/30/2021 0948   LDLCALC 68 11/30/2021 0948   Physical Exam:    VS:  BP 120/60   Pulse 63   Ht 6' (1.829 m)   Wt 202 lb 3.2 oz (91.7 kg)   SpO2 98%   BMI 27.42 kg/m     Wt Readings from Last 3 Encounters:  01/01/22 202 lb 3.2 oz (91.7 kg)  11/30/21 198 lb (89.8 kg)  11/19/21 197 lb (89.4 kg)    General: Well developed, well nourished, NAD Lungs:Clear to ausculation bilaterally. No wheezes, rales, or rhonchi. Breathing is unlabored. Cardiovascular: RRR with S1 S2. No murmurs Extremities: No edema.  Neuro: Alert and oriented. No focal deficits. No facial asymmetry. MAE spontaneously. Psych: Responds to  questions appropriately with normal affect.    ASSESSMENT/PLAN:    Paroxysmal Atrial Fibrillation/atrial flutter s/p ablation and Watchman: The patient  underwent LAAO placement with Watchman FLX 104m with Dr. LQuentin Ore3/2/23. TEE  08/20/21 showed a small device leak and plan was for follow up TEE, which is set up for 01/08/22. Continue on aspirin and plavix until we have the results of this TEE.   After careful review of history and examination, the risks and benefits of transesophageal echocardiogram have been explained including risks of esophageal damage, perforation (1:10,000 risk), bleeding, pharyngeal hematoma as well as other potential complications associated with conscious sedation including aspiration, arrhythmia, respiratory failure and death. Alternatives to treatment were discussed, questions were answered. Patient is willing to proceed.   CAD: No anginal symptoms.    HTN: Stable, no changes today.   Medication Adjustments/Labs and Tests Ordered: Current medicines are reviewed at length with the patient today.  Concerns regarding medicines are outlined above.  Orders Placed This Encounter  Procedures   CBC   Basic metabolic panel   EKG 192-HVFM  No orders of the defined types were placed in this encounter.   Patient Instructions  Medication Instructions:  Your physician recommends that you continue on your current medications as directed. Please refer to the Current Medication list given to you today.  *If you need a refill on your cardiac medications before your next appointment, please call your pharmacy*   Lab Work: TODAY: BMP, CBC If you have labs (blood work) drawn today and your tests are completely normal, you will receive your results only by: [place](if you have MyChart) OR A paper copy in the mail If you have any lab test that is abnormal or we need to change your treatment, we will call you to review the results.   Testing/Procedures: NONE   Follow-Up:To be determined  [facility] and [facility] are our [facility].  As part of our continuing mission to provide you with exceptional  heart care, we have created designated Provider Care Teams.  These Care Teams include your primary Cardiologist (physician) and Advanced Practice Providers (APPs -  Physician Assistants and Nurse Practitioners) who all work together to provide you with the care you need, when you need it.  We recommend signing up for the patient portal called "MyChart".  Sign up information is provided on this After Visit Summary.  MyChart is used to connect with patients for Virtual Visits (Telemedicine).  Patients are able to view lab/test results, encounter notes, upcoming appointments, etc.  Non-urgent messages can be sent to your provider as well.   To learn more about what you can do with MyChart, go to [email or web address]      Important Information About Sugar         Signed, KAngelena Form PA-C  01/01/2022 11:43 AM    [facility]

## 2022-01-01 NOTE — H&P (View-Only) (Signed)
[facility]                                     Cardiology Office Note:    Date:  01/01/2022   ID:  Paul Fry, DOB Sep 26, 1945, MRN [id number]  PCP:  [name], MD  [facility] Cardiologist:  Candee Furbish, MD  [facility] Electrophysiologist:  Vickie Epley, MD   Referring MD: [name], MD   6 month follow up s/p LAAO   History of Present Illness:    Paul Fry is a 76 y.o. male with a hx of DM2, CAD, systolic dysfunction, atrial flutter/fibrillation s/p ablation , and GI bleeding secondary to anticoagulation s/p LAAO with Watchman (07/09/21) who presents to clinic for follow up.    [name] underwent atrial fibrillation/flutter ablation with Dr. Quentin Ore 05/15/21. He was seen by the AF Clinic 06/12/21 for follow up and reported symptoms improvement.    The patient underwent LAAO placement with Watchman FLX 5m with Dr. LQuentin Ore He had issues with his groin post op but groin [place] negative for PSA.   Follow up TEE on 08/20/21 showed a small device leak and plan was for follow up TEE, which is set up for 01/08/22.  Today the patient presents to clinic for follow up.  Here alone. No CP. He does have some SOB but its a little better since ablation. No LE edema, orthopnea or PND. No dizziness or syncope. No blood in stool or urine. No palpitations.      Past Medical History:  Diagnosis Date   Allergy    SEASONAL   Anemia    Arthritis    KNEES   BPH (benign prostatic hyperplasia)    Difficult airway for intubation    Diverticulosis of colon    Elevated PSA    1.22 on 01-20-2016   Feeling of incomplete bladder emptying    GERD (gastroesophageal reflux disease)    Hiatal hernia    History of adenomatous polyp of colon    2014 tubular adenoma   History of chronic gastritis    History of Helicobacter pylori infection    07/ 2016   History of lower GI bleeding    07/ 2010 and 12/ 2011  diverticular bleed both times   Hyperlipidemia     Hypertension    Lower urinary tract symptoms (LUTS)    Nonischemic cardiomyopathy (HCC)    Plantar fascia syndrome    Presence of Watchman left atrial appendage closure device 07/09/2021   Watchman FLX 377mwith Dr. LaQuentin Ore Strains to urinate    INTERMITTANT   Type 2 diabetes, diet controlled [place]   ON BORDER LINE,NEVER TOOK MEDICATION.   Wears glasses     Past Surgical History:  Procedure Laterality Date   APPENDECTOMY  teenager   ATRIAL FIBRILLATION ABLATION N/A 05/15/2021   Procedure: ATRIAL FIBRILLATION ABLATION;  Surgeon: LaVickie EpleyMD;  [facility];  Service: Cardiovascular;  Laterality: N/A;   BIOPSY  11/19/2021   Procedure: BIOPSY;  Surgeon: StLadene ArtistMD;  [facility];  Service: Gastroenterology;;   CARDIAC CATHETERIZATION  09-10-2008   dr dalton mclean   mild non-obstructive CAD;  30-40% mRCA, 20%pLAD,  ef 55%   CARDIOVASCULAR STRESS TEST  07/29/2011   Low risk nuclear study w/ no ischemia or infarct/  LVEF 47% and  apical hypokinesis (08-07-2012 cardiac MRI -- EF 49% w/ no hyperenhancement distal septal and apical hypokinesis, mild LAE, mild RVE, mild AR with mild dilated sinus 61m)   CATARACT EXTRACTION W/ INTRAOCULAR LENS  IMPLANT, BILATERAL  2016   COLONOSCOPY  last one 12-21-2012   COLONOSCOPY WITH PROPOFOL N/A 11/19/2021   Procedure: COLONOSCOPY WITH PROPOFOL;  Surgeon: SLadene Artist MD;  [facility];  Service: Gastroenterology;  Laterality: N/A;   CYSTOSCOPY WITH INSERTION OF UROLIFT N/A 06/17/2016   Procedure: CYSTOSCOPY WITH INSERTION OF UROLIFT;  Surgeon: SCarolan Clines MD;  [facility]  Service: Urology;  Laterality: N/A;   ESOPHAGOGASTRODUODENOSCOPY  last one 11-22-2014   LEFT ATRIAL APPENDAGE OCCLUSION N/A 07/09/2021   Procedure: LEFT ATRIAL APPENDAGE OCCLUSION;  Surgeon: LVickie Epley MD;  [facility];  Service: Cardiovascular;  Laterality: N/A;    POLYPECTOMY  11/19/2021   Procedure: POLYPECTOMY;  Surgeon: SLadene Artist MD;  [facility];  Service: Gastroenterology;;   TEE WITHOUT CARDIOVERSION N/A 07/09/2021   Procedure: TRANSESOPHAGEAL ECHOCARDIOGRAM (TEE);  Surgeon: LVickie Epley MD;  [facility];  Service: Cardiovascular;  Laterality: N/A;   TEE WITHOUT CARDIOVERSION N/A 08/20/2021   Procedure: TRANSESOPHAGEAL ECHOCARDIOGRAM (TEE);  Surgeon: CWerner Lean MD;  [facility];  Service: Cardiovascular;  Laterality: N/A;   TRANSTHORACIC ECHOCARDIOGRAM  09/17/2014   ef 50-55%/  trivial AR, MR and PR/  mild LAE and RAE/  mild TR    Current Medications: Current Meds  Medication Sig   ascorbic acid (VITAMIN C) 1000 MG tablet Take 1,000 mg by mouth in the morning.   atorvastatin (LIPITOR) 20 MG tablet Take 1 tablet (20 mg total) by mouth every morning.   clopidogrel (PLAVIX) 75 MG tablet Take 1 tablet (75 mg total) by mouth daily.   Iron, Ferrous Sulfate, 325 (65 Fe) MG TABS Take 325 mg by mouth every other day.   losartan (COZAAR) 25 MG tablet TAKE 1/2 TABLET EVERY MORNING   metoprolol succinate (TOPROL-XL) 50 MG 24 hr tablet TAKE 1 TABLET EVERY DAY WITH OR IMMEDIATELY FOLLOWING A MEAL   Multiple Minerals-Vitamins (CALCIUM 600+D3 PLUS MINERALS) TABS Take 1 tablet by mouth in the morning.   Multiple Vitamin (MULTIVITAMIN WITH MINERALS) TABS tablet Take 1 tablet by mouth daily. Centrum   Omega-3 Fatty Acids (FISH OIL PO) Take 1 capsule by mouth in the morning.   tamsulosin (FLOMAX) 0.4 MG CAPS capsule TAKE 2 CAPSULES (0.8 MG TOTAL) BY MOUTH DAILY.     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    Retired tAdministrator gets regular exercise- walking.    [place]  Social Determinants of Health   Financial Resource Strain: Low  Risk  (11/04/2021)   Overall Financial Resource Strain (CARDIA)    Difficulty of Paying Living Expenses: Not hard at all  Food Insecurity: No Food Insecurity (11/04/2021)   Hunger Vital Sign    Worried About Running Out of Food in the Last Year: Never true    Ran Out of Food in the Last Year: Never true  Transportation Needs: No Transportation Needs (11/04/2021)   PRAPARE - Hydrologist (Medical): No    Lack of Transportation (Non-Medical): No  Physical Activity: Insufficiently Active (11/04/2021)   Exercise Vital Sign    Days of Exercise per Week: 4 days    Minutes of Exercise per Session: 20 min  Stress: No Stress Concern Present (11/04/2021)   [place]    Feeling of Stress : Not at all  Social Connections: [place] (11/04/2021)   Social Connection and Isolation Panel [NHANES]    Frequency of Communication with Friends and Family: Three times a week    Frequency of Social Gatherings with Friends and Family: Once a week    Attends Religious Services: More than 4 times per year    Active Member of [employer] or Organizations: Yes    Attends Archivist Meetings: 1 to 4 times per year    Marital Status: Married     Family History: The patient's family history includes Colon cancer (age of onset: 66) in his mother; Colon polyps in his brother; Heart disease in his sister; Hypertension in his sister and sister; Kidney disease in  his sister; Lung cancer in his brother. There is no history of Diabetes, Depression, Stroke, Alcohol abuse, Prostate cancer, Liver cancer, Rectal cancer, or Stomach cancer.  ROS:   Please see the history of present illness.    All other systems reviewed and are negative.  EKGs/Labs/Other Studies Reviewed:    The following studies were reviewed today:  LAAO 07/09/21:  CONCLUSIONS:  1.Successful implantation of a WATCHMAN left atrial appendage occlusive device    2. TEE demonstrating no LAA thrombus 3. No early apparent complications.    Post Implant Anticoagulation Strategy: Continue xarelto 53m by mouth daily for the 45 days after implant. If 45-day TEE criteria are met, transition to plavix 737mby mouth daily to complete 6 months of post-implant therapy.     EKG:  EKG is ordered today. This shows NSR HR 63  Recent Labs: 11/23/2021: BUN 10; Creatinine, Ser 0.87; Hemoglobin 11.6; Platelets 299.0; Potassium 4.5; Sodium 137 11/30/2021: ALT 19  Recent Lipid Panel    Component Value Date/Time   CHOL 123 11/30/2021 0948   TRIG 142.0 11/30/2021 0948   HDL 26.40 (L) 11/30/2021 0948   CHOLHDL 5 11/30/2021 0948   VLDL 28.4 11/30/2021 0948   LDLCALC 68 11/30/2021 0948   Physical Exam:    VS:  BP 120/60   Pulse 63   Ht 6' (1.829 m)   Wt 202 lb 3.2 oz (91.7 kg)   SpO2 98%   BMI 27.42 kg/m     Wt Readings from Last 3 Encounters:  01/01/22 202 lb 3.2 oz (91.7 kg)  11/30/21 198 lb (89.8 kg)  11/19/21 197 lb (89.4 kg)    General: Well developed, well nourished, NAD Lungs:Clear to ausculation bilaterally. No wheezes, rales, or rhonchi. Breathing is unlabored. Cardiovascular: RRR with S1 S2. No murmurs Extremities: No edema.  Neuro: Alert and oriented. No focal deficits. No facial asymmetry. MAE spontaneously. Psych: Responds to  questions appropriately with normal affect.    ASSESSMENT/PLAN:    Paroxysmal Atrial Fibrillation/atrial flutter s/p ablation and Watchman: The patient  underwent LAAO placement with Watchman FLX 104m with Dr. LQuentin Ore3/2/23. TEE  08/20/21 showed a small device leak and plan was for follow up TEE, which is set up for 01/08/22. Continue on aspirin and plavix until we have the results of this TEE.   After careful review of history and examination, the risks and benefits of transesophageal echocardiogram have been explained including risks of esophageal damage, perforation (1:10,000 risk), bleeding, pharyngeal hematoma as well as other potential complications associated with conscious sedation including aspiration, arrhythmia, respiratory failure and death. Alternatives to treatment were discussed, questions were answered. Patient is willing to proceed.   CAD: No anginal symptoms.    HTN: Stable, no changes today.   Medication Adjustments/Labs and Tests Ordered: Current medicines are reviewed at length with the patient today.  Concerns regarding medicines are outlined above.  Orders Placed This Encounter  Procedures   CBC   Basic metabolic panel   EKG 192-HVFM  No orders of the defined types were placed in this encounter.   Patient Instructions  Medication Instructions:  Your physician recommends that you continue on your current medications as directed. Please refer to the Current Medication list given to you today.  *If you need a refill on your cardiac medications before your next appointment, please call your pharmacy*   Lab Work: TODAY: BMP, CBC If you have labs (blood work) drawn today and your tests are completely normal, you will receive your results only by: [place](if you have MyChart) OR A paper copy in the mail If you have any lab test that is abnormal or we need to change your treatment, we will call you to review the results.   Testing/Procedures: NONE   Follow-Up:To be determined  [facility] our [facility].  As part of our continuing mission to provide you with exceptional  heart care, we have created designated Provider Care Teams.  These Care Teams include your primary Cardiologist (physician) and Advanced Practice Providers (APPs -  Physician Assistants and Nurse Practitioners) who all work together to provide you with the care you need, when you need it.  We recommend signing up for the patient portal called "MyChart".  Sign up information is provided on this After Visit Summary.  MyChart is used to connect with patients for Virtual Visits (Telemedicine).  Patients are able to view lab/test results, encounter notes, upcoming appointments, etc.  Non-urgent messages can be sent to your provider as well.   To learn more about what you can do with MyChart, go to [email or web address]      Important Information About Sugar         Signed, KAngelena Form PA-C  01/01/2022 11:43 AM    [facility]

## 2022-01-01 NOTE — Patient Instructions (Signed)
Medication Instructions:  Your physician recommends that you continue on your current medications as directed. Please refer to the Current Medication list given to you today.  *If you need a refill on your cardiac medications before your next appointment, please call your pharmacy*   Lab Work: TODAY: BMP, CBC If you have labs (blood work) drawn today and your tests are completely normal, you will receive your results only by: [place] (if you have MyChart) OR A paper copy in the mail If you have any lab test that is abnormal or we need to change your treatment, we will call you to review the results.   Testing/Procedures: NONE   Follow-Up:To be determined  At [facility], you and your health needs are our priority.  As part of our continuing mission to provide you with exceptional heart care, we have created designated Provider Care Teams.  These Care Teams include your primary Cardiologist (physician) and Advanced Practice Providers (APPs -  Physician Assistants and Nurse Practitioners) who all work together to provide you with the care you need, when you need it.  We recommend signing up for the patient portal called "MyChart".  Sign up information is provided on this After Visit Summary.  MyChart is used to connect with patients for Virtual Visits (Telemedicine).  Patients are able to view lab/test results, encounter notes, upcoming appointments, etc.  Non-urgent messages can be sent to your provider as well.   To learn more about what you can do with MyChart, go to [email or web address].      Important Information About Sugar

## 2022-01-02 LAB — BASIC METABOLIC PANEL
BUN/Creatinine Ratio: 13 (ref 10–24)
BUN: 11 mg/dL (ref 8–27)
CO2: 25 mmol/L (ref 20–29)
Calcium: 9.3 mg/dL (ref 8.6–10.2)
Chloride: 101 mmol/L (ref 96–106)
Creatinine, Ser: 0.86 mg/dL (ref 0.76–1.27)
Glucose: 139 mg/dL — ABNORMAL HIGH (ref 70–99)
Potassium: 4.3 mmol/L (ref 3.5–5.2)
Sodium: 139 mmol/L (ref 134–144)
eGFR: 90 mL/min/{1.73_m2} (ref >59)

## 2022-01-02 LAB — CBC
Hematocrit: 34.8 % — ABNORMAL LOW (ref 37.5–51.0)
Hemoglobin: 11 g/dL — ABNORMAL LOW (ref 13.0–17.7)
MCH: 25.5 pg — ABNORMAL LOW (ref 26.6–33.0)
MCHC: 31.6 g/dL (ref 31.5–35.7)
MCV: 81 fL (ref 79–97)
NRBC: 1 % — ABNORMAL HIGH (ref 0–0)
Platelets: 318 10*3/uL (ref 150–450)
RBC: 4.32 x10E6/uL (ref 4.14–5.80)
RDW: 20.5 % — ABNORMAL HIGH (ref 11.6–15.4)
WBC: 4.4 10*3/uL (ref 3.4–10.8)

## 2022-01-06 ENCOUNTER — Other Ambulatory Visit: Payer: Self-pay | Admitting: Family Medicine

## 2022-01-06 DIAGNOSIS — I429 Cardiomyopathy, unspecified: Secondary | ICD-10-CM

## 2022-01-06 DIAGNOSIS — E7800 Pure hypercholesterolemia, unspecified: Secondary | ICD-10-CM

## 2022-01-08 ENCOUNTER — Ambulatory Visit (HOSPITAL_COMMUNITY): Payer: Medicare Other | Admitting: Anesthesiology

## 2022-01-08 ENCOUNTER — Encounter (HOSPITAL_COMMUNITY)
Admission: RE | Disposition: A | Discharge status: Home / Self Care | Payer: Self-pay | Source: Home / Self Care | Attending: Cardiovascular Disease

## 2022-01-08 ENCOUNTER — Other Ambulatory Visit: Payer: Self-pay

## 2022-01-08 ENCOUNTER — Ambulatory Visit (HOSPITAL_COMMUNITY)
Admission: RE | Admit: 2022-01-08 | Discharge: 2022-01-08 | Disposition: A | Discharge status: Home / Self Care | Payer: Medicare Other | Attending: Cardiovascular Disease | Admitting: Cardiovascular Disease

## 2022-01-08 ENCOUNTER — Ambulatory Visit (HOSPITAL_BASED_OUTPATIENT_CLINIC_OR_DEPARTMENT_OTHER)
Admission: RE | Admit: 2022-01-08 | Discharge: 2022-01-08 | Disposition: A | Discharge status: Home / Self Care | Payer: Medicare Other | Source: Ambulatory Visit | Attending: Cardiology | Admitting: Cardiology

## 2022-01-08 ENCOUNTER — Encounter (HOSPITAL_COMMUNITY): Payer: Self-pay | Admitting: Cardiovascular Disease

## 2022-01-08 ENCOUNTER — Ambulatory Visit (HOSPITAL_BASED_OUTPATIENT_CLINIC_OR_DEPARTMENT_OTHER): Payer: Medicare Other | Admitting: Anesthesiology

## 2022-01-08 DIAGNOSIS — M1990 Unspecified osteoarthritis, unspecified site: Secondary | ICD-10-CM

## 2022-01-08 DIAGNOSIS — I480 Paroxysmal atrial fibrillation: Secondary | ICD-10-CM | POA: Insufficient documentation

## 2022-01-08 DIAGNOSIS — Z7902 Long term (current) use of antithrombotics/antiplatelets: Secondary | ICD-10-CM | POA: Diagnosis not present

## 2022-01-08 DIAGNOSIS — Z95818 Presence of other cardiac implants and grafts: Secondary | ICD-10-CM

## 2022-01-08 DIAGNOSIS — T82538A Leakage of other cardiac and vascular devices and implants, initial encounter: Secondary | ICD-10-CM | POA: Diagnosis not present

## 2022-01-08 DIAGNOSIS — I083 Combined rheumatic disorders of mitral, aortic and tricuspid valves: Secondary | ICD-10-CM | POA: Diagnosis not present

## 2022-01-08 DIAGNOSIS — Z7982 Long term (current) use of aspirin: Secondary | ICD-10-CM | POA: Insufficient documentation

## 2022-01-08 DIAGNOSIS — I2510 Atherosclerotic heart disease of native coronary artery without angina pectoris: Secondary | ICD-10-CM | POA: Diagnosis not present

## 2022-01-08 DIAGNOSIS — I4892 Unspecified atrial flutter: Secondary | ICD-10-CM | POA: Diagnosis not present

## 2022-01-08 DIAGNOSIS — I10 Essential (primary) hypertension: Secondary | ICD-10-CM | POA: Diagnosis not present

## 2022-01-08 DIAGNOSIS — I4891 Unspecified atrial fibrillation: Secondary | ICD-10-CM

## 2022-01-08 DIAGNOSIS — D649 Anemia, unspecified: Secondary | ICD-10-CM | POA: Diagnosis not present

## 2022-01-08 DIAGNOSIS — I340 Nonrheumatic mitral (valve) insufficiency: Secondary | ICD-10-CM

## 2022-01-08 HISTORY — PX: TEE WITHOUT CARDIOVERSION: SHX5443

## 2022-01-08 SURGERY — ECHOCARDIOGRAM, TRANSESOPHAGEAL
Anesthesia: Monitor Anesthesia Care

## 2022-01-08 MED ORDER — SODIUM CHLORIDE 0.9 % IV SOLN: INTRAVENOUS | Status: DC

## 2022-01-08 MED ORDER — LIDOCAINE 2% (20 MG/ML) 5 ML SYRINGE
INTRAMUSCULAR | Status: DC | PRN
  Administered 2022-01-08: 80 mg via INTRAVENOUS

## 2022-01-08 MED ORDER — PROPOFOL 500 MG/50ML IV EMUL
INTRAVENOUS | Status: DC | PRN
  Administered 2022-01-08: 125 ug/kg/min via INTRAVENOUS

## 2022-01-08 MED ORDER — PROPOFOL 10 MG/ML IV BOLUS
INTRAVENOUS | Status: DC | PRN
  Administered 2022-01-08 (×2): 20 mg via INTRAVENOUS

## 2022-01-08 NOTE — Anesthesia Postprocedure Evaluation (Signed)
Anesthesia Post Note  Patient: Paul Fry  Procedure(s) Performed: TRANSESOPHAGEAL ECHOCARDIOGRAM (TEE)     Patient location during evaluation: PACU Anesthesia Type: MAC Level of consciousness: awake and alert Pain management: pain level controlled Vital Signs Assessment: post-procedure vital signs reviewed and stable Respiratory status: spontaneous breathing, nonlabored ventilation and respiratory function stable Cardiovascular status: blood pressure returned to baseline and stable Postop Assessment: no apparent nausea or vomiting Anesthetic complications: no   No notable events documented.  Last Vitals:  Vitals:   01/08/22 1030 01/08/22 1040  BP: 133/72 135/72  Pulse: (!) 59 (!) 59  Resp: 18 12  Temp:    SpO2: 97% 100%    Last Pain:  Vitals:   01/08/22 1040  TempSrc:   PainSc: 0-No pain                 Pervis Hocking

## 2022-01-08 NOTE — CV Procedure (Signed)
TEE:  31 mm Watchman FLX device No further migration since last TEE Still with small leak on lateral side Sub optimal compression due to migration  Normal EF Mild MR AV sclerosis mild AR No effusion Mild TR No residual ASD post trans septal  See full report in [place]  Jenkins Rouge MD [facility]

## 2022-01-08 NOTE — Progress Notes (Signed)
Echocardiogram Echocardiogram Transesophageal has been performed.  Paul Fry 01/08/2022, 11:14 AM

## 2022-01-08 NOTE — Transfer of Care (Signed)
Immediate Anesthesia Transfer of Care Note  Patient: Paul Fry  Procedure(s) Performed: TRANSESOPHAGEAL ECHOCARDIOGRAM (TEE)  Patient Location: Endoscopy Unit  Anesthesia Type:MAC  Level of Consciousness: drowsy and patient cooperative  Airway & Oxygen Therapy: Patient Spontanous Breathing and Patient connected to nasal cannula oxygen  Post-op Assessment: Report given to RN and Post -op Vital signs reviewed and stable  Post vital signs: Reviewed and stable  Last Vitals:  Vitals Value Taken Time  BP 112/64   Temp    Pulse 60 01/08/22 1019  Resp 14 01/08/22 1019  SpO2 97 % 01/08/22 1019  Vitals shown include unvalidated device data.  Last Pain:  Vitals:   01/08/22 0858  TempSrc: Temporal  PainSc: 0-No pain         Complications: No notable events documented.

## 2022-01-08 NOTE — Interval H&P Note (Signed)
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  The various methods of treatment have been discussed with the patient and family. After consideration of risks, benefits and other options for treatment, the patient has consented to  Procedure(s): TRANSESOPHAGEAL ECHOCARDIOGRAM (TEE) (N/A) as a surgical intervention.  The patient's history has been reviewed, patient examined, no change in status, stable for surgery.  I have reviewed the patient's chart and labs.  Questions were answered to the patient's satisfaction.     Jenkins Rouge

## 2022-01-08 NOTE — Anesthesia Preprocedure Evaluation (Addendum)
Anesthesia Evaluation  Patient identified by MRN, date of birth, ID band Patient awake    Reviewed: Allergy & Precautions, NPO status , Patient's Chart, lab work & pertinent test results, reviewed documented beta blocker date and time   Airway Mallampati: III  TM Distance: >3 FB Neck ROM: Full    Dental  (+) Missing, Dental Advisory Given,    Pulmonary neg pulmonary ROS,    Pulmonary exam normal breath sounds clear to auscultation       Cardiovascular hypertension (127/62 in preop), Pt. on medications and Pt. on home beta blockers + CAD and + DOE  Normal cardiovascular exam+ Valvular Problems/Murmurs (s/p watchman)  Rhythm:Regular Rate:Normal  Echo 08/2021 1. Follow up from Watchman 31 mm FLX device. There is proximal migration  of the device within the left atrial appendage. There is a small, 1.85 mm,  peri-device leak. There is an increased mitral shoulder of the device from  prior.  2. Right ventricular systolic function is hyperdynamic. The right  ventricular size is normal.  3. Left ventricular ejection fraction, by estimation, is 55 to 60%. The  left ventricle has normal function.  4. The mitral valve is abnormal. Trivial mitral valve regurgitation. No  evidence of mitral stenosis.  5. The tricuspid valve is abnormal.  6. The aortic valve is tricuspid. Aortic valve regurgitation is mild. No  aortic stenosis is present.    Neuro/Psych negative neurological ROS  negative psych ROS   GI/Hepatic Neg liver ROS, hiatal hernia, GERD  Controlled,  Endo/Other  neg diabetesa1c 6.7  Renal/GU negative Renal ROS Bladder dysfunction      Musculoskeletal  (+) Arthritis , Osteoarthritis,    Abdominal   Peds  Hematology  (+) Blood dyscrasia, anemia , Hb 11   Anesthesia Other Findings   Reproductive/Obstetrics negative OB ROS                            Anesthesia Physical Anesthesia  Plan  ASA: 3  Anesthesia Plan: MAC   Post-op Pain Management:    Induction:   PONV Risk Score and Plan: 2 and Propofol infusion and TIVA  Airway Management Planned: Natural Airway and Simple Face Mask  Additional Equipment: None  Intra-op Plan:   Post-operative Plan:   Informed Consent: I have reviewed the patients History and Physical, chart, labs and discussed the procedure including the risks, benefits and alternatives for the proposed anesthesia with the patient or authorized representative who has indicated his/her understanding and acceptance.       Plan Discussed with: CRNA  Anesthesia Plan Comments: (Airway 07/2021 Ventilation: Mask ventilation without difficulty Laryngoscope Size: Glidescope and 4 Grade View: Grade I Tube type: Oral Tube size: 7.5 mm Number of attempts: 1)       Anesthesia Quick Evaluation

## 2022-01-11 ENCOUNTER — Encounter (HOSPITAL_COMMUNITY): Payer: Self-pay | Admitting: Cardiovascular Disease

## 2022-01-12 ENCOUNTER — Encounter: Payer: Self-pay | Admitting: Family Medicine

## 2022-01-12 ENCOUNTER — Telehealth: Payer: Self-pay | Admitting: Cardiology

## 2022-01-12 ENCOUNTER — Ambulatory Visit (INDEPENDENT_AMBULATORY_CARE_PROVIDER_SITE_OTHER): Payer: Medicare Other | Admitting: Family Medicine

## 2022-01-12 VITALS — BP 112/80 | HR 83 | Temp 97.4°F | Ht 72.0 in | Wt 200.0 lb

## 2022-01-12 DIAGNOSIS — R195 Other fecal abnormalities: Secondary | ICD-10-CM

## 2022-01-12 DIAGNOSIS — I2510 Atherosclerotic heart disease of native coronary artery without angina pectoris: Secondary | ICD-10-CM

## 2022-01-12 MED ORDER — LOPERAMIDE HCL 2 MG PO TABS: 2.0000 mg | ORAL_TABLET | Freq: Every day | ORAL | Status: DC | PRN

## 2022-01-12 MED ORDER — CLOPIDOGREL BISULFATE 75 MG PO TABS: 75.0000 mg | ORAL_TABLET | Freq: Every day | ORAL | 2 refills | Status: DC

## 2022-01-12 NOTE — Telephone Encounter (Signed)
-----   Message from Vickie Epley, MD sent at 01/12/2022 12:18 PM EDT ----- Regarding: RE: GI bleeding with ASA If he tolerates the plavix, can continue this in place of the aspirin. Leak is not significant. Thanks, Lysbeth Galas    ----- Message ----- From: Tommie Raymond, NP Sent: 01/12/2022  10:13 AM EDT To: Theodoro Parma, RN; Vickie Epley, MD Subject: GI bleeding with ASA                           Called Mr. Meaders. He states that he has had multiple GI bleeds while on ASA in the past. He is tolerating Plavix '75mg'$  with no issues. His TEE shows a miniscule leak which is improved from prior imaging. Would you like for him to stay on the Plavix or stop it altogether?   Thanks JM       ----- Message ----- From: Vickie Epley, MD Sent: 01/08/2022   6:42 PM EDT To: Tommie Raymond, NP; Theodoro Parma, RN  CT looks good. Device in good position.  OK to stop plavix and continue on aspirin '81mg'$  PO daily.  Lysbeth Galas T. Quentin Ore, MD, [facility], [facility] Cardiac Electrophysiology

## 2022-01-12 NOTE — Progress Notes (Unsigned)
Loose stools.  Going on for months.   He can leak stool.  He tried benefiber w/o relief.  Colonoscopy 2023.  No blood in stool.  He tried cutting out dairy with some improvement.  No one else with similar.  He is still using benefiber.  Stools can be greasy.   No abd pain.    H/o treatment for H pylori.  Abdomen nontender.  No belly pain.  D/w pt about plavix use vs aspirin.  On plavix, not on asa.  H/o GI bleed on aspirin prev.  D/w pt about prev cardiology recs.   Meds, vitals, and allergies reviewed.   ROS: Per HPI unless specifically indicated in ROS section   GEN: nad, alert and oriented HEENT: ncat NECK: supple w/o LA CV: rrr. PULM: ctab, no inc wob ABD: soft, +bs, nontender. EXT: no edema SKIN: well pefused.

## 2022-01-12 NOTE — Patient Instructions (Signed)
Go to the lab on the way out.   If you have mychart we'll likely use that to update you.     Stool studies.   Then try taking 2 imodium a day.   Stay off all dairy.   We may need to have you see the GI clinic if not better.  Take care.  Glad to see you.

## 2022-01-12 NOTE — Telephone Encounter (Signed)
  [facility]ENTER   MULTIDISCIPLINARY HEART TEAM   Spoke to the patient after clarification from Dr. Quentin Ore regarding antiplatelet therapy s/p Watchman implant. Given residual small leak on TEE, plan to continue Plavix '75mg'$  QD. Reports prior GI bleed with ASA. No longer requires SBE for dental cleanings and dental procedures. Will see him back in 6 months. Will clarify duration of Plavix therapy however anticipate discontinuation at follow up. Patient understands and agrees with the plan.   Kathyrn Drown NP-C Structural Heart Team  Pager: [phone] Phone: [phone]

## 2022-01-13 DIAGNOSIS — R195 Other fecal abnormalities: Secondary | ICD-10-CM | POA: Insufficient documentation

## 2022-01-13 NOTE — Assessment & Plan Note (Signed)
Stool studies.  To exclude pancreatic insufficiency, infectious source. Consider imodium if needed. Stay off all dairy.   Then refer back to GI if needed.   See notes on labs.  He agrees to plan.

## 2022-01-14 DIAGNOSIS — R197 Diarrhea, unspecified: Secondary | ICD-10-CM | POA: Diagnosis not present

## 2022-01-14 DIAGNOSIS — R195 Other fecal abnormalities: Secondary | ICD-10-CM | POA: Diagnosis not present

## 2022-01-14 NOTE — Addendum Note (Signed)
Addended by: Ellamae Sia on: 01/14/2022 11:15 AM   Modules accepted: Orders

## 2022-01-17 LAB — FECAL FAT, QUALITATIVE: FECAL FAT, QUALITATIVE: NORMAL

## 2022-01-17 LAB — GASTROINTESTINAL PATHOGEN PNL
CampyloBacter Group: NOT DETECTED
Norovirus GI/GII: NOT DETECTED
Rotavirus A: NOT DETECTED
Salmonella species: NOT DETECTED
Shiga Toxin 1: NOT DETECTED
Shiga Toxin 2: NOT DETECTED
Shigella Species: NOT DETECTED
Vibrio Group: NOT DETECTED
Yersinia enterocolitica: NOT DETECTED

## 2022-01-17 LAB — C. DIFFICILE GDH AND TOXIN A/B
GDH ANTIGEN: NOT DETECTED
MICRO NUMBER:: 13885560
SPECIMEN QUALITY:: ADEQUATE
TOXIN A AND B: NOT DETECTED

## 2022-01-20 LAB — PANCREATIC ELASTASE, FECAL: Pancreatic Elastase-1, Stool: >500 mcg/g

## 2022-02-02 NOTE — Addendum Note (Signed)
Encounter addended by: Adrian Prince, RDCS on: 02/02/2022 1:11 PM  Actions taken: Imaging Exam ended

## 2022-02-16 DIAGNOSIS — Z23 Encounter for immunization: Secondary | ICD-10-CM | POA: Diagnosis not present

## 2022-03-29 DIAGNOSIS — R3912 Poor urinary stream: Secondary | ICD-10-CM | POA: Diagnosis not present

## 2022-03-29 DIAGNOSIS — N401 Enlarged prostate with lower urinary tract symptoms: Secondary | ICD-10-CM | POA: Diagnosis not present

## 2022-06-07 ENCOUNTER — Ambulatory Visit (INDEPENDENT_AMBULATORY_CARE_PROVIDER_SITE_OTHER): Payer: Medicare Other | Admitting: Family Medicine

## 2022-06-07 ENCOUNTER — Encounter: Payer: Self-pay | Admitting: Family Medicine

## 2022-06-07 VITALS — BP 110/68 | HR 73 | Temp 97.8°F | Ht 72.0 in | Wt 199.0 lb

## 2022-06-07 DIAGNOSIS — D649 Anemia, unspecified: Secondary | ICD-10-CM | POA: Diagnosis not present

## 2022-06-07 DIAGNOSIS — E119 Type 2 diabetes mellitus without complications: Secondary | ICD-10-CM

## 2022-06-07 LAB — BASIC METABOLIC PANEL
BUN: 12 mg/dL (ref 6–23)
CO2: 28 mEq/L (ref 19–32)
Calcium: 9 mg/dL (ref 8.4–10.5)
Chloride: 100 mEq/L (ref 96–112)
Creatinine, Ser: 0.81 mg/dL (ref 0.40–1.50)
GFR: 85.61 mL/min (ref >60.00)
Glucose, Bld: 142 mg/dL — ABNORMAL HIGH (ref 70–99)
Potassium: 4.3 mEq/L (ref 3.5–5.1)
Sodium: 136 mEq/L (ref 135–145)

## 2022-06-07 LAB — CBC WITH DIFFERENTIAL/PLATELET
Basophils Absolute: 0.1 10*3/uL (ref 0.0–0.1)
Basophils Relative: 1.1 % (ref 0.0–3.0)
Eosinophils Absolute: 0 10*3/uL (ref 0.0–0.7)
Eosinophils Relative: 0.3 % (ref 0.0–5.0)
HCT: 31.8 % — ABNORMAL LOW (ref 39.0–52.0)
Hemoglobin: 10.3 g/dL — ABNORMAL LOW (ref 13.0–17.0)
Lymphocytes Relative: 23.7 % (ref 12.0–46.0)
Lymphs Abs: 41.3 10*3/uL — ABNORMAL HIGH (ref 0.7–4.0)
MCHC: 32.2 g/dL (ref 30.0–36.0)
MCV: 79.9 fl (ref 78.0–100.0)
Monocytes Absolute: 0.2 10*3/uL (ref 0.1–1.0)
Monocytes Relative: 3.7 % (ref 3.0–12.0)
Neutro Abs: 3.8 10*3/uL (ref 1.4–7.7)
Neutrophils Relative %: 71.2 % (ref 43.0–77.0)
Platelets: 307 10*3/uL (ref 150.0–400.0)
RBC: 3.98 Mil/uL — ABNORMAL LOW (ref 4.22–5.81)
RDW: 25 % — ABNORMAL HIGH (ref 11.5–15.5)
WBC: 5.3 10*3/uL (ref 4.0–10.5)

## 2022-06-07 LAB — HEMOGLOBIN A1C: Hgb A1c MFr Bld: 6.6 % — ABNORMAL HIGH (ref 4.6–6.5)

## 2022-06-07 LAB — MICROALBUMIN / CREATININE URINE RATIO
Creatinine,U: 123.1 mg/dL
Microalb Creat Ratio: 0.6 mg/g (ref 0.0–30.0)
Microalb, Ur: <0.7 mg/dL (ref 0.0–1.9)

## 2022-06-07 LAB — IRON: Iron: 77 ug/dL (ref 42–165)

## 2022-06-07 MED ORDER — LOSARTAN POTASSIUM 25 MG PO TABS: ORAL_TABLET | ORAL | 3 refills | Status: DC

## 2022-06-07 NOTE — Patient Instructions (Signed)
Go to the lab on the way out.   If you have mychart we'll likely use that to update you.    Take care.  Glad to see you. Recheck at a yearly visit in about 6 months, labs ahead of time if possible.

## 2022-06-07 NOTE — Progress Notes (Signed)
Diabetes:  No meds.  Hypoglycemic episodes: no sx  Hyperglycemic episodes: no sx Feet problems:no Blood Sugars averaging: not checked.  eye exam within last year: pending Labs pending.    Loose stools resolved after stopping iron.    Meds, vitals, and allergies reviewed.   ROS: Per HPI unless specifically indicated in ROS section   GEN: nad, alert and oriented HEENT: ncat NECK: supple w/o LA CV: rrr. PULM: ctab, no inc wob ABD: soft, +bs EXT: no edema SKIN: well perfused.

## 2022-06-09 LAB — FRUCTOSAMINE: Fructosamine: 295 umol/L — ABNORMAL HIGH (ref 205–285)

## 2022-06-09 NOTE — Assessment & Plan Note (Signed)
Recheck at a yearly visit in about 6 months, labs ahead of time if possible.  Continue work on diet and exercise.  See notes on labs.

## 2022-06-13 ENCOUNTER — Other Ambulatory Visit: Payer: Self-pay | Admitting: Family Medicine

## 2022-06-13 DIAGNOSIS — D649 Anemia, unspecified: Secondary | ICD-10-CM

## 2022-07-13 ENCOUNTER — Other Ambulatory Visit (INDEPENDENT_AMBULATORY_CARE_PROVIDER_SITE_OTHER): Payer: Medicare Other

## 2022-07-13 DIAGNOSIS — D649 Anemia, unspecified: Secondary | ICD-10-CM

## 2022-07-13 LAB — CBC WITH DIFFERENTIAL/PLATELET
Basophils Absolute: 0.1 10*3/uL (ref 0.0–0.1)
Basophils Relative: 1.1 % (ref 0.0–3.0)
Eosinophils Absolute: 0 10*3/uL (ref 0.0–0.7)
Eosinophils Relative: 0.4 % (ref 0.0–5.0)
HCT: 33.5 % — ABNORMAL LOW (ref 39.0–52.0)
Hemoglobin: 10.7 g/dL — ABNORMAL LOW (ref 13.0–17.0)
Lymphocytes Relative: 22.8 % (ref 12.0–46.0)
Lymphs Abs: 1.4 10*3/uL (ref 0.7–4.0)
MCHC: 32.1 g/dL (ref 30.0–36.0)
MCV: 79.3 fl (ref 78.0–100.0)
Monocytes Absolute: 0.3 10*3/uL (ref 0.1–1.0)
Monocytes Relative: 4.8 % (ref 3.0–12.0)
Neutro Abs: 4.4 10*3/uL (ref 1.4–7.7)
Neutrophils Relative %: 70.9 % (ref 43.0–77.0)
Platelets: 285 10*3/uL (ref 150.0–400.0)
RBC: 4.22 Mil/uL (ref 4.22–5.81)
RDW: 25 % — ABNORMAL HIGH (ref 11.5–15.5)
WBC: 6.2 10*3/uL (ref 4.0–10.5)

## 2022-07-13 LAB — IRON: Iron: 74 ug/dL (ref 42–165)

## 2022-07-21 NOTE — Addendum Note (Signed)
Addended by: [name] on: 07/21/2022 11:42 PM   Modules accepted: Orders

## 2022-07-22 ENCOUNTER — Telehealth: Payer: Self-pay | Admitting: Physician Assistant

## 2022-07-22 NOTE — Telephone Encounter (Signed)
scheduled per 3/13 referral , pt has been called and confirmed date and time. Pt is aware of location and to arrive early for check in

## 2022-07-26 ENCOUNTER — Ambulatory Visit (INDEPENDENT_AMBULATORY_CARE_PROVIDER_SITE_OTHER): Payer: Medicare Other

## 2022-07-26 ENCOUNTER — Ambulatory Visit: Payer: Medicare Other | Attending: Cardiology | Admitting: Cardiology

## 2022-07-26 VITALS — BP 138/62 | HR 72 | Ht 72.0 in | Wt 202.0 lb

## 2022-07-26 DIAGNOSIS — I10 Essential (primary) hypertension: Secondary | ICD-10-CM | POA: Diagnosis not present

## 2022-07-26 DIAGNOSIS — R002 Palpitations: Secondary | ICD-10-CM

## 2022-07-26 DIAGNOSIS — Z95818 Presence of other cardiac implants and grafts: Secondary | ICD-10-CM | POA: Insufficient documentation

## 2022-07-26 DIAGNOSIS — I480 Paroxysmal atrial fibrillation: Secondary | ICD-10-CM | POA: Diagnosis not present

## 2022-07-26 DIAGNOSIS — I498 Other specified cardiac arrhythmias: Secondary | ICD-10-CM

## 2022-07-26 MED ORDER — METOPROLOL SUCCINATE ER 50 MG PO TB24: 75.0000 mg | ORAL_TABLET | Freq: Every day | ORAL | 3 refills | Status: DC

## 2022-07-26 NOTE — Patient Instructions (Addendum)
Medication Instructions:  Your physician has recommended you make the following change in your medication:  STOP PLAVIX INCREASE TOPROL TO 75 MG DAILY    *If you need a refill on your cardiac medications before your next appointment, please call your pharmacy*   Lab Work: TODAY: MAGNESIUM  If you have labs (blood work) drawn today and your tests are completely normal, you will receive your results only by: [place] (if you have MyChart) OR A paper copy in the mail If you have any lab test that is abnormal or we need to change your treatment, we will call you to review the results.   Testing/Procedures: [place] [place]- Long Term Monitor Instructions  Your physician has requested you wear a ZIO patch monitor for 7 days.  This is a single patch monitor. [employer] supplies one patch monitor per enrollment. Additional stickers are not available. Please do not apply patch if you will be having a Nuclear Stress Test,  Echocardiogram, Cardiac CT, MRI, or Chest Xray during the period you would be wearing the  monitor. The patch cannot be worn during these tests. You cannot remove and re-apply the  ZIO XT patch monitor.  Your ZIO patch monitor will be mailed 3 day USPS to your address on file. It may take 3-5 days  to receive your monitor after you have been enrolled.  Once you have received your monitor, please review the enclosed instructions. Your monitor  has already been registered assigning a specific monitor serial # to you.  Billing and Patient Assistance Program Information  We have supplied [employer] with any of your insurance information on file for billing purposes. [employer] offers a sliding scale Patient Assistance Program for patients that do not have  insurance, or whose insurance does not completely cover the cost of the ZIO monitor.  You must apply for the Patient Assistance Program to qualify for this discounted rate.  To apply, please call [employer] at [phone], select option  4, select option 2, ask to apply for  Patient Assistance Program. Theodore Demark will ask your household income, and how many people  are in your household. They will quote your out-of-pocket cost based on that information.  [employer] will also be able to set up a 58-month, interest-free payment plan if needed.  Applying the monitor   Shave hair from upper left chest.  Hold abrader disc by orange tab. Rub abrader in 40 strokes over the upper left chest as  indicated in your monitor instructions.  Clean area with 4 enclosed alcohol pads. Let dry.  Apply patch as indicated in monitor instructions. Patch will be placed under collarbone on left  side of chest with arrow pointing upward.  Rub patch adhesive wings for 2 minutes. Remove white label marked "1". Remove the white  label marked "2". Rub patch adhesive wings for 2 additional minutes.  While looking in a mirror, press and release button in center of patch. A small green light will  flash 3-4 times. This will be your only indicator that the monitor has been turned on.  Do not shower for the first 24 hours. You may shower after the first 24 hours.  Press the button if you feel a symptom. You will hear a small click. Record Date, Time and  Symptom in the Patient Logbook.  When you are ready to remove the patch, follow instructions on the last 2 pages of Patient  Logbook. Stick patch monitor onto the last page of Patient Logbook.  Place Patient  Logbook in the blue and white box. Use locking tab on box and tape box closed  securely. The blue and white box has prepaid postage on it. Please place it in the mailbox as  soon as possible. Your physician should have your test results approximately 7 days after the  monitor has been mailed back to [facility]  Call [facility] at [phone] if you have questions regarding  your ZIO XT patch monitor. Call them immediately if you see an orange light blinking on your  monitor.  If  your monitor falls off in less than 4 days, contact our Monitor department at [phone].  If your monitor becomes loose or falls off after 4 days call [employer] at [phone] for  suggestions on securing your monitor    Follow-Up: At [facility], you and your health needs are our priority.  As part of our continuing mission to provide you with exceptional heart care, we have created designated Provider Care Teams.  These Care Teams include your primary Cardiologist (physician) and Advanced Practice Providers (APPs -  Physician Assistants and Nurse Practitioners) who all work together to provide you with the care you need, when you need it.  We recommend signing up for the patient portal called "MyChart".  Sign up information is provided on this After Visit Summary.  MyChart is used to connect with patients for Virtual Visits (Telemedicine).  Patients are able to view lab/test results, encounter notes, upcoming appointments, etc.  Non-urgent messages can be sent to your provider as well.   To learn more about what you can do with MyChart, go to [email or web address].    Your next appointment:   NEXT AVAILABLE   Provider:   You may see Vickie Epley, MD or one of the following Advanced Practice Providers on your designated Care Team:   Tommye Standard, [place] Beryle Beams" [place], [place], NP

## 2022-07-26 NOTE — Progress Notes (Unsigned)
Enrolled for [employer] to mail a ZIO XT long term holter monitor to the patients address on file.   Dr. Skains to read. 

## 2022-07-27 LAB — MAGNESIUM: Magnesium: 2.2 mg/dL (ref 1.6–2.3)

## 2022-07-27 NOTE — Progress Notes (Signed)
[facility]                                     Cardiology Office Note:    Date:  09/03/2022   ID:  Paul Fry, DOB Sep 10, 1945, MRN [id number]  PCP:  Joaquim Nam, MD  [facility] Cardiologist:  Donato Schultz, MD  [facility] Electrophysiologist:  Lanier Prude, MD   Referring MD: Joaquim Nam, MD   Chief Complaint  Patient presents with   Labs Only   Follow-up    1 year s/p LAAO     History of Present Illness:    Paul Fry is a 77 y.o. male with a hx of DM2, CAD, systolic dysfunction, atrial flutter/fibrillation s/p ablation, and GI bleeding secondary to anticoagulation who is s/p LAAO with Watchman (07/09/21) and presents to clinic for one year follow up.    [name] underwent atrial fibrillation/flutter ablation with Dr. Lalla Brothers 05/15/21. He was seen by the [facility] 06/12/21 for follow up and reported symptoms improvement.    He is now s/p LAAO closure with Watchman FLX 31mm device with Dr. Lalla Brothers. Follow up TEE on 08/20/21 showed a small device leak with repeat study showing no further  migration 1 cm mitral shoulder and persistent lateral lead 0.324 cm with  average compression 17%.    Today the patient presents to clinic for follow up. EKG shows bigeminal PACs therefore ZIO placed that showed  >5% PAC burden. Plan to increase Toprol to 75mg  daily. Denies CP, LE edema, orthopnea or PND. No dizziness or syncope. No blood in stool or urine. No palpitations.   Past Medical History:  Diagnosis Date   Allergy    SEASONAL   Anemia    Arthritis    KNEES   BPH (benign prostatic hyperplasia)    Difficult airway for intubation    Difficult airway for intubation 08/25/2021   Diverticulosis of colon    Elevated PSA    1.22 on 01-20-2016   Feeling of incomplete bladder emptying    GERD (gastroesophageal reflux disease)    Hiatal hernia    History of adenomatous polyp of colon    2014 tubular adenoma   History of chronic gastritis     History of Helicobacter pylori infection    07/ 2016   History of lower GI bleeding    07/ 2010 and 12/ 2011  diverticular bleed both times   Hyperlipidemia    Hypertension    Lower urinary tract symptoms (LUTS)    Nonischemic cardiomyopathy (HCC)    Plantar fascia syndrome    Presence of Watchman left atrial appendage closure device 07/09/2021   Watchman FLX 31mm with Dr. Lalla Brothers   Strains to urinate    INTERMITTANT   Type 2 diabetes, diet controlled (HCC)    ON BORDER LINE,NEVER TOOK MEDICATION.   Wears glasses     Past Surgical History:  Procedure Laterality Date   APPENDECTOMY  teenager   ATRIAL FIBRILLATION ABLATION N/A 05/15/2021   Procedure: ATRIAL FIBRILLATION ABLATION;  Surgeon: Lanier Prude, MD;  Location: [facility];  Service: Cardiovascular;  Laterality: N/A;   BIOPSY  11/19/2021   Procedure: BIOPSY;  Surgeon: Meryl Dare, MD;  Location: [facility] ENDOS[facility];  Service: Gastroenterology;;   CARDIAC CATHETERIZATION  09-10-2008   dr dalton mclean   mild non-obstructive CAD;  30-40% mRCA, 20%pLAD,  ef  55%   CARDIOVASCULAR STRESS TEST  07/29/2011   Low risk nuclear study w/ no ischemia or infarct/  LVEF 47% and  apical hypokinesis (08-07-2012 cardiac MRI -- EF 49% w/ no hyperenhancement distal septal and apical hypokinesis, mild LAE, mild RVE, mild AR with mild dilated sinus 39mm)   CATARACT EXTRACTION W/ INTRAOCULAR LENS  IMPLANT, BILATERAL  2016   COLONOSCOPY  last one 12-21-2012   COLONOSCOPY WITH PROPOFOL N/A 11/19/2021   Procedure: COLONOSCOPY WITH PROPOFOL;  Surgeon: Meryl Dare, MD;  Location: [facility];  Service: Gastroenterology;  Laterality: N/A;   CYSTOSCOPY WITH INSERTION OF UROLIFT N/A 06/17/2016   Procedure: CYSTOSCOPY WITH INSERTION OF UROLIFT;  Surgeon: Jethro Bolus, MD;  Location: [facility];  Service: Urology;  Laterality: N/A;   ESOPHAGOGASTRODUODENOSCOPY  last one 11-22-2014   LEFT ATRIAL APPENDAGE OCCLUSION N/A  07/09/2021   Procedure: LEFT ATRIAL APPENDAGE OCCLUSION;  Surgeon: Lanier Prude, MD;  Location: [facility];  Service: Cardiovascular;  Laterality: N/A;   POLYPECTOMY  11/19/2021   Procedure: POLYPECTOMY;  Surgeon: Meryl Dare, MD;  Location: [facility];  Service: Gastroenterology;;   TEE WITHOUT CARDIOVERSION N/A 07/09/2021   Procedure: TRANSESOPHAGEAL ECHOCARDIOGRAM (TEE);  Surgeon: Lanier Prude, MD;  Location: [facility] INVASIVE CV LAB;  Service: Cardiovascular;  Laterality: N/A;   TEE WITHOUT CARDIOVERSION N/A 08/20/2021   Procedure: TRANSESOPHAGEAL ECHOCARDIOGRAM (TEE);  Surgeon: Christell Constant, MD;  Location: [facility] ENDOSCOPY;  Service: Cardiovascular;  Laterality: N/A;   TEE WITHOUT CARDIOVERSION N/A 01/08/2022   Procedure: TRANSESOPHAGEAL ECHOCARDIOGRAM (TEE);  Surgeon: Wendall Stade, MD;  Location: [facility] ENDOSCOPY;  Service: Cardiovascular;  Laterality: N/A;   TRANSTHORACIC ECHOCARDIOGRAM  09/17/2014   ef 50-55%/  trivial AR, MR and PR/  mild LAE and RAE/  mild TR    Current Medications: Current Meds  Medication Sig   ascorbic acid (VITAMIN C) 1000 MG tablet Take 1,000 mg by mouth in the morning.   atorvastatin (LIPITOR) 20 MG tablet TAKE 1 TABLET (20 MG TOTAL) BY MOUTH EVERY MORNING.   clopidogrel (PLAVIX) 75 MG tablet Take 1 tablet (75 mg total) by mouth daily.   loperamide (IMODIUM A-D) 2 MG tablet Take 1 tablet (2 mg total) by mouth daily as needed for diarrhea or loose stools.   losartan (COZAAR) 25 MG tablet TAKE 1/2 TABLET EVERY MORNING   Multiple Minerals-Vitamins (CALCIUM 600+D3 PLUS MINERALS) TABS Take 1 tablet by mouth in the morning.   Multiple Vitamin (MULTIVITAMIN WITH MINERALS) TABS tablet Take 1 tablet by mouth daily. Centrum   Omega-3 Fatty Acids (FISH OIL PO) Take 1 capsule by mouth in the morning.   tamsulosin (FLOMAX) 0.4 MG CAPS capsule TAKE 2 CAPSULES (0.8 MG TOTAL) BY MOUTH DAILY.   [DISCONTINUED] metoprolol succinate (TOPROL XL) 50 MG 24 hr  tablet Take 1.5 tablets (75 mg total) by mouth daily. Take with or immediately following a meal. (Patient taking differently: Take 50 mg by mouth daily. Take with or immediately following a meal. Currently taking one tablet PO daily due to tingling in rt arm)   [DISCONTINUED] metoprolol succinate (TOPROL-XL) 50 MG 24 hr tablet TAKE 1 TABLET EVERY DAY WITH OR IMMEDIATELY FOLLOWING A MEAL     Allergies:   Aspirin   Social History   Socioeconomic History   Marital status: Married    Spouse name: Not on file   Number of children: 0   Years of education: Not on file   Highest education level: Not on  file  Occupational History   Occupation: truck Hospital doctor- retired    Associate Professor: RETIRED  Tobacco Use   Smoking status: Never   Smokeless tobacco: Never  Vaping Use   Vaping Use: Never used  Substance and Sexual Activity   Alcohol use: Not Currently    Comment: rarely drinks beer   Drug use: No   Sexual activity: Not Currently  Other Topics Concern   Not on file  Social History Narrative   Married 2003, no children.    Retired Naval architect, gets regular exercise- walking.    Step daughter is MD in [place]   Social Determinants of Health   Financial Resource Strain: Low Risk  (11/04/2021)   Overall Financial Resource Strain (CARDIA)    Difficulty of Paying Living Expenses: Not hard at all  Food Insecurity: No Food Insecurity (08/17/2022)   Hunger Vital Sign    Worried About Running Out of Food in the Last Year: Never true    Ran Out of Food in the Last Year: Never true  Transportation Needs: No Transportation Needs (08/17/2022)   PRAPARE - Administrator, Civil Service (Medical): No    Lack of Transportation (Non-Medical): No  Physical Activity: Insufficiently Active (11/04/2021)   Exercise Vital Sign    Days of Exercise per Week: 4 days    Minutes of Exercise per Session: 20 min  Stress: No Stress Concern Present (11/04/2021)   [employer] of Occupational Health -  Occupational Stress Questionnaire    Feeling of Stress : Not at all  Social Connections: Socially Integrated (11/04/2021)   Social Connection and Isolation Panel [NHANES]    Frequency of Communication with Friends and Family: Three times a week    Frequency of Social Gatherings with Friends and Family: Once a week    Attends Religious Services: More than 4 times per year    Active Member of [employer] or Organizations: Yes    Attends Banker Meetings: 1 to 4 times per year    Marital Status: Married    Family History: The patient's family history includes Colon cancer (age of onset: 14) in his mother; Colon polyps in his brother; Heart disease in his sister; Hypertension in his sister and sister; Kidney disease in his sister; Lung cancer in his brother. There is no history of Diabetes, Depression, Stroke, Alcohol abuse, Prostate cancer, Liver cancer, Rectal cancer, or Stomach cancer.  ROS:   Please see the history of present illness.    All other systems reviewed and are negative.  EKGs/Labs/Other Studies Reviewed:    The following studies were reviewed today:  Long term monitor, 07/26/2022   Sinus Rhythm avg HR of 68 bpm   Very brief atrial flutter at 2am with variable conduction (<1% burden) - 5 min with avg rate 96 bpm   Freqent PAC's 5.5%, Rare PVC's   Post Watchman   TEE, 01/08/2022  1. Current study no further migration 1 cm mitral shoulder and persistent lateral lead 0.324 cm with average compression 17%.   2. Prior TEE 08/20/21 showed migration of device and compression only 8% Small 0.185 cm lateral leak.   3. Left ventricular ejection fraction, by estimation, is 55 to 60%. The left ventricle has normal function.   4. Right ventricular systolic function is normal. The right ventricular size is normal.   5. Left atrial size was moderately dilated. No left atrial/left atrial appendage thrombus was detected.   6. Right atrial size was moderately dilated.  7. The mitral  valve is abnormal. Mild mitral valve regurgitation.   8. The aortic valve is tricuspid. There is mild calcification of the aortic valve. Aortic valve regurgitation is mild. Aortic valve sclerosis is present, with no evidence of aortic valve stenosis.    Myoview spect, 11/05/2021   The study is normal. The study is low risk.   No ST deviation was noted.   LV perfusion is normal. There is no evidence of ischemia. There is no evidence of infarction.   Left ventricular function is normal. Nuclear stress EF: 54 %. The left ventricular ejection fraction is mildly decreased (45-54%). End diastolic cavity size is normal. End systolic cavity size is normal.   Prior study available for comparison from 07/30/2011.   Normal resting and stress perfusion. No ischemia or infarction EF 54%   TEE, 08/20/2021  1. Follow up from Watchman 31 mm FLX device. There is proximal migration of the device within the left atrial appendage. There is a small, 1.85 mm, peri-device leak. There is an increased mitral shoulder of the device from prior.   2. Right ventricular systolic function is hyperdynamic. The right ventricular size is normal.   3. Left ventricular ejection fraction, by estimation, is 55 to 60%. The left ventricle has normal function.   4. The mitral valve is abnormal. Trivial mitral valve regurgitation. No evidence of mitral stenosis.   5. The tricuspid valve is abnormal.   6. The aortic valve is tricuspid. Aortic valve regurgitation is mild. No aortic stenosis is present.    LAAO, 07/09/2021 1.Successful implantation of a WATCHMAN left atrial appendage occlusive device    2. TEE demonstrating no LAA thrombus 3. No early apparent complications.    CT cadiac, 05/08/2021 1. The left atrial appendage is a large chicken wing morphology without thrombus. 2. A 31 mm watchman FLX device is recommended based on the above landing zone measurements (24.7 mm maximum diameter; 20% compression). 3. An inferior posterior  IAS puncture site is recommended.  5. Optimal deployment angle: RAO 3 CRA 8  6. CAC score of 576, which is 83rd percentile for age-, sex-, and race-matched controls CT chest over-read IMPRESSION: 1. Aortic Atherosclerosis (ICD10-I70.0).   EKG:  EKG is ordered today.  The ekg ordered today demonstrates NSR with bigeminy  Recent Labs: 07/26/2022: Magnesium 2.2 08/17/2022: ALT 18; BUN 8; Creatinine 0.83; Hemoglobin 10.0; Platelet Count 269; Potassium 4.0; Sodium 137   Recent Lipid Panel    Component Value Date/Time   CHOL 123 11/30/2021 0948   TRIG 142.0 11/30/2021 0948   HDL 26.40 (L) 11/30/2021 0948   CHOLHDL 5 11/30/2021 0948   VLDL 28.4 11/30/2021 0948   LDLCALC 68 11/30/2021 0948    Physical Exam:    VS:  BP 138/62   Pulse 72   Ht 6' (1.829 m)   Wt 202 lb (91.6 kg)   SpO2 98%   BMI 27.40 kg/m     Wt Readings from Last 3 Encounters:  08/18/22 202 lb (91.6 kg)  08/17/22 202 lb 6.4 oz (91.8 kg)  07/26/22 202 lb (91.6 kg)    General: Well developed, well nourished, NAD Lungs:Clear to ausculation bilaterally. No wheezes, rales, or rhonchi. Breathing is unlabored. Cardiovascular: RRR with S1 S2. No murmurs Extremities: No edema. Neuro: Alert and oriented. No focal deficits. No facial asymmetry. MAE spontaneously. Psych: Responds to questions appropriately with normal affect.    ASSESSMENT/PLAN:    PAF: s/p LAAO placement with Watchman FLX 31mm with Dr. Lalla Brothers  07/09/21. TEE 08/20/21 showed a small device leak with repeat study showing no further migration 1 cm mitral shoulder and persistent lateral lead 0.324 cm with average compression 17%. Plan to stop Plavix given duration since LAAO closure. No longer requires dental SBE.    Palpitations PVCs: Noted on EKG with ZIO showing >5% burden. Increase Torpol to 75mg  QD. Has follow up with EP.     Medication Adjustments/Labs and Tests Ordered: Current medicines are reviewed at length with the patient today.  Concerns regarding  medicines are outlined above.  Orders Placed This Encounter  Procedures   Magnesium   LONG TERM MONITOR (3-14 DAYS)   EKG 12-Lead   Meds ordered this encounter  Medications   DISCONTD: metoprolol succinate (TOPROL XL) 50 MG 24 hr tablet    Sig: Take 1.5 tablets (75 mg total) by mouth daily. Take with or immediately following a meal.    Dispense:  90 tablet    Refill:  3    Patient Instructions  Medication Instructions:  Your physician has recommended you make the following change in your medication:  STOP PLAVIX INCREASE TOPROL TO 75 MG DAILY    *If you need a refill on your cardiac medications before your next appointment, please call your pharmacy*   Lab Work: TODAY: MAGNESIUM  If you have labs (blood work) drawn today and your tests are completely normal, you will receive your results only by: MyChart Message (if you have MyChart) OR A paper copy in the mail If you have any lab test that is abnormal or we need to change your treatment, we will call you to review the results.   Testing/Procedures: Christena Deem- Long Term Monitor Instructions  Your physician has requested you wear a ZIO patch monitor for 7 days.  This is a single patch monitor. [employer] supplies one patch monitor per enrollment. Additional stickers are not available. Please do not apply patch if you will be having a Nuclear Stress Test,  Echocardiogram, Cardiac CT, MRI, or Chest Xray during the period you would be wearing the  monitor. The patch cannot be worn during these tests. You cannot remove and re-apply the  ZIO XT patch monitor.  Your ZIO patch monitor will be mailed 3 day USPS to your address on file. It may take 3-5 days  to receive your monitor after you have been enrolled.  Once you have received your monitor, please review the enclosed instructions. Your monitor  has already been registered assigning a specific monitor serial # to you.  Billing and Patient Assistance Program Information  We have  supplied [employer] with any of your insurance information on file for billing purposes. [employer] offers a sliding scale Patient Assistance Program for patients that do not have  insurance, or whose insurance does not completely cover the cost of the ZIO monitor.  You must apply for the Patient Assistance Program to qualify for this discounted rate.  To apply, please call [employer] at [phone], select option 4, select option 2, ask to apply for  Patient Assistance Program. Meredeth Ide will ask your household income, and how many people  are in your household. They will quote your out-of-pocket cost based on that information.  [employer] will also be able to set up a 17-month, interest-free payment plan if needed.  Applying the monitor   Shave hair from upper left chest.  Hold abrader disc by orange tab. Rub abrader in 40 strokes over the upper left chest as  indicated in your monitor instructions.  Clean area with 4 enclosed alcohol pads. Let dry.  Apply patch as indicated in monitor instructions. Patch will be placed under collarbone on left  side of chest with arrow pointing upward.  Rub patch adhesive wings for 2 minutes. Remove white label marked "1". Remove the white  label marked "2". Rub patch adhesive wings for 2 additional minutes.  While looking in a mirror, press and release button in center of patch. A small green light will  flash 3-4 times. This will be your only indicator that the monitor has been turned on.  Do not shower for the first 24 hours. You may shower after the first 24 hours.  Press the button if you feel a symptom. You will hear a small click. Record Date, Time and  Symptom in the Patient Logbook.  When you are ready to remove the patch, follow instructions on the last 2 pages of Patient  Logbook. Stick patch monitor onto the last page of Patient Logbook.  Place Patient Logbook in the blue and white box. Use locking tab on box and tape box closed  securely. The blue and  white box has prepaid postage on it. Please place it in the mailbox as  soon as possible. Your physician should have your test results approximately 7 days after the  monitor has been mailed back to [facility].  Call [facility] Customer Care at [phone] if you have questions regarding  your ZIO XT patch monitor. Call them immediately if you see an orange light blinking on your  monitor.  If your monitor falls off in less than 4 days, contact our Monitor department at [phone].  If your monitor becomes loose or falls off after 4 days call [employer] at [phone] for  suggestions on securing your monitor    Follow-Up: At [facility], you and your health needs are our priority.  As part of our continuing mission to provide you with exceptional heart care, we have created designated Provider Care Teams.  These Care Teams include your primary Cardiologist (physician) and Advanced Practice Providers (APPs -  Physician Assistants and Nurse Practitioners) who all work together to provide you with the care you need, when you need it.  We recommend signing up for the patient portal called "MyChart".  Sign up information is provided on this After Visit Summary.  MyChart is used to connect with patients for Virtual Visits (Telemedicine).  Patients are able to view lab/test results, encounter notes, upcoming appointments, etc.  Non-urgent messages can be sent to your provider as well.   To learn more about what you can do with MyChart, go to [email or web address].    Your next appointment:   NEXT AVAILABLE   Provider:   You may see Lanier Prude, MD or one of the following Advanced Practice Providers on your designated Care Team:   Francis Dowse, [place] [address] [place], [place] Sherie Don, NP      Signed, Georgie Chard, NP  09/03/2022 11:26 AM    [facility]

## 2022-07-28 DIAGNOSIS — R002 Palpitations: Secondary | ICD-10-CM

## 2022-07-28 DIAGNOSIS — I498 Other specified cardiac arrhythmias: Secondary | ICD-10-CM

## 2022-08-11 DIAGNOSIS — R002 Palpitations: Secondary | ICD-10-CM | POA: Diagnosis not present

## 2022-08-11 DIAGNOSIS — I498 Other specified cardiac arrhythmias: Secondary | ICD-10-CM | POA: Diagnosis not present

## 2022-08-16 NOTE — Progress Notes (Signed)
[facility] [phone]   [phone]  INITIAL CONSULT NOTE  Patient Care Team: Joaquim Namuncan, Graham S, MD as PCP - General (Family Medicine) Lanier PrudeLambert, Cameron T, MD as PCP - Electrophysiology (Cardiology) Jake BatheSkains, Mark C, MD as PCP - Cardiology (Cardiology) Phil DoppAdams, Michelle, [facility] as Pharmacist (Pharmacist)  CHIEF COMPLAINTS/PURPOSE OF CONSULTATION:  Normocytic anemia  HISTORY OF PRESENTING ILLNESS:  Paul Fry 77 y.o. male with medical history significant for atrial fibrillation, GERD, hyperlipidemia and hypertension who presents to the hematology clinic for evaluation of normocytic anemia. He is unaccompanied for this visit.   On review of the previous records, there is chronic anemia as far back as 2010. More recently, he developed persistent anemia starting June 2022. The most recent labs from 07/13/2022 showed Hgb 10.7, MCV 79.3. He was taking iron pills for the last two years but discontinued in January 2024 due to constipation.   On exam today, Paul Fry reports having chronic fatigue which requires him to rest but he continues to complete his ADLs on his own. He has a good appetite and denies any dietary restrictions. He denies nausea, vomiting or abdominal pain. His bowel habits are regular since discontinuing iron pills. He denies recurrent episodes of diarrhea or constipation. He denies easy bruising or signs of bleeding. He reports shortness of breath with exertion but none at rest. He denies fevers, chills, sweats, chest pain or cough. He has no other complaints. Rest of the 10 point ROS is below.   MEDICAL HISTORY:  Past Medical History:  Diagnosis Date   Allergy    SEASONAL   Anemia    Arthritis    KNEES   BPH (benign prostatic hyperplasia)    Difficult airway for intubation    Difficult airway for intubation 08/25/2021   Diverticulosis of colon    Elevated PSA    1.22 on 01-20-2016   Feeling of incomplete bladder emptying    GERD  (gastroesophageal reflux disease)    Hiatal hernia    History of adenomatous polyp of colon    2014 tubular adenoma   History of chronic gastritis    History of Helicobacter pylori infection    07/ 2016   History of lower GI bleeding    07/ 2010 and 12/ 2011  diverticular bleed both times   Hyperlipidemia    Hypertension    Lower urinary tract symptoms (LUTS)    Nonischemic cardiomyopathy    Plantar fascia syndrome    Presence of Watchman left atrial appendage closure device 07/09/2021   Watchman FLX 31mm with Dr. Lalla BrothersLambert   Strains to urinate    INTERMITTANT   Type 2 diabetes, diet controlled    ON BORDER LINE,NEVER TOOK MEDICATION.   Wears glasses     SURGICAL HISTORY: Past Surgical History:  Procedure Laterality Date   APPENDECTOMY  teenager   ATRIAL FIBRILLATION ABLATION N/A 05/15/2021   Procedure: ATRIAL FIBRILLATION ABLATION;  Surgeon: Lanier PrudeLambert, Cameron T, MD;  Location: [facility];  Service: Cardiovascular;  Laterality: N/A;   BIOPSY  11/19/2021   Procedure: BIOPSY;  Surgeon: Meryl DareStark, Malcolm T, MD;  Location: [facility] ENDOSCOPY;  Service: Gastroenterology;;   CARDIAC CATHETERIZATION  09-10-2008   dr dalton mclean   mild non-obstructive CAD;  30-40% mRCA, 20%pLAD,  ef 55%   CARDIOVASCULAR STRESS TEST  07/29/2011   Low risk nuclear study w/ no ischemia or infarct/  LVEF 47% and  apical hypokinesis (08-07-2012 cardiac MRI -- EF 49% w/ no hyperenhancement distal septal  and apical hypokinesis, mild LAE, mild RVE, mild AR with mild dilated sinus 70mm)   CATARACT EXTRACTION W/ INTRAOCULAR LENS  IMPLANT, BILATERAL  2016   COLONOSCOPY  last one 12-21-2012   COLONOSCOPY WITH PROPOFOL N/A 11/19/2021   Procedure: COLONOSCOPY WITH PROPOFOL;  Surgeon: Meryl Dare, MD;  Location: [facility];  Service: Gastroenterology;  Laterality: N/A;   CYSTOSCOPY WITH INSERTION OF UROLIFT N/A 06/17/2016   Procedure: CYSTOSCOPY WITH INSERTION OF UROLIFT;  Surgeon: Jethro Bolus, MD;  Location:  [facility];  Service: Urology;  Laterality: N/A;   ESOPHAGOGASTRODUODENOSCOPY  last one 11-22-2014   LEFT ATRIAL APPENDAGE OCCLUSION N/A 07/09/2021   Procedure: LEFT ATRIAL APPENDAGE OCCLUSION;  Surgeon: Lanier Prude, MD;  Location: [facility];  Service: Cardiovascular;  Laterality: N/A;   POLYPECTOMY  11/19/2021   Procedure: POLYPECTOMY;  Surgeon: Meryl Dare, MD;  Location: [facility];  Service: Gastroenterology;;   TEE WITHOUT CARDIOVERSION N/A 07/09/2021   Procedure: TRANSESOPHAGEAL ECHOCARDIOGRAM (TEE);  Surgeon: Lanier Prude, MD;  Location: [facility] INVASIVE CV LAB;  Service: Cardiovascular;  Laterality: N/A;   TEE WITHOUT CARDIOVERSION N/A 08/20/2021   Procedure: TRANSESOPHAGEAL ECHOCARDIOGRAM (TEE);  Surgeon: Christell Constant, MD;  Location: [facility] ENDOSCOPY;  Service: Cardiovascular;  Laterality: N/A;   TEE WITHOUT CARDIOVERSION N/A 01/08/2022   Procedure: TRANSESOPHAGEAL ECHOCARDIOGRAM (TEE);  Surgeon: Wendall Stade, MD;  Location: [facility] ENDOSCOPY;  Service: Cardiovascular;  Laterality: N/A;   TRANSTHORACIC ECHOCARDIOGRAM  09/17/2014   ef 50-55%/  trivial AR, MR and PR/  mild LAE and RAE/  mild TR    SOCIAL HISTORY: Social History   Socioeconomic History   Marital status: Married    Spouse name: Not on file   Number of children: 0   Years of education: Not on file   Highest education level: Not on file  Occupational History   Occupation: truck driver- retired    Associate Professor: RETIRED  Tobacco Use   Smoking status: Never   Smokeless tobacco: Never  Vaping Use   Vaping Use: Never used  Substance and Sexual Activity   Alcohol use: Not Currently    Comment: rarely drinks beer   Drug use: No   Sexual activity: Not Currently  Other Topics Concern   Not on file  Social History Narrative   Married 2003, no children.    Retired Naval architect, gets regular exercise- walking.    Step daughter is MD in [place]   Social Determinants of Health    Financial Resource Strain: Low Risk  (11/04/2021)   Overall Financial Resource Strain (CARDIA)    Difficulty of Paying Living Expenses: Not hard at all  Food Insecurity: No Food Insecurity (08/17/2022)   Hunger Vital Sign    Worried About Running Out of Food in the Last Year: Never true    Ran Out of Food in the Last Year: Never true  Transportation Needs: No Transportation Needs (08/17/2022)   PRAPARE - Administrator, Civil Service (Medical): No    Lack of Transportation (Non-Medical): No  Physical Activity: Insufficiently Active (11/04/2021)   Exercise Vital Sign    Days of Exercise per Week: 4 days    Minutes of Exercise per Session: 20 min  Stress: No Stress Concern Present (11/04/2021)   [employer] of Occupational Health - Occupational Stress Questionnaire    Feeling of Stress : Not at all  Social Connections: Socially Integrated (11/04/2021)   Social Connection and Isolation Panel [NHANES]  Frequency of Communication with Friends and Family: Three times a week    Frequency of Social Gatherings with Friends and Family: Once a week    Attends Religious Services: More than 4 times per year    Active Member of [employer] or Organizations: Yes    Attends Banker Meetings: 1 to 4 times per year    Marital Status: Married  Catering manager Violence: Not At Risk (08/17/2022)   Humiliation, Afraid, Rape, and Kick questionnaire    Fear of Current or Ex-Partner: No    Emotionally Abused: No    Physically Abused: No    Sexually Abused: No    FAMILY HISTORY: Family History  Problem Relation Age of Onset   Colon cancer Mother 56   Heart disease Sister        MI, pace maker   Kidney disease Sister        renal failure, nephrectomy- not cancer   Hypertension Sister    Hypertension Sister    Colon polyps Brother    Lung cancer Brother    Diabetes Neg Hx        DM   Depression Neg Hx    Stroke Neg Hx    Alcohol abuse Neg Hx        also no drug abuse     Prostate cancer Neg Hx    Liver cancer Neg Hx    Rectal cancer Neg Hx    Stomach cancer Neg Hx     ALLERGIES:  is allergic to aspirin.  MEDICATIONS:  Current Outpatient Medications  Medication Sig Dispense Refill   ascorbic acid (VITAMIN C) 1000 MG tablet Take 1,000 mg by mouth in the morning.     atorvastatin (LIPITOR) 20 MG tablet TAKE 1 TABLET (20 MG TOTAL) BY MOUTH EVERY MORNING. 90 tablet 3   clopidogrel (PLAVIX) 75 MG tablet Take 1 tablet (75 mg total) by mouth daily. 90 tablet 2   loperamide (IMODIUM A-D) 2 MG tablet Take 1 tablet (2 mg total) by mouth daily as needed for diarrhea or loose stools.     losartan (COZAAR) 25 MG tablet TAKE 1/2 TABLET EVERY MORNING 45 tablet 3   metoprolol succinate (TOPROL XL) 50 MG 24 hr tablet Take 1.5 tablets (75 mg total) by mouth daily. Take with or immediately following a meal. (Patient taking differently: Take 75 mg by mouth daily. Take with or immediately following a meal. Currently taking one tablet PO daily due to tingling in rt arm) 90 tablet 3   Multiple Minerals-Vitamins (CALCIUM 600+D3 PLUS MINERALS) TABS Take 1 tablet by mouth in the morning.     Multiple Vitamin (MULTIVITAMIN WITH MINERALS) TABS tablet Take 1 tablet by mouth daily. Centrum     Omega-3 Fatty Acids (FISH OIL PO) Take 1 capsule by mouth in the morning.     tamsulosin (FLOMAX) 0.4 MG CAPS capsule TAKE 2 CAPSULES (0.8 MG TOTAL) BY MOUTH DAILY. 180 capsule 3   No current facility-administered medications for this visit.    REVIEW OF SYSTEMS:   Constitutional: ( - ) fevers, ( - )  chills , ( - ) night sweats Eyes: ( - ) blurriness of vision, ( - ) double vision, ( - ) watery eyes Ears, nose, mouth, throat, and face: ( - ) mucositis, ( - ) sore throat Respiratory: ( - ) cough, ( +) dyspnea, ( - ) wheezes Cardiovascular: ( - ) palpitation, ( - ) chest discomfort, ( - ) lower extremity swelling  Gastrointestinal:  ( - ) nausea, ( - ) heartburn, ( - ) change in bowel  habits Skin: ( - ) abnormal skin rashes Lymphatics: ( - ) new lymphadenopathy, ( - ) easy bruising Neurological: ( - ) numbness, ( - ) tingling, ( - ) new weaknesses Behavioral/Psych: ( - ) mood change, ( - ) new changes  All other systems were reviewed with the patient and are negative.  PHYSICAL EXAMINATION: ECOG PERFORMANCE STATUS: 1 - Symptomatic but completely ambulatory  Vitals:   08/17/22 0915  BP: (!) 160/66  Pulse: 65  Resp: 14  Temp: 97.7 F (36.5 C)  SpO2: 98%   Filed Weights   08/17/22 0915  Weight: 202 lb 6.4 oz (91.8 kg)    GENERAL: well appearing male in NAD  SKIN: skin color, texture, turgor are normal, no rashes or significant lesions EYES: conjunctiva are pink and non-injected, sclera clear OROPHARYNX: no exudate, no erythema; lips, buccal mucosa, and tongue normal  LUNGS: clear to auscultation and percussion with normal breathing effort HEART: regular rate & rhythm and no murmurs and no lower extremity edema ABDOMEN: soft, non-tender, non-distended, normal bowel sounds Musculoskeletal: no cyanosis of digits and no clubbing  PSYCH: alert & oriented x 3, fluent speech NEURO: no focal motor/sensory deficits  LABORATORY DATA:  I have reviewed the data as listed    Latest Ref Rng & Units 08/17/2022   10:22 AM 07/13/2022    8:34 AM 06/07/2022    9:34 AM  CBC  WBC 4.0 - 10.5 K/uL 4.5  6.2  5.3   Hemoglobin 13.0 - 17.0 g/dL 16.1  09.6  04.5   Hematocrit 39.0 - 52.0 % 31.8  33.5  31.8   Platelets 150 - 400 K/uL 269  285.0  307.0        Latest Ref Rng & Units 08/17/2022   10:22 AM 06/07/2022    9:34 AM 01/01/2022   11:12 AM  CMP  Glucose 70 - 99 mg/dL 409  811  914   BUN 8 - 23 mg/dL 8  12  11    Creatinine 0.61 - 1.24 mg/dL 7.82  9.56  2.13   Sodium 135 - 145 mmol/L 137  136  139   Potassium 3.5 - 5.1 mmol/L 4.0  4.3  4.3   Chloride 98 - 111 mmol/L 103  100  101   CO2 22 - 32 mmol/L 28  28  25    Calcium 8.9 - 10.3 mg/dL 9.4  9.0  9.3   Total Protein 6.5  - 8.1 g/dL 8.0     Total Bilirubin 0.3 - 1.2 mg/dL 0.8     Alkaline Phos 38 - 126 U/L 65     AST 15 - 41 U/L 20     ALT 0 - 44 U/L 18        PATHRADIOGRAPHIC STUDIES: I have personally reviewed the radiological images as listed and agreed with the findings in the report. LONG TERM MONITOR (3-14 DAYS)  Result Date: 08/13/2022   Sinus Rhythm avg HR of 68 bpm   Very brief atrial flutter at 2am with variable conduction (<1% burden) - 5 min with avg rate 96 bpm   Freqent PAC's 5.5%, Rare PVC's   Post Watchman Patch Wear Time:  7 days and 1 hours (2024-03-20T18:13:34-0400 to 2024-03-27T20:00:39-0400) Patient had a min HR of 45 bpm, max HR of 179 bpm, and avg HR of 68 bpm. Predominant underlying rhythm was Sinus Rhythm. 1 run of Ventricular Tachycardia  occurred lasting 6 beats with a max rate of 135 bpm (avg 117 bpm). 6 Supraventricular Tachycardia runs occurred, the run with the fastest interval lasting 4 beats with a max rate of 179 bpm, the longest lasting 7 beats with an avg rate of 137 bpm. Atrial Fibrillation/Flutter occurred (<1% burden), ranging from 65-116 bpm (avg of 96 bpm), the longest lasting 5 mins 26 secs with an avg rate of 96 bpm. Atrial Flutter may be possible Atrial Tachycardia with variable block. Isolated SVEs were frequent (5.5%, [id number]), SVE Couplets were rare (<1.0%, 366), and SVE Triplets were rare (<1.0%, 58). Isolated VEs  were rare (<1.0%, 907), VE Couplets were rare (<1.0%, 22), and VE Triplets were rare (<1.0%, 1).    ASSESSMENT & PLAN Paul Fry is a 77 y.o. male who presents to the hematology clinic for evaluation of normocytic anemia.  I reviewed possible etiologies including nutritional deficiencies, hemolysis, chronic disease and bone marrow disorders. Patient will proceed with laboratory evaluation today. If underlying etiology is not identified with today's blood work, we will arrange for bone marrow biopsy to further evaluate. Patient expressed understanding and  would like to move forward with the workup.   #Normocytic anemia: --Labs today to check CBC, CMP, retic panel, erythropoietin levels --Check for nutritional deficiencies with iron, B12, MMA and folate levels --Check for hemolysis with LDH and haptoglobin --Check for paraproteinemia with SPEP/IFE and serum free light chains --Consider bone marrow biopsy if above workup is unremarkable.  --If underlying cause is not confirmed, we can consider Epo injections if Hgb <10.  --RTC once workup is complete  Orders Placed This Encounter  Procedures   CBC with Differential (Cancer Center Only)    Standing Status:   Future    Number of Occurrences:   1    Standing Expiration Date:   08/16/2023   CMP (Cancer Center only)    Standing Status:   Future    Number of Occurrences:   1    Standing Expiration Date:   08/16/2023   Ferritin    Standing Status:   Future    Number of Occurrences:   1    Standing Expiration Date:   08/16/2023   Iron and Iron Binding Capacity ([facility] only)    Standing Status:   Future    Number of Occurrences:   1    Standing Expiration Date:   08/16/2023   Retic Panel    Standing Status:   Future    Number of Occurrences:   1    Standing Expiration Date:   08/16/2023   Vitamin B12    Standing Status:   Future    Number of Occurrences:   1    Standing Expiration Date:   08/16/2023   Methylmalonic acid, serum    Standing Status:   Future    Number of Occurrences:   1    Standing Expiration Date:   08/16/2023   Folate, Serum    Standing Status:   Future    Number of Occurrences:   1    Standing Expiration Date:   08/16/2023   Haptoglobin    Standing Status:   Future    Number of Occurrences:   1    Standing Expiration Date:   08/16/2023   Erythropoietin    Standing Status:   Future    Number of Occurrences:   1    Standing Expiration Date:   08/16/2023   Lactate dehydrogenase (LDH)    Standing  Status:   Future    Number of Occurrences:   1    Standing Expiration Date:    08/16/2023   Kappa/lambda light chains    Standing Status:   Future    Number of Occurrences:   1    Standing Expiration Date:   08/16/2023   Multiple Myeloma Panel (SPEP&IFE w/QIG)    Standing Status:   Future    Number of Occurrences:   1    Standing Expiration Date:   08/16/2023    All questions were answered. The patient knows to call the clinic with any problems, questions or concerns.  I have spent a total of 60 minutes minutes of face-to-face and non-face-to-face time, preparing to see the patient, obtaining and/or reviewing separately obtained history, performing a medically appropriate examination, counseling and educating the patient, ordering tests/procedures, documenting clinical information in the electronic health record, and care coordination.   Georga Kaufmann, PA-C Department of Hematology/Oncology [facility] Phone: [phone]  Patient was seen with Dr. Leonides Schanz  I have read the above note and personally examined the patient. I agree with the assessment and plan as noted above.  Briefly Paul Fry is a 78 year old male who presents for evaluation of normocytic anemia.  At this time the etiology of his normocytic anemia is unclear.  We will do a full workup to include nutritional panel, hemolysis panel, multiple myeloma panel, and kidney evaluation.  In the event no clear etiology can be found will need to consider a bone marrow biopsy.  The patient voiced understanding of our findings and the plan moving forward.   Ulysees Barns, MD Department of Hematology/Oncology [facility] Phone: [phone] Pager: [phone] Email: [email or web address]

## 2022-08-17 ENCOUNTER — Inpatient Hospital Stay: Payer: Medicare Other

## 2022-08-17 ENCOUNTER — Other Ambulatory Visit: Payer: Self-pay

## 2022-08-17 ENCOUNTER — Encounter: Payer: Self-pay | Admitting: Physician Assistant

## 2022-08-17 ENCOUNTER — Inpatient Hospital Stay: Payer: Medicare Other | Attending: Physician Assistant | Admitting: Physician Assistant

## 2022-08-17 VITALS — BP 160/66 | HR 65 | Temp 97.7°F | Resp 14 | Wt 202.4 lb

## 2022-08-17 DIAGNOSIS — Z7902 Long term (current) use of antithrombotics/antiplatelets: Secondary | ICD-10-CM | POA: Insufficient documentation

## 2022-08-17 DIAGNOSIS — I2510 Atherosclerotic heart disease of native coronary artery without angina pectoris: Secondary | ICD-10-CM | POA: Diagnosis not present

## 2022-08-17 DIAGNOSIS — K219 Gastro-esophageal reflux disease without esophagitis: Secondary | ICD-10-CM | POA: Diagnosis not present

## 2022-08-17 DIAGNOSIS — E785 Hyperlipidemia, unspecified: Secondary | ICD-10-CM | POA: Insufficient documentation

## 2022-08-17 DIAGNOSIS — D649 Anemia, unspecified: Secondary | ICD-10-CM

## 2022-08-17 DIAGNOSIS — I4891 Unspecified atrial fibrillation: Secondary | ICD-10-CM | POA: Insufficient documentation

## 2022-08-17 DIAGNOSIS — Z79899 Other long term (current) drug therapy: Secondary | ICD-10-CM | POA: Insufficient documentation

## 2022-08-17 DIAGNOSIS — I10 Essential (primary) hypertension: Secondary | ICD-10-CM | POA: Insufficient documentation

## 2022-08-17 DIAGNOSIS — E119 Type 2 diabetes mellitus without complications: Secondary | ICD-10-CM | POA: Insufficient documentation

## 2022-08-17 LAB — RETIC PANEL
Immature Retic Fract: 35.8 % — ABNORMAL HIGH (ref 2.3–15.9)
RBC.: 3.81 MIL/uL — ABNORMAL LOW (ref 4.22–5.81)
Retic Count, Absolute: 70.1 10*3/uL (ref 19.0–186.0)
Retic Ct Pct: 1.8 % (ref 0.4–3.1)
Reticulocyte Hemoglobin: 29.4 pg (ref >27.9)

## 2022-08-17 LAB — CBC WITH DIFFERENTIAL (CANCER CENTER ONLY)
Abs Immature Granulocytes: 0.04 10*3/uL (ref 0.00–0.07)
Basophils Absolute: 0 10*3/uL (ref 0.0–0.1)
Basophils Relative: 1 %
Eosinophils Absolute: 0 10*3/uL (ref 0.0–0.5)
Eosinophils Relative: 0 %
HCT: 31.8 % — ABNORMAL LOW (ref 39.0–52.0)
Hemoglobin: 10 g/dL — ABNORMAL LOW (ref 13.0–17.0)
Immature Granulocytes: 1 %
Lymphocytes Relative: 26 %
Lymphs Abs: 1.2 10*3/uL (ref 0.7–4.0)
MCH: 25.4 pg — ABNORMAL LOW (ref 26.0–34.0)
MCHC: 31.4 g/dL (ref 30.0–36.0)
MCV: 80.9 fL (ref 80.0–100.0)
Monocytes Absolute: 0.2 10*3/uL (ref 0.1–1.0)
Monocytes Relative: 5 %
Neutro Abs: 3 10*3/uL (ref 1.7–7.7)
Neutrophils Relative %: 67 %
Platelet Count: 269 10*3/uL (ref 150–400)
RBC: 3.93 MIL/uL — ABNORMAL LOW (ref 4.22–5.81)
RDW: 22.7 % — ABNORMAL HIGH (ref 11.5–15.5)
WBC Count: 4.5 10*3/uL (ref 4.0–10.5)
nRBC: 1.1 % — ABNORMAL HIGH (ref 0.0–0.2)

## 2022-08-17 LAB — CMP (CANCER CENTER ONLY)
ALT: 18 U/L (ref 0–44)
AST: 20 U/L (ref 15–41)
Albumin: 4.2 g/dL (ref 3.5–5.0)
Alkaline Phosphatase: 65 U/L (ref 38–126)
Anion gap: 6 (ref 5–15)
BUN: 8 mg/dL (ref 8–23)
CO2: 28 mmol/L (ref 22–32)
Calcium: 9.4 mg/dL (ref 8.9–10.3)
Chloride: 103 mmol/L (ref 98–111)
Creatinine: 0.83 mg/dL (ref 0.61–1.24)
GFR, Estimated: >60 mL/min (ref >60)
Glucose, Bld: 119 mg/dL — ABNORMAL HIGH (ref 70–99)
Potassium: 4 mmol/L (ref 3.5–5.1)
Sodium: 137 mmol/L (ref 135–145)
Total Bilirubin: 0.8 mg/dL (ref 0.3–1.2)
Total Protein: 8 g/dL (ref 6.5–8.1)

## 2022-08-17 LAB — IRON AND IRON BINDING CAPACITY (CC-WL,HP ONLY)
Iron: 85 ug/dL (ref 45–182)
Saturation Ratios: 28 % (ref 17.9–39.5)
TIBC: 305 ug/dL (ref 250–450)
UIBC: 220 ug/dL (ref 117–376)

## 2022-08-17 LAB — FERRITIN: Ferritin: 118 ng/mL (ref 24–336)

## 2022-08-17 LAB — VITAMIN B12: Vitamin B-12: 251 pg/mL (ref 180–914)

## 2022-08-17 LAB — LACTATE DEHYDROGENASE: LDH: 558 U/L — ABNORMAL HIGH (ref 98–192)

## 2022-08-17 LAB — FOLATE: Folate: 20.7 ng/mL (ref >5.9)

## 2022-08-18 ENCOUNTER — Ambulatory Visit: Payer: Medicare Other | Attending: Cardiology | Admitting: Cardiology

## 2022-08-18 ENCOUNTER — Encounter: Payer: Self-pay | Admitting: Cardiology

## 2022-08-18 ENCOUNTER — Ambulatory Visit: Payer: Medicare Other | Admitting: Cardiology

## 2022-08-18 VITALS — BP 114/60 | HR 63 | Ht 72.0 in | Wt 202.0 lb

## 2022-08-18 DIAGNOSIS — R0789 Other chest pain: Secondary | ICD-10-CM

## 2022-08-18 DIAGNOSIS — I10 Essential (primary) hypertension: Secondary | ICD-10-CM | POA: Diagnosis not present

## 2022-08-18 DIAGNOSIS — I491 Atrial premature depolarization: Secondary | ICD-10-CM

## 2022-08-18 DIAGNOSIS — Z95818 Presence of other cardiac implants and grafts: Secondary | ICD-10-CM | POA: Diagnosis not present

## 2022-08-18 DIAGNOSIS — I480 Paroxysmal atrial fibrillation: Secondary | ICD-10-CM | POA: Diagnosis not present

## 2022-08-18 LAB — KAPPA/LAMBDA LIGHT CHAINS
Kappa free light chain: 45.4 mg/L — ABNORMAL HIGH (ref 3.3–19.4)
Kappa, lambda light chain ratio: 1.85 — ABNORMAL HIGH (ref 0.26–1.65)
Lambda free light chains: 24.5 mg/L (ref 5.7–26.3)

## 2022-08-18 LAB — ERYTHROPOIETIN: Erythropoietin: 131.8 m[IU]/mL — ABNORMAL HIGH (ref 2.6–18.5)

## 2022-08-18 MED ORDER — METOPROLOL SUCCINATE ER 50 MG PO TB24: ORAL_TABLET | ORAL | 0 refills | Status: DC

## 2022-08-18 MED ORDER — METOPROLOL TARTRATE 50 MG PO TABS: 50.0000 mg | ORAL_TABLET | Freq: Once | ORAL | 0 refills | Status: DC

## 2022-08-18 NOTE — Progress Notes (Signed)
Cardiology Office Note Date:  08/18/2022  Patient ID:  Paul Fry, Paul Fry [birth year], MRN [id number] PCP:  Joaquim Nam, MD  Cardiologist:  Donato Schultz, MD Electrophysiologist: Lanier Prude, MD  Chief Complaint: zio follow-up, PAC  History of Present Illness: Paul Fry is a 77 y.o. male with PMH notable for afib/flutter s/p ablation, GI bleed 2/2 anticoag s/p LAAO (watchman 07/2021), CAD, T2DM, HFpEF; seen today for Lanier Prude, MD for routine electrophysiology followup.  He is s/p LAAO by Dr. Lalla Brothers 05/2021. Initial follow-up TEE showed small device leak. Follow-up TEE continued to show small leak, so plavix continued and Ok to stop ASA (given GI bleed hx with ASA).   Patient saw NP Julien Girt 07/2022, EKG at that visit showed bigeminal PACs, so zio ordered that showed >5% PAC burden. His toprol was increased to 75mg  daily. He presents today for follow-up.  He presents today feeling overall very well. He denies palpitations, SOB with activity on flat ground. He does continue to have "chest pressure", SOB with pushing trash can up steep driveway. He feels well walking on flat ground. No syncope or presyncope, no diaphoresis. He did not tolerate 75mg  toprol daily, had worsening L shoulder/neck tingling. Tingling was not overtly concerning, just annoying to him.  He does have GERD and previously had some chest pressure with GERD, but avoidance of spicy food has greatly reduced GERD symptoms.  Checks BP at home, readings in 130-140s/70s  No bleeding concerns on plavix.  Past Medical History:  Diagnosis Date   Allergy    SEASONAL   Anemia    Arthritis    KNEES   BPH (benign prostatic hyperplasia)    Difficult airway for intubation    Difficult airway for intubation 08/25/2021   Diverticulosis of colon    Elevated PSA    1.22 on 01-20-2016   Feeling of incomplete bladder emptying    GERD (gastroesophageal reflux disease)    Hiatal hernia    History of  adenomatous polyp of colon    2014 tubular adenoma   History of chronic gastritis    History of Helicobacter pylori infection    07/ 2016   History of lower GI bleeding    07/ 2010 and 12/ 2011  diverticular bleed both times   Hyperlipidemia    Hypertension    Lower urinary tract symptoms (LUTS)    Nonischemic cardiomyopathy    Plantar fascia syndrome    Presence of Watchman left atrial appendage closure device 07/09/2021   Watchman FLX 28mm with Dr. Lalla Brothers   Strains to urinate    INTERMITTANT   Type 2 diabetes, diet controlled    ON BORDER LINE,NEVER TOOK MEDICATION.   Wears glasses     Past Surgical History:  Procedure Laterality Date   APPENDECTOMY  teenager   ATRIAL FIBRILLATION ABLATION N/A 05/15/2021   Procedure: ATRIAL FIBRILLATION ABLATION;  Surgeon: Lanier Prude, MD;  Location: [facility];  Service: Cardiovascular;  Laterality: N/A;   BIOPSY  11/19/2021   Procedure: BIOPSY;  Surgeon: Meryl Dare, MD;  Location: [facility];  Service: Gastroenterology;;   CARDIAC CATHETERIZATION  09-10-2008   dr dalton mclean   mild non-obstructive CAD;  30-40% mRCA, 20%pLAD,  ef 55%   CARDIOVASCULAR STRESS TEST  07/29/2011   Low risk nuclear study w/ no ischemia or infarct/  LVEF 47% and  apical hypokinesis (08-07-2012 cardiac MRI -- EF 49% w/ no hyperenhancement distal septal and apical hypokinesis, mild LAE,  mild RVE, mild AR with mild dilated sinus 55mm)   CATARACT EXTRACTION W/ INTRAOCULAR LENS  IMPLANT, BILATERAL  2016   COLONOSCOPY  last one 12-21-2012   COLONOSCOPY WITH PROPOFOL N/A 11/19/2021   Procedure: COLONOSCOPY WITH PROPOFOL;  Surgeon: Meryl Dare, MD;  Location: [facility];  Service: Gastroenterology;  Laterality: N/A;   CYSTOSCOPY WITH INSERTION OF UROLIFT N/A 06/17/2016   Procedure: CYSTOSCOPY WITH INSERTION OF UROLIFT;  Surgeon: Jethro Bolus, MD;  Location: [facility];  Service: Urology;  Laterality: N/A;    ESOPHAGOGASTRODUODENOSCOPY  last one 11-22-2014   LEFT ATRIAL APPENDAGE OCCLUSION N/A 07/09/2021   Procedure: LEFT ATRIAL APPENDAGE OCCLUSION;  Surgeon: Lanier Prude, MD;  Location: [facility];  Service: Cardiovascular;  Laterality: N/A;   POLYPECTOMY  11/19/2021   Procedure: POLYPECTOMY;  Surgeon: Meryl Dare, MD;  Location: [facility];  Service: Gastroenterology;;   TEE WITHOUT CARDIOVERSION N/A 07/09/2021   Procedure: TRANSESOPHAGEAL ECHOCARDIOGRAM (TEE);  Surgeon: Lanier Prude, MD;  Location: [facility] INVASIVE CV LAB;  Service: Cardiovascular;  Laterality: N/A;   TEE WITHOUT CARDIOVERSION N/A 08/20/2021   Procedure: TRANSESOPHAGEAL ECHOCARDIOGRAM (TEE);  Surgeon: Christell Constant, MD;  Location: [facility] ENDOSCOPY;  Service: Cardiovascular;  Laterality: N/A;   TEE WITHOUT CARDIOVERSION N/A 01/08/2022   Procedure: TRANSESOPHAGEAL ECHOCARDIOGRAM (TEE);  Surgeon: Wendall Stade, MD;  Location: [facility] ENDOSCOPY;  Service: Cardiovascular;  Laterality: N/A;   TRANSTHORACIC ECHOCARDIOGRAM  09/17/2014   ef 50-55%/  trivial AR, MR and PR/  mild LAE and RAE/  mild TR    Current Outpatient Medications  Medication Instructions   ascorbic acid (VITAMIN C) 1,000 mg, Oral, Every morning   atorvastatin (LIPITOR) 20 mg, Oral, Every morning   clopidogrel (PLAVIX) 75 mg, Oral, Daily   loperamide (IMODIUM A-D) 2 mg, Oral, Daily PRN   losartan (COZAAR) 25 MG tablet TAKE 1/2 TABLET EVERY MORNING   metoprolol succinate (TOPROL-XL) 50 MG 24 hr tablet Take 1 tablet (50 mg) by mouth in the morning. Take 1/2 tablet (25 mg) in the evening. Take with or immediately following a meal.   metoprolol tartrate (LOPRESSOR) 50 mg, Oral,  Once, 2 hours prior to cardiac test as directed   Multiple Minerals-Vitamins (CALCIUM 600+D3 PLUS MINERALS) TABS 1 tablet, Oral, Every morning   Multiple Vitamin (MULTIVITAMIN WITH MINERALS) TABS tablet 1 tablet, Oral, Daily, Centrum    Omega-3 Fatty Acids (FISH OIL PO) 1  capsule, Oral, Every morning   tamsulosin (FLOMAX) 0.8 mg, Oral, Daily    Social History:  The patient  reports that he has never smoked. He has never used smokeless tobacco. He reports that he does not currently use alcohol. He reports that he does not use drugs.   Family History:  The patient's family history includes Colon cancer (age of onset: 67) in his mother; Colon polyps in his brother; Heart disease in his sister; Hypertension in his sister and sister; Kidney disease in his sister; Lung cancer in his brother.  ROS:  Please see the history of present illness. All other systems are reviewed and otherwise negative.   PHYSICAL EXAM:  VS:  BP 114/60 (BP Location: Left Arm, Patient Position: Sitting, Cuff Size: Normal)   Pulse 63   Ht 6' (1.829 m)   Wt 202 lb (91.6 kg)   SpO2 98%   BMI 27.40 kg/m  BMI: Body mass index is 27.4 kg/m.  GEN- The patient is well appearing, alert and oriented x 3 today.   Lungs-  Clear to ausculation bilaterally, normal work of breathing.  Heart- Regular rate and rhythm, no murmurs, rubs or gallops Extremities- No peripheral edema, warm, dry  EKG is ordered. Personal review of EKG from today shows:  NSR, rate 63bpm. No PACs  Recent Labs: 07/26/2022: Magnesium 2.2 08/17/2022: ALT 18; BUN 8; Creatinine 0.83; Hemoglobin 10.0; Platelet Count 269; Potassium 4.0; Sodium 137  11/30/2021: Cholesterol 123; HDL 26.40; LDL Cholesterol 68; Total CHOL/HDL Ratio 5; Triglycerides 142.0; VLDL 28.4   Estimated Creatinine Clearance: 83.1 mL/min (by C-G formula based on SCr of 0.83 mg/dL).   Wt Readings from Last 3 Encounters:  08/18/22 202 lb (91.6 kg)  08/17/22 202 lb 6.4 oz (91.8 kg)  07/26/22 202 lb (91.6 kg)     Additional studies reviewed include: Previous EP, cardiology notes.   Long term monitor, 07/26/2022   Sinus Rhythm avg HR of 68 bpm   Very brief atrial flutter at 2am with variable conduction (<1% burden) - 5 min with avg rate 96 bpm   Freqent PAC's  5.5%, Rare PVC's   Post Watchman  TEE, 01/08/2022  1. Current study no further migration 1 cm mitral shoulder and persistent lateral lead 0.324 cm with average compression 17%.   2. Prior TEE 08/20/21 showed migration of device and compression only 8% Small 0.185 cm lateral leak.   3. Left ventricular ejection fraction, by estimation, is 55 to 60%. The left ventricle has normal function.   4. Right ventricular systolic function is normal. The right ventricular size is normal.   5. Left atrial size was moderately dilated. No left atrial/left atrial appendage thrombus was detected.   6. Right atrial size was moderately dilated.   7. The mitral valve is abnormal. Mild mitral valve regurgitation.   8. The aortic valve is tricuspid. There is mild calcification of the aortic valve. Aortic valve regurgitation is mild. Aortic valve sclerosis is present, with no evidence of aortic valve stenosis.   Myoview spect, 11/05/2021   The study is normal. The study is low risk.   No ST deviation was noted.   LV perfusion is normal. There is no evidence of ischemia. There is no evidence of infarction.   Left ventricular function is normal. Nuclear stress EF: 54 %. The left ventricular ejection fraction is mildly decreased (45-54%). End diastolic cavity size is normal. End systolic cavity size is normal.   Prior study available for comparison from 07/30/2011.   Normal resting and stress perfusion. No ischemia or infarction EF 54%  TEE, 08/20/2021  1. Follow up from Watchman 31 mm FLX device. There is proximal migration of the device within the left atrial appendage. There is a small, 1.85 mm, peri-device leak. There is an increased mitral shoulder of the device from prior.   2. Right ventricular systolic function is hyperdynamic. The right ventricular size is normal.   3. Left ventricular ejection fraction, by estimation, is 55 to 60%. The left ventricle has normal function.   4. The mitral valve is abnormal.  Trivial mitral valve regurgitation. No evidence of mitral stenosis.   5. The tricuspid valve is abnormal.   6. The aortic valve is tricuspid. Aortic valve regurgitation is mild. No aortic stenosis is present.   LAAO, 07/09/2021 1.Successful implantation of a WATCHMAN left atrial appendage occlusive device    2. TEE demonstrating no LAA thrombus 3. No early apparent complications.   CT cadiac, 05/08/2021 1. The left atrial appendage is a large chicken wing morphology without thrombus. 2. A 31  mm watchman FLX device is recommended based on the above landing zone measurements (24.7 mm maximum diameter; 20% compression). 3. An inferior posterior IAS puncture site is recommended.  5. Optimal deployment angle: RAO 3 CRA 8  6. CAC score of 576, which is 83rd percentile for age-, sex-, and race-matched controls CT chest over-read IMPRESSION: 1. Aortic Atherosclerosis (ICD10-I70.0).   ASSESSMENT AND PLAN:  #) PACs Toprol recently increased to  daily, pt did not tolerate and self-lowered to  daily No PACs on today's EKG No palpitations or symptomatic burden, however zio showed 5% PAC burden Will re-attempt  daily, splitting dose  AM and  in PM for at least 1 week   #) s/p LAAO #) h/o GI bleed #) parox Afib/flutter s/p AF ablation Most recent TEE continues to show small leak around device Plan to continue plavix indefinitely given intolerance to ASA in past Tolerating plavix well No longer requires SBE for dental cleanings/procedures  CHA2DS2-VASc Score = 6 (CHF, HTN, DM, Vasc dz, age x 2) No OAC d/t LAAO device     }   #) Chest pressure on exertion Remains symptomatic, not worsening in severity or duration Recent myoview was reassuring Recommended coronary CTA to further eval given known CAD history   #) HTN Normotensive in office today   Current medicines are reviewed at length with the patient today.   The patient does not have concerns regarding his  medicines.  The following changes were made today:   Take  toprol in AM Take  toprol in PM  Labs/ tests ordered today include:  Orders Placed This Encounter  Procedures   CT CORONARY MORPH W/CTA COR W/SCORE W/CA W/CM &/OR WO/CM   EKG 12-Lead     Disposition: Follow up with EP APP in in 3 months   Signed, Sherie Don, NP  08/18/22  12:31 PM  Electrophysiology [facility]

## 2022-08-18 NOTE — Patient Instructions (Addendum)
Medication Instructions:  TAKE metoprolol tartrate 50 mg by mouth in addition to your current morning dose of Toprol 50 mg 2 hours prior to coronary CTA.  INCREASE metoprolol succinate to 1 tablet (50 mg) by mouth in the morning and 1/2 tablet (25 mg) by mouth every evening.  *If you need a refill on your cardiac medications before your next appointment, please call your pharmacy*  Lab Work: No labs ordered  If you have labs (blood work) drawn today and your tests are completely normal, you will receive your results only by: MyChart Message (if you have MyChart) OR A paper copy in the mail If you have any lab test that is abnormal or we need to change your treatment, we will call you to review the results.   Testing/Procedures:   Your cardiac CT has been scheduled on 09/09/22 at 10:15 AM at:  [facility] [address] [place], [place] [address] [phone]  If scheduled at [facility] or [facility], please arrive 15 mins early for check-in and test prep.  Please follow these instructions carefully (unless otherwise directed):  Hold all erectile dysfunction medications at least 3 days (72 hrs) prior to test. (Ie viagra, cialis, sildenafil, tadalafil, etc) We will administer nitroglycerin during this exam.   On the Day of the Test: Drink plenty of water until 1 hour prior to the test. Do not eat any food 1 hour prior to test. You may take your regular medications prior to the test.  Take metoprolol two hours prior to test.      After the Test: Drink plenty of water. After receiving IV contrast, you may experience a mild flushed feeling. This is normal. On occasion, you may experience a mild rash up to 24 hours after the test. This is not dangerous. If this occurs, you can take Benadryl 25 mg and increase your fluid intake. If you experience trouble breathing, this can be serious. If it  is severe call 911 IMMEDIATELY. If it is mild, please call our office. If you take any of these medications: Glipizide/Metformin, Avandament, Glucavance, please do not take 48 hours after completing test unless otherwise instructed.  We will call to schedule your test 2-4 weeks out understanding that some insurance companies will need an authorization prior to the service being performed.   For non-scheduling related questions, please contact the cardiac imaging nurse navigator should you have any questions/concerns: Rockwell Alexandria, Cardiac Imaging Nurse Navigator Larey Brick, Cardiac Imaging Nurse Navigator [facility] Direct Office Dial: [phone]   For scheduling needs, including cancellations and rescheduling, please call [place], [phone].   Follow-Up: At [facility], you and your health needs are our priority.  As part of our continuing mission to provide you with exceptional heart care, we have created designated Provider Care Teams.  These Care Teams include your primary Cardiologist (physician) and Advanced Practice Providers (APPs -  Physician Assistants and Nurse Practitioners) who all work together to provide you with the care you need, when you need it.  We recommend signing up for the patient portal called "MyChart".  Sign up information is provided on this After Visit Summary.  MyChart is used to connect with patients for Virtual Visits (Telemedicine).  Patients are able to view lab/test results, encounter notes, upcoming appointments, etc.  Non-urgent messages can be sent to your provider as well.   To learn more about what you can do with  MyChart, go to [email or web address].    Your next appointment:   3 month(s)  Provider:   Sherie Don, NP

## 2022-08-19 LAB — HAPTOGLOBIN: Haptoglobin: 171 mg/dL (ref 34–355)

## 2022-08-20 LAB — MULTIPLE MYELOMA PANEL, SERUM
Albumin SerPl Elph-Mcnc: 3.7 g/dL (ref 2.9–4.4)
Albumin/Glob SerPl: 1.1 (ref 0.7–1.7)
Alpha 1: 0.2 g/dL (ref 0.0–0.4)
Alpha2 Glob SerPl Elph-Mcnc: 0.7 g/dL (ref 0.4–1.0)
B-Globulin SerPl Elph-Mcnc: 1.1 g/dL (ref 0.7–1.3)
Gamma Glob SerPl Elph-Mcnc: 1.5 g/dL (ref 0.4–1.8)
Globulin, Total: 3.5 g/dL (ref 2.2–3.9)
IgA: 387 mg/dL (ref 61–437)
IgG (Immunoglobin G), Serum: 1584 mg/dL (ref 603–1613)
IgM (Immunoglobulin M), Srm: 55 mg/dL (ref 15–143)
Total Protein ELP: 7.2 g/dL (ref 6.0–8.5)

## 2022-08-27 LAB — METHYLMALONIC ACID, SERUM: Methylmalonic Acid, Quantitative: 125 nmol/L (ref 0–378)

## 2022-08-30 ENCOUNTER — Telehealth: Payer: Self-pay | Admitting: Physician Assistant

## 2022-08-30 DIAGNOSIS — D649 Anemia, unspecified: Secondary | ICD-10-CM

## 2022-08-30 NOTE — Telephone Encounter (Signed)
I called [name] to review the lab results from 08/17/2022. Findings show normocytic anemia that is trending down to 10.0g/dL. Remaining workup was negative including no evidence of kidney dysfunction, nutritional deficiency, hemolysis or paraproteinemia.   Dr. Leonides Schanz recommends a bone marrow biopsy for further evaluation

## 2022-09-08 ENCOUNTER — Telehealth (HOSPITAL_COMMUNITY): Payer: Self-pay | Admitting: Emergency Medicine

## 2022-09-08 NOTE — Telephone Encounter (Signed)
Reaching out to patient to offer assistance regarding upcoming cardiac imaging study; pt verbalizes understanding of appt date/time, parking situation and where to check in, pre-test NPO status and medications ordered, and verified current allergies; name and call back number provided for further questions should they arise Bach Rocchi RN Navigator Cardiac Imaging [facility] [phone] office [phone] cell 

## 2022-09-09 ENCOUNTER — Ambulatory Visit
Admission: RE | Admit: 2022-09-09 | Discharge: 2022-09-09 | Disposition: A | Discharge status: Home / Self Care | Payer: Medicare Other | Source: Ambulatory Visit | Attending: Cardiology | Admitting: Cardiology

## 2022-09-09 DIAGNOSIS — R072 Precordial pain: Secondary | ICD-10-CM

## 2022-09-09 DIAGNOSIS — I2510 Atherosclerotic heart disease of native coronary artery without angina pectoris: Secondary | ICD-10-CM | POA: Insufficient documentation

## 2022-09-09 DIAGNOSIS — R0789 Other chest pain: Secondary | ICD-10-CM | POA: Diagnosis not present

## 2022-09-09 DIAGNOSIS — I700 Atherosclerosis of aorta: Secondary | ICD-10-CM | POA: Insufficient documentation

## 2022-09-09 DIAGNOSIS — J479 Bronchiectasis, uncomplicated: Secondary | ICD-10-CM | POA: Diagnosis not present

## 2022-09-09 MED ORDER — IOHEXOL 350 MG/ML SOLN
75.0000 mL | Freq: Once | INTRAVENOUS | Status: AC | PRN
  Administered 2022-09-09: 75 mL via INTRAVENOUS

## 2022-09-09 MED ORDER — NITROGLYCERIN 0.4 MG SL SUBL
0.8000 mg | SUBLINGUAL_TABLET | Freq: Once | SUBLINGUAL | Status: AC
  Administered 2022-09-09: 0.8 mg via SUBLINGUAL

## 2022-09-09 NOTE — Progress Notes (Signed)
Patient tolerated CT well. Drank water after. Vital signs stable encourage to drink water throughout day.Reasons explained and verbalized understanding. Ambulated steady gait.  

## 2022-09-15 ENCOUNTER — Other Ambulatory Visit: Payer: Self-pay | Admitting: Radiology

## 2022-09-15 DIAGNOSIS — D649 Anemia, unspecified: Secondary | ICD-10-CM

## 2022-09-15 NOTE — Consult Note (Signed)
Chief Complaint: Patient was seen in consultation today for CT guided bone marrow biopsy   Referring Physician(s): Dorsey,J  Supervising Physician: Simonne Come  Patient Status: [facility] - Out-pt  History of Present Illness: Paul Fry is a 77 y.o. male with PMH sig for GERD, gastritis, hypertension, HLD,HTN, nonischemic cardiomyopathy, DM, Watchman left atrial appendage closure device who presents now with persistent normocytic anemia of uncertain etiology. He is scheduled today for CT guided bone marrow biopsy for further evaluation.   Past Medical History:  Diagnosis Date   Allergy    SEASONAL   Anemia    Arthritis    KNEES   BPH (benign prostatic hyperplasia)    Difficult airway for intubation    Difficult airway for intubation 08/25/2021   Diverticulosis of colon    Elevated PSA    1.22 on 01-20-2016   Feeling of incomplete bladder emptying    GERD (gastroesophageal reflux disease)    Hiatal hernia    History of adenomatous polyp of colon    2014 tubular adenoma   History of chronic gastritis    History of Helicobacter pylori infection    07/ 2016   History of lower GI bleeding    07/ 2010 and 12/ 2011  diverticular bleed both times   Hyperlipidemia    Hypertension    Lower urinary tract symptoms (LUTS)    Nonischemic cardiomyopathy (HCC)    Plantar fascia syndrome    Presence of Watchman left atrial appendage closure device 07/09/2021   Watchman FLX 31mm with Dr. Lalla Brothers   Strains to urinate    INTERMITTANT   Type 2 diabetes, diet controlled (HCC)    ON BORDER LINE,NEVER TOOK MEDICATION.   Wears glasses     Past Surgical History:  Procedure Laterality Date   APPENDECTOMY  teenager   ATRIAL FIBRILLATION ABLATION N/A 05/15/2021   Procedure: ATRIAL FIBRILLATION ABLATION;  Surgeon: Lanier Prude, MD;  Location: [facility];  Service: Cardiovascular;  Laterality: N/A;   BIOPSY  11/19/2021   Procedure: BIOPSY;  Surgeon: Meryl Dare, MD;   Location: [facility]OS[facility];  Service: Gastroenterology;;   CARDIAC CATHETERIZATION  09-10-2008   dr dalton mclean   mild non-obstructive CAD;  30-40% mRCA, 20%pLAD,  ef 55%   CARDIOVASCULAR STRESS TEST  07/29/2011   Low risk nuclear study w/ no ischemia or infarct/  LVEF 47% and  apical hypokinesis (08-07-2012 cardiac MRI -- EF 49% w/ no hyperenhancement distal septal and apical hypokinesis, mild LAE, mild RVE, mild AR with mild dilated sinus 39mm)   CATARACT EXTRACTION W/ INTRAOCULAR LENS  IMPLANT, BILATERAL  2016   COLONOSCOPY  last one 12-21-2012   COLONOSCOPY WITH PROPOFOL N/A 11/19/2021   Procedure: COLONOSCOPY WITH PROPOFOL;  Surgeon: Meryl Dare, MD;  Location: [facility];  Service: Gastroenterology;  Laterality: N/A;   CYSTOSCOPY WITH INSERTION OF UROLIFT N/A 06/17/2016   Procedure: CYSTOSCOPY WITH INSERTION OF UROLIFT;  Surgeon: Jethro Bolus, MD;  Location: [facility] [place];  Service: Urology;  Laterality: N/A;   ESOPHAGOGASTRODUODENOSCOPY  last one 11-22-2014   LEFT ATRIAL APPENDAGE OCCLUSION N/A 07/09/2021   Procedure: LEFT ATRIAL APPENDAGE OCCLUSION;  Surgeon: Lanier Prude, MD;  Location: [facility];  Service: Cardiovascular;  Laterality: N/A;   POLYPECTOMY  11/19/2021   Procedure: POLYPECTOMY;  Surgeon: Meryl Dare, MD;  Location: [facility];  Service: Gastroenterology;;   TEE WITHOUT CARDIOVERSION N/A 07/09/2021   Procedure: TRANSESOPHAGEAL ECHOCARDIOGRAM (TEE);  Surgeon: Steffanie Dunn  T, MD;  Location: [facility];  Service: Cardiovascular;  Laterality: N/A;   TEE WITHOUT CARDIOVERSION N/A 08/20/2021   Procedure: TRANSESOPHAGEAL ECHOCARDIOGRAM (TEE);  Surgeon: Christell Constant, MD;  Location: [facility] ENDOSCOPY;  Service: Cardiovascular;  Laterality: N/A;   TEE WITHOUT CARDIOVERSION N/A 01/08/2022   Procedure: TRANSESOPHAGEAL ECHOCARDIOGRAM (TEE);  Surgeon: Wendall Stade, MD;  Location: [facility] ENDOSCOPY;  Service: Cardiovascular;  Laterality:  N/A;   TRANSTHORACIC ECHOCARDIOGRAM  09/17/2014   ef 50-55%/  trivial AR, MR and PR/  mild LAE and RAE/  mild TR    Allergies: Aspirin  Medications: Prior to Admission medications   Medication Sig Start Date End Date Taking? Authorizing Provider  ascorbic acid (VITAMIN C) 1000 MG tablet Take 1,000 mg by mouth in the morning.    [provider]  atorvastatin (LIPITOR) 20 MG tablet TAKE 1 TABLET (20 MG TOTAL) BY MOUTH EVERY MORNING. 01/07/22   Joaquim Nam, MD  clopidogrel (PLAVIX) 75 MG tablet Take 1 tablet (75 mg total) by mouth daily. 01/12/22   Georgie Chard D, NP  loperamide (IMODIUM A-D) 2 MG tablet Take 1 tablet (2 mg total) by mouth daily as needed for diarrhea or loose stools. 01/12/22   Joaquim Nam, MD  losartan (COZAAR) 25 MG tablet TAKE 1/2 TABLET EVERY MORNING 06/07/22   Joaquim Nam, MD  metoprolol succinate (TOPROL-XL) 50 MG 24 hr tablet Take 1 tablet (50 mg) by mouth in the morning. Take 1/2 tablet (25 mg) in the evening. Take with or immediately following a meal. 08/18/22   Sherie Don, NP  metoprolol tartrate (LOPRESSOR) 50 MG tablet Take 1 tablet (50 mg total) by mouth once for 1 dose. 2 hours prior to cardiac test as directed 08/18/22 08/18/22  Sherie Don, NP  Multiple Minerals-Vitamins (CALCIUM 600+D3 PLUS MINERALS) TABS Take 1 tablet by mouth in the morning.    [provider]  Multiple Vitamin (MULTIVITAMIN WITH MINERALS) TABS tablet Take 1 tablet by mouth daily. Centrum    [provider]  Omega-3 Fatty Acids (FISH OIL PO) Take 1 capsule by mouth in the morning.    [provider]  tamsulosin (FLOMAX) 0.4 MG CAPS capsule TAKE 2 CAPSULES (0.8 MG TOTAL) BY MOUTH DAILY. 11/05/21   Joaquim Nam, MD     Family History  Problem Relation Age of Onset   Colon cancer Mother 98   Heart disease Sister        MI, pace maker   Kidney disease Sister        renal failure, nephrectomy- not cancer   Hypertension Sister     Hypertension Sister    Colon polyps Brother    Lung cancer Brother    Diabetes Neg Hx        DM   Depression Neg Hx    Stroke Neg Hx    Alcohol abuse Neg Hx        also no drug abuse    Prostate cancer Neg Hx    Liver cancer Neg Hx    Rectal cancer Neg Hx    Stomach cancer Neg Hx     Social History   Socioeconomic History   Marital status: Married    Spouse name: Not on file   Number of children: 0   Years of education: Not on file   Highest education level: 12th grade  Occupational History   Occupation: truck Hospital doctor- retired    Associate Professor: RETIRED  Tobacco Use  Smoking status: Never   Smokeless tobacco: Never  Vaping Use   Vaping Use: Never used  Substance and Sexual Activity   Alcohol use: Not Currently    Comment: rarely drinks beer   Drug use: No   Sexual activity: Not Currently  Other Topics Concern   Not on file  Social History Narrative   Married 2003, no children.    Retired Naval architect, gets regular exercise- walking.    Step daughter is MD in [place]   Social Determinants of Health   Financial Resource Strain: Low Risk  (09/15/2022)   Overall Financial Resource Strain (CARDIA)    Difficulty of Paying Living Expenses: Not hard at all  Food Insecurity: No Food Insecurity (09/15/2022)   Hunger Vital Sign    Worried About Running Out of Food in the Last Year: Never true    Ran Out of Food in the Last Year: Never true  Transportation Needs: No Transportation Needs (09/15/2022)   PRAPARE - Administrator, Civil Service (Medical): No    Lack of Transportation (Non-Medical): No  Physical Activity: Insufficiently Active (09/15/2022)   Exercise Vital Sign    Days of Exercise per Week: 2 days    Minutes of Exercise per Session: 20 min  Stress: No Stress Concern Present (09/15/2022)   [employer] of Occupational Health - Occupational Stress Questionnaire    Feeling of Stress : Not at all  Social Connections: Socially Integrated (09/15/2022)   Social  Connection and Isolation Panel [NHANES]    Frequency of Communication with Friends and Family: More than three times a week    Frequency of Social Gatherings with Friends and Family: Twice a week    Attends Religious Services: More than 4 times per year    Active Member of [employer] or Organizations: Yes    Attends Banker Meetings: 1 to 4 times per year    Marital Status: Married      Review of Systems  Vital Signs:   Code Status:    Physical Exam  Imaging: CT CORONARY MORPH W/CTA COR W/SCORE W/CA W/CM &/OR WO/CM  Addendum Date: 09/14/2022   ADDENDUM REPORT: 09/14/2022 10:54 EXAM: OVER-READ INTERPRETATION  CT CHEST The following report is an over-read performed by radiologist Dr. Narda Rutherford of [facility] on 09/14/2022. This over-read does not include interpretation of cardiac or coronary anatomy or pathology. The coronary CTA interpretation by the cardiologist is attached. COMPARISON:  Cardiac CT 05/08/2021 FINDINGS: Vascular: Mild aortic atherosclerosis. The included aorta is normal in caliber. Mediastinum/nodes: Shotty mediastinal lymph nodes are not enlarged by size criteria. Patulous esophagus. Lungs: Mild dependent ground-glass opacities in the lower lobes. Left lower lobe bronchiectasis. No pulmonary nodule. No pleural fluid. The included airways are patent. Upper abdomen: No acute or unexpected findings. Musculoskeletal: There are no acute or suspicious osseous abnormalities. Thoracic spondylosis with anterior spurring. IMPRESSION: 1.  Aortic Atherosclerosis (ICD10-I70.0). 2. Mild left lower lobe bronchiectasis. Electronically Signed   By: Narda Rutherford M.D.   On: 09/14/2022 10:54   Result Date: 09/14/2022 CLINICAL DATA:  Abnormal ekg, hx of aflutter s/p watchman. Evaluate CAD EXAM: Cardiac/Coronary  CTA TECHNIQUE: The patient was scanned on a Siemens Somatom go.Top scanner. : A retrospective scan was triggered in the ascending thoracic aorta. Axial  non-contrast 3 mm slices were carried out through the heart. The data set was analyzed on a dedicated work station and scored using the Agatson method. Gantry rotation speed was 330 msecs and collimation  was .6 mm. 50mg  of metoprolol and 0.8 mg of sl NTG was given. The 3D data set was reconstructed in 5% intervals of the 60-95 % of the R-R cycle. Diastolic phases were analyzed on a dedicated work station using MPR, MIP and VRT modes. The patient received 75 cc of contrast. FINDINGS: Aorta:  Normal size.  No calcifications.  No dissection. Aortic Valve:  Trileaflet.  No calcifications. Coronary Arteries:  Normal coronary origin.  Right dominance. RCA is a dominant artery. There is calcified plaque in the mid RCA causing minimal stenosis (<25%). Left main gives rise to LAD and LCX arteries. LM has no disease. LAD has calcified plaque proximally causing mild stenosis (25%). LCX is a non-dominant artery.  There is no plaque. Other findings: Normal pulmonary vein drainage into the left atrium. Watchman device noted in appendage, in place with no evidence for leak. Normal size of the pulmonary artery. IMPRESSION: 1. Coronary calcium score of 895. This was 75th percentile for age and sex matched control. 2. Normal coronary origin with right dominance. 3. Mild proximal LAD stenosis (25%). 4. Minimal proximal RCA stenosis (<25%). 5. CAD-RADS 2. Mild non-obstructive CAD (25-49%). Consider preventive therapy and risk factor modification. 6. Watchman device noted in place with no evidence for leak. Electronically Signed: By: Debbe Odea M.D. On: 09/09/2022 14:32    Labs:  CBC: Recent Labs    01/01/22 1112 06/07/22 0934 07/13/22 0834 08/17/22 1022  WBC 4.4 5.3 6.2 4.5  HGB 11.0* 10.3* 10.7* 10.0*  HCT 34.8* 31.8* 33.5* 31.8*  PLT 318 307.0 285.0 269    COAGS: No results for input(s): "INR", "APTT" in the last 8760 hours.  BMP: Recent Labs    11/23/21 0825 01/01/22 1112 06/07/22 0934 08/17/22 1022   NA 137 139 136 137  K 4.5 4.3 4.3 4.0  CL 101 101 100 103  CO2 29 25 28 28   GLUCOSE 129* 139* 142* 119*  BUN 10 11 12 8   CALCIUM 9.2 9.3 9.0 9.4  CREATININE 0.87 0.86 0.81 0.83  GFRNONAA  --   --   --  >60    LIVER FUNCTION TESTS: Recent Labs    11/30/21 0948 08/17/22 1022  BILITOT 0.5 0.8  AST 23 20  ALT 19 18  ALKPHOS 58 65  PROT 7.3 8.0  ALBUMIN 4.1 4.2    TUMOR MARKERS: No results for input(s): "AFPTM", "CEA", "CA199", "CHROMGRNA" in the last 8760 hours.  Assessment and Plan: 77 y.o. male with PMH sig for GERD, gastritis, hypertension, HLD,HTN, nonischemic cardiomyopathy, DM, Watchman left atrial appendage closure device who presents now with persistent normocytic anemia of uncertain etiology. He is scheduled today for CT guided bone marrow biopsy for further evaluation.Risks and benefits of procedure was discussed with the patient  including, but not limited to bleeding, infection, damage to adjacent structures or low yield requiring additional tests.  All of the questions were answered and there is agreement to proceed.  Consent signed and in chart.    Thank you for this interesting consult.  I greatly enjoyed meeting Elohim L Krakowski and look forward to participating in their care.  A copy of this report was sent to the requesting provider on this date.  Electronically Signed: D. Jeananne Rama, PA-C 09/15/2022, 4:49 PM   I spent a total of  20 minutes   in face to face in clinical consultation, greater than 50% of which was counseling/coordinating care for CT guided bone marrow biopsy

## 2022-09-16 ENCOUNTER — Ambulatory Visit (HOSPITAL_COMMUNITY)
Admission: RE | Admit: 2022-09-16 | Discharge: 2022-09-16 | Disposition: A | Discharge status: Home / Self Care | Payer: Medicare Other | Source: Ambulatory Visit | Attending: Physician Assistant | Admitting: Physician Assistant

## 2022-09-16 ENCOUNTER — Other Ambulatory Visit: Payer: Self-pay

## 2022-09-16 ENCOUNTER — Encounter (HOSPITAL_COMMUNITY): Payer: Self-pay

## 2022-09-16 ENCOUNTER — Other Ambulatory Visit: Payer: Self-pay | Admitting: Physician Assistant

## 2022-09-16 DIAGNOSIS — D649 Anemia, unspecified: Secondary | ICD-10-CM | POA: Diagnosis not present

## 2022-09-16 DIAGNOSIS — D61818 Other pancytopenia: Secondary | ICD-10-CM | POA: Diagnosis not present

## 2022-09-16 DIAGNOSIS — M898X8 Other specified disorders of bone, other site: Secondary | ICD-10-CM | POA: Diagnosis not present

## 2022-09-16 DIAGNOSIS — D7581 Myelofibrosis: Secondary | ICD-10-CM | POA: Insufficient documentation

## 2022-09-16 DIAGNOSIS — C9440 Acute panmyelosis with myelofibrosis not having achieved remission: Secondary | ICD-10-CM | POA: Diagnosis not present

## 2022-09-16 LAB — CBC WITH DIFFERENTIAL/PLATELET
Abs Immature Granulocytes: 0.03 10*3/uL (ref 0.00–0.07)
Basophils Absolute: 0 10*3/uL (ref 0.0–0.1)
Basophils Relative: 1 %
Eosinophils Absolute: 0 10*3/uL (ref 0.0–0.5)
Eosinophils Relative: 0 %
HCT: 29.1 % — ABNORMAL LOW (ref 39.0–52.0)
Hemoglobin: 8.7 g/dL — ABNORMAL LOW (ref 13.0–17.0)
Immature Granulocytes: 1 %
Lymphocytes Relative: 31 %
Lymphs Abs: 1.2 10*3/uL (ref 0.7–4.0)
MCH: 25 pg — ABNORMAL LOW (ref 26.0–34.0)
MCHC: 29.9 g/dL — ABNORMAL LOW (ref 30.0–36.0)
MCV: 83.6 fL (ref 80.0–100.0)
Monocytes Absolute: 0.2 10*3/uL (ref 0.1–1.0)
Monocytes Relative: 5 %
Neutro Abs: 2.5 10*3/uL (ref 1.7–7.7)
Neutrophils Relative %: 62 %
Platelets: 131 10*3/uL — ABNORMAL LOW (ref 150–400)
RBC: 3.48 MIL/uL — ABNORMAL LOW (ref 4.22–5.81)
RDW: 23.1 % — ABNORMAL HIGH (ref 11.5–15.5)
WBC: 3.9 10*3/uL — ABNORMAL LOW (ref 4.0–10.5)
nRBC: 1 % — ABNORMAL HIGH (ref 0.0–0.2)

## 2022-09-16 MED ORDER — MIDAZOLAM HCL 2 MG/2ML IJ SOLN
INTRAMUSCULAR | Status: AC | PRN
  Administered 2022-09-16 (×3): 1 mg via INTRAVENOUS

## 2022-09-16 MED ORDER — SODIUM CHLORIDE 0.9 % IV SOLN: INTRAVENOUS | Status: DC

## 2022-09-16 MED ORDER — FENTANYL CITRATE (PF) 100 MCG/2ML IJ SOLN
INTRAMUSCULAR | Status: AC | PRN
  Administered 2022-09-16 (×3): 50 ug via INTRAVENOUS

## 2022-09-16 MED ORDER — FLUMAZENIL 0.5 MG/5ML IV SOLN
INTRAVENOUS | Status: AC
  Filled 2022-09-16: qty 5

## 2022-09-16 MED ORDER — NALOXONE HCL 0.4 MG/ML IJ SOLN
INTRAMUSCULAR | Status: AC
  Filled 2022-09-16: qty 1

## 2022-09-16 MED ORDER — LIDOCAINE HCL (PF) 1 % IJ SOLN
INTRAMUSCULAR | Status: AC | PRN
  Administered 2022-09-16: 10 mL

## 2022-09-16 MED ORDER — FENTANYL CITRATE (PF) 100 MCG/2ML IJ SOLN
INTRAMUSCULAR | Status: AC
  Filled 2022-09-16: qty 4

## 2022-09-16 MED ORDER — MIDAZOLAM HCL 2 MG/2ML IJ SOLN
INTRAMUSCULAR | Status: AC
  Filled 2022-09-16: qty 4

## 2022-09-16 NOTE — Sedation Documentation (Signed)
Sample #4 obtained 

## 2022-09-16 NOTE — Sedation Documentation (Addendum)
Sample #3 obtained 

## 2022-09-16 NOTE — Sedation Documentation (Addendum)
Sample #2 obtained 

## 2022-09-16 NOTE — Sedation Documentation (Addendum)
Sample #6 obtained 

## 2022-09-16 NOTE — Discharge Instructions (Addendum)
Bone Marrow Aspiration and Bone Marrow Biopsy, Adult, Care After  The following information offers guidance on how to care for yourself after your procedure. Your health care provider may also give you more specific instructions. If you have problems or questions, contact your health care provider.  What can I expect after the procedure?  May remove dressing or bandaid and shower tomorrow.  Keep site clean and dry. Replace with clean dressing or bandaid as necessary. Urgent needs IR clinic [phone] (mon-fri 8-5).  After the procedure, it is common to have: Mild pain and tenderness. Swelling. Bruising. Follow these instructions at home: Incision care  Follow instructions from your health care provider about how to take care of the incision site. Make sure you: Wash your hands with soap and water for at least 20 seconds before and after you change your bandage (dressing). If soap and water are not available, use hand sanitizer. Change your dressing as told by your health care provider. Leave stitches (sutures), skin glue, or adhesive strips in place. These skin closures may need to stay in place for 2 weeks or longer. If adhesive strip edges start to loosen and curl up, you may trim the loose edges. Do not remove adhesive strips completely unless your health care provider tells you to do that. Check your incision site every day for signs of infection. Check for: More redness, swelling, or pain. Fluid or blood. Warmth. Pus or a bad smell. Activity Return to your normal activities as told by your health care provider. Ask your health care provider what activities are safe for you. Do not lift anything that is heavier than 10 lb (4.5 kg), or the limit that you are told, until your health care provider says that it is safe. If you were given a sedative during the procedure, it can affect you for  several hours. Do not drive or operate machinery until your health care provider says that it is safe. General instructions  Take over-the-counter and prescription medicines only as told by your health care provider. Do not take baths, swim, or use a hot tub until your health care provider approves. Ask your health care provider if you may take showers. You may only be allowed to take sponge baths. If directed, put ice on the affected area. To do this: Put ice in a plastic bag. Place a towel between your skin and the bag. Leave the ice on for 20 minutes, 2-3 times a day. If your skin turns bright red, remove the ice right away to prevent skin damage. The risk of skin damage is higher if you cannot feel pain, heat, or cold. Contact a health care provider if: You have signs of infection. Your pain is not controlled with medicine. You have cancer, and a temperature of 100.4F (38C) or higher. Get help right away if: You have a temperature of 101F (38.3C) or higher, or as told by your health care provider. You have bleeding from the incision site that cannot be controlled. This information is not intended to replace advice given to you by your health care provider. Make sure you discuss any questions you have with your health care provider. Document Revised: 08/31/2021 Document Reviewed: 08/31/2021 Elsevier Patient Education  2023 Elsevier Inc.                            Moderate Conscious Sedation, Adult, Care After  This sheet gives you information about how to care   for yourself after your procedure. Your health care provider may also give you more specific instructions. If you have problems or questions, contact your health care provider. What can I expect after the procedure? After the procedure, it is common to have: Sleepiness for several hours. Impaired judgment for several hours. Difficulty with balance. Vomiting if you eat too soon. Follow these instructions at home: For  the time period you were told by your health care provider:     Rest. Do not participate in activities where you could fall or become injured. Do not drive or use machinery. Do not drink alcohol. Do not take sleeping pills or medicines that cause drowsiness. Do not make important decisions or sign legal documents. Do not take care of children on your own. Eating and drinking  Follow the diet recommended by your health care provider. Drink enough fluid to keep your urine pale yellow. If you vomit: Drink water, juice, or soup when you can drink without vomiting. Make sure you have little or no nausea before eating solid foods. General instructions Take over-the-counter and prescription medicines only as told by your health care provider. Have a responsible adult stay with you for the time you are told. It is important to have someone help care for you until you are awake and alert. Do not smoke. Keep all follow-up visits as told by your health care provider. This is important. Contact a health care provider if: You are still sleepy or having trouble with balance after 24 hours. You feel light-headed. You keep feeling nauseous or you keep vomiting. You develop a rash. You have a fever. You have redness or swelling around the IV site. Get help right away if: You have trouble breathing. You have new-onset confusion at home. Summary After the procedure, it is common to feel sleepy, have impaired judgment, or feel nauseous if you eat too soon. Rest after you get home. Know the things you should not do after the procedure. Follow the diet recommended by your health care provider and drink enough fluid to keep your urine pale yellow. Get help right away if you have trouble breathing or new-onset confusion at home. This information is not intended to replace advice given to you by your health care provider. Make sure you discuss any questions you have with your health care  provider. Document Revised: 08/24/2019 Document Reviewed: 03/22/2019 Elsevier Patient Education  2023 Elsevier Inc.  

## 2022-09-16 NOTE — Procedures (Signed)
Pre-procedure Diagnosis: Persistent normocytic anemia of uncertain etiology  Post-procedure Diagnosis: Same  Technically successful CT guided bone marrow aspiration and biopsy of left iliac crest.  Technically successful CT guided biopsy of indeterminate lytic lesion within the left iliac crest.   Complications: None Immediate EBL: Trace  Signed: Simonne Come Pager: [phone] 09/16/2022, 11:33 AM

## 2022-09-16 NOTE — Sedation Documentation (Signed)
Sample #5 obtained 

## 2022-09-16 NOTE — Sedation Documentation (Signed)
Sample #1 obtained 

## 2022-09-17 ENCOUNTER — Ambulatory Visit (INDEPENDENT_AMBULATORY_CARE_PROVIDER_SITE_OTHER)
Admission: RE | Admit: 2022-09-17 | Discharge: 2022-09-17 | Disposition: A | Discharge status: Home / Self Care | Payer: Medicare Other | Source: Ambulatory Visit | Attending: Family Medicine | Admitting: Family Medicine

## 2022-09-17 ENCOUNTER — Ambulatory Visit (INDEPENDENT_AMBULATORY_CARE_PROVIDER_SITE_OTHER): Payer: Medicare Other | Admitting: Family Medicine

## 2022-09-17 ENCOUNTER — Encounter: Payer: Self-pay | Admitting: Family Medicine

## 2022-09-17 VITALS — BP 120/68 | HR 67 | Temp 97.2°F | Ht 72.0 in | Wt 201.0 lb

## 2022-09-17 DIAGNOSIS — M542 Cervicalgia: Secondary | ICD-10-CM

## 2022-09-17 DIAGNOSIS — R202 Paresthesia of skin: Secondary | ICD-10-CM | POA: Diagnosis not present

## 2022-09-17 MED ORDER — GABAPENTIN 100 MG PO CAPS: 100.0000 mg | ORAL_CAPSULE | Freq: Three times a day (TID) | ORAL | 1 refills | Status: DC | PRN

## 2022-09-17 NOTE — Patient Instructions (Addendum)
Go to the lab on the way out.   If you have mychart we'll likely use that to update you.    Take care.  Glad to see you. Start gabapentin once a day.  If tolerated and needed, then gradually increase up to 3 a day.

## 2022-09-17 NOTE — Progress Notes (Unsigned)
R sided neck pain.  Started last month.  Also gets tingling in his arm and sometimes goes up to his chin.  No trauma or trigger.  Intermittent.  No L sided sx.  Tilting head to R makes it more likely to happen.  Doesn't cross midline.  Normal grip.  No rash.  Stinging pain.    I am awaiting his path report from his biopsy.  He is sore from that but improving.  Discussed data available at the time of office visit:  1. Technically successful CT-guided biopsy of indeterminate lytic lesion within the left ilium. 2. Technically successful CT guided left iliac bone marrow aspiration and core biopsy.  Meds, vitals, and allergies reviewed.   ROS: Per HPI unless specifically indicated in ROS section   Nad Ncat Neck supple, no LA Rrr Ctab Abd soft not ttp S/S wnl BUE.   Midline neck nontender to palpation.  No local skin changes.  When he does have pain it follows the distribution of the R C2 nerve root.

## 2022-09-19 DIAGNOSIS — M542 Cervicalgia: Secondary | ICD-10-CM | POA: Insufficient documentation

## 2022-09-19 NOTE — Assessment & Plan Note (Signed)
When he does have pain it follows the distribution of the R C2 nerve root.   No weakness.  No rash.  See notes on imaging.  Discussed checking plain films today. Start gabapentin once a day.  If tolerated and needed, then gradually increase up to 3 a day.  Okay for outpatient follow-up.  Routine gabapentin cautions discussed with patient.  I will await his hematology follow-up notes.  I suspect this is an unrelated issue.

## 2022-09-21 LAB — SURGICAL PATHOLOGY

## 2022-09-24 ENCOUNTER — Encounter (HOSPITAL_COMMUNITY): Payer: Self-pay

## 2022-09-28 ENCOUNTER — Telehealth: Payer: Self-pay | Admitting: Physician Assistant

## 2022-09-28 DIAGNOSIS — D7581 Myelofibrosis: Secondary | ICD-10-CM

## 2022-09-28 NOTE — Telephone Encounter (Signed)
I called [name] to review the bone marrow biopsy results from 09/16/2022. 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 Dr. Leonides Fry recommends additional labs to check MPN panel which we have scheduled for tomorrow. Dr. Leonides Fry will see patient next week to review upcoming lab results and final diagnosis. [name] expressed understanding of the plan provided.

## 2022-09-29 ENCOUNTER — Telehealth: Payer: Self-pay | Admitting: Hematology and Oncology

## 2022-09-29 ENCOUNTER — Other Ambulatory Visit: Payer: Self-pay

## 2022-09-29 ENCOUNTER — Inpatient Hospital Stay: Payer: Medicare Other | Attending: Physician Assistant

## 2022-09-29 ENCOUNTER — Telehealth: Payer: Self-pay | Admitting: Physician Assistant

## 2022-09-29 DIAGNOSIS — D7581 Myelofibrosis: Secondary | ICD-10-CM | POA: Diagnosis not present

## 2022-09-29 DIAGNOSIS — D649 Anemia, unspecified: Secondary | ICD-10-CM | POA: Insufficient documentation

## 2022-09-29 NOTE — Telephone Encounter (Signed)
Called patient per 5/21 staff message. Patient scheduled and notified.

## 2022-09-30 ENCOUNTER — Encounter (HOSPITAL_COMMUNITY): Payer: Self-pay

## 2022-09-30 ENCOUNTER — Telehealth: Payer: Self-pay | Admitting: Family Medicine

## 2022-09-30 DIAGNOSIS — M542 Cervicalgia: Secondary | ICD-10-CM

## 2022-09-30 NOTE — Telephone Encounter (Signed)
Patient contacted the office requesting to leave a message. Patient states Dr Esau Grew recommended he begin taking his gabapentin 3x daily instead of 1x. States he has been doing that, says he is not in as much pain but still experiences some. Wanted to leave this message as an update on how he is feeling. Please advise, thank you.

## 2022-10-01 MED ORDER — GABAPENTIN 100 MG PO CAPS
100.0000 mg | ORAL_CAPSULE | Freq: Three times a day (TID) | ORAL | 1 refills | Status: DC | PRN
Start: 2022-10-01 — End: 2022-12-03

## 2022-10-01 MED ORDER — GABAPENTIN 100 MG PO CAPS: 100.0000 mg | ORAL_CAPSULE | Freq: Three times a day (TID) | ORAL | Status: DC | PRN

## 2022-10-01 NOTE — Addendum Note (Signed)
Addended by: Joaquim Nam on: 10/01/2022 01:52 PM   Modules accepted: Orders

## 2022-10-01 NOTE — Telephone Encounter (Signed)
Rx called in 

## 2022-10-01 NOTE — Addendum Note (Signed)
Addended by: Joaquim Nam on: 10/01/2022 07:01 AM   Modules accepted: Orders

## 2022-10-01 NOTE — Telephone Encounter (Signed)
He could still increase up to 200mg  per dose, up to 3 times per day.  I would increase gradually, 100mg -100mg -200mg  for a few days, then 200/100/200mg , then 200mg  3 times a day if needed.  Let me know if he needs a refill.   Does he want to see the spine clinic about his situation?  We can refer if needed.  Please let me know.  Thanks.

## 2022-10-01 NOTE — Telephone Encounter (Signed)
Called patient reviewed all information and repeated back to me. Will call if any questions.  He will work on increasing the dose as directed.  Will need refill sent to [facility] on [address].  Would like referral sent to [place] for spine.

## 2022-10-01 NOTE — Telephone Encounter (Signed)
Rx sent, referral placed.  Thanks.

## 2022-10-06 ENCOUNTER — Inpatient Hospital Stay (HOSPITAL_BASED_OUTPATIENT_CLINIC_OR_DEPARTMENT_OTHER): Payer: Medicare Other | Admitting: Hematology and Oncology

## 2022-10-06 VITALS — BP 131/64 | HR 66 | Temp 97.4°F | Resp 14 | Wt 199.3 lb

## 2022-10-06 DIAGNOSIS — D7581 Myelofibrosis: Secondary | ICD-10-CM | POA: Diagnosis not present

## 2022-10-06 DIAGNOSIS — D649 Anemia, unspecified: Secondary | ICD-10-CM

## 2022-10-06 NOTE — Progress Notes (Signed)
[facility] [phone]   [phone]  PROGRESS NOTE  Patient Care Team: Joaquim Nam, MD as PCP - General (Family Medicine) Lanier Prude, MD as PCP - Electrophysiology (Cardiology) Jake Bathe, MD as PCP - Cardiology (Cardiology) Vilinda Flake, [facility] as Pharmacist (Pharmacist)  Hematological/Oncological History # Myelofibrosis, Grade II. IPSS Intermediate Risk-2  07/13/2022: White blood cell 6.2, hemoglobin 10.7, MCV 79.3, and platelets of 285 08/17/2022: Establish care with Dr. Leonides Schanz.  White blood cell 4.5, hemoglobin 10.0, MCV 80.9, and platelets of 269 09/16/2022: Bone marrow biopsy performed.  Findings show myelofibrosis, grade 2.  Interval History:  Paul Fry 77 y.o. male with medical history significant for newly diagnosed myelofibrosis who presents for a follow up visit. The patient's last visit was on 08/17/2022. In the interim since the last visit he had a bone marrow biopsy which confirmed the diagnosis of myelofibrosis.  On exam today Paul Fry reports that his bone marrow biopsy went well.  He reports he slept during the procedure.  He notes he had no other changes in his health in the interim since her last visit.  He is not having any fevers, chills, sweats, nausea, vomiting or diarrhea.  He reports his energy levels are good and he is able to do yard work but does not have the endurance that he used to.  He is not having any overt signs of bleeding, bruising, or dark stools.  A full 10 point ROS is otherwise negative.  The bulk of our discussion focused on the diagnosis of myelofibrosis and what to expect moving forward.  Details of this conversation are noted below.  MEDICAL HISTORY:  Past Medical History:  Diagnosis Date   Allergy    SEASONAL   Anemia    Arthritis    KNEES   BPH (benign prostatic hyperplasia)    Difficult airway for intubation    Difficult airway for intubation 08/25/2021   Diverticulosis of  colon    Elevated PSA    1.22 on 01-20-2016   Feeling of incomplete bladder emptying    GERD (gastroesophageal reflux disease)    Hiatal hernia    History of adenomatous polyp of colon    2014 tubular adenoma   History of chronic gastritis    History of Helicobacter pylori infection    07/ 2016   History of lower GI bleeding    07/ 2010 and 12/ 2011  diverticular bleed both times   Hyperlipidemia    Hypertension    Lower urinary tract symptoms (LUTS)    Nonischemic cardiomyopathy (HCC)    Plantar fascia syndrome    Presence of Watchman left atrial appendage closure device 07/09/2021   Watchman FLX 31mm with Dr. Lalla Brothers   Strains to urinate    INTERMITTANT   Type 2 diabetes, diet controlled (HCC)    ON BORDER LINE,NEVER TOOK MEDICATION.   Wears glasses     SURGICAL HISTORY: Past Surgical History:  Procedure Laterality Date   APPENDECTOMY  teenager   ATRIAL FIBRILLATION ABLATION N/A 05/15/2021   Procedure: ATRIAL FIBRILLATION ABLATION;  Surgeon: Lanier Prude, MD;  Location: [facility] INVASIVE CV LAB;  Service: Cardiovascular;  Laterality: N/A;   BIOPSY  11/19/2021   Procedure: BIOPSY;  Surgeon: Meryl Dare, MD;  Location: [facility] ENDOSCOPY;  Service: Gastroenterology;;   CARDIAC CATHETERIZATION  09-10-2008   dr dalton mclean   mild non-obstructive CAD;  30-40% mRCA, 20%pLAD,  ef 55%   CARDIOVASCULAR STRESS TEST  07/29/2011   Low risk nuclear study w/ no ischemia or infarct/  LVEF 47% and  apical hypokinesis (08-07-2012 cardiac MRI -- EF 49% w/ no hyperenhancement distal septal and apical hypokinesis, mild LAE, mild RVE, mild AR with mild dilated sinus 39mm)   CATARACT EXTRACTION W/ INTRAOCULAR LENS  IMPLANT, BILATERAL  2016   COLONOSCOPY  last one 12-21-2012   COLONOSCOPY WITH PROPOFOL N/A 11/19/2021   Procedure: COLONOSCOPY WITH PROPOFOL;  Surgeon: Meryl Dare, MD;  Location: [facility];  Service: Gastroenterology;  Laterality: N/A;   CYSTOSCOPY WITH INSERTION OF UROLIFT  N/A 06/17/2016   Procedure: CYSTOSCOPY WITH INSERTION OF UROLIFT;  Surgeon: Jethro Bolus, MD;  Location: [facility];  Service: Urology;  Laterality: N/A;   ESOPHAGOGASTRODUODENOSCOPY  last one 11-22-2014   LEFT ATRIAL APPENDAGE OCCLUSION N/A 07/09/2021   Procedure: LEFT ATRIAL APPENDAGE OCCLUSION;  Surgeon: Lanier Prude, MD;  Location: [facility] INVASIVE CV LAB;  Service: Cardiovascular;  Laterality: N/A;   POLYPECTOMY  11/19/2021   Procedure: POLYPECTOMY;  Surgeon: Meryl Dare, MD;  Location: [facility];  Service: Gastroenterology;;   TEE WITHOUT CARDIOVERSION N/A 07/09/2021   Procedure: TRANSESOPHAGEAL ECHOCARDIOGRAM (TEE);  Surgeon: Lanier Prude, MD;  Location: [facility] INVASIVE CV LAB;  Service: Cardiovascular;  Laterality: N/A;   TEE WITHOUT CARDIOVERSION N/A 08/20/2021   Procedure: TRANSESOPHAGEAL ECHOCARDIOGRAM (TEE);  Surgeon: Christell Constant, MD;  Location[facility];  Service: Cardiovascular;  Laterality: N/A;   TEE WITHOUT CARDIOVERSION N/A 01/08/2022   Procedure: TRANSESOPHAGEAL ECHOCARDIOGRAM (TEE);  Surgeon: Wendall Stade, MD;  Location: [facility] ENDOSCOPY;  Service: Cardiovascular;  Laterality: N/A;   TRANSTHORACIC ECHOCARDIOGRAM  09/17/2014   ef 50-55%/  trivial AR, MR and PR/  mild LAE and RAE/  mild TR    SOCIAL HISTORY: Social History   Socioeconomic History   Marital status: Married    Spouse name: Not on file   Number of children: 0   Years of education: Not on file   Highest education level: 12th grade  Occupational History   Occupation: truck Hospital doctor- retired    Associate Professor: RETIRED  Tobacco Use   Smoking status: Never   Smokeless tobacco: Never  Vaping Use   Vaping Use: Never used  Substance and Sexual Activity   Alcohol use: Not Currently    Comment: rarely drinks beer   Drug use: No   Sexual activity: Not Currently  Other Topics Concern   Not on file  Social History Narrative   Married 2003, no children.    Retired Ecologist, gets regular exercise- walking.    Step daughter is MD in [place]   Social Determinants of Health   Financial Resource Strain: Low Risk  (09/15/2022)   Overall Financial Resource Strain (CARDIA)    Difficulty of Paying Living Expenses: Not hard at all  Food Insecurity: No Food Insecurity (09/15/2022)   Hunger Vital Sign    Worried About Running Out of Food in the Last Year: Never true    Ran Out of Food in the Last Year: Never true  Transportation Needs: No Transportation Needs (09/15/2022)   PRAPARE - Administrator, Civil Service (Medical): No    Lack of Transportation (Non-Medical): No  Physical Activity: Insufficiently Active (09/15/2022)   Exercise Vital Sign    Days of Exercise per Week: 2 days    Minutes of Exercise per Session: 20 min  Stress: No Stress Concern Present (09/15/2022)   [employer] of Occupational Health - Occupational  Stress Questionnaire    Feeling of Stress : Not at all  Social Connections: Socially Integrated (09/15/2022)   Social Connection and Isolation Panel [NHANES]    Frequency of Communication with Friends and Family: More than three times a week    Frequency of Social Gatherings with Friends and Family: Twice a week    Attends Religious Services: More than 4 times per year    Active Member of [employer] or Organizations: Yes    Attends Banker Meetings: 1 to 4 times per year    Marital Status: Married  Catering manager Violence: Not At Risk (08/17/2022)   Humiliation, Afraid, Rape, and Kick questionnaire    Fear of Current or Ex-Partner: No    Emotionally Abused: No    Physically Abused: No    Sexually Abused: No    FAMILY HISTORY: Family History  Problem Relation Age of Onset   Colon cancer Mother 75   Heart disease Sister        MI, pace maker   Kidney disease Sister        renal failure, nephrectomy- not cancer   Hypertension Sister    Hypertension Sister    Colon polyps Brother    Lung cancer Brother     Diabetes Neg Hx        DM   Depression Neg Hx    Stroke Neg Hx    Alcohol abuse Neg Hx        also no drug abuse    Prostate cancer Neg Hx    Liver cancer Neg Hx    Rectal cancer Neg Hx    Stomach cancer Neg Hx     ALLERGIES:  is allergic to aspirin.  MEDICATIONS:  Current Outpatient Medications  Medication Sig Dispense Refill   ascorbic acid (VITAMIN C) 1000 MG tablet Take 1,000 mg by mouth in the morning.     atorvastatin (LIPITOR) 20 MG tablet TAKE 1 TABLET (20 MG TOTAL) BY MOUTH EVERY MORNING. 90 tablet 3   clopidogrel (PLAVIX) 75 MG tablet Take 1 tablet (75 mg total) by mouth daily. 90 tablet 2   gabapentin (NEURONTIN) 100 MG capsule Take 1-2 capsules (100-200 mg total) by mouth 3 (three) times daily as needed. 90 capsule 1   loperamide (IMODIUM A-D) 2 MG tablet Take 1 tablet (2 mg total) by mouth daily as needed for diarrhea or loose stools.     losartan (COZAAR) 25 MG tablet TAKE 1/2 TABLET EVERY MORNING 45 tablet 3   metoprolol succinate (TOPROL-XL) 50 MG 24 hr tablet Take 1 tablet (50 mg) by mouth in the morning. Take 1/2 tablet (25 mg) in the evening. Take with or immediately following a meal. 135 tablet 0   Multiple Minerals-Vitamins (CALCIUM 600+D3 PLUS MINERALS) TABS Take 1 tablet by mouth in the morning.     Multiple Vitamin (MULTIVITAMIN WITH MINERALS) TABS tablet Take 1 tablet by mouth daily. Centrum     Omega-3 Fatty Acids (FISH OIL PO) Take 1 capsule by mouth in the morning.     tamsulosin (FLOMAX) 0.4 MG CAPS capsule TAKE 2 CAPSULES (0.8 MG TOTAL) BY MOUTH DAILY. 180 capsule 3   No current facility-administered medications for this visit.    REVIEW OF SYSTEMS:   Constitutional: ( - ) fevers, ( - )  chills , ( - ) night sweats Eyes: ( - ) blurriness of vision, ( - ) double vision, ( - ) watery eyes Ears, nose, mouth, throat, and face: ( - )  mucositis, ( - ) sore throat Respiratory: ( - ) cough, ( - ) dyspnea, ( - ) wheezes Cardiovascular: ( - ) palpitation, ( -  ) chest discomfort, ( - ) lower extremity swelling Gastrointestinal:  ( - ) nausea, ( - ) heartburn, ( - ) change in bowel habits Skin: ( - ) abnormal skin rashes Lymphatics: ( - ) new lymphadenopathy, ( - ) easy bruising Neurological: ( - ) numbness, ( - ) tingling, ( - ) new weaknesses Behavioral/Psych: ( - ) mood change, ( - ) new changes  All other systems were reviewed with the patient and are negative.  PHYSICAL EXAMINATION:  Vitals:   10/06/22 1357  BP: 131/64  Pulse: 66  Resp: 14  Temp: (!) 97.4 F (36.3 C)  SpO2: 97%   Filed Weights   10/06/22 1357  Weight: 199 lb 4.8 oz (90.4 kg)    GENERAL: Well-appearing elderly African-American male, alert, no distress and comfortable SKIN: skin color, texture, turgor are normal, no rashes or significant lesions EYES: conjunctiva are pink and non-injected, sclera clear LUNGS: clear to auscultation and percussion with normal breathing effort HEART: regular rate & rhythm and no murmurs and no lower extremity edema Musculoskeletal: no cyanosis of digits and no clubbing  PSYCH: alert & oriented x 3, fluent speech NEURO: no focal motor/sensory deficits  LABORATORY DATA:  I have reviewed the data as listed    Latest Ref Rng & Units 09/16/2022    9:55 AM 08/17/2022   10:22 AM 07/13/2022    8:34 AM  CBC  WBC 4.0 - 10.5 K/uL 3.9  4.5  6.2   Hemoglobin 13.0 - 17.0 g/dL 8.7  16.1  09.6   Hematocrit 39.0 - 52.0 % 29.1  31.8  33.5   Platelets 150 - 400 K/uL 131  269  285.0        Latest Ref Rng & Units 08/17/2022   10:22 AM 06/07/2022    9:34 AM 01/01/2022   11:12 AM  CMP  Glucose 70 - 99 mg/dL 045  409  811   BUN 8 - 23 mg/dL 8  12  11    Creatinine 0.61 - 1.24 mg/dL 9.14  7.82  9.56   Sodium 135 - 145 mmol/L 137  136  139   Potassium 3.5 - 5.1 mmol/L 4.0  4.3  4.3   Chloride 98 - 111 mmol/L 103  100  101   CO2 22 - 32 mmol/L 28  28  25    Calcium 8.9 - 10.3 mg/dL 9.4  9.0  9.3   Total Protein 6.5 - 8.1 g/dL 8.0     Total Bilirubin  0.3 - 1.2 mg/dL 0.8     Alkaline Phos 38 - 126 U/L 65     AST 15 - 41 U/L 20     ALT 0 - 44 U/L 18       Lab Results  Component Value Date   MPROTEIN Not Observed 08/17/2022   Lab Results  Component Value Date   KPAFRELGTCHN 45.4 (H) 08/17/2022   LAMBDASER 24.5 08/17/2022   KAPLAMBRATIO 1.85 (H) 08/17/2022    RADIOGRAPHIC STUDIES: DG Cervical Spine Complete  Result Date: 09/23/2022 CLINICAL DATA:  77 year old male with right neck paresthesia. EXAM: CERVICAL SPINE - COMPLETE 4+ VIEW COMPARISON:  None Available. FINDINGS: There is no evidence of cervical spine fracture or prevertebral soft tissue swelling. Mild reversal of the normal cervical lordosis centered at C3-C4. Moderate to severe discogenic degenerative change from the  level of C3 to T1, most pronounced at C4-C5. Suggestion of severe bilateral neural foraminal osseous stenosis at C4-C5. No other significant bone abnormalities are identified. IMPRESSION: Moderate to severe discogenic degenerative change from the level of C3 to T1, most pronounced at C4-C5. Electronically Signed   By: Marliss Coots M.D.   On: 09/23/2022 09:35   CT BONE MARROW BIOPSY & ASPIRATION  Result Date: 09/16/2022 INDICATION: Anemia of uncertain etiology. Please perform CT-guided bone marrow biopsy for tissue diagnostic purposes. EXAM: 1. CT-GUIDED BONE MARROW BIOPSY AND ASPIRATION 2. CT-GUIDED BONE LESION BIOPSY MEDICATIONS: None ANESTHESIA/SEDATION: Moderate (conscious) sedation was employed during this procedure as administered by the Interventional Radiology RN. A total of Versed 3 mg and Fentanyl 150 mcg was administered intravenously. Moderate Sedation Time: 19 minutes. The patient's level of consciousness and vital signs were monitored continuously by radiology nursing throughout the procedure under my direct supervision. COMPLICATIONS: None immediate. PROCEDURE: Informed consent was obtained from the patient following an explanation of the procedure, risks,  benefits and alternatives. The patient understands, agrees and consents for the procedure. All questions were addressed. A time out was performed prior to the initiation of the procedure. The patient was positioned prone and non-contrast localization CT was performed of the pelvis to demonstrate the iliac marrow spaces. Preprocedural imaging demonstrated an indeterminate lytic lesion within the left ilium measuring approximately 0.7 x 0.5 cm (image 9, series 2). The operative site was prepped and draped in the usual sterile fashion. Under sterile conditions and local anesthesia, a 22 gauge spinal needle was utilized for procedural planning. Next, an 11 gauge coaxial bone biopsy needle was advanced into the left iliac marrow space targeting the indeterminate lytic lesion within the left ilium. The inner 13 gauge bone biopsy device was utilized to obtain a core needle sample of the lytic lesion (image 9, series 5). Next, the outer 11 gauge device was utilized to obtain an additional core biopsy (image 10, series 5). Next, an 11 gauge coaxial bone biopsy needle was advanced into more caudal aspect of the left iliac marrow space (image 13, series 5). Needle position was confirmed with CT imaging. Initially, a bone marrow aspiration was performed. Next, a bone marrow biopsy was obtained with the 11 gauge outer bone marrow device. The 11 gauge coaxial bone biopsy needle was re-advanced into a slightly different location within the left iliac marrow space, positioning was confirmed with CT imaging and an additional bone marrow biopsy was obtained. Samples were prepared with the cytotechnologist and deemed adequate. The needle was removed and superficial hemostasis was obtained with manual compression. A dressing was applied. The patient tolerated the procedure well without immediate post procedural complication. IMPRESSION: 1. Technically successful CT-guided biopsy of indeterminate lytic lesion within the left ilium. 2.  Technically successful CT guided left iliac bone marrow aspiration and core biopsy. Electronically Signed   By: Simonne Come M.D.   On: 09/16/2022 15:35   CT BONE TROCAR/NEEDLE BIOPSY DEEP  Result Date: 09/16/2022 INDICATION: Anemia of uncertain etiology. Please perform CT-guided bone marrow biopsy for tissue diagnostic purposes. EXAM: 1. CT-GUIDED BONE MARROW BIOPSY AND ASPIRATION 2. CT-GUIDED BONE LESION BIOPSY MEDICATIONS: None ANESTHESIA/SEDATION: Moderate (conscious) sedation was employed during this procedure as administered by the Interventional Radiology RN. A total of Versed 3 mg and Fentanyl 150 mcg was administered intravenously. Moderate Sedation Time: 19 minutes. The patient's level of consciousness and vital signs were monitored continuously by radiology nursing throughout the procedure under my direct supervision. COMPLICATIONS: None  immediate. PROCEDURE: Informed consent was obtained from the patient following an explanation of the procedure, risks, benefits and alternatives. The patient understands, agrees and consents for the procedure. All questions were addressed. A time out was performed prior to the initiation of the procedure. The patient was positioned prone and non-contrast localization CT was performed of the pelvis to demonstrate the iliac marrow spaces. Preprocedural imaging demonstrated an indeterminate lytic lesion within the left ilium measuring approximately 0.7 x 0.5 cm (image 9, series 2). The operative site was prepped and draped in the usual sterile fashion. Under sterile conditions and local anesthesia, a 22 gauge spinal needle was utilized for procedural planning. Next, an 11 gauge coaxial bone biopsy needle was advanced into the left iliac marrow space targeting the indeterminate lytic lesion within the left ilium. The inner 13 gauge bone biopsy device was utilized to obtain a core needle sample of the lytic lesion (image 9, series 5). Next, the outer 11 gauge device was  utilized to obtain an additional core biopsy (image 10, series 5). Next, an 11 gauge coaxial bone biopsy needle was advanced into more caudal aspect of the left iliac marrow space (image 13, series 5). Needle position was confirmed with CT imaging. Initially, a bone marrow aspiration was performed. Next, a bone marrow biopsy was obtained with the 11 gauge outer bone marrow device. The 11 gauge coaxial bone biopsy needle was re-advanced into a slightly different location within the left iliac marrow space, positioning was confirmed with CT imaging and an additional bone marrow biopsy was obtained. Samples were prepared with the cytotechnologist and deemed adequate. The needle was removed and superficial hemostasis was obtained with manual compression. A dressing was applied. The patient tolerated the procedure well without immediate post procedural complication. IMPRESSION: 1. Technically successful CT-guided biopsy of indeterminate lytic lesion within the left ilium. 2. Technically successful CT guided left iliac bone marrow aspiration and core biopsy. Electronically Signed   By: Simonne Come M.D.   On: 09/16/2022 15:35   CT CORONARY MORPH W/CTA COR W/SCORE W/CA W/CM &/OR WO/CM  Addendum Date: 09/14/2022   ADDENDUM REPORT: 09/14/2022 10:54 EXAM: OVER-READ INTERPRETATION  CT CHEST The following report is an over-read performed by radiologist Dr. Narda Rutherford of [facility] on 09/14/2022. This over-read does not include interpretation of cardiac or coronary anatomy or pathology. The coronary CTA interpretation by the cardiologist is attached. COMPARISON:  Cardiac CT 05/08/2021 FINDINGS: Vascular: Mild aortic atherosclerosis. The included aorta is normal in caliber. Mediastinum/nodes: Shotty mediastinal lymph nodes are not enlarged by size criteria. Patulous esophagus. Lungs: Mild dependent ground-glass opacities in the lower lobes. Left lower lobe bronchiectasis. No pulmonary nodule. No pleural fluid.  The included airways are patent. Upper abdomen: No acute or unexpected findings. Musculoskeletal: There are no acute or suspicious osseous abnormalities. Thoracic spondylosis with anterior spurring. IMPRESSION: 1.  Aortic Atherosclerosis (ICD10-I70.0). 2. Mild left lower lobe bronchiectasis. Electronically Signed   By: Narda Rutherford M.D.   On: 09/14/2022 10:54   Result Date: 09/14/2022 CLINICAL DATA:  Abnormal ekg, hx of aflutter s/p watchman. Evaluate CAD EXAM: Cardiac/Coronary  CTA TECHNIQUE: The patient was scanned on a Siemens Somatom go.Top scanner. : A retrospective scan was triggered in the ascending thoracic aorta. Axial non-contrast 3 mm slices were carried out through the heart. The data set was analyzed on a dedicated work station and scored using the Agatson method. Gantry rotation speed was 330 msecs and collimation was .6 mm. 50mg  of metoprolol and 0.8  mg of sl NTG was given. The 3D data set was reconstructed in 5% intervals of the 60-95 % of the R-R cycle. Diastolic phases were analyzed on a dedicated work station using MPR, MIP and VRT modes. The patient received 75 cc of contrast. FINDINGS: Aorta:  Normal size.  No calcifications.  No dissection. Aortic Valve:  Trileaflet.  No calcifications. Coronary Arteries:  Normal coronary origin.  Right dominance. RCA is a dominant artery. There is calcified plaque in the mid RCA causing minimal stenosis (<25%). Left main gives rise to LAD and LCX arteries. LM has no disease. LAD has calcified plaque proximally causing mild stenosis (25%). LCX is a non-dominant artery.  There is no plaque. Other findings: Normal pulmonary vein drainage into the left atrium. Watchman device noted in appendage, in place with no evidence for leak. Normal size of the pulmonary artery. IMPRESSION: 1. Coronary calcium score of 895. This was 75th percentile for age and sex matched control. 2. Normal coronary origin with right dominance. 3. Mild proximal LAD stenosis (25%). 4.  Minimal proximal RCA stenosis (<25%). 5. CAD-RADS 2. Mild non-obstructive CAD (25-49%). Consider preventive therapy and risk factor modification. 6. Watchman device noted in place with no evidence for leak. Electronically Signed: By: Debbe Odea M.D. On: 09/09/2022 14:32    ASSESSMENT & PLAN Glenard L S[place]ton 76 y.o. male with medical history significant for newly diagnosed myelofibrosis who presents for a follow up visit.   Today we discussed the diagnosis of myelofibrosis.  We discussed the incurable nature of this disease.  We also discussed his IPSS risk score.  Unfortunately this is an incurable lifelong condition.  They will likely shorten the patient's duration of life.  Median survival is 4 years after time of diagnosis.  We discussed best supportive care as well as the MPN panel testing.  We discussed supportive medications and transfusions.  The patient voiced understanding of the disease process and the plan moving forward.  # Myelofibrosis, Grade II. IPSS Intermediate Risk-2  -- Bone marrow biopsy results are consistent with a myelofibrosis grade 2.  Patient has an IPSS intermediate risk score.  Median survival is 4 years at diagnosis. --No indication for starting treatment for myelofibrosis at this time. --At time of progression would offer best supportive care. --MPN testing is currently pending with JAK2 with reflex -- Would recommend patient return to clinic in 2 months time for labs in 4 months time for repeat clinic visit.  No orders of the defined types were placed in this encounter.   All questions were answered. The patient knows to call the clinic with any problems, questions or concerns.  A total of more than 30 minutes were spent on this encounter with face-to-face time and non-face-to-face time, including preparing to see the patient, ordering tests and/or medications, counseling the patient and coordination of care as outlined above.   Ulysees Barns,  MD Department of Hematology/Oncology [facility] Phone: [phone] Pager: [phone] Email: [email or web address]  10/06/2022 4:24 PM

## 2022-10-07 ENCOUNTER — Encounter: Payer: Self-pay | Admitting: *Deleted

## 2022-10-08 ENCOUNTER — Ambulatory Visit: Payer: Medicare Other | Admitting: Physician Assistant

## 2022-10-13 DIAGNOSIS — M5412 Radiculopathy, cervical region: Secondary | ICD-10-CM | POA: Diagnosis not present

## 2022-10-13 DIAGNOSIS — Z6827 Body mass index (BMI) 27.0-27.9, adult: Secondary | ICD-10-CM | POA: Diagnosis not present

## 2022-10-14 ENCOUNTER — Encounter: Payer: Self-pay | Admitting: Physician Assistant

## 2022-10-14 ENCOUNTER — Telehealth: Payer: Self-pay | Admitting: Physician Assistant

## 2022-10-14 LAB — BCR ABL1 FISH (GENPATH)

## 2022-10-14 LAB — JAK2 (INCLUDING V617F AND EXON 12), MPL,& CALR W/RFL MPN PANEL (NGS)

## 2022-10-14 NOTE — Telephone Encounter (Signed)
Reviewed MPL mutation with patient and discussed recommendations.

## 2022-10-14 NOTE — Telephone Encounter (Signed)
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 No change in plans for now, will see him back for labs in July with clinic visit in Sept 2024.

## 2022-10-26 ENCOUNTER — Other Ambulatory Visit: Payer: Self-pay | Admitting: Cardiology

## 2022-10-28 DIAGNOSIS — M542 Cervicalgia: Secondary | ICD-10-CM | POA: Diagnosis not present

## 2022-10-28 DIAGNOSIS — M4802 Spinal stenosis, cervical region: Secondary | ICD-10-CM | POA: Diagnosis not present

## 2022-10-28 DIAGNOSIS — M5412 Radiculopathy, cervical region: Secondary | ICD-10-CM | POA: Diagnosis not present

## 2022-11-04 NOTE — Addendum Note (Signed)
Encounter addended by: Satira Anis, RN on: 11/04/2022 12:47 PM  Actions taken: Charge Capture section accepted

## 2022-11-04 NOTE — Addendum Note (Signed)
Encounter addended by: Satira Anis, RN on: 11/04/2022 12:51 PM  Actions taken: Charge Capture section accepted

## 2022-11-09 ENCOUNTER — Ambulatory Visit (INDEPENDENT_AMBULATORY_CARE_PROVIDER_SITE_OTHER): Payer: Medicare Other

## 2022-11-09 VITALS — Ht 72.0 in | Wt 197.0 lb

## 2022-11-09 DIAGNOSIS — Z0000 Encounter for general adult medical examination without abnormal findings: Secondary | ICD-10-CM

## 2022-11-09 NOTE — Patient Instructions (Signed)
[name] , Thank you for taking time to come for your Medicare Wellness Visit. I appreciate your ongoing commitment to your health goals. Please review the following plan we discussed and let me know if I can assist you in the future.   These are the goals we discussed:  Goals       No current goal (pt-stated)      Patient Stated      Starting 10/09/2018, I will continue to take medications as prescribed.       Patient Stated      10/16/2019, I will maintain and continue medications as prescribed.       Patient Stated      11/09/2022, maintain current health        This is a list of the screening recommended for you and due dates:  Health Maintenance  Topic Date Due   DTaP/Tdap/Td vaccine (3 - Tdap) 09/19/2018   COVID-19 Vaccine (8 - 2023-24 season) 04/13/2022   Colon Cancer Screening  11/20/2022   Complete foot exam   12/01/2022   Eye exam for diabetics  12/01/2022   Hemoglobin A1C  12/06/2022   Flu Shot  12/09/2022   Yearly kidney health urinalysis for diabetes  06/08/2023   Yearly kidney function blood test for diabetes  08/17/2023   Medicare Annual Wellness Visit  11/09/2023   Pneumonia Vaccine  Completed   Hepatitis C Screening  Completed   Zoster (Shingles) Vaccine  Completed   HPV Vaccine  Aged Out    Advanced directives: Please bring a copy of your POA (Power of Armstrong) and/or Living Will to your next appointment.   Conditions/risks identified: none  Next appointment: Follow up in one year for your annual wellness visit.   Preventive Care 77 Years and Older, Male  Preventive care refers to lifestyle choices and visits with your health care provider that can promote health and wellness. What does preventive care include? A yearly physical exam. This is also called an annual well check. Dental exams once or twice a year. Routine eye exams. Ask your health care provider how often you should have your eyes checked. Personal lifestyle choices, including: Daily  care of your teeth and gums. Regular physical activity. Eating a healthy diet. Avoiding tobacco and drug use. Limiting alcohol use. Practicing safe sex. Taking low doses of aspirin every day. Taking vitamin and mineral supplements as recommended by your health care provider. What happens during an annual well check? The services and screenings done by your health care provider during your annual well check will depend on your age, overall health, lifestyle risk factors, and family history of disease. Counseling  Your health care provider may ask you questions about your: Alcohol use. Tobacco use. Drug use. Emotional well-being. Home and relationship well-being. Sexual activity. Eating habits. History of falls. Memory and ability to understand (cognition). Work and work Astronomer. Screening  You may have the following tests or measurements: Height, weight, and BMI. Blood pressure. Lipid and cholesterol levels. These may be checked every 5 years, or more frequently if you are over 83 years old. Skin check. Lung cancer screening. You may have this screening every year starting at age 14 if you have a 30-pack-year history of smoking and currently smoke or have quit within the past 15 years. Fecal occult blood test (FOBT) of the stool. You may have this test every year starting at age 81. Flexible sigmoidoscopy or colonoscopy. You may have a sigmoidoscopy every 5 years or a  colonoscopy every 10 years starting at age 28. Prostate cancer screening. Recommendations will vary depending on your family history and other risks. Hepatitis C blood test. Hepatitis B blood test. Sexually transmitted disease (STD) testing. Diabetes screening. This is done by checking your blood sugar (glucose) after you have not eaten for a while (fasting). You may have this done every 1-3 years. Abdominal aortic aneurysm (AAA) screening. You may need this if you are a current or former smoker. Osteoporosis.  You may be screened starting at age 19 if you are at high risk. Talk with your health care provider about your test results, treatment options, and if necessary, the need for more tests. Vaccines  Your health care provider may recommend certain vaccines, such as: Influenza vaccine. This is recommended every year. Tetanus, diphtheria, and acellular pertussis (Tdap, Td) vaccine. You may need a Td booster every 10 years. Zoster vaccine. You may need this after age 74. Pneumococcal 13-valent conjugate (PCV13) vaccine. One dose is recommended after age 45. Pneumococcal polysaccharide (PPSV23) vaccine. One dose is recommended after age 49. Talk to your health care provider about which screenings and vaccines you need and how often you need them. This information is not intended to replace advice given to you by your health care provider. Make sure you discuss any questions you have with your health care provider. Document Released: 05/23/2015 Document Revised: 01/14/2016 Document Reviewed: 02/25/2015 Elsevier Interactive Patient Education  2017 [employer].  Fall Prevention in the Home Falls can cause injuries. They can happen to people of all ages. There are many things you can do to make your home safe and to help prevent falls. What can I do on the outside of my home? Regularly fix the edges of walkways and driveways and fix any cracks. Remove anything that might make you trip as you walk through a door, such as a raised step or threshold. Trim any bushes or trees on the path to your home. Use bright outdoor lighting. Clear any walking paths of anything that might make someone trip, such as rocks or tools. Regularly check to see if handrails are loose or broken. Make sure that both sides of any steps have handrails. Any raised decks and porches should have guardrails on the edges. Have any leaves, snow, or ice cleared regularly. Use sand or salt on walking paths during winter. Clean up any  spills in your garage right away. This includes oil or grease spills. What can I do in the bathroom? Use night lights. Install grab bars by the toilet and in the tub and shower. Do not use towel bars as grab bars. Use non-skid mats or decals in the tub or shower. If you need to sit down in the shower, use a plastic, non-slip stool. Keep the floor dry. Clean up any water that spills on the floor as soon as it happens. Remove soap buildup in the tub or shower regularly. Attach bath mats securely with double-sided non-slip rug tape. Do not have throw rugs and other things on the floor that can make you trip. What can I do in the bedroom? Use night lights. Make sure that you have a light by your bed that is easy to reach. Do not use any sheets or blankets that are too big for your bed. They should not hang down onto the floor. Have a firm chair that has side arms. You can use this for support while you get dressed. Do not have throw rugs and other things  on the floor that can make you trip. What can I do in the kitchen? Clean up any spills right away. Avoid walking on wet floors. Keep items that you use a lot in easy-to-reach places. If you need to reach something above you, use a strong step stool that has a grab bar. Keep electrical cords out of the way. Do not use floor polish or wax that makes floors slippery. If you must use wax, use non-skid floor wax. Do not have throw rugs and other things on the floor that can make you trip. What can I do with my stairs? Do not leave any items on the stairs. Make sure that there are handrails on both sides of the stairs and use them. Fix handrails that are broken or loose. Make sure that handrails are as long as the stairways. Check any carpeting to make sure that it is firmly attached to the stairs. Fix any carpet that is loose or worn. Avoid having throw rugs at the top or bottom of the stairs. If you do have throw rugs, attach them to the floor  with carpet tape. Make sure that you have a light switch at the top of the stairs and the bottom of the stairs. If you do not have them, ask someone to add them for you. What else can I do to help prevent falls? Wear shoes that: Do not have high heels. Have rubber bottoms. Are comfortable and fit you well. Are closed at the toe. Do not wear sandals. If you use a stepladder: Make sure that it is fully opened. Do not climb a closed stepladder. Make sure that both sides of the stepladder are locked into place. Ask someone to hold it for you, if possible. Clearly mark and make sure that you can see: Any grab bars or handrails. First and last steps. Where the edge of each step is. Use tools that help you move around (mobility aids) if they are needed. These include: Canes. Walkers. Scooters. Crutches. Turn on the lights when you go into a dark area. Replace any light bulbs as soon as they burn out. Set up your furniture so you have a clear path. Avoid moving your furniture around. If any of your floors are uneven, fix them. If there are any pets around you, be aware of where they are. Review your medicines with your doctor. Some medicines can make you feel dizzy. This can increase your chance of falling. Ask your doctor what other things that you can do to help prevent falls. This information is not intended to replace advice given to you by your health care provider. Make sure you discuss any questions you have with your health care provider. Document Released: 02/20/2009 Document Revised: 10/02/2015 Document Reviewed: 05/31/2014 Elsevier Interactive Patient Education  2017 [employer].

## 2022-11-09 NOTE — Progress Notes (Signed)
Subjective:   Paul Fry is a 77 y.o. male who presents for Medicare Annual/Subsequent preventive examination.  Visit Complete: Virtual  I connected with  Paul Fry on 11/09/22 by a audio enabled telemedicine application and verified that I am speaking with the correct person using two identifiers.  Patient Location: Home  Provider Location: Office/Clinic  I discussed the limitations of evaluation and management by telemedicine. The patient expressed understanding and agreed to proceed.  Patient Medicare AWV questionnaire was completed by the patient on 11/08/2022; I have confirmed that all information answered by patient is correct and no changes since this date.  Review of Systems     Cardiac Risk Factors include: diabetes mellitus;advanced age (>70men, >66 women);hypertension;male gender     Objective:    Today's Vitals   11/09/22 0811  Weight: 197 lb (89.4 kg)  Height: 6' (1.829 m)   Body mass index is 26.72 kg/m.     11/09/2022    8:17 AM 09/16/2022    9:33 AM 01/08/2022    8:54 AM 11/19/2021    9:16 AM 11/04/2021    9:20 AM 08/20/2021   11:41 AM 07/09/2021    6:23 AM  Advanced Directives  Does Patient Have a Medical Advance Directive? Yes Yes Yes Yes Yes Yes Yes  Type of Estate agent of [place];Living will Living will;Healthcare Power of [employer] Power of [place];Living will  Healthcare Power of Force;Living will    Does patient want to make changes to medical advance directive?     No - Patient declined    Copy of Healthcare Power of Attorney in Chart? No - copy requested  Yes - validated most recent copy scanned in chart (See row information)  No - copy requested      Current Medications (verified) Outpatient Encounter Medications as of 11/09/2022  Medication Sig   ascorbic acid (VITAMIN C) 1000 MG tablet Take 1,000 mg by mouth in the morning.   atorvastatin (LIPITOR) 20 MG tablet TAKE 1 TABLET (20 MG TOTAL) BY MOUTH  EVERY MORNING.   gabapentin (NEURONTIN) 100 MG capsule Take 1-2 capsules (100-200 mg total) by mouth 3 (three) times daily as needed.   loperamide (IMODIUM A-D) 2 MG tablet Take 1 tablet (2 mg total) by mouth daily as needed for diarrhea or loose stools.   losartan (COZAAR) 25 MG tablet TAKE 1/2 TABLET EVERY MORNING   metoprolol succinate (TOPROL-XL) 50 MG 24 hr tablet TAKE 1 TABLET IN THE MORNING. TAKE 1/2 TABLET IN THE EVENING. TAKE WITH OR IMMEDIATELY FOLLOWING A MEAL.   Multiple Minerals-Vitamins (CALCIUM 600+D3 PLUS MINERALS) TABS Take 1 tablet by mouth in the morning.   Multiple Vitamin (MULTIVITAMIN WITH MINERALS) TABS tablet Take 1 tablet by mouth daily. Centrum   Omega-3 Fatty Acids (FISH OIL PO) Take 1 capsule by mouth in the morning.   tamsulosin (FLOMAX) 0.4 MG CAPS capsule TAKE 2 CAPSULES (0.8 MG TOTAL) BY MOUTH DAILY.   clopidogrel (PLAVIX) 75 MG tablet Take 1 tablet (75 mg total) by mouth daily. (Patient not taking: Reported on 11/09/2022)   No facility-administered encounter medications on file as of 11/09/2022.    Allergies (verified) Aspirin   History: Past Medical History:  Diagnosis Date   Allergy    SEASONAL   Anemia    Arthritis    KNEES   BPH (benign prostatic hyperplasia)    Difficult airway for intubation    Difficult airway for intubation 08/25/2021   Diverticulosis of colon  Elevated PSA    1.22 on 01-20-2016   Feeling of incomplete bladder emptying    GERD (gastroesophageal reflux disease)    Hiatal hernia    History of adenomatous polyp of colon    2014 tubular adenoma   History of chronic gastritis    History of Helicobacter pylori infection    07/ 2016   History of lower GI bleeding    07/ 2010 and 12/ 2011  diverticular bleed both times   Hyperlipidemia    Hypertension    Lower urinary tract symptoms (LUTS)    Nonischemic cardiomyopathy (HCC)    Plantar fascia syndrome    Presence of Watchman left atrial appendage closure device 07/09/2021    Watchman FLX 31mm with Dr. Lalla Brothers   Strains to urinate    INTERMITTANT   Type 2 diabetes, diet controlled (HCC)    ON BORDER LINE,NEVER TOOK MEDICATION.   Wears glasses    Past Surgical History:  Procedure Laterality Date   APPENDECTOMY  teenager   ATRIAL FIBRILLATION ABLATION N/A 05/15/2021   Procedure: ATRIAL FIBRILLATION ABLATION;  Surgeon: Lanier Prude, MD;  Location: [facility];  Service: Cardiovascular;  Laterality: N/A;   BIOPSY  11/19/2021   Procedure: BIOPSY;  Surgeon: Meryl Dare, MD;  Location: [facility];  Service: Gastroenterology;;   CARDIAC CATHETERIZATION  09-10-2008   dr dalton mclean   mild non-obstructive CAD;  30-40% mRCA, 20%pLAD,  ef 55%   CARDIOVASCULAR STRESS TEST  07/29/2011   Low risk nuclear study w/ no ischemia or infarct/  LVEF 47% and  apical hypokinesis (08-07-2012 cardiac MRI -- EF 49% w/ no hyperenhancement distal septal and apical hypokinesis, mild LAE, mild RVE, mild AR with mild dilated sinus 39mm)   CATARACT EXTRACTION W/ INTRAOCULAR LENS  IMPLANT, BILATERAL  2016   COLONOSCOPY  last one 12-21-2012   COLONOSCOPY WITH PROPOFOL N/A 11/19/2021   Procedure: COLONOSCOPY WITH PROPOFOL;  Surgeon: Meryl Dare, MD;  Location: [facility];  Service: Gastroenterology;  Laterality: N/A;   CYSTOSCOPY WITH INSERTION OF UROLIFT N/A 06/17/2016   Procedure: CYSTOSCOPY WITH INSERTION OF UROLIFT;  Surgeon: Jethro Bolus, MD;  Location: [facility];  Service: Urology;  Laterality: N/A;   ESOPHAGOGASTRODUODENOSCOPY  last one 11-22-2014   LEFT ATRIAL APPENDAGE OCCLUSION N/A 07/09/2021   Procedure: LEFT ATRIAL APPENDAGE OCCLUSION;  Surgeon: Lanier Prude, MD;  Location: [facility];  Service: Cardiovascular;  Laterality: N/A;   POLYPECTOMY  11/19/2021   Procedure: POLYPECTOMY;  Surgeon: Meryl Dare, MD;  Location: [facility];  Service: Gastroenterology;;   TEE WITHOUT CARDIOVERSION N/A 07/09/2021   Procedure:  TRANSESOPHAGEAL ECHOCARDIOGRAM (TEE);  Surgeon: Lanier Prude, MD;  Location: [facility];  Service: Cardiovascular;  Laterality: N/A;   TEE WITHOUT CARDIOVERSION N/A 08/20/2021   Procedure: TRANSESOPHAGEAL ECHOCARDIOGRAM (TEE);  Surgeon: Christell Constant, MD;  Location: [facility];  Service: Cardiovascular;  Laterality: N/A;   TEE WITHOUT CARDIOVERSION N/A 01/08/2022   Procedure: TRANSESOPHAGEAL ECHOCARDIOGRAM (TEE);  Surgeon: Wendall Stade, MD;  Location: [facility] ENDOSCOPY;  Service: Cardiovascular;  Laterality: N/A;   TRANSTHORACIC ECHOCARDIOGRAM  09/17/2014   ef 50-55%/  trivial AR, MR and PR/  mild LAE and RAE/  mild TR   Family History  Problem Relation Age of Onset   Colon cancer Mother 70   Heart disease Sister        MI, pace maker   Kidney disease Sister  renal failure, nephrectomy- not cancer   Hypertension Sister    Hypertension Sister    Colon polyps Brother    Lung cancer Brother    Diabetes Neg Hx        DM   Depression Neg Hx    Stroke Neg Hx    Alcohol abuse Neg Hx        also no drug abuse    Prostate cancer Neg Hx    Liver cancer Neg Hx    Rectal cancer Neg Hx    Stomach cancer Neg Hx    Social History   Socioeconomic History   Marital status: Married    Spouse name: Not on file   Number of children: 0   Years of education: Not on file   Highest education level: 12th grade  Occupational History   Occupation: truck Hospital doctor- retired    Associate Professor: RETIRED  Tobacco Use   Smoking status: Never   Smokeless tobacco: Never  Vaping Use   Vaping Use: Never used  Substance and Sexual Activity   Alcohol use: Not Currently    Comment: rarely drinks beer   Drug use: No   Sexual activity: Not Currently  Other Topics Concern   Not on file  Social History Narrative   Married 2003, no children.    Retired Naval architect, gets regular exercise- walking.    Step daughter is MD in [place]   Social Determinants of Health   Financial Resource  Strain: Low Risk  (11/08/2022)   Overall Financial Resource Strain (CARDIA)    Difficulty of Paying Living Expenses: Not very hard  Food Insecurity: No Food Insecurity (11/08/2022)   Hunger Vital Sign    Worried About Running Out of Food in the Last Year: Never true    Ran Out of Food in the Last Year: Never true  Transportation Needs: No Transportation Needs (11/08/2022)   PRAPARE - Administrator, Civil Service (Medical): No    Lack of Transportation (Non-Medical): No  Physical Activity: Insufficiently Active (11/08/2022)   Exercise Vital Sign    Days of Exercise per Week: 7 days    Minutes of Exercise per Session: 10 min  Stress: No Stress Concern Present (11/08/2022)   [employer] of Occupational Health - Occupational Stress Questionnaire    Feeling of Stress : Not at all  Social Connections: Socially Integrated (11/08/2022)   Social Connection and Isolation Panel [NHANES]    Frequency of Communication with Friends and Family: More than three times a week    Frequency of Social Gatherings with Friends and Family: Three times a week    Attends Religious Services: More than 4 times per year    Active Member of Clubs or Organizations: Yes    Attends Banker Meetings: 1 to 4 times per year    Marital Status: Married    Tobacco Counseling Counseling given: Not Answered   Clinical Intake:  Pre-visit preparation completed: Yes  Pain : No/denies pain     Nutritional Status: BMI 25 -29 Overweight Nutritional Risks: None Diabetes: Yes CBG done?: No Did pt. bring in CBG monitor from home?: No  How often do you need to have someone help you when you read instructions, pamphlets, or other written materials from your doctor or pharmacy?: 1 - Never  Interpreter Needed?: No  Information entered by :: NAllen LPN   Activities of Daily Living    11/08/2022    8:37 AM 09/16/2022    9:31  AM  In your present state of health, do you have any difficulty performing  the following activities:  Hearing? 0 0  Vision? 0 0  Difficulty concentrating or making decisions? 0 0  Walking or climbing stairs? 1 0  Comment due to knees   Dressing or bathing? 0 0  Doing errands, shopping? 0   Preparing Food and eating ? N   Using the Toilet? N   In the past six months, have you accidently leaked urine? N   Do you have problems with loss of bowel control? Y   Comment sometimes with dairy   Managing your Medications? N   Managing your Finances? N   Housekeeping or managing your Housekeeping? N     Patient Care Team: Joaquim Nam, MD as PCP - General (Family Medicine) Lanier Prude, MD as PCP - Electrophysiology (Cardiology) Jake Bathe, MD as PCP - Cardiology (Cardiology) Vilinda Flake, [facility] (Inactive) as Pharmacist (Pharmacist)  Indicate any recent Medical Services you may have received from other than [facility] providers in the past year (date may be approximate).     Assessment:   This is a routine wellness examination for [place].  Hearing/Vision screen Hearing Screening - Comments:: No hearing issues Vision Screening - Comments:: Regular eye exams, [place]  Dietary issues and exercise activities discussed:     Goals Addressed             This Visit's Progress    Patient Stated       11/09/2022, maintain current health       Depression Screen    11/09/2022    8:19 AM 09/17/2022    3:02 PM 08/17/2022    9:22 AM 06/07/2022    9:39 AM 11/04/2021    9:15 AM 03/27/2021    9:00 AM 04/21/2020   10:28 AM  PHQ 2/9 Scores  PHQ - 2 Score 0 0 0 0 0 0 0  PHQ- 9 Score 1 1  0       Fall Risk    11/08/2022    8:37 AM 09/17/2022    3:02 PM 06/07/2022    9:38 AM 11/04/2021    9:19 AM 11/03/2021    7:05 PM  Fall Risk   Falls in the past year? 0 0 0 0 0  Number falls in past yr: 0 0 0 0   Injury with Fall? 0 0 0 0 0  Risk for fall due to : Medication side effect No Fall Risks No Fall Risks No Fall Risks   Follow up Falls prevention  discussed;Education provided;Falls evaluation completed Falls evaluation completed Falls evaluation completed      MEDICARE RISK AT HOME:  Medicare Risk at Home - 11/08/22 0837     Grab bars in the bathroom? Yes             TIMED UP AND GO:  Was the test performed?  No    Cognitive Function:    10/16/2019   10:40 AM 10/09/2018    9:41 AM 07/22/2017   10:00 AM  MMSE - Mini Mental State Exam  Orientation to time 5 5 5   Orientation to Place 5 5 5   Registration 3 3 3   Attention/ Calculation 5 0 0  Recall 3 3 3   Language- name 2 objects  0 0  Language- repeat 1 1 1   Language- follow 3 step command  0 3  Language- read & follow direction  0 0  Write a  sentence  0 0  Copy design  0 0  Total score  17 20        11/09/2022    8:19 AM 11/04/2021    9:20 AM  6CIT Screen  What Year? 0 points 0 points  What month? 0 points 0 points  What time? 0 points 0 points  Count back from 20 0 points 0 points  Months in reverse 0 points 0 points  Repeat phrase 2 points 0 points  Total Score 2 points 0 points    Immunizations Immunization History  Administered Date(s) Administered   Covid-19, Mrna,Vaccine(Spikevax)55yrs and older 02/16/2022   Fluad Quad(high Dose 65+) 02/18/2020   Hepatitis A, Adult 11/07/2014   Influenza Split 05/25/2011   Influenza Whole 04/25/2010   Influenza, High Dose Seasonal PF 02/16/2022   Influenza, Seasonal, Injecte, Preservative Fre 05/25/2012   Influenza,inj,Quad PF,6+ Mos 02/26/2014, 02/05/2016, 01/31/2017, 02/23/2018   Influenza-Unspecified 03/07/2021   Moderna Covid-19 Vaccine Bivalent Booster 89yrs & up 11/06/2021   Moderna Sars-Covid-2 Vaccination 06/25/2019, 07/23/2019, 03/30/2020, 08/26/2020   Pfizer Covid-19 Vaccine Bivalent Booster 56yrs & up 02/12/2021   Pneumococcal Conjugate-13 07/16/2014   Pneumococcal Polysaccharide-23 04/25/2010, 07/17/2015   RSV,unspecified 06/18/2022   Td 05/11/1998, 09/18/2008   Zoster Recombinant(Shingrix)  10/08/2021, 02/16/2022   Zoster, Live 06/17/2011    TDAP status: Due, Education has been provided regarding the importance of this vaccine. Advised may receive this vaccine at local pharmacy or Health Dept. Aware to provide a copy of the vaccination record if obtained from local pharmacy or Health Dept. Verbalized acceptance and understanding.  Flu Vaccine status: Up to date  Pneumococcal vaccine status: Up to date  Covid-19 vaccine status: Completed vaccines  Qualifies for Shingles Vaccine? Yes   Zostavax completed Yes   Shingrix Completed?: Yes  Screening Tests Health Maintenance  Topic Date Due   DTaP/Tdap/Td (3 - Tdap) 09/19/2018   COVID-19 Vaccine (8 - 2023-24 season) 04/13/2022   Colonoscopy  11/20/2022   FOOT EXAM  12/01/2022   OPHTHALMOLOGY EXAM  12/01/2022   HEMOGLOBIN A1C  12/06/2022   INFLUENZA VACCINE  12/09/2022   Diabetic kidney evaluation - Urine ACR  06/08/2023   Diabetic kidney evaluation - eGFR measurement  08/17/2023   Medicare Annual Wellness (AWV)  11/09/2023   Pneumonia Vaccine 45+ Years old  Completed   Hepatitis C Screening  Completed   Zoster Vaccines- Shingrix  Completed   HPV VACCINES  Aged Out    Health Maintenance  Health Maintenance Due  Topic Date Due   DTaP/Tdap/Td (3 - Tdap) 09/19/2018   COVID-19 Vaccine (8 - 2023-24 season) 04/13/2022   Colonoscopy  11/20/2022    Colorectal cancer screening: No longer required.   Lung Cancer Screening: (Low Dose CT Chest recommended if Age 60-80 years, 20 pack-year currently smoking OR have quit w/in 15years.) does not qualify.   Lung Cancer Screening Referral: no  Additional Screening:  Hepatitis C Screening: does qualify; Completed 07/19/2016  Vision Screening: Recommended annual ophthalmology exams for early detection of glaucoma and other disorders of the eye. Is the patient up to date with their annual eye exam?  Yes  Who is the provider or what is the name of the office in which the  patient attends annual eye exams? [facility] If pt is not established with a provider, would they like to be referred to a provider to establish care? No .   Dental Screening: Recommended annual dental exams for proper oral hygiene  Diabetic Foot Exam: Diabetic Foot  Exam: Completed 11/30/2021  Community Resource Referral / Chronic Care Management: CRR required this visit?  No   CCM required this visit?  No     Plan:     I have personally reviewed and noted the following in the patient's chart:   Medical and social history Use of alcohol, tobacco or illicit drugs  Current medications and supplements including opioid prescriptions. Patient is not currently taking opioid prescriptions. Functional ability and status Nutritional status Physical activity Advanced directives List of other physicians Hospitalizations, surgeries, and ER visits in previous 12 months Vitals Screenings to include cognitive, depression, and falls Referrals and appointments  In addition, I have reviewed and discussed with patient certain preventive protocols, quality metrics, and best practice recommendations. A written personalized care plan for preventive services as well as general preventive health recommendations were provided to patient.     Barb Merino, LPN   12/09/9560   After Visit Summary: (MyChart) Due to this being a telephonic visit, the after visit summary with patients personalized plan was offered to patient via MyChart   Nurse Notes: none

## 2022-11-10 DIAGNOSIS — Z6827 Body mass index (BMI) 27.0-27.9, adult: Secondary | ICD-10-CM | POA: Diagnosis not present

## 2022-11-10 DIAGNOSIS — M5412 Radiculopathy, cervical region: Secondary | ICD-10-CM | POA: Diagnosis not present

## 2022-11-15 ENCOUNTER — Other Ambulatory Visit: Payer: Self-pay | Admitting: Family Medicine

## 2022-11-15 DIAGNOSIS — Z125 Encounter for screening for malignant neoplasm of prostate: Secondary | ICD-10-CM

## 2022-11-15 DIAGNOSIS — E119 Type 2 diabetes mellitus without complications: Secondary | ICD-10-CM

## 2022-11-17 ENCOUNTER — Ambulatory Visit: Payer: Medicare Other | Admitting: Cardiology

## 2022-11-22 ENCOUNTER — Ambulatory Visit: Payer: Medicare Other | Attending: Neurosurgery

## 2022-11-22 DIAGNOSIS — M5412 Radiculopathy, cervical region: Secondary | ICD-10-CM | POA: Diagnosis not present

## 2022-11-22 DIAGNOSIS — M542 Cervicalgia: Secondary | ICD-10-CM | POA: Diagnosis not present

## 2022-11-22 NOTE — Therapy (Signed)
[facility] [address] [place], [place], [address] Phone: [phone]   Fax:  [phone]  Physical Therapy Evaluation  Patient Details  Name: Paul Fry MRN: [id number] Date of Birth: Oct 13, 1945 Referring Provider (PT): Bedelia Person, MD   Encounter Date: 11/22/2022   PT End of Session - 11/22/22 1434     Visit Number 1    Number of Visits 17    Date for PT Re-Evaluation 01/21/23    PT Start Time 1435    PT Stop Time 1517    PT Time Calculation (min) 42 min    Activity Tolerance Patient tolerated treatment well    Behavior During Therapy [facility] for tasks assessed/performed             Past Medical History:  Diagnosis Date   Allergy    SEASONAL   Anemia    Arthritis    KNEES   BPH (benign prostatic hyperplasia)    Difficult airway for intubation    Difficult airway for intubation 08/25/2021   Diverticulosis of colon    Elevated PSA    1.22 on 01-20-2016   Feeling of incomplete bladder emptying    GERD (gastroesophageal reflux disease)    Hiatal hernia    History of adenomatous polyp of colon    2014 tubular adenoma   History of chronic gastritis    History of Helicobacter pylori infection    07/ 2016   History of lower GI bleeding    07/ 2010 and 12/ 2011  diverticular bleed both times   Hyperlipidemia    Hypertension    Lower urinary tract symptoms (LUTS)    Nonischemic cardiomyopathy (HCC)    Plantar fascia syndrome    Presence of Watchman left atrial appendage closure device 07/09/2021   Watchman FLX 31mm with Dr. Lalla Brothers   Strains to urinate    INTERMITTANT   Type 2 diabetes, diet controlled (HCC)    ON BORDER LINE,NEVER TOOK MEDICATION.   Wears glasses     Past Surgical History:  Procedure Laterality Date   APPENDECTOMY  teenager   ATRIAL FIBRILLATION ABLATION N/A 05/15/2021   Procedure: ATRIAL FIBRILLATION ABLATION;  Surgeon: Lanier Prude, MD;  Location: [facility] INVASIVE CV [facility];   Service: Cardiovascular;  Laterality: N/A;   BIOPSY  11/19/2021   Procedure: BIOPSY;  Surgeon: Meryl Dare, MD;  Location: [facility] ENDOSCOPY;  Service: Gastroenterology;;   CARDIAC CATHETERIZATION  09-10-2008   dr dalton mclean   mild non-obstructive CAD;  30-40% mRCA, 20%pLAD,  ef 55%   CARDIOVASCULAR STRESS TEST  07/29/2011   Low risk nuclear study w/ no ischemia or infarct/  LVEF 47% and  apical hypokinesis (08-07-2012 cardiac MRI -- EF 49% w/ no hyperenhancement distal septal and apical hypokinesis, mild LAE, mild RVE, mild AR with mild dilated sinus 39mm)   CATARACT EXTRACTION W/ INTRAOCULAR LENS  IMPLANT, BILATERAL  2016   COLONOSCOPY  last one 12-21-2012   COLONOSCOPY WITH PROPOFOL N/A 11/19/2021   Procedure: COLONOSCOPY WITH PROPOFOL;  Surgeon: Meryl Dare, MD;  Location: [facility];  Service: Gastroenterology;  Laterality: N/A;   CYSTOSCOPY WITH INSERTION OF UROLIFT N/A 06/17/2016   Procedure: CYSTOSCOPY WITH INSERTION OF UROLIFT;  Surgeon: Jethro Bolus, MD;  Location: [facility];  Service: Urology;  Laterality: N/A;   ESOPHAGOGASTRODUODENOSCOPY  last one 11-22-2014   LEFT ATRIAL APPENDAGE OCCLUSION N/A 07/09/2021   Procedure: LEFT ATRIAL APPENDAGE OCCLUSION;  Surgeon: Lanier Prude,  MD;  Location: [facility] CV [facility];  Service: Cardiovascular;  Laterality: N/A;   POLYPECTOMY  11/19/2021   Procedure: POLYPECTOMY;  Surgeon: Meryl Dare, MD;  Location: [facility];  Service: Gastroenterology;;   TEE WITHOUT CARDIOVERSION N/A 07/09/2021   Procedure: TRANSESOPHAGEAL ECHOCARDIOGRAM (TEE);  Surgeon: Lanier Prude, MD;  Location: [facility] INVASIVE CV LAB;  Service: Cardiovascular;  Laterality: N/A;   TEE WITHOUT CARDIOVERSION N/A 08/20/2021   Procedure: TRANSESOPHAGEAL ECHOCARDIOGRAM (TEE);  Surgeon: Christell Constant, MD;  Location: [facility] ENDOSCOPY;  Service: Cardiovascular;  Laterality: N/A;   TEE WITHOUT CARDIOVERSION N/A 01/08/2022   Procedure:  TRANSESOPHAGEAL ECHOCARDIOGRAM (TEE);  Surgeon: Wendall Stade, MD;  Location: [facility] ENDOSCOPY;  Service: Cardiovascular;  Laterality: N/A;   TRANSTHORACIC ECHOCARDIOGRAM  09/17/2014   ef 50-55%/  trivial AR, MR and PR/  mild LAE and RAE/  mild TR    There were no vitals filed for this visit.    Subjective Assessment - 11/22/22 1438     Subjective Neck (R lateral neck and upper trap area/shoulder): 5/10 currently, 9/10 at worst for the past 3 months.    Pertinent History Neck pain, mostly in R shoulder. Pain goes to R lateral neck. Pain started a couple of months ago, suddenly, no known mechanism of injury. Pt is R hand dominant. Has not yet had PT for current condition. Also has R UE symptoms along the R C5/C6 dermatome. Had an X-ray for neck and R shoulder. Had an MRI for R shoulder wich did not reveal anything. Pain is the same as onset a few months ago.    Patient Stated Goals Less pain with reaching.    Currently in Pain? Yes    Pain Score 5     Pain Location Neck    Pain Orientation Right;Lateral    Pain Descriptors / Indicators Sharp;Tingling    Pain Type Acute pain    Pain Onset More than a month ago    Pain Frequency Constant    Aggravating Factors  R lateral neck/upper trap area: reaching to the side, reaching foreward, reaching up.    Pain Relieving Factors rest, let it work itself out.                [facility] PT Assessment - 11/22/22 1436       Assessment   Medical Diagnosis Cervical Radiculopathy, M54.12    Referring Provider (PT) Bedelia Person, MD    Onset Date/Surgical Date 11/10/22    Prior Therapy None for this condition      Precautions   Precaution Comments no known precuations      Restrictions   Other Position/Activity Restrictions no known restrictions      Balance Screen   Has the patient fallen in the past 6 months No    Has the patient had a decrease in activity level because of a fear of falling?  No    Is the patient reluctant to leave their  home because of a fear of falling?  No      Posture/Postural Control   Posture Comments R cervical side bend around C5 area. Movement crease around C5 area, R shoulder lower, B protracted shoulders,      AROM   Right Shoulder Flexion 134 Degrees   with R lateral neck and upper trap tingling; 145 AAROM, stiff end feel   Right Shoulder ABduction 152 Degrees   witih R lateral neck tingling   Cervical Flexion WFL    Cervical Extension limited  with R lateral neck tingling reproduction    Cervical - Right [facility] with R lateral neck soreness around C5    Cervical - Left Side Bend limited with R lateral neck soreness around C5    Cervical - Right Rotation 62 degrees; limited with R lateral neck and upper trap area tingling   60 degrees supine   Cervical - Left Rotation 55 degrees; limited with R lateral neck soreness   60 degrees supine     Strength   Overall Strength Comments Manually resisted lower trap 3+/5 R and L    Right Shoulder Flexion 4+/5    Right Shoulder ABduction 5/5    Left Shoulder Flexion 5/5    Left Shoulder ABduction 4+/5    Right Elbow Flexion 4/5    Right Elbow Extension 5/5    Left Elbow Flexion 4/5    Left Elbow Extension 5/5    Right Wrist Extension 4+/5    Left Wrist Extension 4+/5      Palpation   Palpation comment B cervical paraspinal muscle tension L > R, B upper trap and scalene muscle tension      Special Tests   Other special tests ( - ) median nerve tension; (+) R radial nerve tension with reproduction of R lateral neck symptoms.                        Objective measurements completed on examination: See above findings.   Blood pressure is controlled per pt.  No latex allergies  B cervical paraspinal muscle tension L > R, B upper trap and scalene muscle tension  ( - ) median nerve tension; (+) R radial nerve tension with reproduction of R lateral neck symptoms.     Therapeutic exercise  Supine cervical nod 10x5 seconds for  3 sets  Seated B scapular retraction 10x5 seconds Cues to activate lower trap muscles   Decreased R lateral neck and upper trap pain  Reviewed HEP. Pt demonstrated and verbalized understanding. Handout provided.   Improved exercise technique, movement at target joints, use of target muscles after mod verbal, visual, tactile cues.    Response to treatment Decreased R lateral neck and upper trap pain after activation of lower trap muscles   Clinical impression Pt is a 77 year old male who came to physical therapy secondary to neck pain with R UE radiating symptoms. He also presents with altered posture, R UE neural tension, reproduction of symptoms with cervical extension, R and L side bending, and R and L cervical rotation, as well as with R shoulder flexion and abduction; increased B cervical paraspinal, upper trap and scalene muscle tension, altered cervical mechanics, decrease anterior cervical strength, and difficulty performing tasks which involve reaching with his R UE as well as looking around secondary to neck and R UE pain. Pt will benefit from skilled physical therapy services to address the aforementioned deficits.           Access Code: [email or web address] URL: [email or web address] Date: 11/22/2022 Prepared by: Loralyn Freshwater  Exercises - Supine Deep Neck Flexor Nods  - 1 x daily - 7 x weekly - 3 sets - 10 reps - 5 seconds hold - Seated Scapular Retraction  - 1 x daily - 7 x weekly - 3 sets - 10 reps - 5 seconds hold               PT Education - 11/22/22 1629     Education Details  Ther-ex, HEP, POC    Person(s) Educated Patient    Methods Explanation;Demonstration;Tactile cues;Verbal cues;Handout    Comprehension Returned demonstration;Verbalized understanding              PT Short Term Goals - 11/22/22 1622       PT SHORT TERM GOAL #1   Title Pt will be independent with his initial HEP to decrease pain, improve strength, function, and  ability to reach, as well as look around more comfortably.    Baseline Pt has started his initial HEP (11/22/2022)    Time 3    Period Weeks    Status New    Target Date 12/17/22               PT Long Term Goals - 11/22/22 1623       PT LONG TERM GOAL #1   Title Pt will have a decrease in neck and R UE pain to 3/10 or less at worst to promote ability to reach as well as look around more comfortably.    Baseline 9/10 R lateral neck and upper trap area pain at worst for the past 3 months (11/22/2022)    Time 8    Period Weeks    Status New    Target Date 01/21/23      PT LONG TERM GOAL #2   Title Pt will improve B lower trap strength by at least 1/2 MMT grade to help decrease upper trap and scalene muscle tension, improve ability to look around as well as reach with his R arm more comfortably.    Baseline Seated manually resisted scapular retraction targeting the lower trap 3+/5 R and L (11/22/2022)    Time 8    Period Weeks    Status New    Target Date 01/21/23      PT LONG TERM GOAL #3   Title Pt will improve his neck FOTO score by at least 10 points as a demonstration of improved function.    Baseline Neck FOTO 67 (11/22/2022)    Time 8    Period Weeks    Status New    Target Date 01/21/23                    Plan - 11/22/22 1631     Clinical Impression Statement Pt is a 77 year old male who came to physical therapy secondary to neck pain with R UE radiating symptoms. He also presents with altered posture, R UE neural tension, reproduction of symptoms with cervical extension, R and L side bending, and R and L cervical rotation, as well as with R shoulder flexion and abduction; increased B cervical paraspinal, upper trap and scalene muscle tension, altered cervical mechanics, decrease anterior cervical strength, and difficulty performing tasks which involve reaching with his R UE as well as looking around secondary to neck and R UE pain. Pt will benefit from skilled  physical therapy services to address the aforementioned deficits.    Personal Factors and Comorbidities Comorbidity 3+;Age    Comorbidities Anemia, BPH, GERD, HTN, DM    Examination-Activity Limitations Dressing;Lift;Reach Overhead;Other   looking around   Stability/Clinical Decision Making Stable/Uncomplicated    Clinical Decision Making Low    Rehab Potential Fair    PT Frequency 2x / week    PT Duration 8 weeks    PT Treatment/Interventions Therapeutic activities;Therapeutic exercise;Neuromuscular re-education;Patient/family education;Manual techniques;Dry needling;Electrical Stimulation;Iontophoresis 4mg /ml Dexamethasone;Traction   traction if appropriate   PT Next Visit Plan  posture, lower trap and anterior cervical muscle strengthening, decreasing cervical paraspinal, upper trap and scalene muscle tension, manual techniques, modalities PRN    PT Home Exercise Plan Medbridge Access Code: [email or web address]    Consulted and Agree with Plan of Care Patient             Patient will benefit from skilled therapeutic intervention in order to improve the following deficits and impairments:  Pain, Postural dysfunction, Improper body mechanics, Impaired UE functional use, Decreased strength, Decreased range of motion  Visit Diagnosis: Cervicalgia - Plan: PT plan of care cert/re-cert  Radiculopathy, cervical region - Plan: PT plan of care cert/re-cert     Problem List Patient Active Problem List   Diagnosis Date Noted   Neck pain 09/19/2022   Loose stools 01/13/2022   Family history of colon cancer    Hx of adenomatous colonic polyps    Benign neoplasm of cecum    Benign neoplasm of transverse colon    Difficult airway for intubation 08/25/2021   Anemia 08/02/2021   Presence of Watchman left atrial appendage closure device 07/09/2021   Secondary hypercoagulable state (HCC) 06/12/2021   H. pylori infection 03/27/2021   Atrial fibrillation (HCC) 12/04/2020   Stool incontinence 04/23/2020    Caregiver stress 10/24/2019   Pelvic floor dysfunction 10/24/2019   Skin tag 10/18/2018   Healthcare maintenance 10/15/2018   Plantar fasciitis 10/15/2018   Diabetes mellitus without complication (HCC) 07/30/2018   BPH (benign prostatic hyperplasia) 12/23/2016   Lower GI bleed 12/22/2016   Advance care planning 07/16/2014   Hyperglycemia 07/16/2014   Skin lesion 02/27/2014   HTN (hypertension) 07/09/2013   Paresthesia 12/25/2012   NICM (nonischemic cardiomyopathy) (HCC) 09/26/2012   ED (erectile dysfunction) 05/26/2012   DOE (dyspnea on exertion) 06/18/2011   Medicare annual wellness visit, subsequent 05/26/2011   Hypercholesteremia 03/03/2011   CAD in native artery 10/14/2008   Gastroesophageal reflux disease 10/14/2008   DIVERTICULOSIS OF COLON 04/03/2008   Loralyn Freshwater PT, DPT  11/22/2022, 4:43 PM  [facility] [address], [place], [address] Phone: [phone]   Fax:  [phone]  Name: CARDALE DORER MRN: [id number] Date of Birth: Apr 21, 1946

## 2022-11-24 ENCOUNTER — Ambulatory Visit: Payer: Medicare Other

## 2022-11-24 DIAGNOSIS — M542 Cervicalgia: Secondary | ICD-10-CM | POA: Diagnosis not present

## 2022-11-24 DIAGNOSIS — M5412 Radiculopathy, cervical region: Secondary | ICD-10-CM | POA: Diagnosis not present

## 2022-11-24 NOTE — Therapy (Signed)
[facility] [address] [place], [place], [address] Phone: [phone]   Fax:  [phone]  Physical Therapy Treatment  Patient Details  Name: Paul Fry MRN: [id number] Date of Birth: [birth year] Referring Provider (PT): Bedelia Person, MD   Encounter Date: 11/24/2022   PT End of Session - 11/24/22 1035     Visit Number 2    Number of Visits 17    Date for PT Re-Evaluation 01/21/23    PT Start Time 1035    PT Stop Time 1115    PT Time Calculation (min) 40 min    Activity Tolerance Patient tolerated treatment well    Behavior During Therapy [facility] for tasks assessed/performed              Past Medical History:  Diagnosis Date   Allergy    SEASONAL   Anemia    Arthritis    KNEES   BPH (benign prostatic hyperplasia)    Difficult airway for intubation    Difficult airway for intubation 08/25/2021   Diverticulosis of colon    Elevated PSA    1.22 on 01-20-2016   Feeling of incomplete bladder emptying    GERD (gastroesophageal reflux disease)    Hiatal hernia    History of adenomatous polyp of colon    2014 tubular adenoma   History of chronic gastritis    History of Helicobacter pylori infection    07/ 2016   History of lower GI bleeding    07/ 2010 and 12/ 2011  diverticular bleed both times   Hyperlipidemia    Hypertension    Lower urinary tract symptoms (LUTS)    Nonischemic cardiomyopathy (HCC)    Plantar fascia syndrome    Presence of Watchman left atrial appendage closure device 07/09/2021   Watchman FLX 31mm with Dr. Lalla Brothers   Strains to urinate    INTERMITTANT   Type 2 diabetes, diet controlled (HCC)    ON BORDER LINE,NEVER TOOK MEDICATION.   Wears glasses     Past Surgical History:  Procedure Laterality Date   APPENDECTOMY  teenager   ATRIAL FIBRILLATION ABLATION N/A 05/15/2021   Procedure: ATRIAL FIBRILLATION ABLATION;  Surgeon: Lanier Prude, MD;  Location: [facility] INVASIVE CV  LAB;  Service: Cardiovascular;  Laterality: N/A;   BIOPSY  11/19/2021   Procedure: BIOPSY;  Surgeon: Meryl Dare, MD;  Location: [facility] ENDOSCOPY;  Service: Gastroenterology;;   CARDIAC CATHETERIZATION  09-10-2008   dr dalton mclean   mild non-obstructive CAD;  30-40% mRCA, 20%pLAD,  ef 55%   CARDIOVASCULAR STRESS TEST  07/29/2011   Low risk nuclear study w/ no ischemia or infarct/  LVEF 47% and  apical hypokinesis (08-07-2012 cardiac MRI -- EF 49% w/ no hyperenhancement distal septal and apical hypokinesis, mild LAE, mild RVE, mild AR with mild dilated sinus 39mm)   CATARACT EXTRACTION W/ INTRAOCULAR LENS  IMPLANT, BILATERAL  2016   COLONOSCOPY  last one 12-21-2012   COLONOSCOPY WITH PROPOFOL N/A 11/19/2021   Procedure: COLONOSCOPY WITH PROPOFOL;  Surgeon: Meryl Dare, MD;  Location: [facility];  Service: Gastroenterology;  Laterality: N/A;   CYSTOSCOPY WITH INSERTION OF UROLIFT N/A 06/17/2016   Procedure: CYSTOSCOPY WITH INSERTION OF UROLIFT;  Surgeon: Jethro Bolus, MD;  Location: [facility] [place];  Service: Urology;  Laterality: N/A;   ESOPHAGOGASTRODUODENOSCOPY  last one 11-22-2014   LEFT ATRIAL APPENDAGE OCCLUSION N/A 07/09/2021   Procedure: LEFT ATRIAL APPENDAGE OCCLUSION;  Surgeon: Steffanie Dunn  T, MD;  Location: [facility] INVASIVE CV LAB;  Service: Cardiovascular;  Laterality: N/A;   POLYPECTOMY  11/19/2021   Procedure: POLYPECTOMY;  Surgeon: Meryl Dare, MD;  Location: [facility] ENDOSCOPY;  Service: Gastroenterology;;   TEE WITHOUT CARDIOVERSION N/A 07/09/2021   Procedure: TRANSESOPHAGEAL ECHOCARDIOGRAM (TEE);  Surgeon: Lanier Prude, MD;  [facility] INVASIVE CV LAB;  Service: Cardiovascular;  Laterality: N/A;   TEE WITHOUT CARDIOVERSION N/A 08/20/2021   Procedure: TRANSESOPHAGEAL ECHOCARDIOGRAM (TEE);  Surgeon: Christell Constant, MD;  Location: [facility] ENDOSCOPY;  Service: Cardiovascular;  Laterality: N/A;   TEE WITHOUT CARDIOVERSION N/A 01/08/2022   Procedure:  TRANSESOPHAGEAL ECHOCARDIOGRAM (TEE);  Surgeon: Wendall Stade, MD;  Location: [facility] ENDOSCOPY;  Service: Cardiovascular;  Laterality: N/A;   TRANSTHORACIC ECHOCARDIOGRAM  09/17/2014   ef 50-55%/  trivial AR, MR and PR/  mild LAE and RAE/  mild TR    There were no vitals filed for this visit.                          PCP: Joaquim Nam, MD   REFERRING PROVIDER: Bedelia Person, MD   REFERRING DIAG: Cervical Radiculopathy, M54.12   THERAPY DIAG:  Cervicalgia  Radiculopathy, cervical region  Rationale for Evaluation and Treatment: Rehabilitation  ONSET DATE: 11/10/22 (date PT referral signed)    Rationale for Evaluation and Treatment Rehabilitation  PERTINENT HISTORY: Neck pain, mostly in R shoulder. Pain goes to R lateral neck. Pain started a couple of months ago, suddenly, no known mechanism of injury. Pt is R hand dominant. Has not yet had PT for current condition. Also has R UE symptoms along the R C5/C6 dermatome. Had an X-ray for neck and R shoulder. Had an MRI for R shoulder wich did not reveal anything. Pain is the same as onset a few months ago.   PRECAUTIONS: no known precuations     SUBJECTIVE:   SUBJECTIVE STATEMENT: No pain currently.       PAIN:  Are you having pain? See subjective   TODAY'S TREATMENT:                                                                                                                                         DATE: 11/24/2022     Blood pressure is controlled per pt.  No latex allergies  B cervical paraspinal muscle tension L > R, B upper trap and scalene muscle tension  ( - ) median nerve tension; (+) R radial nerve tension with reproduction of R lateral neck symptoms.     Therapeutic exercise  Supine B scapular retraction 10x3 with 5 second holds  Supine cervical nod with B scapular retraction 10x5 seconds for 3 sets  Supine chin tucks 10x5 seconds for 3 sets   Supine B shoulder  flexion to promote thoracic extension and decrease stress to lower cervical spine 10x5  seconds  for 2 sets   Seated manually resisted scapular retraction targeting the lower trap muscle   R 10x5 seconds for 2 sets   R lateral neck tingling  Seated R scapular depression isometrics 10x5 seconds   Standing R pectoralis stretch at doorway  30 seconds x 3   Improved exercise technique, movement at target joints, use of target muscles after mod to max verbal, visual, tactile cues.    Response to treatment Pt tolerated session well without aggravation of symptoms.    Clinical impression Max cues needed to decrease R shoulder shrug/upper trap/scalene muscle compensation, and R scapular protraction and anterior tipping (increased pect minor activation) especially when upright. Added R pectoralis muscle stretch to promote better scapular mechanics. Pt tolerated session well without aggravation of symptoms. Pt will benefit from continued skilled physical therapy services to decrease pain, improve strength and function.        PATIENT EDUCATION: Education details: there-ex, HEP Person educated: Patient Education method: Explanation, Demonstration, Tactile cues, and Verbal cues, handout Education comprehension: verbalized understanding and returned demonstration  HOME EXERCISE PROGRAM:  Access Code: [email or web address] URL: [email or web address] Date: 11/22/2022 Prepared by: Loralyn Freshwater  Exercises - Supine Deep Neck Flexor Nods  - 1 x daily - 7 x weekly - 3 sets - 10 reps - 5 seconds hold - Seated Scapular Retraction  - 1 x daily - 7 x weekly - 3 sets - 10 reps - 5 seconds hold                              PT Short Term Goals - 11/22/22 1622       PT SHORT TERM GOAL #1   Title Pt will be independent with his initial HEP to decrease pain, improve strength, function, and ability to reach, as well as look around more comfortably.    Baseline Pt has  started his initial HEP (11/22/2022)    Time 3    Period Weeks    Status New    Target Date 12/17/22               PT Long Term Goals - 11/22/22 1623       PT LONG TERM GOAL #1   Title Pt will have a decrease in neck and R UE pain to 3/10 or less at worst to promote ability to reach as well as look around more comfortably.    Baseline 9/10 R lateral neck and upper trap area pain at worst for the past 3 months (11/22/2022)    Time 8    Period Weeks    Status New    Target Date 01/21/23      PT LONG TERM GOAL #2   Title Pt will improve B lower trap strength by at least 1/2 MMT grade to help decrease upper trap and scalene muscle tension, improve ability to look around as well as reach with his R arm more comfortably.    Baseline Seated manually resisted scapular retraction targeting the lower trap 3+/5 R and L (11/22/2022)    Time 8    Period Weeks    Status New    Target Date 01/21/23      PT LONG TERM GOAL #3   Title Pt will improve his neck FOTO score by at least 10 points as a demonstration of improved function.    Baseline Neck FOTO 67 (11/22/2022)    Time 8  Period Weeks    Status New    Target Date 01/21/23                    Plan - 11/24/22 1033     Clinical Impression Statement Max cues needed to decrease R shoulder shrug/upper trap/scalene muscle compensation, and R scapular protraction and anterior tipping (increased pect minor activation) especially when upright. Added R pectoralis muscle stretch to promote better scapular mechanics. Pt tolerated session well without aggravation of symptoms. Pt will benefit from continued skilled physical therapy services to decrease pain, improve strength and function.    Personal Factors and Comorbidities Comorbidity 3+;Age    Comorbidities Anemia, BPH, GERD, HTN, DM    Examination-Activity Limitations Dressing;Lift;Reach Overhead;Other   looking around   Stability/Clinical Decision Making Stable/Uncomplicated     Rehab Potential Fair    PT Frequency 2x / week    PT Duration 8 weeks    PT Treatment/Interventions Therapeutic activities;Therapeutic exercise;Neuromuscular re-education;Patient/family education;Manual techniques;Dry needling;Electrical Stimulation;Iontophoresis 4mg /ml Dexamethasone;Traction   traction if appropriate   PT Next Visit Plan posture, lower trap and anterior cervical muscle strengthening, decreasing cervical paraspinal, upper trap and scalene muscle tension, manual techniques, modalities PRN    PT Home Exercise Plan Medbridge Access Code: [email or web address]    Consulted and Agree with Plan of Care Patient             Patient will benefit from skilled therapeutic intervention in order to improve the following deficits and impairments:  Pain, Postural dysfunction, Improper body mechanics, Impaired UE functional use, Decreased strength, Decreased range of motion  Visit Diagnosis: Cervicalgia  Radiculopathy, cervical region     Problem List Patient Active Problem List   Diagnosis Date Noted   Neck pain 09/19/2022   Loose stools 01/13/2022   Family history of colon cancer    Hx of adenomatous colonic polyps    Benign neoplasm of cecum    Benign neoplasm of transverse colon    Difficult airway for intubation 08/25/2021   Anemia 08/02/2021   Presence of Watchman left atrial appendage closure device 07/09/2021   Secondary hypercoagulable state (HCC) 06/12/2021   H. pylori infection 03/27/2021   Atrial fibrillation (HCC) 12/04/2020   Stool incontinence 04/23/2020   Caregiver stress 10/24/2019   Pelvic floor dysfunction 10/24/2019   Skin tag 10/18/2018   Healthcare maintenance 10/15/2018   Plantar fasciitis 10/15/2018   Diabetes mellitus without complication (HCC) 07/30/2018   BPH (benign prostatic hyperplasia) 12/23/2016   Lower GI bleed 12/22/2016   Advance care planning 07/16/2014   Hyperglycemia 07/16/2014   Skin lesion 02/27/2014   HTN (hypertension) 07/09/2013    Paresthesia 12/25/2012   NICM (nonischemic cardiomyopathy) (HCC) 09/26/2012   ED (erectile dysfunction) 05/26/2012   DOE (dyspnea on exertion) 06/18/2011   Medicare annual wellness visit, subsequent 05/26/2011   Hypercholesteremia 03/03/2011   CAD in native artery 10/14/2008   Gastroesophageal reflux disease 10/14/2008   DIVERTICULOSIS OF COLON 04/03/2008   Loralyn Freshwater PT, DPT  11/24/2022, 1:35 PM  [facility] [address] [place], [address] Phone: [phone]   Fax:  [phone]  Name: Paul Fry MRN: [id number] Date of Birth: 1945/07/04

## 2022-11-25 ENCOUNTER — Telehealth: Payer: Self-pay | Admitting: *Deleted

## 2022-11-25 NOTE — Telephone Encounter (Signed)
Received call from pt requesting medical records from Dr. Derek [facility] clinic be sent to Dr. Loretha Stapler @ [facility] clinic for a 2nd opinion  These records were fax'd today. Fax receipt received

## 2022-11-26 ENCOUNTER — Other Ambulatory Visit (INDEPENDENT_AMBULATORY_CARE_PROVIDER_SITE_OTHER): Payer: Medicare Other

## 2022-11-26 DIAGNOSIS — N400 Enlarged prostate without lower urinary tract symptoms: Secondary | ICD-10-CM

## 2022-11-26 DIAGNOSIS — E119 Type 2 diabetes mellitus without complications: Secondary | ICD-10-CM

## 2022-11-26 DIAGNOSIS — Z125 Encounter for screening for malignant neoplasm of prostate: Secondary | ICD-10-CM | POA: Diagnosis not present

## 2022-11-26 NOTE — Addendum Note (Signed)
Addended by: Alvina Chou on: 11/26/2022 08:35 AM   Modules accepted: Orders

## 2022-11-27 LAB — CBC WITH DIFFERENTIAL/PLATELET
Absolute Monocytes: 279 cells/uL (ref 200–950)
Basophils Relative: 0.7 %
MCHC: 32 g/dL (ref 32.0–36.0)
MCV: 79.2 fL — ABNORMAL LOW (ref 80.0–100.0)
Monocytes Relative: 6.2 %
Neutrophils Relative %: 63.1 %
Platelets: 263 10*3/uL (ref 140–400)
RDW: 22.9 % — ABNORMAL HIGH (ref 11.0–15.0)
Total Lymphocyte: 29.8 %

## 2022-11-27 LAB — LIPID PANEL
HDL: 25 mg/dL — ABNORMAL LOW (ref >=40)
LDL Cholesterol (Calc): 65 mg/dL (calc)
Non-HDL Cholesterol (Calc): 86 mg/dL (calc) (ref <130)
Triglycerides: 129 mg/dL (ref <150)

## 2022-11-27 LAB — COMPREHENSIVE METABOLIC PANEL
AG Ratio: 1.4 (calc) (ref 1.0–2.5)
ALT: 24 U/L (ref 9–46)
Alkaline phosphatase (APISO): 59 U/L (ref 35–144)
BUN: 13 mg/dL (ref 7–25)
CO2: 24 mmol/L (ref 20–32)
Chloride: 104 mmol/L (ref 98–110)
Globulin: 3 g/dL (calc) (ref 1.9–3.7)
Glucose, Bld: 139 mg/dL — ABNORMAL HIGH (ref 65–99)
Potassium: 4.4 mmol/L (ref 3.5–5.3)
Total Bilirubin: 1 mg/dL (ref 0.2–1.2)

## 2022-11-27 LAB — PSA: PSA: 0.96 ng/mL (ref <=4.00)

## 2022-11-27 LAB — HEMOGLOBIN A1C
Mean Plasma Glucose: 143 mg/dL
eAG (mmol/L): 7.9 mmol/L

## 2022-11-27 LAB — MICROALBUMIN / CREATININE URINE RATIO: Microalb Creat Ratio: 2 mg/g creat (ref <30)

## 2022-11-28 ENCOUNTER — Other Ambulatory Visit: Payer: Self-pay | Admitting: Hematology and Oncology

## 2022-11-28 DIAGNOSIS — D7581 Myelofibrosis: Secondary | ICD-10-CM

## 2022-11-29 ENCOUNTER — Other Ambulatory Visit: Payer: Self-pay

## 2022-11-29 ENCOUNTER — Inpatient Hospital Stay: Payer: Medicare Other | Attending: Physician Assistant

## 2022-11-29 ENCOUNTER — Ambulatory Visit: Payer: Medicare Other

## 2022-11-29 DIAGNOSIS — D649 Anemia, unspecified: Secondary | ICD-10-CM | POA: Diagnosis not present

## 2022-11-29 DIAGNOSIS — D7581 Myelofibrosis: Secondary | ICD-10-CM

## 2022-11-29 DIAGNOSIS — M5412 Radiculopathy, cervical region: Secondary | ICD-10-CM

## 2022-11-29 DIAGNOSIS — M542 Cervicalgia: Secondary | ICD-10-CM | POA: Diagnosis not present

## 2022-11-29 LAB — CBC WITH DIFFERENTIAL/PLATELET
Basophils Absolute: 32 cells/uL (ref 0–200)
Eosinophils Absolute: 9 cells/uL — ABNORMAL LOW (ref 15–500)
Eosinophils Relative: 0.2 %
HCT: 31.9 % — ABNORMAL LOW (ref 38.5–50.0)
Hemoglobin: 10.2 g/dL — ABNORMAL LOW (ref 13.2–17.1)
Lymphs Abs: 1341 cells/uL (ref 850–3900)
MCH: 25.3 pg — ABNORMAL LOW (ref 27.0–33.0)
Neutro Abs: 2840 cells/uL (ref 1500–7800)
RBC: 4.03 10*6/uL — ABNORMAL LOW (ref 4.20–5.80)
WBC: 4.5 10*3/uL (ref 3.8–10.8)

## 2022-11-29 LAB — FRUCTOSAMINE: Fructosamine: 283 umol/L (ref 205–285)

## 2022-11-29 LAB — CMP (CANCER CENTER ONLY)
ALT: 21 U/L (ref 0–44)
AST: 23 U/L (ref 15–41)
Albumin: 4.1 g/dL (ref 3.5–5.0)
Alkaline Phosphatase: 59 U/L (ref 38–126)
Anion gap: 7 (ref 5–15)
BUN: 12 mg/dL (ref 8–23)
CO2: 26 mmol/L (ref 22–32)
Calcium: 9.1 mg/dL (ref 8.9–10.3)
Chloride: 105 mmol/L (ref 98–111)
Creatinine: 0.84 mg/dL (ref 0.61–1.24)
GFR, Estimated: >60 mL/min (ref >60)
Glucose, Bld: 130 mg/dL — ABNORMAL HIGH (ref 70–99)
Potassium: 4.2 mmol/L (ref 3.5–5.1)
Sodium: 138 mmol/L (ref 135–145)
Total Bilirubin: 0.9 mg/dL (ref 0.3–1.2)
Total Protein: 7.3 g/dL (ref 6.5–8.1)

## 2022-11-29 LAB — CBC WITH DIFFERENTIAL (CANCER CENTER ONLY)
Abs Immature Granulocytes: 0.05 10*3/uL (ref 0.00–0.07)
Basophils Absolute: 0 10*3/uL (ref 0.0–0.1)
Basophils Relative: 1 %
Eosinophils Absolute: 0 10*3/uL (ref 0.0–0.5)
Eosinophils Relative: 0 %
HCT: 32.4 % — ABNORMAL LOW (ref 39.0–52.0)
Hemoglobin: 10.3 g/dL — ABNORMAL LOW (ref 13.0–17.0)
Immature Granulocytes: 1 %
Lymphocytes Relative: 29 %
Lymphs Abs: 1.4 10*3/uL (ref 0.7–4.0)
MCH: 25.5 pg — ABNORMAL LOW (ref 26.0–34.0)
MCHC: 31.8 g/dL (ref 30.0–36.0)
MCV: 80.2 fL (ref 80.0–100.0)
Monocytes Absolute: 0.3 10*3/uL (ref 0.1–1.0)
Monocytes Relative: 6 %
Neutro Abs: 3.2 10*3/uL (ref 1.7–7.7)
Neutrophils Relative %: 63 %
Platelet Count: 260 10*3/uL (ref 150–400)
RBC: 4.04 MIL/uL — ABNORMAL LOW (ref 4.22–5.81)
RDW: 23.3 % — ABNORMAL HIGH (ref 11.5–15.5)
WBC Count: 5 10*3/uL (ref 4.0–10.5)
nRBC: 1.2 % — ABNORMAL HIGH (ref 0.0–0.2)

## 2022-11-29 LAB — COMPREHENSIVE METABOLIC PANEL
AST: 27 U/L (ref 10–35)
Albumin: 4.1 g/dL (ref 3.6–5.1)
Calcium: 8.9 mg/dL (ref 8.6–10.3)
Creat: 0.87 mg/dL (ref 0.70–1.28)
Sodium: 138 mmol/L (ref 135–146)
Total Protein: 7.1 g/dL (ref 6.1–8.1)

## 2022-11-29 LAB — MICROALBUMIN / CREATININE URINE RATIO
Creatinine, Urine: 132 mg/dL (ref 20–320)
Microalb, Ur: 0.3 mg/dL

## 2022-11-29 LAB — LIPID PANEL
Cholesterol: 111 mg/dL (ref <200)
Total CHOL/HDL Ratio: 4.4 (calc) (ref <5.0)

## 2022-11-29 LAB — HEMOGLOBIN A1C: Hgb A1c MFr Bld: 6.6 % of total Hgb — ABNORMAL HIGH (ref <5.7)

## 2022-11-29 NOTE — Therapy (Signed)
[facility] [address] [place], [place], [address] Phone: [phone]   Fax:  [phone]  Physical Therapy Treatment  Patient Details  Name: Paul Fry MRN: [id number] Date of Birth: [birth year] Referring Provider (PT): Bedelia Person, MD   Encounter Date: 11/29/2022   PT End of Session - 11/29/22 0949     Visit Number 3    Number of Visits 17    Date for PT Re-Evaluation 01/21/23    PT Start Time 0950    PT Stop Time 1029    PT Time Calculation (min) 39 min    Activity Tolerance Patient tolerated treatment well    Behavior During Therapy [facility] for tasks assessed/performed               Past Medical History:  Diagnosis Date   Allergy    SEASONAL   Anemia    Arthritis    KNEES   BPH (benign prostatic hyperplasia)    Difficult airway for intubation    Difficult airway for intubation 08/25/2021   Diverticulosis of colon    Elevated PSA    1.22 on 01-20-2016   Feeling of incomplete bladder emptying    GERD (gastroesophageal reflux disease)    Hiatal hernia    History of adenomatous polyp of colon    2014 tubular adenoma   History of chronic gastritis    History of Helicobacter pylori infection    07/ 2016   History of lower GI bleeding    07/ 2010 and 12/ 2011  diverticular bleed both times   Hyperlipidemia    Hypertension    Lower urinary tract symptoms (LUTS)    Nonischemic cardiomyopathy (HCC)    Plantar fascia syndrome    Presence of Watchman left atrial appendage closure device 07/09/2021   Watchman FLX 31mm with Dr. Lalla Brothers   Strains to urinate    INTERMITTANT   Type 2 diabetes, diet controlled (HCC)    ON BORDER LINE,NEVER TOOK MEDICATION.   Wears glasses     Past Surgical History:  Procedure Laterality Date   APPENDECTOMY  teenager   ATRIAL FIBRILLATION ABLATION N/A 05/15/2021   Procedure: ATRIAL FIBRILLATION ABLATION;  Surgeon: Lanier Prude, MD;  Location: [facility];  Service: Cardiovascular;  Laterality: N/A;   BIOPSY  11/19/2021   Procedure: BIOPSY;  Surgeon: Meryl Dare, MD;  Location: [facility];  Service: Gastroenterology;;   CARDIAC CATHETERIZATION  09-10-2008   dr dalton mclean   mild non-obstructive CAD;  30-40% mRCA, 20%pLAD,  ef 55%   CARDIOVASCULAR STRESS TEST  07/29/2011   Low risk nuclear study w/ no ischemia or infarct/  LVEF 47% and  apical hypokinesis (08-07-2012 cardiac MRI -- EF 49% w/ no hyperenhancement distal septal and apical hypokinesis, mild LAE, mild RVE, mild AR with mild dilated sinus 39mm)   CATARACT EXTRACTION W/ INTRAOCULAR LENS  IMPLANT, BILATERAL  2016   COLONOSCOPY  last one 12-21-2012   COLONOSCOPY WITH PROPOFOL N/A 11/19/2021   Procedure: COLONOSCOPY WITH PROPOFOL;  Surgeon: Meryl Dare, MD;  Location: [facility];  Service: Gastroenterology;  Laterality: N/A;   CYSTOSCOPY WITH INSERTION OF UROLIFT N/A 06/17/2016   Procedure: CYSTOSCOPY WITH INSERTION OF UROLIFT;  Surgeon: Jethro Bolus, MD;  Location: [facility];  Service: Urology;  Laterality: N/A;   ESOPHAGOGASTRODUODENOSCOPY  last one 11-22-2014   LEFT ATRIAL APPENDAGE OCCLUSION N/A 07/09/2021   Procedure: LEFT ATRIAL APPENDAGE OCCLUSION;  Surgeon: Lalla Brothers,  Rossie Muskrat, MD;  Location: [facility];  Service: Cardiovascular;  Laterality: N/A;   POLYPECTOMY  11/19/2021   Procedure: POLYPECTOMY;  Surgeon: Meryl Dare, MD;  Location: [facility];  Service: Gastroenterology;;   TEE WITHOUT CARDIOVERSION N/A 07/09/2021   Procedure: TRANSESOPHAGEAL ECHOCARDIOGRAM (TEE);  Surgeon: Lanier Prude, MD;  Location: [facility] INVAS[facility];  Service: Cardiovascular;  Laterality: N/A;   TEE WITHOUT CARDIOVERSION N/A 08/20/2021   Procedure: TRANSESOPHAGEAL ECHOCARDIOGRAM (TEE);  Surgeon: Christell Constant, MD;  Location: [facility] ENDOSCOPY;  Service: Cardiovascular;  Laterality: N/A;   TEE WITHOUT CARDIOVERSION N/A 01/08/2022   Procedure:  TRANSESOPHAGEAL ECHOCARDIOGRAM (TEE);  Surgeon: Wendall Stade, MD;  Location: [facility] ENDOSCOPY;  Service: Cardiovascular;  Laterality: N/A;   TRANSTHORACIC ECHOCARDIOGRAM  09/17/2014   ef 50-55%/  trivial AR, MR and PR/  mild LAE and RAE/  mild TR    There were no vitals filed for this visit.                          PCP: Joaquim Nam, MD   REFERRING PROVIDER: Bedelia Person, MD   REFERRING DIAG: Cervical Radiculopathy, M54.12   THERAPY DIAG:  Cervicalgia  Radiculopathy, cervical region  Rationale for Evaluation and Treatment: Rehabilitation  ONSET DATE: 11/10/22 (date PT referral signed)    Rationale for Evaluation and Treatment Rehabilitation  PERTINENT HISTORY: Neck pain, mostly in R shoulder. Pain goes to R lateral neck. Pain started a couple of months ago, suddenly, no known mechanism of injury. Pt is R hand dominant. Has not yet had PT for current condition. Also has R UE symptoms along the R C5/C6 dermatome. Had an X-ray for neck and R shoulder. Had an MRI for R shoulder wich did not reveal anything. Pain is the same as onset a few months ago.   PRECAUTIONS: no known precuations     SUBJECTIVE:   SUBJECTIVE STATEMENT: Still has R lateral neck tingling. No tingling currently, no pain currently.    PAIN:  Are you having pain? See subjective   TODAY'S TREATMENT:                                                                                                                                         DATE: 11/29/2022     Blood pressure is controlled per pt.  No latex allergies  B cervical paraspinal muscle tension L > R, B upper trap and scalene muscle tension  ( - ) median nerve tension; (+) R radial nerve tension with reproduction of R lateral neck symptoms.     Therapeutic exercise  Seated R first rib stretch with strap.   15 seconds. Reproduced R lateral neck tingling with medium stretch Then small stretch 30 seconds for 2  sets   Then with PT stabilizing R first rib area 30 seconds for 3 sets  No R lateral neck tingling   Standing R pectoralis stretch at doorway  30 seconds x 3 .   seated B scapular retraction 10x3 with 5 second holds   Standing R shoulder extension with scapular retraction green band 10x, then 7x  Demonstrates lumbar extension compensation, cues needed to correct  Also demonstrates difficulty with R scapular posterior tipping. R lateral neck tingling    Seated R scapular depression isometrics PT manual resistance 5 seconds.   R lateral neck tingling  Seated chin tucks 10x5 seconds     Improved exercise technique, movement at target joints, use of target muscles after mod to max verbal, visual, tactile cues.   Manual therapy   Seated STM R cervical paraspinal and R upper trap muscles to decrease tension      Response to treatment Pt tolerated session well without aggravation of symptoms.    Clinical impression Demonstrates difficulty with scapular mechanics, resulting in overuse of R upper trap and scalene muscles. Max cues needed to decrease R shoulder shrug/upper trap/scalene muscle compensation, and R scapular protraction and anterior tipping (increased pect minor activation) especially when upright.   Pt tolerated session well without aggravation of symptoms. Pt will benefit from continued skilled physical therapy services to decrease pain, improve strength and function.        PATIENT EDUCATION: Education details: there-ex, HEP Person educated: Patient Education method: Explanation, Demonstration, Tactile cues, and Verbal cues, handout Education comprehension: verbalized understanding and returned demonstration  HOME EXERCISE PROGRAM:  Access Code: [email or web address] URL: [email or web address] Date: 11/22/2022 Prepared by: Loralyn Freshwater  Exercises - Supine Deep Neck Flexor Nods  - 1 x daily - 7 x weekly - 3 sets - 10 reps - 5 seconds hold - Seated  Scapular Retraction  - 1 x daily - 7 x weekly - 3 sets - 10 reps - 5 seconds hold  - Single Arm Doorway Pec Stretch at 90 Degrees Abduction  - 3 x daily - 7 x weekly - 1 sets - 3 reps - 30 seconds hold                               PT Short Term Goals - 11/22/22 1622       PT SHORT TERM GOAL #1   Title Pt will be independent with his initial HEP to decrease pain, improve strength, function, and ability to reach, as well as look around more comfortably.    Baseline Pt has started his initial HEP (11/22/2022)    Time 3    Period Weeks    Status New    Target Date 12/17/22               PT Long Term Goals - 11/22/22 1623       PT LONG TERM GOAL #1   Title Pt will have a decrease in neck and R UE pain to 3/10 or less at worst to promote ability to reach as well as look around more comfortably.    Baseline 9/10 R lateral neck and upper trap area pain at worst for the past 3 months (11/22/2022)    Time 8    Period Weeks    Status New    Target Date 01/21/23      PT LONG TERM GOAL #2   Title Pt will improve B lower trap strength by at least 1/2 MMT grade to help decrease upper trap and scalene muscle tension, improve  ability to look around as well as reach with his R arm more comfortably.    Baseline Seated manually resisted scapular retraction targeting the lower trap 3+/5 R and L (11/22/2022)    Time 8    Period Weeks    Status New    Target Date 01/21/23      PT LONG TERM GOAL #3   Title Pt will improve his neck FOTO score by at least 10 points as a demonstration of improved function.    Baseline Neck FOTO 67 (11/22/2022)    Time 8    Period Weeks    Status New    Target Date 01/21/23                    Plan - 11/29/22 0949     Clinical Impression Statement Demonstrates difficulty with scapular mechanics, resulting in overuse of R upper trap and scalene muscles. Max cues needed to decrease R shoulder shrug/upper trap/scalene muscle  compensation, and R scapular protraction and anterior tipping (increased pect minor activation) especially when upright.    Pt tolerated session well without aggravation of symptoms. Pt will benefit from continued skilled physical therapy services to decrease pain, improve strength and function.    Personal Factors and Comorbidities Comorbidity 3+;Age    Comorbidities Anemia, BPH, GERD, HTN, DM    Examination-Activity Limitations Dressing;Lift;Reach Overhead;Other   looking around   Stability/Clinical Decision Making Stable/Uncomplicated    Rehab Potential Fair    PT Frequency 2x / week    PT Duration 8 weeks    PT Treatment/Interventions Therapeutic activities;Therapeutic exercise;Neuromuscular re-education;Patient/family education;Manual techniques;Dry needling;Electrical Stimulation;Iontophoresis 4mg /ml Dexamethasone;Traction   traction if appropriate   PT Next Visit Plan posture, lower trap and anterior cervical muscle strengthening, decreasing cervical paraspinal, upper trap and scalene muscle tension, manual techniques, modalities PRN    PT Home Exercise Plan Medbridge Access Code: [email or web address]    Consulted and Agree with Plan of Care Patient             Patient will benefit from skilled therapeutic intervention in order to improve the following deficits and impairments:  Pain, Postural dysfunction, Improper body mechanics, Impaired UE functional use, Decreased strength, Decreased range of motion  Visit Diagnosis: Cervicalgia  Radiculopathy, cervical region     Problem List Patient Active Problem List   Diagnosis Date Noted   Neck pain 09/19/2022   Loose stools 01/13/2022   Family history of colon cancer    Hx of adenomatous colonic polyps    Benign neoplasm of cecum    Benign neoplasm of transverse colon    Difficult airway for intubation 08/25/2021   Anemia 08/02/2021   Presence of Watchman left atrial appendage closure device 07/09/2021   Secondary hypercoagulable state  (HCC) 06/12/2021   H. pylori infection 03/27/2021   Atrial fibrillation (HCC) 12/04/2020   Stool incontinence 04/23/2020   Caregiver stress 10/24/2019   Pelvic floor dysfunction 10/24/2019   Skin tag 10/18/2018   Healthcare maintenance 10/15/2018   Plantar fasciitis 10/15/2018   Diabetes mellitus without complication (HCC) 07/30/2018   BPH (benign prostatic hyperplasia) 12/23/2016   Lower GI bleed 12/22/2016   Advance care planning 07/16/2014   Hyperglycemia 07/16/2014   Skin lesion 02/27/2014   HTN (hypertension) 07/09/2013   Paresthesia 12/25/2012   NICM (nonischemic cardiomyopathy) (HCC) 09/26/2012   ED (erectile dysfunction) 05/26/2012   DOE (dyspnea on exertion) 06/18/2011   Medicare annual wellness visit, subsequent 05/26/2011   Hypercholesteremia 03/03/2011   CAD in native  artery 10/14/2008   Gastroesophageal reflux disease 10/14/2008   DIVERTICULOSIS OF COLON 04/03/2008   Loralyn Freshwater PT, DPT  11/29/2022, 11:33 AM  [facility] [address], [place], [address] Phone: [phone]   Fax:  [phone]  Name: Paul Fry MRN: [id number] Date of Birth: Apr 14, 1946

## 2022-11-30 DIAGNOSIS — D471 Chronic myeloproliferative disease: Secondary | ICD-10-CM | POA: Diagnosis not present

## 2022-11-30 DIAGNOSIS — D464 Refractory anemia, unspecified: Secondary | ICD-10-CM | POA: Diagnosis not present

## 2022-12-01 DIAGNOSIS — D464 Refractory anemia, unspecified: Secondary | ICD-10-CM | POA: Diagnosis not present

## 2022-12-01 DIAGNOSIS — R718 Other abnormality of red blood cells: Secondary | ICD-10-CM | POA: Diagnosis not present

## 2022-12-01 DIAGNOSIS — D7581 Myelofibrosis: Secondary | ICD-10-CM | POA: Diagnosis not present

## 2022-12-01 DIAGNOSIS — D471 Chronic myeloproliferative disease: Secondary | ICD-10-CM | POA: Diagnosis not present

## 2022-12-02 ENCOUNTER — Ambulatory Visit: Payer: Medicare Other

## 2022-12-02 DIAGNOSIS — M542 Cervicalgia: Secondary | ICD-10-CM | POA: Diagnosis not present

## 2022-12-02 DIAGNOSIS — M5412 Radiculopathy, cervical region: Secondary | ICD-10-CM | POA: Diagnosis not present

## 2022-12-02 NOTE — Therapy (Signed)
[facility] [address] [place], [place], [address] Phone: [phone]   Fax:  [phone]  Physical Therapy Treatment  Patient Details  Name: Paul Fry MRN: [id number] Date of Birth: 07-29-1945 Referring Provider (PT): Bedelia Person, MD   Encounter Date: 12/02/2022   PT End of Session - 12/02/22 0902     Visit Number 4    Number of Visits 17    Date for PT Re-Evaluation 01/21/23    PT Start Time 0902    PT Stop Time 0943    PT Time Calculation (min) 41 min    Activity Tolerance Patient tolerated treatment well    Behavior During Therapy [facility] for tasks assessed/performed                Past Medical History:  Diagnosis Date   Allergy    SEASONAL   Anemia    Arthritis    KNEES   BPH (benign prostatic hyperplasia)    Difficult airway for intubation    Difficult airway for intubation 08/25/2021   Diverticulosis of colon    Elevated PSA    1.22 on 01-20-2016   Feeling of incomplete bladder emptying    GERD (gastroesophageal reflux disease)    Hiatal hernia    History of adenomatous polyp of colon    2014 tubular adenoma   History of chronic gastritis    History of Helicobacter pylori infection    07/ 2016   History of lower GI bleeding    07/ 2010 and 12/ 2011  diverticular bleed both times   Hyperlipidemia    Hypertension    Lower urinary tract symptoms (LUTS)    Nonischemic cardiomyopathy (HCC)    Plantar fascia syndrome    Presence of Watchman left atrial appendage closure device 07/09/2021   Watchman FLX 31mm with Dr. Lalla Brothers   Strains to urinate    INTERMITTANT   Type 2 diabetes, diet controlled (HCC)    ON BORDER LINE,NEVER TOOK MEDICATION.   Wears glasses     Past Surgical History:  Procedure Laterality Date   APPENDECTOMY  teenager   ATRIAL FIBRILLATION ABLATION N/A 05/15/2021   Procedure: ATRIAL FIBRILLATION ABLATION;  Surgeon: Lanier Prude, MD;  Location: [facility] INVASIVE CV  LAB;  Service: Cardiovascular;  Laterality: N/A;   BIOPSY  11/19/2021   Procedure: BIOPSY;  Surgeon: Meryl Dare, MD;  Location: [facility] ENDOSCOPY;  Service: Gastroenterology;;   CARDIAC CATHETERIZATION  09-10-2008   dr dalton mclean   mild non-obstructive CAD;  30-40% mRCA, 20%pLAD,  ef 55%   CARDIOVASCULAR STRESS TEST  07/29/2011   Low risk nuclear study w/ no ischemia or infarct/  LVEF 47% and  apical hypokinesis (08-07-2012 cardiac MRI -- EF 49% w/ no hyperenhancement distal septal and apical hypokinesis, mild LAE, mild RVE, mild AR with mild dilated sinus 39mm)   CATARACT EXTRACTION W/ INTRAOCULAR LENS  IMPLANT, BILATERAL  2016   COLONOSCOPY  last one 12-21-2012   COLONOSCOPY WITH PROPOFOL N/A 11/19/2021   Procedure: COLONOSCOPY WITH PROPOFOL;  Surgeon: Meryl Dare, MD;  Location: [facility] ENDOSCOPY;  Service: Gastroenterology;  Laterality: N/A;   CYSTOSCOPY WITH INSERTION OF UROLIFT N/A 06/17/2016   Procedure: CYSTOSCOPY WITH INSERTION OF UROLIFT;  Surgeon: Jethro Bolus, MD;  Location: [facility];  Service: Urology;  Laterality: N/A;   ESOPHAGOGASTRODUODENOSCOPY  last one 11-22-2014   LEFT ATRIAL APPENDAGE OCCLUSION N/A 07/09/2021   Procedure: LEFT ATRIAL APPENDAGE OCCLUSION;  Surgeon:  Lanier Prude, MD;  Location: [facility] INVASIVE CV LAB;  Service: Cardiovascular;  Laterality: N/A;   POLYPECTOMY  11/19/2021   Procedure: POLYPECTOMY;  Surgeon: Meryl Dare, MD;  Location: [facility] ENDOSCOPY;  Service: Gastroenterology;;   TEE WITHOUT CARDIOVERSION N/A 07/09/2021   Procedure: TRANSESOPHAGEAL ECHOCARDIOGRAM (TEE);  Surgeon: Lanier Prude, MD;  Location: [facility] INVASIVE CV LAB;  Service: Cardiovascular;  Laterality: N/A;   TEE WITHOUT CARDIOVERSION N/A 08/20/2021   Procedure: TRANSESOPHAGEAL ECHOCARDIOGRAM (TEE);  Surgeon: Christell Constant, MD;  Location: [facility] ENDOSCOPY;  Service: Cardiovascular;  Laterality: N/A;   TEE WITHOUT CARDIOVERSION N/A 01/08/2022   Procedure:  TRANSESOPHAGEAL ECHOCARDIOGRAM (TEE);  Surgeon: Wendall Stade, MD;  Location: [facility] ENDOSCOPY;  Service: Cardiovascular;  Laterality: N/A;   TRANSTHORACIC ECHOCARDIOGRAM  09/17/2014   ef 50-55%/  trivial AR, MR and PR/  mild LAE and RAE/  mild TR    There were no vitals filed for this visit.                          PCP: Joaquim Nam, MD   REFERRING PROVIDER: Bedelia Person, MD   REFERRING DIAG: Cervical Radiculopathy, M54.12   THERAPY DIAG:  Cervicalgia  Radiculopathy, cervical region  Rationale for Evaluation and Treatment: Rehabilitation  ONSET DATE: 11/10/22 (date PT referral signed)    Rationale for Evaluation and Treatment Rehabilitation  PERTINENT HISTORY: Neck pain, mostly in R shoulder. Pain goes to R lateral neck. Pain started a couple of months ago, suddenly, no known mechanism of injury. Pt is R hand dominant. Has not yet had PT for current condition. Also has R UE symptoms along the R C5/C6 dermatome. Had an X-ray for neck and R shoulder. Had an MRI for R shoulder wich did not reveal anything. Pain is the same as onset a few months ago.   PRECAUTIONS: no known precuations     SUBJECTIVE:   SUBJECTIVE STATEMENT: Neck is doing about the same. No pain or tingling right now.    PAIN:  Are you having pain? See subjective   TODAY'S TREATMENT:                                                                                                                                         DATE: 12/01/2022     Blood pressure is controlled per pt.  No latex allergies  B cervical paraspinal muscle tension L > R, B upper trap and scalene muscle tension  ( - ) median nerve tension; (+) R radial nerve tension with reproduction of R lateral neck symptoms.     Therapeutic exercise  Seated cervical rotation   R 45 degrees  L 40 degrees   With strap on L first rib  Cervical rotation    R 10x3    Increased R lateral neck tingling. Slowly  eases with rest. Decreases  with increased repetition   L 10x3  With strap on R first rib  Cervical rotation    R 10x3    R lateral neck tingling which decreased with increased repetition   L 10x3   Seated press-up isometrics 10x3 with 5 second holds      Standing R pectoralis stretch at doorway  30 seconds x 3  Supine B shoulder flexion to promote thoracic extension to decrease extension stress to neck 10x3 with 5 second holds   Supine cervical rotation with cervical nod 10x3 each direction  Decreased neck tightness reported afterwards   Seated cervical rotation after session  R 50 degrees  L 50 degrees    Improved exercise technique, movement at target joints, use of target muscles after mod to max verbal, visual, tactile cues.     Response to treatment Fair tolerance to today's session.    Clinical impression Decreased R lateral neck pain and stiffness with rotation with decreasing R and L scalene muscle tension. Fair tolerance to today's session. Pt will benefit from continued skilled physical therapy services to decrease pain, improve strength and function.        PATIENT EDUCATION: Education details: there-ex, HEP Person educated: Patient Education method: Explanation, Demonstration, Tactile cues, and Verbal cues, handout Education comprehension: verbalized understanding and returned demonstration  HOME EXERCISE PROGRAM:  Access Code: [email or web address] URL: [email or web address] Date: 11/22/2022 Prepared by: Loralyn Freshwater  Exercises - Supine Deep Neck Flexor Nods  - 1 x daily - 7 x weekly - 3 sets - 10 reps - 5 seconds hold - Seated Scapular Retraction  - 1 x daily - 7 x weekly - 3 sets - 10 reps - 5 seconds hold  - Single Arm Doorway Pec Stretch at 90 Degrees Abduction  - 3 x daily - 7 x weekly - 1 sets - 3 reps - 30 seconds hold   With strap on L first rib  Cervical rotation    R 10x3    Increased R lateral neck tingling. Slowly eases with  rest. Decreases with increased repetition   L 10x3  With strap on R first rib  Cervical rotation    R 10x3    R lateral neck tingling which decreased with increased repetition   L 10x3   - Supine Shoulder Flexion Extension Full Range AROM  - 1 x daily - 7 x weekly - 3 sets - 10 reps - 5 seconds hold                            PT Short Term Goals - 11/22/22 1622       PT SHORT TERM GOAL #1   Title Pt will be independent with his initial HEP to decrease pain, improve strength, function, and ability to reach, as well as look around more comfortably.    Baseline Pt has started his initial HEP (11/22/2022)    Time 3    Period Weeks    Status New    Target Date 12/17/22               PT Long Term Goals - 11/22/22 1623       PT LONG TERM GOAL #1   Title Pt will have a decrease in neck and R UE pain to 3/10 or less at worst to promote ability to reach as well as look around more comfortably.    Baseline 9/10 R lateral neck and upper trap area  pain at worst for the past 3 months (11/22/2022)    Time 8    Period Weeks    Status New    Target Date 01/21/23      PT LONG TERM GOAL #2   Title Pt will improve B lower trap strength by at least 1/2 MMT grade to help decrease upper trap and scalene muscle tension, improve ability to look around as well as reach with his R arm more comfortably.    Baseline Seated manually resisted scapular retraction targeting the lower trap 3+/5 R and L (11/22/2022)    Time 8    Period Weeks    Status New    Target Date 01/21/23      PT LONG TERM GOAL #3   Title Pt will improve his neck FOTO score by at least 10 points as a demonstration of improved function.    Baseline Neck FOTO 67 (11/22/2022)    Time 8    Period Weeks    Status New    Target Date 01/21/23                    Plan - 12/02/22 0858     Clinical Impression Statement Decreased R lateral neck pain and stiffness with rotation with decreasing R and  L scalene muscle tension. Fair tolerance to today's session. Pt will benefit from continued skilled physical therapy services to decrease pain, improve strength and function.    Personal Factors and Comorbidities Comorbidity 3+;Age    Comorbidities Anemia, BPH, GERD, HTN, DM    Examination-Activity Limitations Dressing;Lift;Reach Overhead;Other   looking around   Stability/Clinical Decision Making Stable/Uncomplicated    Clinical Decision Making Low    Rehab Potential Fair    PT Frequency 2x / week    PT Duration 8 weeks    PT Treatment/Interventions Therapeutic activities;Therapeutic exercise;Neuromuscular re-education;Patient/family education;Manual techniques;Dry needling;Electrical Stimulation;Iontophoresis 4mg /ml Dexamethasone;Traction   traction if appropriate   PT Next Visit Plan posture, lower trap and anterior cervical muscle strengthening, decreasing cervical paraspinal, upper trap and scalene muscle tension, manual techniques, modalities PRN    PT Home Exercise Plan Medbridge Access Code: [email or web address]    Consulted and Agree with Plan of Care Patient             Patient will benefit from skilled therapeutic intervention in order to improve the following deficits and impairments:  Pain, Postural dysfunction, Improper body mechanics, Impaired UE functional use, Decreased strength, Decreased range of motion  Visit Diagnosis: Cervicalgia  Radiculopathy, cervical region     Problem List Patient Active Problem List   Diagnosis Date Noted   Neck pain 09/19/2022   Loose stools 01/13/2022   Family history of colon cancer    Hx of adenomatous colonic polyps    Benign neoplasm of cecum    Benign neoplasm of transverse colon    Difficult airway for intubation 08/25/2021   Anemia 08/02/2021   Presence of Watchman left atrial appendage closure device 07/09/2021   Secondary hypercoagulable state (HCC) 06/12/2021   H. pylori infection 03/27/2021   Atrial fibrillation (HCC)  12/04/2020   Stool incontinence 04/23/2020   Caregiver stress 10/24/2019   Pelvic floor dysfunction 10/24/2019   Skin tag 10/18/2018   Healthcare maintenance 10/15/2018   Plantar fasciitis 10/15/2018   Diabetes mellitus without complication (HCC) 07/30/2018   BPH (benign prostatic hyperplasia) 12/23/2016   Lower GI bleed 12/22/2016   Advance care planning 07/16/2014   Hyperglycemia 07/16/2014   Skin lesion 02/27/2014  HTN (hypertension) 07/09/2013   Paresthesia 12/25/2012   NICM (nonischemic cardiomyopathy) (HCC) 09/26/2012   ED (erectile dysfunction) 05/26/2012   DOE (dyspnea on exertion) 06/18/2011   Medicare annual wellness visit, subsequent 05/26/2011   Hypercholesteremia 03/03/2011   CAD in native artery 10/14/2008   Gastroesophageal reflux disease 10/14/2008   DIVERTICULOSIS OF COLON 04/03/2008   Loralyn Freshwater PT, DPT  12/02/2022, 9:50 AM  [facility] [address], [place], [address] Phone: [phone]   Fax:  [phone]  Name: Paul Fry MRN: [id number] Date of Birth: 1945/11/05

## 2022-12-03 ENCOUNTER — Encounter: Payer: Self-pay | Admitting: Family Medicine

## 2022-12-03 ENCOUNTER — Telehealth: Payer: Self-pay

## 2022-12-03 ENCOUNTER — Ambulatory Visit (INDEPENDENT_AMBULATORY_CARE_PROVIDER_SITE_OTHER): Payer: Medicare Other | Admitting: Family Medicine

## 2022-12-03 VITALS — BP 122/74 | HR 78 | Temp 97.6°F | Ht 72.0 in | Wt 196.0 lb

## 2022-12-03 DIAGNOSIS — Z7189 Other specified counseling: Secondary | ICD-10-CM

## 2022-12-03 DIAGNOSIS — E7800 Pure hypercholesterolemia, unspecified: Secondary | ICD-10-CM

## 2022-12-03 DIAGNOSIS — E119 Type 2 diabetes mellitus without complications: Secondary | ICD-10-CM | POA: Diagnosis not present

## 2022-12-03 DIAGNOSIS — Z0000 Encounter for general adult medical examination without abnormal findings: Secondary | ICD-10-CM

## 2022-12-03 DIAGNOSIS — D649 Anemia, unspecified: Secondary | ICD-10-CM

## 2022-12-03 DIAGNOSIS — L989 Disorder of the skin and subcutaneous tissue, unspecified: Secondary | ICD-10-CM

## 2022-12-03 DIAGNOSIS — N400 Enlarged prostate without lower urinary tract symptoms: Secondary | ICD-10-CM

## 2022-12-03 MED ORDER — TAMSULOSIN HCL 0.4 MG PO CAPS: 0.8000 mg | ORAL_CAPSULE | Freq: Every day | ORAL | 3 refills | Status: DC

## 2022-12-03 NOTE — Patient Instructions (Addendum)
Hold atorvastatin for 2 weeks and see if the cramping gets better. Either way, let me know.   Take care.  Glad to see you. I will await notes from the blood clinic.   Recheck sugar labs before a visit in about 6 months.

## 2022-12-03 NOTE — Progress Notes (Unsigned)
He had hematology labs with f/u pending re: myelofibrosis.  D/w pt about recent labs- I need hematology input.    Diabetes:  No meds.  Hypoglycemic episodes: no sx Hyperglycemic episodes: no sx Feet problems: some occ tingling.  Intermittent.   Blood Sugars averaging: not checked.   eye exam within last year: pending for next week.   A1c 6.6, fructosamine estimate A1c 6.4.     Elevated Cholesterol: Using medications without problems: yes Muscle aches: some cramping at night.  B calf.  No exertional sx.   Diet compliance: no Exercise:no Labs d/w pt.    He is off plavix in the meantime.  I will check with cardiology about that.  D/w pt.     Small mass R side of the neck.  Present for about 1 year.  Not painful, not enlarging.  Not draining.  It feels like a small seb cyst more than a node.  No other LA.     LUTS improved on flomax, stream is better.     Flu encouraged Shingrix done PNA UTD Tetanus 2010, dw pt.   covid vaccine prev done.  RSV prev done Colonoscopy 2023 PSA wnl 2022.  D/w pt.   Advance directive- niece Alphonzo Saah designated if patient were incapacitated.   He is going to PT.  D/w pt.  Has f/u pending.     PMH and SH reviewed   Meds, vitals, and allergies reviewed.    ROS: Per HPI unless specifically indicated in ROS section    GEN: nad, alert and oriented HEENT: ncat NECK: supple w/o LA Small mass R side of the neck.  Superficial.  Not painful.  Not draining.  It feels like a small seb cyst more than a node.  No other LA. CV: rrr. PULM: ctab, no inc wob ABD: soft, +bs EXT: no edema SKIN: no acute rash but 1cm brown macule on R medial shin- longstanding per patient report.     Diabetic foot exam: Normal inspection No skin breakdown No callous Normal DP pulses Normal sensation to light touch and monofilament Nails normal L foot with 5mm dark macule near the posterior lateal midfoot.  This is round and not irregular.  It is uniform and does not  have varying pigmentation. No change from prior.

## 2022-12-03 NOTE — Patient Outreach (Signed)
  Care Coordination   Initial Visit Note   12/03/2022 Name: Paul Fry MRN: [id number] DOB: 1945-05-18  Paul Fry is a 77 y.o. year old male who sees Joaquim Nam, MD for primary care. I spoke with  Paul Fry by phone today.  What matters to the patients health and wellness today?  Patient denies having any care coordination and / or community resource needs.     Goals Addressed             This Visit's Progress    COMPLETED: Care coordination activities - no follow up needed.       Interventions Today    Flowsheet Row Most Recent Value  General Interventions   General Interventions Discussed/Reviewed General Interventions Discussed  [Care coordination services discussed. SDOH survey completed. AWV discussed and patient advised to contact provider office to schedule. Discussed vaccines. Advised to contact PCP office if care coordination services needed in the future.]               SDOH assessments and interventions completed:  Yes  SDOH Interventions Today    Flowsheet Row Most Recent Value  SDOH Interventions   Food Insecurity Interventions Intervention Not Indicated  Housing Interventions Intervention Not Indicated  Transportation Interventions Intervention Not Indicated        Care Coordination Interventions:  Yes, provided   Follow up plan: No further intervention required.   Encounter Outcome:  Pt. Visit Completed   George Ina RN,BSN,CCM [facility] Care Coordination [phone] direct line

## 2022-12-04 NOTE — Assessment & Plan Note (Signed)
Advance directive- niece Janice Borntreger designated if patient were incapacitated.  

## 2022-12-04 NOTE — Assessment & Plan Note (Signed)
He had hematology labs with f/u pending re: myelofibrosis.  D/w pt about recent labs- I need hematology input.

## 2022-12-04 NOTE — Assessment & Plan Note (Signed)
LUTS improved on flomax, stream is better.  Would continue as is.

## 2022-12-04 NOTE — Assessment & Plan Note (Signed)
Small mass R side of the neck.  Superficial.  Not painful.  Not draining.  It feels like a small seb cyst more than a node.  No other LA. I asked patient to monitor in the meantime.  L foot with 5mm dark macule near the posterior lateal midfoot.  This is round and not irregular.  It is uniform and does not have varying pigmentation. No change from prior.

## 2022-12-04 NOTE — Assessment & Plan Note (Signed)
A1c 6.6, fructosamine estimate A1c 6.4.   no change in meds at this point.  Recheck periodically, continue work on diet.

## 2022-12-04 NOTE — Assessment & Plan Note (Signed)
Flu encouraged Shingrix done PNA UTD Tetanus 2010, dw pt.   covid vaccine prev done.  RSV prev done Colonoscopy 2023 PSA wnl 2022.  D/w pt.   Advance directive- niece Lundon Sport designated if patient were incapacitated.

## 2022-12-04 NOTE — Assessment & Plan Note (Signed)
Hold atorvastatin for 2 weeks and see if the cramping gets better. Either way, let me know.

## 2022-12-06 ENCOUNTER — Ambulatory Visit: Payer: Medicare Other

## 2022-12-06 DIAGNOSIS — M5412 Radiculopathy, cervical region: Secondary | ICD-10-CM

## 2022-12-06 DIAGNOSIS — M542 Cervicalgia: Secondary | ICD-10-CM

## 2022-12-06 NOTE — Therapy (Signed)
[facility] [address] [place], [place], [address] Phone: [phone]   Fax:  [phone]  Physical Therapy Treatment  Patient Details  Name: Paul Fry MRN: [id number] Date of Birth: 1946/04/02 Referring Provider (PT): Bedelia Person, MD   Encounter Date: 12/06/2022   PT End of Session - 12/06/22 0950     Visit Number 5    Number of Visits 17    Date for PT Re-Evaluation 01/21/23    PT Start Time 0950    PT Stop Time 1030    PT Time Calculation (min) 40 min    Activity Tolerance Patient tolerated treatment well    Behavior During Therapy [facility] for tasks assessed/performed                 Past Medical History:  Diagnosis Date   Allergy    SEASONAL   Anemia    Arthritis    KNEES   BPH (benign prostatic hyperplasia)    Cancer (HCC)    Clotting disorder (HCC)    Difficult airway for intubation 08/25/2021   Diverticulosis of colon    Elevated PSA    1.22 on 01-20-2016   Feeling of incomplete bladder emptying    GERD (gastroesophageal reflux disease)    Hiatal hernia    History of adenomatous polyp of colon    2014 tubular adenoma   History of chronic gastritis    History of Helicobacter pylori infection    07/ 2016   History of lower GI bleeding    07/ 2010 and 12/ 2011  diverticular bleed both times   Hyperlipidemia    Hypertension    Lower urinary tract symptoms (LUTS)    Nonischemic cardiomyopathy (HCC)    Plantar fascia syndrome    Presence of Watchman left atrial appendage closure device 07/09/2021   Watchman FLX 31mm with Dr. Lalla Brothers   Strains to urinate    INTERMITTANT   Type 2 diabetes, diet controlled (HCC)    ON BORDER LINE,NEVER TOOK MEDICATION.   Wears glasses     Past Surgical History:  Procedure Laterality Date   APPENDECTOMY  teenager   ATRIAL FIBRILLATION ABLATION N/A 05/15/2021   Procedure: ATRIAL FIBRILLATION ABLATION;  Surgeon: Lanier Prude, MD;  Location:  [facility]AS[facility];  Service: Cardiovascular;  Laterality: N/A;   BIOPSY  11/19/2021   Procedure: BIOPSY;  Surgeon: Meryl Dare, MD;  Location: [facility] ENDOSCOPY;  Service: Gastroenterology;;   CARDIAC CATHETERIZATION  09-10-2008   dr dalton mclean   mild non-obstructive CAD;  30-40% mRCA, 20%pLAD,  ef 55%   CARDIOVASCULAR STRESS TEST  07/29/2011   Low risk nuclear study w/ no ischemia or infarct/  LVEF 47% and  apical hypokinesis (08-07-2012 cardiac MRI -- EF 49% w/ no hyperenhancement distal septal and apical hypokinesis, mild LAE, mild RVE, mild AR with mild dilated sinus 39mm)   CATARACT EXTRACTION W/ INTRAOCULAR LENS  IMPLANT, BILATERAL  2016   COLONOSCOPY  last one 12-21-2012   COLONOSCOPY WITH PROPOFOL N/A 11/19/2021   Procedure: COLONOSCOPY WITH PROPOFOL;  Surgeon: Meryl Dare, MD;  Location: [facility];  Service: Gastroenterology;  Laterality: N/A;   CYSTOSCOPY WITH INSERTION OF UROLIFT N/A 06/17/2016   Procedure: CYSTOSCOPY WITH INSERTION OF UROLIFT;  Surgeon: Jethro Bolus, MD;  Location: [facility];  Service: Urology;  Laterality: N/A;   ESOPHAGOGASTRODUODENOSCOPY  last one 11-22-2014   EYE SURGERY     LEFT ATRIAL APPENDAGE OCCLUSION  N/A 07/09/2021   Procedure: LEFT ATRIAL APPENDAGE OCCLUSION;  Surgeon: Lanier Prude, MD;  Location: [facility] INVASIVE CV LAB;  Service: Cardiovascular;  Laterality: N/A;   POLYPECTOMY  11/19/2021   Procedure: POLYPECTOMY;  Surgeon: Meryl Dare, MD;  Location: [facility];  Service: Gastroenterology;;   TEE WITHOUT CARDIOVERSION N/A 07/09/2021   Procedure: TRANSESOPHAGEAL ECHOCARDIOGRAM (TEE);  Surgeon: Lanier Prude, MD;  Location: [facility] INVASIVE CV LAB;  Service: Cardiovascular;  Laterality: N/A;   TEE WITHOUT CARDIOVERSION N/A 08/20/2021   Procedure: TRANSESOPHAGEAL ECHOCARDIOGRAM (TEE);  Surgeon: Christell Constant, MD;  Location: [facility] ENDOSCOPY;  Service: Cardiovascular;  Laterality: N/A;   TEE WITHOUT  CARDIOVERSION N/A 01/08/2022   Procedure: TRANSESOPHAGEAL ECHOCARDIOGRAM (TEE);  Surgeon: Wendall Stade, MD;  Location: [facility] ENDOSCOPY;  Service: Cardiovascular;  Laterality: N/A;   TRANSTHORACIC ECHOCARDIOGRAM  09/17/2014   ef 50-55%/  trivial AR, MR and PR/  mild LAE and RAE/  mild TR    There were no vitals filed for this visit.                          PCP: Joaquim Nam, MD   REFERRING PROVIDER: Bedelia Person, MD   REFERRING DIAG: Cervical Radiculopathy, M54.12   THERAPY DIAG:  Cervicalgia  Radiculopathy, cervical region  Rationale for Evaluation and Treatment: Rehabilitation  ONSET DATE: 11/10/22 (date PT referral signed)    Rationale for Evaluation and Treatment Rehabilitation  PERTINENT HISTORY: Neck pain, mostly in R shoulder. Pain goes to R lateral neck. Pain started a couple of months ago, suddenly, no known mechanism of injury. Pt is R hand dominant. Has not yet had PT for current condition. Also has R UE symptoms along the R C5/C6 dermatome. Had an X-ray for neck and R shoulder. Had an MRI for R shoulder wich did not reveal anything. Pain is the same as onset a few months ago.   PRECAUTIONS: no known precuations     SUBJECTIVE:   SUBJECTIVE STATEMENT: Neck is doing about the same. No pain right now, a little sore.   PAIN:  Are you having pain? See subjective   TODAY'S TREATMENT:                                                                                                                                         DATE: 12/06/2022     Blood pressure is controlled per pt.  No latex allergies  B cervical paraspinal muscle tension L > R, B upper trap and scalene muscle tension  ( - ) median nerve tension; (+) R radial nerve tension with reproduction of R lateral neck symptoms.      Manual therapy Seated STM R and L upper trap, cervical paraspinal muscles and R levator scapula muscle    Therapeutic  exercise  Seated cervical rotation   R 40 degrees  L 50 degrees   Seated manually resisted scapular retraction targeting the lower trap muscles   R 10x3 with 5 second holds  L 10x3 with 5 second holds  Seated L cervical side bend to stretch R side 15 seconds   R lateral neck tingling, eases with rest  Seated chin tucks 10x5 seconds for 3 sets  Max cues for maintaining proper scapular and neck posture during the initial set.   Seated R levator scapula stretch 20 seconds   R lateral neck tingling, slowly eases with rest  With PT manual pressure to R first rib  R and L cervical rotation 10x2      Improved exercise technique, movement at target joints, use of target muscles after mod to max verbal, visual, tactile cues.     Response to treatment Pt tolerated session well without aggravation of symptoms.    Clinical impression Demonstrates difficulty with decreasing upper trap and pectoralis muscle compensation, max cues needed to correct. Continued working on lower trap strengthening, upper thoracic extension and decreasing upper trap and scalene muscle tension to decrease stress to affected areas. Pt tolerated session well without aggravation of symptoms.  Pt will benefit from continued skilled physical therapy services to decrease pain, improve strength and function.        PATIENT EDUCATION: Education details: there-ex, HEP Person educated: Patient Education method: Explanation, Demonstration, Tactile cues, and Verbal cues, handout Education comprehension: verbalized understanding and returned demonstration  HOME EXERCISE PROGRAM:  Access Code: [email or web address] URL: [email or web address] Date: 11/22/2022 Prepared by: Loralyn Freshwater  Exercises - Supine Deep Neck Flexor Nods  - 1 x daily - 7 x weekly - 3 sets - 10 reps - 5 seconds hold - Seated Scapular Retraction  - 1 x daily - 7 x weekly - 3 sets - 10 reps - 5 seconds hold  - Single Arm Doorway Pec  Stretch at 90 Degrees Abduction  - 3 x daily - 7 x weekly - 1 sets - 3 reps - 30 seconds hold   With strap on L first rib  Cervical rotation    R 10x3    Increased R lateral neck tingling. Slowly eases with rest. Decreases with increased repetition   L 10x3  With strap on R first rib  Cervical rotation    R 10x3    R lateral neck tingling which decreased with increased repetition   L 10x3   - Supine Shoulder Flexion Extension Full Range AROM  - 1 x daily - 7 x weekly - 3 sets - 10 reps - 5 seconds hold                            PT Short Term Goals - 11/22/22 1622       PT SHORT TERM GOAL #1   Title Pt will be independent with his initial HEP to decrease pain, improve strength, function, and ability to reach, as well as look around more comfortably.    Baseline Pt has started his initial HEP (11/22/2022)    Time 3    Period Weeks    Status New    Target Date 12/17/22               PT Long Term Goals - 11/22/22 1623       PT LONG TERM GOAL #1   Title Pt will have a decrease in neck and R UE pain to 3/10 or less at worst to  promote ability to reach as well as look around more comfortably.    Baseline 9/10 R lateral neck and upper trap area pain at worst for the past 3 months (11/22/2022)    Time 8    Period Weeks    Status New    Target Date 01/21/23      PT LONG TERM GOAL #2   Title Pt will improve B lower trap strength by at least 1/2 MMT grade to help decrease upper trap and scalene muscle tension, improve ability to look around as well as reach with his R arm more comfortably.    Baseline Seated manually resisted scapular retraction targeting the lower trap 3+/5 R and L (11/22/2022)    Time 8    Period Weeks    Status New    Target Date 01/21/23      PT LONG TERM GOAL #3   Title Pt will improve his neck FOTO score by at least 10 points as a demonstration of improved function.    Baseline Neck FOTO 67 (11/22/2022)    Time 8    Period  Weeks    Status New    Target Date 01/21/23                    Plan - 12/06/22 0949     Clinical Impression Statement Demonstrates difficulty with decreasing upper trap and pectoralis muscle compensation, max cues needed to correct. Continued working on lower trap strengthening, upper thoracic extension and decreasing upper trap and scalene muscle tension to decrease stress to affected areas. Pt tolerated session well without aggravation of symptoms.  Pt will benefit from continued skilled physical therapy services to decrease pain, improve strength and function.    Personal Factors and Comorbidities Comorbidity 3+;Age    Comorbidities Anemia, BPH, GERD, HTN, DM    Examination-Activity Limitations Dressing;Lift;Reach Overhead;Other   looking around   Stability/Clinical Decision Making Stable/Uncomplicated    Rehab Potential Fair    PT Frequency 2x / week    PT Duration 8 weeks    PT Treatment/Interventions Therapeutic activities;Therapeutic exercise;Neuromuscular re-education;Patient/family education;Manual techniques;Dry needling;Electrical Stimulation;Iontophoresis 4mg /ml Dexamethasone;Traction   traction if appropriate   PT Next Visit Plan posture, lower trap and anterior cervical muscle strengthening, decreasing cervical paraspinal, upper trap and scalene muscle tension, manual techniques, modalities PRN    PT Home Exercise Plan Medbridge Access Code: [email or web address]    Consulted and Agree with Plan of Care Patient             Patient will benefit from skilled therapeutic intervention in order to improve the following deficits and impairments:  Pain, Postural dysfunction, Improper body mechanics, Impaired UE functional use, Decreased strength, Decreased range of motion  Visit Diagnosis: Cervicalgia  Radiculopathy, cervical region     Problem List Patient Active Problem List   Diagnosis Date Noted   Neck pain 09/19/2022   Loose stools 01/13/2022   Family history of  colon cancer    Hx of adenomatous colonic polyps    Benign neoplasm of cecum    Benign neoplasm of transverse colon    Difficult airway for intubation 08/25/2021   Anemia 08/02/2021   Presence of Watchman left atrial appendage closure device 07/09/2021   Secondary hypercoagulable state (HCC) 06/12/2021   H. pylori infection 03/27/2021   Atrial fibrillation (HCC) 12/04/2020   Stool incontinence 04/23/2020   Caregiver stress 10/24/2019   Pelvic floor dysfunction 10/24/2019   Skin tag 10/18/2018   Healthcare maintenance 10/15/2018  Plantar fasciitis 10/15/2018   Diabetes mellitus without complication (HCC) 07/30/2018   BPH (benign prostatic hyperplasia) 12/23/2016   Lower GI bleed 12/22/2016   Advance care planning 07/16/2014   Hyperglycemia 07/16/2014   Skin lesion 02/27/2014   HTN (hypertension) 07/09/2013   Paresthesia 12/25/2012   NICM (nonischemic cardiomyopathy) (HCC) 09/26/2012   ED (erectile dysfunction) 05/26/2012   DOE (dyspnea on exertion) 06/18/2011   Medicare annual wellness visit, subsequent 05/26/2011   Hypercholesteremia 03/03/2011   CAD in native artery 10/14/2008   Gastroesophageal reflux disease 10/14/2008   DIVERTICULOSIS OF COLON 04/03/2008   Loralyn Freshwater PT, DPT  12/06/2022, 10:47 AM  [facility] [address], [place], [address] Phone: [phone]   Fax:  [phone]  Name: Paul Fry MRN: [id number] Date of Birth: 08/14/1945

## 2022-12-08 DIAGNOSIS — H26492 Other secondary cataract, left eye: Secondary | ICD-10-CM | POA: Diagnosis not present

## 2022-12-08 DIAGNOSIS — Z961 Presence of intraocular lens: Secondary | ICD-10-CM | POA: Diagnosis not present

## 2022-12-09 ENCOUNTER — Ambulatory Visit: Payer: Medicare Other | Attending: Neurosurgery

## 2022-12-09 DIAGNOSIS — M542 Cervicalgia: Secondary | ICD-10-CM | POA: Insufficient documentation

## 2022-12-09 DIAGNOSIS — M5412 Radiculopathy, cervical region: Secondary | ICD-10-CM | POA: Insufficient documentation

## 2022-12-09 NOTE — Therapy (Signed)
[facility] [address] [place], [place], [address] Phone: [phone]   Fax:  [phone]  Physical Therapy Treatment  Patient Details  Name: Paul Fry MRN: [id number] Date of Birth: 1945-09-15 Referring Provider (PT): Bedelia Person, MD   Encounter Date: 12/09/2022   PT End of Session - 12/09/22 1035     Visit Number 6    Number of Visits 17    Date for PT Re-Evaluation 01/21/23    PT Start Time 1035    PT Stop Time 1115    PT Time Calculation (min) 40 min    Activity Tolerance Patient tolerated treatment well    Behavior During Therapy [facility] for tasks assessed/performed                  Past Medical History:  Diagnosis Date   Allergy    SEASONAL   Anemia    Arthritis    KNEES   BPH (benign prostatic hyperplasia)    Cancer (HCC)    Clotting disorder (HCC)    Difficult airway for intubation 08/25/2021   Diverticulosis of colon    Elevated PSA    1.22 on 01-20-2016   Feeling of incomplete bladder emptying    GERD (gastroesophageal reflux disease)    Hiatal hernia    History of adenomatous polyp of colon    2014 tubular adenoma   History of chronic gastritis    History of Helicobacter pylori infection    07/ 2016   History of lower GI bleeding    07/ 2010 and 12/ 2011  diverticular bleed both times   Hyperlipidemia    Hypertension    Lower urinary tract symptoms (LUTS)    Nonischemic cardiomyopathy (HCC)    Plantar fascia syndrome    Presence of Watchman left atrial appendage closure device 07/09/2021   Watchman FLX 31mm with Dr. Lalla Brothers   Strains to urinate    INTERMITTANT   Type 2 diabetes, diet controlled (HCC)    ON BORDER LINE,NEVER TOOK MEDICATION.   Wears glasses     Past Surgical History:  Procedure Laterality Date   APPENDECTOMY  teenager   ATRIAL FIBRILLATION ABLATION N/A 05/15/2021   Procedure: ATRIAL FIBRILLATION ABLATION;  Surgeon: Lanier Prude, MD;  Location:  [facility] INVASIVE CV LAB;  Service: Cardiovascular;  Laterality: N/A;   BIOPSY  11/19/2021   Procedure: BIOPSY;  Surgeon: Meryl Dare, MD;  Location: [facility] ENDOSCOPY;  Service: Gastroenterology;;   CARDIAC CATHETERIZATION  09-10-2008   dr dalton mclean   mild non-obstructive CAD;  30-40% mRCA, 20%pLAD,  ef 55%   CARDIOVASCULAR STRESS TEST  07/29/2011   Low risk nuclear study w/ no ischemia or infarct/  LVEF 47% and  apical hypokinesis (08-07-2012 cardiac MRI -- EF 49% w/ no hyperenhancement distal septal and apical hypokinesis, mild LAE, mild RVE, mild AR with mild dilated sinus 39mm)   CATARACT EXTRACTION W/ INTRAOCULAR LENS  IMPLANT, BILATERAL  2016   COLONOSCOPY  last one 12-21-2012   COLONOSCOPY WITH PROPOFOL N/A 11/19/2021   Procedure: COLONOSCOPY WITH PROPOFOL;  Surgeon: Meryl Dare, MD;  Location: [facility];  Service: Gastroenterology;  Laterality: N/A;   CYSTOSCOPY WITH INSERTION OF UROLIFT N/A 06/17/2016   Procedure: CYSTOSCOPY WITH INSERTION OF UROLIFT;  Surgeon: Jethro Bolus, MD;  Location: [facility];  Service: Urology;  Laterality: N/A;   ESOPHAGOGASTRODUODENOSCOPY  last one 11-22-2014   EYE SURGERY     LEFT ATRIAL APPENDAGE  OCCLUSION N/A 07/09/2021   Procedure: LEFT ATRIAL APPENDAGE OCCLUSION;  Surgeon: Lanier Prude, MD;  Location: [facility] INVASIVE CV LAB;  Service: Cardiovascular;  Laterality: N/A;   POLYPECTOMY  11/19/2021   Procedure: POLYPECTOMY;  Surgeon: Meryl Dare, MD;  Location: [facility] ENDOSCOPY;  Service: Gastroenterology;;   TEE WITHOUT CARDIOVERSION N/A 07/09/2021   Procedure: TRANSESOPHAGEAL ECHOCARDIOGRAM (TEE);  Surgeon: Lanier Prude, MD;  Location: [facility] INVASIVE CV LAB;  Service: Cardiovascular;  Laterality: N/A;   TEE WITHOUT CARDIOVERSION N/A 08/20/2021   Procedure: TRANSESOPHAGEAL ECHOCARDIOGRAM (TEE);  Surgeon: Christell Constant, MD;  Location: [facility] ENDOSCOPY;  Service: Cardiovascular;  Laterality: N/A;   TEE WITHOUT  CARDIOVERSION N/A 01/08/2022   Procedure: TRANSESOPHAGEAL ECHOCARDIOGRAM (TEE);  Surgeon: Wendall Stade, MD;  Location: [facility] ENDOSCOPY;  Service: Cardiovascular;  Laterality: N/A;   TRANSTHORACIC ECHOCARDIOGRAM  09/17/2014   ef 50-55%/  trivial AR, MR and PR/  mild LAE and RAE/  mild TR    There were no vitals filed for this visit.                          PCP: Joaquim Nam, MD   REFERRING PROVIDER: Bedelia Person, MD   REFERRING DIAG: Cervical Radiculopathy, M54.12   THERAPY DIAG:  Cervicalgia  Radiculopathy, cervical region  Rationale for Evaluation and Treatment: Rehabilitation  ONSET DATE: 11/10/22 (date PT referral signed)    Rationale for Evaluation and Treatment Rehabilitation  PERTINENT HISTORY: Neck pain, mostly in R shoulder. Pain goes to R lateral neck. Pain started a couple of months ago, suddenly, no known mechanism of injury. Pt is R hand dominant. Has not yet had PT for current condition. Also has R UE symptoms along the R C5/C6 dermatome. Had an X-ray for neck and R shoulder. Had an MRI for R shoulder wich did not reveal anything. Pain is the same as onset a few months ago.   PRECAUTIONS: no known precuations     SUBJECTIVE:   SUBJECTIVE STATEMENT: Can't see a whole lot of difference. Still has a tingling sensation.    PAIN:  Are you having pain? See subjective   TODAY'S TREATMENT:                                                                                                                                         DATE: 12/09/2022     Blood pressure is controlled per pt.  No latex allergies  B cervical paraspinal muscle tension L > R, B upper trap and scalene muscle tension  ( - ) median nerve tension; (+) R radial nerve tension with reproduction of R lateral neck symptoms.      Manual therapy Seated caudal glide R first rib to promote movement  Decreased R upper trap pain reported.   Seated STM L upper  trap and cervical paraspinal muscles to decrease  tension. R lateral neck tingling which slowly eases with rest.   Seated STM L posterior lateral neck to improve fascial monility  Seated STM R pectoralis to decrease tension    Seated gentle manual cervical traction     Therapeutic exercise  Seated manually resisted scapular retraction targeting the lower trap muscles   R 10x2 with 5 second holds  Then 8x5 seconds. Incresed R lateral neck tingling   (+) Spurlings test R side  Decreased R lateral neck tingling with gentle cervical traction   Seated chin tucks 10x5 seconds for 2 sets  To promote thoracic extension and decrease stress to his neck      Improved exercise technique, movement at target joints, use of target muscles after mod to max verbal, visual, tactile cues.     Response to treatment Pt tolerated session well without aggravation of symptoms. No R lateral neck pain or tingling after session.    Clinical impression  Demonstrates positive spurling's test on R suggesting stenosis. Worked on improving intervertebral foramen space to decrease pressure to R cervical peripheral nerves. Pt tolerated session well without aggravation of symptoms.  Pt will benefit from continued skilled physical therapy services to decrease pain, improve strength and function.        PATIENT EDUCATION: Education details: there-ex, HEP Person educated: Patient Education method: Explanation, Demonstration, Tactile cues, and Verbal cues, handout Education comprehension: verbalized understanding and returned demonstration  HOME EXERCISE PROGRAM:  Access Code: [email or web address] URL: [email or web address] Date: 11/22/2022 Prepared by: Loralyn Freshwater  Exercises - Supine Deep Neck Flexor Nods  - 1 x daily - 7 x weekly - 3 sets - 10 reps - 5 seconds hold - Seated Scapular Retraction  - 1 x daily - 7 x weekly - 3 sets - 10 reps - 5 seconds hold  - Single Arm Doorway Pec Stretch  at 90 Degrees Abduction  - 3 x daily - 7 x weekly - 1 sets - 3 reps - 30 seconds hold   With strap on L first rib  Cervical rotation    R 10x3    Increased R lateral neck tingling. Slowly eases with rest. Decreases with increased repetition   L 10x3  With strap on R first rib  Cervical rotation    R 10x3    R lateral neck tingling which decreased with increased repetition   L 10x3   - Supine Shoulder Flexion Extension Full Range AROM  - 1 x daily - 7 x weekly - 3 sets - 10 reps - 5 seconds hold  - Seated Cervical Retraction  - 2-3 x daily - 7 x weekly - 3 sets - 10 reps - 5 seconds hold                           PT Short Term Goals - 11/22/22 1622       PT SHORT TERM GOAL #1   Title Pt will be independent with his initial HEP to decrease pain, improve strength, function, and ability to reach, as well as look around more comfortably.    Baseline Pt has started his initial HEP (11/22/2022)    Time 3    Period Weeks    Status New    Target Date 12/17/22               PT Long Term Goals - 11/22/22 1623       PT LONG TERM GOAL #1   Title  Pt will have a decrease in neck and R UE pain to 3/10 or less at worst to promote ability to reach as well as look around more comfortably.    Baseline 9/10 R lateral neck and upper trap area pain at worst for the past 3 months (11/22/2022)    Time 8    Period Weeks    Status New    Target Date 01/21/23      PT LONG TERM GOAL #2   Title Pt will improve B lower trap strength by at least 1/2 MMT grade to help decrease upper trap and scalene muscle tension, improve ability to look around as well as reach with his R arm more comfortably.    Baseline Seated manually resisted scapular retraction targeting the lower trap 3+/5 R and L (11/22/2022)    Time 8    Period Weeks    Status New    Target Date 01/21/23      PT LONG TERM GOAL #3   Title Pt will improve his neck FOTO score by at least 10 points as a  demonstration of improved function.    Baseline Neck FOTO 67 (11/22/2022)    Time 8    Period Weeks    Status New    Target Date 01/21/23                    Plan - 12/09/22 1034     Clinical Impression Statement Demonstrates positive spurling's test on R suggesting stenosis. Worked on improving intervertebral foramen space to decrease pressure to R cervical peripheral nerves. Pt tolerated session well without aggravation of symptoms.  Pt will benefit from continued skilled physical therapy services to decrease pain, improve strength and function.    Personal Factors and Comorbidities Comorbidity 3+;Age    Comorbidities Anemia, BPH, GERD, HTN, DM    Examination-Activity Limitations Dressing;Lift;Reach Overhead;Other   looking around   Stability/Clinical Decision Making Stable/Uncomplicated    Rehab Potential Fair    PT Frequency 2x / week    PT Duration 8 weeks    PT Treatment/Interventions Therapeutic activities;Therapeutic exercise;Neuromuscular re-education;Patient/family education;Manual techniques;Dry needling;Electrical Stimulation;Iontophoresis 4mg /ml Dexamethasone;Traction   traction if appropriate   PT Next Visit Plan posture, lower trap and anterior cervical muscle strengthening, decreasing cervical paraspinal, upper trap and scalene muscle tension, manual techniques, modalities PRN    PT Home Exercise Plan Medbridge Access Code: [email or web address]    Consulted and Agree with Plan of Care Patient             Patient will benefit from skilled therapeutic intervention in order to improve the following deficits and impairments:  Pain, Postural dysfunction, Improper body mechanics, Impaired UE functional use, Decreased strength, Decreased range of motion  Visit Diagnosis: Cervicalgia  Radiculopathy, cervical region     Problem List Patient Active Problem List   Diagnosis Date Noted   Neck pain 09/19/2022   Loose stools 01/13/2022   Family history of colon cancer     Hx of adenomatous colonic polyps    Benign neoplasm of cecum    Benign neoplasm of transverse colon    Difficult airway for intubation 08/25/2021   Anemia 08/02/2021   Presence of Watchman left atrial appendage closure device 07/09/2021   Secondary hypercoagulable state (HCC) 06/12/2021   H. pylori infection 03/27/2021   Atrial fibrillation (HCC) 12/04/2020   Stool incontinence 04/23/2020   Caregiver stress 10/24/2019   Pelvic floor dysfunction 10/24/2019   Skin tag 10/18/2018   Healthcare maintenance 10/15/2018  Plantar fasciitis 10/15/2018   Diabetes mellitus without complication (HCC) 07/30/2018   BPH (benign prostatic hyperplasia) 12/23/2016   Lower GI bleed 12/22/2016   Advance care planning 07/16/2014   Hyperglycemia 07/16/2014   Skin lesion 02/27/2014   HTN (hypertension) 07/09/2013   Paresthesia 12/25/2012   NICM (nonischemic cardiomyopathy) (HCC) 09/26/2012   ED (erectile dysfunction) 05/26/2012   DOE (dyspnea on exertion) 06/18/2011   Medicare annual wellness visit, subsequent 05/26/2011   Hypercholesteremia 03/03/2011   CAD in native artery 10/14/2008   Gastroesophageal reflux disease 10/14/2008   DIVERTICULOSIS OF COLON 04/03/2008   Loralyn Freshwater PT, DPT  12/09/2022, 12:30 PM  [facility] [address], [place], [address] Phone: [phone]   Fax:  [phone]  Name: Paul Fry MRN: [id number] Date of Birth: 11/25/1945

## 2022-12-10 ENCOUNTER — Ambulatory Visit: Payer: Medicare Other

## 2022-12-12 ENCOUNTER — Telehealth: Payer: Self-pay | Admitting: Family Medicine

## 2022-12-12 NOTE — Telephone Encounter (Signed)
Please update patient that I verified with cardiology that it was appropriate to be off Plavix.  Thanks.

## 2022-12-13 NOTE — Telephone Encounter (Signed)
Patient notified that its okay to be off plavix. Patient verbalized understanding.

## 2022-12-14 ENCOUNTER — Ambulatory Visit: Payer: Medicare Other | Attending: Neurosurgery

## 2022-12-14 DIAGNOSIS — M5412 Radiculopathy, cervical region: Secondary | ICD-10-CM | POA: Insufficient documentation

## 2022-12-14 DIAGNOSIS — M542 Cervicalgia: Secondary | ICD-10-CM | POA: Diagnosis not present

## 2022-12-14 NOTE — Therapy (Signed)
[facility] [address] [place], [place], [address] Phone: [phone]   Fax:  [phone]  Physical Therapy Treatment  Patient Details  Name: Paul Fry MRN: [id number] Date of Birth: 30-Oct-1945 Referring Provider (PT): Bedelia Person, MD   Encounter Date: 12/14/2022   PT End of Session - 12/14/22 0819     Visit Number 7    Number of Visits 17    Date for PT Re-Evaluation 01/21/23    PT Start Time 0820    PT Stop Time 0858    PT Time Calculation (min) 38 min    Activity Tolerance Patient tolerated treatment well    Behavior During Therapy [facility] for tasks assessed/performed                   Past Medical History:  Diagnosis Date   Allergy    SEASONAL   Anemia    Arthritis    KNEES   BPH (benign prostatic hyperplasia)    Cancer (HCC)    Clotting disorder (HCC)    Difficult airway for intubation 08/25/2021   Diverticulosis of colon    Elevated PSA    1.22 on 01-20-2016   Feeling of incomplete bladder emptying    GERD (gastroesophageal reflux disease)    Hiatal hernia    History of adenomatous polyp of colon    2014 tubular adenoma   History of chronic gastritis    History of Helicobacter pylori infection    07/ 2016   History of lower GI bleeding    07/ 2010 and 12/ 2011  diverticular bleed both times   Hyperlipidemia    Hypertension    Lower urinary tract symptoms (LUTS)    Nonischemic cardiomyopathy (HCC)    Plantar fascia syndrome    Presence of Watchman left atrial appendage closure device 07/09/2021   Watchman FLX 31mm with Dr. Lalla Brothers   Strains to urinate    INTERMITTANT   Type 2 diabetes, diet controlled (HCC)    ON BORDER LINE,NEVER TOOK MEDICATION.   Wears glasses     Past Surgical History:  Procedure Laterality Date   APPENDECTOMY  teenager   ATRIAL FIBRILLATION ABLATION N/A 05/15/2021   Procedure: ATRIAL FIBRILLATION ABLATION;  Surgeon: Lanier Prude, MD;   Location: [facility];  Service: Cardiovascular;  Laterality: N/A;   BIOPSY  11/19/2021   Procedure: BIOPSY;  Surgeon: Meryl Dare, MD;  Location: [facility] ENDOSCOPY;  Service: Gastroenterology;;   CARDIAC CATHETERIZATION  09-10-2008   dr dalton mclean   mild non-obstructive CAD;  30-40% mRCA, 20%pLAD,  ef 55%   CARDIOVASCULAR STRESS TEST  07/29/2011   Low risk nuclear study w/ no ischemia or infarct/  LVEF 47% and  apical hypokinesis (08-07-2012 cardiac MRI -- EF 49% w/ no hyperenhancement distal septal and apical hypokinesis, mild LAE, mild RVE, mild AR with mild dilated sinus 39mm)   CATARACT EXTRACTION W/ INTRAOCULAR LENS  IMPLANT, BILATERAL  2016   COLONOSCOPY  last one 12-21-2012   COLONOSCOPY WITH PROPOFOL N/A 11/19/2021   Procedure: COLONOSCOPY WITH PROPOFOL;  Surgeon: Meryl Dare, MD;  Location: [facility];  Service: Gastroenterology;  Laterality: N/A;   CYSTOSCOPY WITH INSERTION OF UROLIFT N/A 06/17/2016   Procedure: CYSTOSCOPY WITH INSERTION OF UROLIFT;  Surgeon: Jethro Bolus, MD;  Location: [facility];  Service: Urology;  Laterality: N/A;   ESOPHAGOGASTRODUODENOSCOPY  last one 11-22-2014   EYE SURGERY     LEFT ATRIAL  APPENDAGE OCCLUSION N/A 07/09/2021   Procedure: LEFT ATRIAL APPENDAGE OCCLUSION;  Surgeon: Lanier Prude, MD;  Location: [facility];  Service: Cardiovascular;  Laterality: N/A;   POLYPECTOMY  11/19/2021   Procedure: POLYPECTOMY;  Surgeon: Meryl Dare, MD;  Location: [facility];  Service: Gastroenterology;;   TEE WITHOUT CARDIOVERSION N/A 07/09/2021   Procedure: TRANSESOPHAGEAL ECHOCARDIOGRAM (TEE);  Surgeon: Lanier Prude, MD;  Location: [facility] INVASIVE CV LAB;  Service: Cardiovascular;  Laterality: N/A;   TEE WITHOUT CARDIOVERSION N/A 08/20/2021   Procedure: TRANSESOPHAGEAL ECHOCARDIOGRAM (TEE);  Surgeon: Christell Constant, MD;  Location: [facility];  Service: Cardiovascular;  Laterality: N/A;   TEE  WITHOUT CARDIOVERSION N/A 01/08/2022   Procedure: TRANSESOPHAGEAL ECHOCARDIOGRAM (TEE);  Surgeon: Wendall Stade, MD;  Location: [facility] ENDOSCOPY;  Service: Cardiovascular;  Laterality: N/A;   TRANSTHORACIC ECHOCARDIOGRAM  09/17/2014   ef 50-55%/  trivial AR, MR and PR/  mild LAE and RAE/  mild TR    There were no vitals filed for this visit.                          PCP: Joaquim Nam, MD   REFERRING PROVIDER: Bedelia Person, MD   REFERRING DIAG: Cervical Radiculopathy, M54.12   THERAPY DIAG:  Cervicalgia  Radiculopathy, cervical region  Rationale for Evaluation and Treatment: Rehabilitation  ONSET DATE: 11/10/22 (date PT referral signed)    Rationale for Evaluation and Treatment Rehabilitation  PERTINENT HISTORY: Neck pain, mostly in R shoulder. Pain goes to R lateral neck. Pain started a couple of months ago, suddenly, no known mechanism of injury. Pt is R hand dominant. Has not yet had PT for current condition. Also has R UE symptoms along the R C5/C6 dermatome. Had an X-ray for neck and R shoulder. Had an MRI for R shoulder wich did not reveal anything. Pain is the same as onset a few months ago.   PRECAUTIONS: no known precuations     SUBJECTIVE:   SUBJECTIVE STATEMENT: Has R lateral neck tingling, eased up just now. No pain currently.    PAIN:  Are you having pain? See subjective   TODAY'S TREATMENT:                                                                                                                                         DATE: 12/14/2022     Blood pressure is controlled per pt.  No latex allergies  B cervical paraspinal muscle tension L > R, B upper trap and scalene muscle tension  ( - ) median nerve tension; (+) R radial nerve tension with reproduction of R lateral neck symptoms.      Manual therapy  Seated gentle manual cervical traction     Therapeutic exercise  Pt education on R scalene, neck posture  and R lateral neck nerves  Seated chin  tucks 10x5 seconds for 3 sets  To promote thoracic extension and decrease stress to his neck   Seated B scapular retraction while performing shoulder flexion to 90 degrees  R 10x3  Standing R pectoralis stretch at doorway 30 seconds x 3  Seated R first rib stretch with strap 60 seconds x 2    Improved exercise technique, movement at target joints, use of target muscles after mod to max verbal, visual, tactile cues.     Response to treatment Pt tolerated session well without aggravation of symptoms. No R lateral neck pain or tingling after session.    Clinical impression   Able to perform R shoulder flexion to 90 degrees without R lateral neck tingling after cues to prevent R shoulder shrug and scapular protraction. No R lateral neck symptoms after session. Pt will benefit from continued skilled physical therapy services to decrease pain, improve strength and function.        PATIENT EDUCATION: Education details: there-ex, HEP Person educated: Patient Education method: Explanation, Demonstration, Tactile cues, and Verbal cues, handout Education comprehension: verbalized understanding and returned demonstration  HOME EXERCISE PROGRAM:  Access Code: [email or web address] URL: [email or web address] Date: 11/22/2022 Prepared by: Loralyn Freshwater  Exercises - Supine Deep Neck Flexor Nods  - 1 x daily - 7 x weekly - 3 sets - 10 reps - 5 seconds hold - Seated Scapular Retraction  - 1 x daily - 7 x weekly - 3 sets - 10 reps - 5 seconds hold  - Single Arm Doorway Pec Stretch at 90 Degrees Abduction  - 3 x daily - 7 x weekly - 1 sets - 3 reps - 30 seconds hold   With strap on L first rib  Cervical rotation    R 10x3    Increased R lateral neck tingling. Slowly eases with rest. Decreases with increased repetition   L 10x3  With strap on R first rib  Cervical rotation    R 10x3    R lateral neck tingling which decreased with  increased repetition   L 10x3   - Supine Shoulder Flexion Extension Full Range AROM  - 1 x daily - 7 x weekly - 3 sets - 10 reps - 5 seconds hold  - Seated Cervical Retraction  - 2-3 x daily - 7 x weekly - 3 sets - 10 reps - 5 seconds hold  - Seated Single Arm Shoulder Flexion  - 1 x daily - 7 x weekly - 3 sets - 10 reps                         PT Short Term Goals - 11/22/22 1622       PT SHORT TERM GOAL #1   Title Pt will be independent with his initial HEP to decrease pain, improve strength, function, and ability to reach, as well as look around more comfortably.    Baseline Pt has started his initial HEP (11/22/2022)    Time 3    Period Weeks    Status New    Target Date 12/17/22               PT Long Term Goals - 11/22/22 1623       PT LONG TERM GOAL #1   Title Pt will have a decrease in neck and R UE pain to 3/10 or less at worst to promote ability to reach as well as look around more comfortably.    Baseline 9/10 R  lateral neck and upper trap area pain at worst for the past 3 months (11/22/2022)    Time 8    Period Weeks    Status New    Target Date 01/21/23      PT LONG TERM GOAL #2   Title Pt will improve B lower trap strength by at least 1/2 MMT grade to help decrease upper trap and scalene muscle tension, improve ability to look around as well as reach with his R arm more comfortably.    Baseline Seated manually resisted scapular retraction targeting the lower trap 3+/5 R and L (11/22/2022)    Time 8    Period Weeks    Status New    Target Date 01/21/23      PT LONG TERM GOAL #3   Title Pt will improve his neck FOTO score by at least 10 points as a demonstration of improved function.    Baseline Neck FOTO 67 (11/22/2022)    Time 8    Period Weeks    Status New    Target Date 01/21/23                    Plan - 12/14/22 0819     Clinical Impression Statement Able to perform R shoulder flexion to 90 degrees without R  lateral neck tingling after cues to prevent R shoulder shrug and scapular protraction. No R lateral neck symptoms after session. Pt will benefit from continued skilled physical therapy services to decrease pain, improve strength and function.    Personal Factors and Comorbidities Comorbidity 3+;Age    Comorbidities Anemia, BPH, GERD, HTN, DM    Examination-Activity Limitations Dressing;Lift;Reach Overhead;Other   looking around   Stability/Clinical Decision Making Stable/Uncomplicated    Rehab Potential Fair    PT Frequency 2x / week    PT Duration 8 weeks    PT Treatment/Interventions Therapeutic activities;Therapeutic exercise;Neuromuscular re-education;Patient/family education;Manual techniques;Dry needling;Electrical Stimulation;Iontophoresis 4mg /ml Dexamethasone;Traction   traction if appropriate   PT Next Visit Plan posture, lower trap and anterior cervical muscle strengthening, decreasing cervical paraspinal, upper trap and scalene muscle tension, manual techniques, modalities PRN    PT Home Exercise Plan Medbridge Access Code: [email or web address]    Consulted and Agree with Plan of Care Patient             Patient will benefit from skilled therapeutic intervention in order to improve the following deficits and impairments:  Pain, Postural dysfunction, Improper body mechanics, Impaired UE functional use, Decreased strength, Decreased range of motion  Visit Diagnosis: Cervicalgia  Radiculopathy, cervical region     Problem List Patient Active Problem List   Diagnosis Date Noted   Neck pain 09/19/2022   Loose stools 01/13/2022   Family history of colon cancer    Hx of adenomatous colonic polyps    Benign neoplasm of cecum    Benign neoplasm of transverse colon    Difficult airway for intubation 08/25/2021   Anemia 08/02/2021   Presence of Watchman left atrial appendage closure device 07/09/2021   Secondary hypercoagulable state (HCC) 06/12/2021   H. pylori infection 03/27/2021    Atrial fibrillation (HCC) 12/04/2020   Stool incontinence 04/23/2020   Caregiver stress 10/24/2019   Pelvic floor dysfunction 10/24/2019   Skin tag 10/18/2018   Healthcare maintenance 10/15/2018   Plantar fasciitis 10/15/2018   Diabetes mellitus without complication (HCC) 07/30/2018   BPH (benign prostatic hyperplasia) 12/23/2016   Lower GI bleed 12/22/2016   Advance care planning 07/16/2014   Hyperglycemia  07/16/2014   Skin lesion 02/27/2014   HTN (hypertension) 07/09/2013   Paresthesia 12/25/2012   NICM (nonischemic cardiomyopathy) (HCC) 09/26/2012   ED (erectile dysfunction) 05/26/2012   DOE (dyspnea on exertion) 06/18/2011   Medicare annual wellness visit, subsequent 05/26/2011   Hypercholesteremia 03/03/2011   CAD in native artery 10/14/2008   Gastroesophageal reflux disease 10/14/2008   DIVERTICULOSIS OF COLON 04/03/2008   Loralyn Freshwater PT, DPT  12/14/2022, 10:14 AM  [facility] [address] [place], [address] Phone: [phone]   Fax:  [phone]  Name: ALVAREZ PACCIONE MRN: [id number] Date of Birth: [birth year]

## 2022-12-16 ENCOUNTER — Ambulatory Visit: Payer: Medicare Other

## 2022-12-16 DIAGNOSIS — M542 Cervicalgia: Secondary | ICD-10-CM | POA: Diagnosis not present

## 2022-12-16 DIAGNOSIS — M5412 Radiculopathy, cervical region: Secondary | ICD-10-CM

## 2022-12-16 NOTE — Therapy (Signed)
[facility] [address] [place], [place], [address] Phone: [phone]   Fax:  [phone]  Physical Therapy Treatment  Patient Details  Name: Paul Fry MRN: [id number] Date of Birth: [birth year] Referring Provider (PT): Bedelia Person, MD   Encounter Date: 12/16/2022   PT End of Session - 12/16/22 0858     Visit Number 8    Number of Visits 17    Date for PT Re-Evaluation 01/21/23    PT Start Time 0859    PT Stop Time 0944    PT Time Calculation (min) 45 min    Activity Tolerance Patient tolerated treatment well    Behavior During Therapy [facility] for tasks assessed/performed                    Past Medical History:  Diagnosis Date   Allergy    SEASONAL   Anemia    Arthritis    KNEES   BPH (benign prostatic hyperplasia)    Cancer (HCC)    Clotting disorder (HCC)    Difficult airway for intubation 08/25/2021   Diverticulosis of colon    Elevated PSA    1.22 on 01-20-2016   Feeling of incomplete bladder emptying    GERD (gastroesophageal reflux disease)    Hiatal hernia    History of adenomatous polyp of colon    2014 tubular adenoma   History of chronic gastritis    History of Helicobacter pylori infection    07/ 2016   History of lower GI bleeding    07/ 2010 and 12/ 2011  diverticular bleed both times   Hyperlipidemia    Hypertension    Lower urinary tract symptoms (LUTS)    Nonischemic cardiomyopathy (HCC)    Plantar fascia syndrome    Presence of Watchman left atrial appendage closure device 07/09/2021   Watchman FLX 31mm with Dr. Lalla Brothers   Strains to urinate    INTERMITTANT   Type 2 diabetes, diet controlled (HCC)    ON BORDER LINE,NEVER TOOK MEDICATION.   Wears glasses     Past Surgical History:  Procedure Laterality Date   APPENDECTOMY  teenager   ATRIAL FIBRILLATION ABLATION N/A 05/15/2021   Procedure: ATRIAL FIBRILLATION ABLATION;  Surgeon: Lanier Prude, MD;   Location: [facility];  Service: Cardiovascular;  Laterality: N/A;   BIOPSY  11/19/2021   Procedure: BIOPSY;  Surgeon: Meryl Dare, MD;  Location: [facility]OS[facility];  Service: Gastroenterology;;   CARDIAC CATHETERIZATION  09-10-2008   dr dalton mclean   mild non-obstructive CAD;  30-40% mRCA, 20%pLAD,  ef 55%   CARDIOVASCULAR STRESS TEST  07/29/2011   Low risk nuclear study w/ no ischemia or infarct/  LVEF 47% and  apical hypokinesis (08-07-2012 cardiac MRI -- EF 49% w/ no hyperenhancement distal septal and apical hypokinesis, mild LAE, mild RVE, mild AR with mild dilated sinus 39mm)   CATARACT EXTRACTION W/ INTRAOCULAR LENS  IMPLANT, BILATERAL  2016   COLONOSCOPY  last one 12-21-2012   COLONOSCOPY WITH PROPOFOL N/A 11/19/2021   Procedure: COLONOSCOPY WITH PROPOFOL;  Surgeon: Meryl Dare, MD;  Location: [facility];  Service: Gastroenterology;  Laterality: N/A;   CYSTOSCOPY WITH INSERTION OF UROLIFT N/A 06/17/2016   Procedure: CYSTOSCOPY WITH INSERTION OF UROLIFT;  Surgeon: Jethro Bolus, MD;  Location: [facility];  Service: Urology;  Laterality: N/A;   ESOPHAGOGASTRODUODENOSCOPY  last one 11-22-2014   EYE SURGERY     LEFT  ATRIAL APPENDAGE OCCLUSION N/A 07/09/2021   Procedure: LEFT ATRIAL APPENDAGE OCCLUSION;  Surgeon: Lanier Prude, MD;  Location: [facility];  Service: Cardiovascular;  Laterality: N/A;   POLYPECTOMY  11/19/2021   Procedure: POLYPECTOMY;  Surgeon: Meryl Dare, MD;  Location: [facility]OS[facility];  Service: Gastroenterology;;   TEE WITHOUT CARDIOVERSION N/A 07/09/2021   Procedure: TRANSESOPHAGEAL ECHOCARDIOGRAM (TEE);  Surgeon: Lanier Prude, MD;  Location: [facility] INVASIVE CV LAB;  Service: Cardiovascular;  Laterality: N/A;   TEE WITHOUT CARDIOVERSION N/A 08/20/2021   Procedure: TRANSESOPHAGEAL ECHOCARDIOGRAM (TEE);  Surgeon: Christell Constant, MD;  Location: [facility] ENDOSCOPY;  Service: Cardiovascular;  Laterality: N/A;   TEE  WITHOUT CARDIOVERSION N/A 01/08/2022   Procedure: TRANSESOPHAGEAL ECHOCARDIOGRAM (TEE);  Surgeon: Wendall Stade, MD;  Location: [facility] ENDOSCOPY;  Service: Cardiovascular;  Laterality: N/A;   TRANSTHORACIC ECHOCARDIOGRAM  09/17/2014   ef 50-55%/  trivial AR, MR and PR/  mild LAE and RAE/  mild TR    There were no vitals filed for this visit.                          PCP: Joaquim Nam, MD   REFERRING PROVIDER: Bedelia Person, MD   REFERRING DIAG: Cervical Radiculopathy, M54.12   THERAPY DIAG:  Cervicalgia  Radiculopathy, cervical region  Rationale for Evaluation and Treatment: Rehabilitation  ONSET DATE: 11/10/22 (date PT referral signed)    Rationale for Evaluation and Treatment Rehabilitation  PERTINENT HISTORY: Neck pain, mostly in R shoulder. Pain goes to R lateral neck. Pain started a couple of months ago, suddenly, no known mechanism of injury. Pt is R hand dominant. Has not yet had PT for current condition. Also has R UE symptoms along the R C5/C6 dermatome. Had an X-ray for neck and R shoulder. Had an MRI for R shoulder wich did not reveal anything. Pain is the same as onset a few months ago.   PRECAUTIONS: no known precuations     SUBJECTIVE:   SUBJECTIVE STATEMENT: R lateral neck is pretty good. No pain right now. Neck pain has kind of eased of a little bit after last session. Not as bad.    PAIN:  Are you having pain? See subjective   TODAY'S TREATMENT:                                                                                                                                         DATE: 12/16/2022     Blood pressure is controlled per pt.  No latex allergies  B cervical paraspinal muscle tension L > R, B upper trap and scalene muscle tension  ( - ) median nerve tension; (+) R radial nerve tension with reproduction of R lateral neck symptoms.      Manual therapy   Seated R caudal glide first rib grade 3- to 3 to  improve mobility   Seated gentle manual cervical traction to decrease pressure to R cervical peripheral nerves.     Therapeutic exercise   Seated chin tucks 10x5 seconds for 3 sets  To promote thoracic extension and decrease stress to his neck   Seated B scapular retraction while performing shoulder flexion to 90 degrees  R 10x3   Then with scaption 10x3 to 90 degrees    Then with abduction to 90 degrees 10x3    Standing R shoulder extension with scapular retraction red band 10x3 with 5 second holds   Standing R pectoralis stretch at doorway 30 seconds x 3   Seated R first rib stretch with strap 60 seconds x 2     Improved exercise technique, movement at target joints, use of target muscles after mod to max verbal, visual, tactile cues.     Response to treatment Pt tolerated session well without aggravation of symptoms.   Clinical impression  Improving R lateral neck paresthesia based on subjective reports. Continued working on R shoulder AROM with scapular control to help decrease over activation of R scalene and upper trap muscles and therefore decrease pressure to R cervical peripheral nerves as well.  Pt tolerated session well without aggravation of symptoms. Pt will benefit from continued skilled physical therapy services to decrease pain, improve strength and function.        PATIENT EDUCATION: Education details: there-ex, HEP Person educated: Patient Education method: Explanation, Demonstration, Tactile cues, and Verbal cues, handout Education comprehension: verbalized understanding and returned demonstration  HOME EXERCISE PROGRAM:  Access Code: [email or web address] URL: [email or web address] Date: 11/22/2022 Prepared by: Loralyn Freshwater  Exercises - Supine Deep Neck Flexor Nods  - 1 x daily - 7 x weekly - 3 sets - 10 reps - 5 seconds hold - Seated Scapular Retraction  - 1 x daily - 7 x weekly - 3 sets - 10 reps - 5 seconds hold  - Single Arm  Doorway Pec Stretch at 90 Degrees Abduction  - 3 x daily - 7 x weekly - 1 sets - 3 reps - 30 seconds hold   With strap on L first rib  Cervical rotation    R 10x3    Increased R lateral neck tingling. Slowly eases with rest. Decreases with increased repetition   L 10x3  With strap on R first rib  Cervical rotation    R 10x3    R lateral neck tingling which decreased with increased repetition   L 10x3   - Supine Shoulder Flexion Extension Full Range AROM  - 1 x daily - 7 x weekly - 3 sets - 10 reps - 5 seconds hold  - Seated Cervical Retraction  - 2-3 x daily - 7 x weekly - 3 sets - 10 reps - 5 seconds hold  - Seated Single Arm Shoulder Flexion  - 1 x daily - 7 x weekly - 3 sets - 10 reps                         PT Short Term Goals - 11/22/22 1622       PT SHORT TERM GOAL #1   Title Pt will be independent with his initial HEP to decrease pain, improve strength, function, and ability to reach, as well as look around more comfortably.    Baseline Pt has started his initial HEP (11/22/2022)    Time 3    Period Weeks    Status New  Target Date 12/17/22               PT Long Term Goals - 11/22/22 1623       PT LONG TERM GOAL #1   Title Pt will have a decrease in neck and R UE pain to 3/10 or less at worst to promote ability to reach as well as look around more comfortably.    Baseline 9/10 R lateral neck and upper trap area pain at worst for the past 3 months (11/22/2022)    Time 8    Period Weeks    Status New    Target Date 01/21/23      PT LONG TERM GOAL #2   Title Pt will improve B lower trap strength by at least 1/2 MMT grade to help decrease upper trap and scalene muscle tension, improve ability to look around as well as reach with his R arm more comfortably.    Baseline Seated manually resisted scapular retraction targeting the lower trap 3+/5 R and L (11/22/2022)    Time 8    Period Weeks    Status New    Target Date 01/21/23      PT  LONG TERM GOAL #3   Title Pt will improve his neck FOTO score by at least 10 points as a demonstration of improved function.    Baseline Neck FOTO 67 (11/22/2022)    Time 8    Period Weeks    Status New    Target Date 01/21/23                    Plan - 12/16/22 0857     Clinical Impression Statement Improving R lateral neck paresthesia based on subjective reports. Continued working on R shoulder AROM with scapular control to help decrease over activation of R scalene and upper trap muscles and therefore decrease pressure to R cervical peripheral nerves as well.   Pt tolerated session well without aggravation of symptoms. Pt will benefit from continued skilled physical therapy services to decrease pain, improve strength and function.    Personal Factors and Comorbidities Comorbidity 3+;Age    Comorbidities Anemia, BPH, GERD, HTN, DM    Examination-Activity Limitations Dressing;Lift;Reach Overhead;Other   looking around   Stability/Clinical Decision Making Stable/Uncomplicated    Rehab Potential Fair    PT Frequency 2x / week    PT Duration 8 weeks    PT Treatment/Interventions Therapeutic activities;Therapeutic exercise;Neuromuscular re-education;Patient/family education;Manual techniques;Dry needling;Electrical Stimulation;Iontophoresis 4mg /ml Dexamethasone;Traction   traction if appropriate   PT Next Visit Plan posture, lower trap and anterior cervical muscle strengthening, decreasing cervical paraspinal, upper trap and scalene muscle tension, manual techniques, modalities PRN    PT Home Exercise Plan Medbridge Access Code: [email or web address]    Consulted and Agree with Plan of Care Patient             Patient will benefit from skilled therapeutic intervention in order to improve the following deficits and impairments:  Pain, Postural dysfunction, Improper body mechanics, Impaired UE functional use, Decreased strength, Decreased range of motion  Visit  Diagnosis: Cervicalgia  Radiculopathy, cervical region     Problem List Patient Active Problem List   Diagnosis Date Noted   Neck pain 09/19/2022   Loose stools 01/13/2022   Family history of colon cancer    Hx of adenomatous colonic polyps    Benign neoplasm of cecum    Benign neoplasm of transverse colon    Difficult airway for intubation 08/25/2021  Anemia 08/02/2021   Presence of Watchman left atrial appendage closure device 07/09/2021   Secondary hypercoagulable state (HCC) 06/12/2021   H. pylori infection 03/27/2021   Atrial fibrillation (HCC) 12/04/2020   Stool incontinence 04/23/2020   Caregiver stress 10/24/2019   Pelvic floor dysfunction 10/24/2019   Skin tag 10/18/2018   Healthcare maintenance 10/15/2018   Plantar fasciitis 10/15/2018   Diabetes mellitus without complication (HCC) 07/30/2018   BPH (benign prostatic hyperplasia) 12/23/2016   Lower GI bleed 12/22/2016   Advance care planning 07/16/2014   Hyperglycemia 07/16/2014   Skin lesion 02/27/2014   HTN (hypertension) 07/09/2013   Paresthesia 12/25/2012   NICM (nonischemic cardiomyopathy) (HCC) 09/26/2012   ED (erectile dysfunction) 05/26/2012   DOE (dyspnea on exertion) 06/18/2011   Medicare annual wellness visit, subsequent 05/26/2011   Hypercholesteremia 03/03/2011   CAD in native artery 10/14/2008   Gastroesophageal reflux disease 10/14/2008   DIVERTICULOSIS OF COLON 04/03/2008   Loralyn Freshwater PT, DPT  12/16/2022, 10:51 AM  [facility] [address] [place], [address] Phone: [phone]   Fax:  [phone]  Name: Paul Fry MRN: [id number] Date of Birth: [birth year]

## 2022-12-20 ENCOUNTER — Encounter: Payer: Self-pay | Admitting: Family Medicine

## 2022-12-21 ENCOUNTER — Ambulatory Visit: Payer: Medicare Other

## 2022-12-21 DIAGNOSIS — M5412 Radiculopathy, cervical region: Secondary | ICD-10-CM | POA: Diagnosis not present

## 2022-12-21 DIAGNOSIS — M542 Cervicalgia: Secondary | ICD-10-CM | POA: Diagnosis not present

## 2022-12-21 NOTE — Therapy (Signed)
[facility] [address] [place], [place], [address] Phone: [phone]   Fax:  [phone]  Physical Therapy Treatment  Patient Details  Name: Paul Fry MRN: [id number] Date of Birth: 28-Jun-1945 Referring Provider (PT): Bedelia Person, MD   Encounter Date: 12/21/2022   PT End of Session - 12/21/22 1357     Visit Number 9    Number of Visits 17    Date for PT Re-Evaluation 01/21/23    PT Start Time 1352    PT Stop Time 1430    PT Time Calculation (min) 38 min    Activity Tolerance Patient tolerated treatment well    Behavior During Therapy [facility] for tasks assessed/performed                    Past Medical History:  Diagnosis Date   Allergy    SEASONAL   Anemia    Arthritis    KNEES   BPH (benign prostatic hyperplasia)    Cancer (HCC)    Clotting disorder (HCC)    Difficult airway for intubation 08/25/2021   Diverticulosis of colon    Elevated PSA    1.22 on 01-20-2016   Feeling of incomplete bladder emptying    GERD (gastroesophageal reflux disease)    Hiatal hernia    History of adenomatous polyp of colon    2014 tubular adenoma   History of chronic gastritis    History of Helicobacter pylori infection    07/ 2016   History of lower GI bleeding    07/ 2010 and 12/ 2011  diverticular bleed both times   Hyperlipidemia    Hypertension    Lower urinary tract symptoms (LUTS)    Nonischemic cardiomyopathy (HCC)    Plantar fascia syndrome    Presence of Watchman left atrial appendage closure device 07/09/2021   Watchman FLX 31mm with Dr. Lalla Brothers   Strains to urinate    INTERMITTANT   Type 2 diabetes, diet controlled (HCC)    ON BORDER LINE,NEVER TOOK MEDICATION.   Wears glasses     Past Surgical History:  Procedure Laterality Date   APPENDECTOMY  teenager   ATRIAL FIBRILLATION ABLATION N/A 05/15/2021   Procedure: ATRIAL FIBRILLATION ABLATION;  Surgeon: Lanier Prude, MD;   [facility];  Service: Cardiovascular;  Laterality: N/A;   BIOPSY  11/19/2021   Procedure: BIOPSY;  Surgeon: Meryl Dare, MD;  Location: [facility] ENDOS[facility];  Service: Gastroenterology;;   CARDIAC CATHETERIZATION  09-10-2008   dr dalton mclean   mild non-obstructive CAD;  30-40% mRCA, 20%pLAD,  ef 55%   CARDIOVASCULAR STRESS TEST  07/29/2011   Low risk nuclear study w/ no ischemia or infarct/  LVEF 47% and  apical hypokinesis (08-07-2012 cardiac MRI -- EF 49% w/ no hyperenhancement distal septal and apical hypokinesis, mild LAE, mild RVE, mild AR with mild dilated sinus 39mm)   CATARACT EXTRACTION W/ INTRAOCULAR LENS  IMPLANT, BILATERAL  2016   COLONOSCOPY  last one 12-21-2012   COLONOSCOPY WITH PROPOFOL N/A 11/19/2021   Procedure: COLONOSCOPY WITH PROPOFOL;  Surgeon: Meryl Dare, MD;  Location: [facility];  Service: Gastroenterology;  Laterality: N/A;   CYSTOSCOPY WITH INSERTION OF UROLIFT N/A 06/17/2016   Procedure: CYSTOSCOPY WITH INSERTION OF UROLIFT;  Surgeon: Jethro Bolus, MD;  Location: [facility];  Service: Urology;  Laterality: N/A;   ESOPHAGOGASTRODUODENOSCOPY  last one 11-22-2014   EYE SURGERY     LEFT  ATRIAL APPENDAGE OCCLUSION N/A 07/09/2021   Procedure: LEFT ATRIAL APPENDAGE OCCLUSION;  Surgeon: Lanier Prude, MD;  [facility];  Service: Cardiovascular;  Laterality: N/A;   POLYPECTOMY  11/19/2021   Procedure: POLYPECTOMY;  Surgeon: Meryl Dare, MD;  [facility];  Service: Gastroenterology;;   TEE WITHOUT CARDIOVERSION N/A 07/09/2021   Procedure: TRANSESOPHAGEAL ECHOCARDIOGRAM (TEE);  Surgeon: Lanier Prude, MD;  Location: [facility] INVASIVE CV LAB;  Service: Cardiovascular;  Laterality: N/A;   TEE WITHOUT CARDIOVERSION N/A 08/20/2021   Procedure: TRANSESOPHAGEAL ECHOCARDIOGRAM (TEE);  Surgeon: Christell Constant, MD;  Location: [facility] ENDOSCOPY;  Service: Cardiovascular;  Laterality: N/A;   TEE  WITHOUT CARDIOVERSION N/A 01/08/2022   Procedure: TRANSESOPHAGEAL ECHOCARDIOGRAM (TEE);  Surgeon: Wendall Stade, MD;  Location: [facility] ENDOSCOPY;  Service: Cardiovascular;  Laterality: N/A;   TRANSTHORACIC ECHOCARDIOGRAM  09/17/2014   ef 50-55%/  trivial AR, MR and PR/  mild LAE and RAE/  mild TR    There were no vitals filed for this visit.     PCP: Joaquim Nam, MD   REFERRING PROVIDER: Bedelia Person, MD   REFERRING DIAG: Cervical Radiculopathy, M54.12   THERAPY DIAG:  Cervicalgia  Radiculopathy, cervical region  Rationale for Evaluation and Treatment: Rehabilitation  ONSET DATE: 11/10/22 (date PT referral signed)    Rationale for Evaluation and Treatment Rehabilitation  PERTINENT HISTORY: Neck pain, mostly in R shoulder. Pain goes to R lateral neck. Pain started a couple of months ago, suddenly, no known mechanism of injury. Pt is R hand dominant. Has not yet had PT for current condition. Also has R UE symptoms along the R C5/C6 dermatome. Had an X-ray for neck and R shoulder. Had an MRI for R shoulder wich did not reveal anything. Pain is the same as onset a few months ago.   PRECAUTIONS: no known precuations     SUBJECTIVE:   SUBJECTIVE STATEMENT: R lateral neck is pretty good. No pain right now. Neck pain has kind of eased of a little bit after last session. Not as bad.    PAIN: Are you having pain? See subjective   TODAY'S TREATMENT: DATE: 12/21/22  Blood pressure is controlled per pt.  No latex allergies  B cervical paraspinal muscle tension L > R, B upper trap and scalene muscle tension  ( - ) median nerve tension; (+) R radial nerve tension with reproduction of R lateral neck symptoms.    Manual therapy   Supine R caudal glide first rib grade 3- to 3 to improve mobility  Supine STM to cervical region to increase extensibility of the paraspinals Supine suboccipital release technique to decrease cervicalgia Supine manual traction performed  in order to increase joint space in cervical region for pain relief Supine cervical upglides/downglides, 30 sec bouts Supine UT/Levator stretch, 30 sec bouts to increase tissue extensibility of the cervical region ULLT1 testing performed and pt found to be positive   TherEx:  Median Nerve Flossing, x10 with tray method Median Nerve Flossing, x10 with standard method listed in HEP    Improved exercise technique, movement at target joints, use of target muscles after mod to max verbal, visual, tactile cues.     Response to treatment Pt tolerated session well without aggravation of symptoms.     Clinical impression  Pt responded well to the manual therapy approach and noted a reduction in overall pain levels, along with increased ROM of the neck.  Pt was introduced to median nerve glides, as  pt was assessed for ULTT1 and found to be positive.  Pt given these as part of HEP and encouraged to perform at home to see if any improvement is made over the next several weeks.   Pt will continue to benefit from skilled therapy to address remaining deficits in order to improve overall QoL and return to PLOF.          PATIENT EDUCATION: Education details: there-ex, HEP Person educated: Patient Education method: Explanation, Demonstration, Tactile cues, and Verbal cues, handout Education comprehension: verbalized understanding and returned demonstration  HOME EXERCISE PROGRAM:  Access Code: [email or web address] URL: [email or web address] Date: 11/22/2022 Prepared by: Loralyn Freshwater  Exercises - Supine Deep Neck Flexor Nods  - 1 x daily - 7 x weekly - 3 sets - 10 reps - 5 seconds hold - Seated Scapular Retraction  - 1 x daily - 7 x weekly - 3 sets - 10 reps - 5 seconds hold  - Single Arm Doorway Pec Stretch at 90 Degrees Abduction  - 3 x daily - 7 x weekly - 1 sets - 3 reps - 30 seconds hold - Median Nerve Flossing - Tray  - 1 x daily - 7 x weekly - 3 sets - 10 reps - Median Nerve  Flossing  - 1 x daily - 7 x weekly - 3 sets - 10 reps  With strap on L first rib  Cervical rotation    R 10x3    Increased R lateral neck tingling. Slowly eases with rest. Decreases with increased repetition   L 10x3  With strap on R first rib  Cervical rotation    R 10x3    R lateral neck tingling which decreased with increased repetition   L 10x3   - Supine Shoulder Flexion Extension Full Range AROM  - 1 x daily - 7 x weekly - 3 sets - 10 reps - 5 seconds hold  - Seated Cervical Retraction  - 2-3 x daily - 7 x weekly - 3 sets - 10 reps - 5 seconds hold  - Seated Single Arm Shoulder Flexion  - 1 x daily - 7 x weekly - 3 sets - 10 reps      PT Short Term Goals - 11/22/22 1622       PT SHORT TERM GOAL #1   Title Pt will be independent with his initial HEP to decrease pain, improve strength, function, and ability to reach, as well as look around more comfortably.    Baseline Pt has started his initial HEP (11/22/2022)    Time 3    Period Weeks    Status New    Target Date 12/17/22               PT Long Term Goals - 11/22/22 1623       PT LONG TERM GOAL #1   Title Pt will have a decrease in neck and R UE pain to 3/10 or less at worst to promote ability to reach as well as look around more comfortably.    Baseline 9/10 R lateral neck and upper trap area pain at worst for the past 3 months (11/22/2022)    Time 8    Period Weeks    Status New    Target Date 01/21/23      PT LONG TERM GOAL #2   Title Pt will improve B lower trap strength by at least 1/2 MMT grade to help decrease upper trap and scalene muscle tension, improve ability to  look around as well as reach with his R arm more comfortably.    Baseline Seated manually resisted scapular retraction targeting the lower trap 3+/5 R and L (11/22/2022)    Time 8    Period Weeks    Status New    Target Date 01/21/23      PT LONG TERM GOAL #3   Title Pt will improve his neck FOTO score by at least 10 points as a  demonstration of improved function.    Baseline Neck FOTO 67 (11/22/2022)    Time 8    Period Weeks    Status New    Target Date 01/21/23                      Patient will benefit from skilled therapeutic intervention in order to improve the following deficits and impairments:     Visit Diagnosis: Cervicalgia  Radiculopathy, cervical region     Problem List Patient Active Problem List   Diagnosis Date Noted   Neck pain 09/19/2022   Loose stools 01/13/2022   Family history of colon cancer    Hx of adenomatous colonic polyps    Benign neoplasm of cecum    Benign neoplasm of transverse colon    Difficult airway for intubation 08/25/2021   Anemia 08/02/2021   Presence of Watchman left atrial appendage closure device 07/09/2021   Secondary hypercoagulable state (HCC) 06/12/2021   H. pylori infection 03/27/2021   Atrial fibrillation (HCC) 12/04/2020   Stool incontinence 04/23/2020   Caregiver stress 10/24/2019   Pelvic floor dysfunction 10/24/2019   Skin tag 10/18/2018   Healthcare maintenance 10/15/2018   Plantar fasciitis 10/15/2018   Diabetes mellitus without complication (HCC) 07/30/2018   BPH (benign prostatic hyperplasia) 12/23/2016   Lower GI bleed 12/22/2016   Advance care planning 07/16/2014   Hyperglycemia 07/16/2014   Skin lesion 02/27/2014   HTN (hypertension) 07/09/2013   Paresthesia 12/25/2012   NICM (nonischemic cardiomyopathy) (HCC) 09/26/2012   ED (erectile dysfunction) 05/26/2012   DOE (dyspnea on exertion) 06/18/2011   Medicare annual wellness visit, subsequent 05/26/2011   Hypercholesteremia 03/03/2011   CAD in native artery 10/14/2008   Gastroesophageal reflux disease 10/14/2008   DIVERTICULOSIS OF COLON 04/03/2008    Nolon Bussing, PT, DPT Physical Therapist - [facility]  12/21/22, 4:49 PM   [place]  [facility] [address],  [place], [address] Phone: [phone]   Fax:  [phone]  Name: Paul Fry MRN: [id number] Date of Birth: [birth year]

## 2022-12-22 ENCOUNTER — Other Ambulatory Visit: Payer: Self-pay | Admitting: Family Medicine

## 2022-12-22 DIAGNOSIS — I429 Cardiomyopathy, unspecified: Secondary | ICD-10-CM

## 2022-12-22 DIAGNOSIS — E7800 Pure hypercholesterolemia, unspecified: Secondary | ICD-10-CM

## 2022-12-22 MED ORDER — ATORVASTATIN CALCIUM 20 MG PO TABS
10.0000 mg | ORAL_TABLET | Freq: Every morning | ORAL | Status: DC
Start: 2022-12-22 — End: 2023-03-07

## 2022-12-23 ENCOUNTER — Ambulatory Visit: Payer: Medicare Other

## 2022-12-23 DIAGNOSIS — M5412 Radiculopathy, cervical region: Secondary | ICD-10-CM

## 2022-12-23 DIAGNOSIS — M542 Cervicalgia: Secondary | ICD-10-CM

## 2022-12-23 NOTE — Therapy (Signed)
[facility] [address] [place], [place], [address] Phone: [phone]   Fax:  [phone]  Physical Therapy Treatment  Patient Details  Name: Paul Fry MRN: [id number] Date of Birth: [birth year] Referring Provider (PT): Bedelia Person, MD   Encounter Date: 12/23/2022   PT End of Session - 12/23/22 0823     Visit Number 10    Number of Visits 17    Date for PT Re-Evaluation 01/21/23    PT Start Time 0819    PT Stop Time 0835    PT Time Calculation (min) 16 min    Activity Tolerance Patient tolerated treatment well    Behavior During Therapy [facility] for tasks assessed/performed                    Past Medical History:  Diagnosis Date   Allergy    SEASONAL   Anemia    Arthritis    KNEES   BPH (benign prostatic hyperplasia)    Cancer (HCC)    Clotting disorder (HCC)    Difficult airway for intubation 08/25/2021   Diverticulosis of colon    Elevated PSA    1.22 on 01-20-2016   Feeling of incomplete bladder emptying    GERD (gastroesophageal reflux disease)    Hiatal hernia    History of adenomatous polyp of colon    2014 tubular adenoma   History of chronic gastritis    History of Helicobacter pylori infection    07/ 2016   History of lower GI bleeding    07/ 2010 and 12/ 2011  diverticular bleed both times   Hyperlipidemia    Hypertension    Lower urinary tract symptoms (LUTS)    Nonischemic cardiomyopathy (HCC)    Plantar fascia syndrome    Presence of Watchman left atrial appendage closure device 07/09/2021   Watchman FLX 31mm with Dr. Lalla Brothers   Strains to urinate    INTERMITTANT   Type 2 diabetes, diet controlled (HCC)    ON BORDER LINE,NEVER TOOK MEDICATION.   Wears glasses     Past Surgical History:  Procedure Laterality Date   APPENDECTOMY  teenager   ATRIAL FIBRILLATION ABLATION N/A 05/15/2021   Procedure: ATRIAL FIBRILLATION ABLATION;  Surgeon: Lanier Prude, MD;   [facility];  Service: Cardiovascular;  Laterality: N/A;   BIOPSY  11/19/2021   Procedure: BIOPSY;  Surgeon: Meryl Dare, MD;  Location: [facility];  Service: Gastroenterology;;   CARDIAC CATHETERIZATION  09-10-2008   dr dalton mclean   mild non-obstructive CAD;  30-40% mRCA, 20%pLAD,  ef 55%   CARDIOVASCULAR STRESS TEST  07/29/2011   Low risk nuclear study w/ no ischemia or infarct/  LVEF 47% and  apical hypokinesis (08-07-2012 cardiac MRI -- EF 49% w/ no hyperenhancement distal septal and apical hypokinesis, mild LAE, mild RVE, mild AR with mild dilated sinus 39mm)   CATARACT EXTRACTION W/ INTRAOCULAR LENS  IMPLANT, BILATERAL  2016   COLONOSCOPY  last one 12-21-2012   COLONOSCOPY WITH PROPOFOL N/A 11/19/2021   Procedure: COLONOSCOPY WITH PROPOFOL;  Surgeon: Meryl Dare, MD;  Location: [facility];  Service: Gastroenterology;  Laterality: N/A;   CYSTOSCOPY WITH INSERTION OF UROLIFT N/A 06/17/2016   Procedure: CYSTOSCOPY WITH INSERTION OF UROLIFT;  Surgeon: Jethro Bolus, MD;  Location[facility];  Service: Urology;  Laterality: N/A;   ESOPHAGOGASTRODUODENOSCOPY  last one 11-22-2014   EYE SURGERY     LEFT  ATRIAL APPENDAGE OCCLUSION N/A 07/09/2021   Procedure: LEFT ATRIAL APPENDAGE OCCLUSION;  Surgeon: Lanier Prude, MD;  [facility];  Service: Cardiovascular;  Laterality: N/A;   POLYPECTOMY  11/19/2021   Procedure: POLYPECTOMY;  Surgeon: Meryl Dare, MD;  Location: [facility];  Service: Gastroenterology;;   TEE WITHOUT CARDIOVERSION N/A 07/09/2021   Procedure: TRANSESOPHAGEAL ECHOCARDIOGRAM (TEE);  Surgeon: Lanier Prude, MD;  Location: [facility] INVASIVE CV LAB;  Service: Cardiovascular;  Laterality: N/A;   TEE WITHOUT CARDIOVERSION N/A 08/20/2021   Procedure: TRANSESOPHAGEAL ECHOCARDIOGRAM (TEE);  Surgeon: Christell Constant, MD;  Location: [facility] ENDOSCOPY;  Service: Cardiovascular;  Laterality: N/A;   TEE  WITHOUT CARDIOVERSION N/A 01/08/2022   Procedure: TRANSESOPHAGEAL ECHOCARDIOGRAM (TEE);  Surgeon: Wendall Stade, MD;  Location: [facility] ENDOSCOPY;  Service: Cardiovascular;  Laterality: N/A;   TRANSTHORACIC ECHOCARDIOGRAM  09/17/2014   ef 50-55%/  trivial AR, MR and PR/  mild LAE and RAE/  mild TR    There were no vitals filed for this visit.     PCP: Joaquim Nam, MD   REFERRING PROVIDER: Bedelia Person, MD   REFERRING DIAG: Cervical Radiculopathy, M54.12   THERAPY DIAG:  Cervicalgia  Radiculopathy, cervical region  Rationale for Evaluation and Treatment: Rehabilitation  ONSET DATE: 11/10/22 (date PT referral signed)    Rationale for Evaluation and Treatment Rehabilitation  PERTINENT HISTORY: Neck pain, mostly in R shoulder. Pain goes to R lateral neck. Pain started a couple of months ago, suddenly, no known mechanism of injury. Pt is R hand dominant. Has not yet had PT for current condition. Also has R UE symptoms along the R C5/C6 dermatome. Had an X-ray for neck and R shoulder. Had an MRI for R shoulder wich did not reveal anything. Pain is the same as onset a few months ago.   PRECAUTIONS: no known precuations     SUBJECTIVE:   SUBJECTIVE STATEMENT: Pt reports that he feels as though this whole ordeal may be due to arthritis.  Pt states that when he goes into a well air conditioned room, it really sets off the pain that he is feeling.   PAIN: Are you having pain? See subjective   TODAY'S TREATMENT: DATE: 12/23/22  Blood pressure is controlled per pt.  No latex allergies  B cervical paraspinal muscle tension L > R, B upper trap and scalene muscle tension  ( - ) median nerve tension; (+) R radial nerve tension with reproduction of R lateral neck symptoms.    TherAct:  Goal assessment performed and noted below:     Improved exercise technique, movement at target joints, use of target muscles after mod to max verbal, visual, tactile cues.      Response to treatment Pt tolerated session well without aggravation of symptoms.     Clinical impression  Therapist and pt discussed current plan of care due to it being tenth visit and the progress that has been made thus far in therapy.  Pt supports that he has not felt much of a difference since initial evaluation and feels as though therapy is not helping at this time.  Pt advised to contact physician for further steps and would benefit from being seen in the future.  Pt agreeable to this plan and discharge at this time.  Pt has regressed on 2/3 goals and has maintained the same strength of the remaining goal.  Pt is discharged at this time.        PATIENT EDUCATION: Education  details: there-ex, HEP Person educated: Patient Education method: Explanation, Demonstration, Tactile cues, and Verbal cues, handout Education comprehension: verbalized understanding and returned demonstration  HOME EXERCISE PROGRAM:  Access Code: [email or web address] URL: [email or web address] Date: 11/22/2022 Prepared by: Loralyn Freshwater  Exercises - Supine Deep Neck Flexor Nods  - 1 x daily - 7 x weekly - 3 sets - 10 reps - 5 seconds hold - Seated Scapular Retraction  - 1 x daily - 7 x weekly - 3 sets - 10 reps - 5 seconds hold  - Single Arm Doorway Pec Stretch at 90 Degrees Abduction  - 3 x daily - 7 x weekly - 1 sets - 3 reps - 30 seconds hold - Median Nerve Flossing - Tray  - 1 x daily - 7 x weekly - 3 sets - 10 reps - Median Nerve Flossing  - 1 x daily - 7 x weekly - 3 sets - 10 reps  With strap on L first rib  Cervical rotation    R 10x3    Increased R lateral neck tingling. Slowly eases with rest. Decreases with increased repetition   L 10x3  With strap on R first rib  Cervical rotation    R 10x3    R lateral neck tingling which decreased with increased repetition   L 10x3   - Supine Shoulder Flexion Extension Full Range AROM  - 1 x daily - 7 x weekly - 3 sets - 10 reps - 5  seconds hold  - Seated Cervical Retraction  - 2-3 x daily - 7 x weekly - 3 sets - 10 reps - 5 seconds hold  - Seated Single Arm Shoulder Flexion  - 1 x daily - 7 x weekly - 3 sets - 10 reps      PT Short Term Goals - 11/22/22 1622       PT SHORT TERM GOAL #1   Title Pt will be independent with his initial HEP to decrease pain, improve strength, function, and ability to reach, as well as look around more comfortably.    Baseline Pt has started his initial HEP (11/22/2022)    Time 3    Period Weeks    Status New    Target Date 12/17/22               PT Long Term Goals - 11/22/22 1623       PT LONG TERM GOAL #1   Title Pt will have a decrease in neck and R UE pain to 3/10 or less at worst to promote ability to reach as well as look around more comfortably.    Baseline 9/10 R lateral neck and upper trap area pain at worst for the past 3 months (11/22/2022)  12/23/22: 8-9/10   Time 8    Period Weeks    Status Not Progressing    Target Date 01/21/23      PT LONG TERM GOAL #2   Title Pt will improve B lower trap strength by at least 1/2 MMT grade to help decrease upper trap and scalene muscle tension, improve ability to look around as well as reach with his R arm more comfortably.    Baseline Seated manually resisted scapular retraction targeting the lower trap 3+/5 R and L (11/22/2022)  12/23/22: 3+/5 in lower trap bilaterally   Time 8    Period Weeks    Status New    Target Date 01/21/23      PT LONG TERM GOAL #3  Title Pt will improve his neck FOTO score by at least 10 points as a demonstration of improved function.    Baseline Neck FOTO 67 (11/22/2022)  12/23/22: 59    Time 8    Period Weeks    Status Not Progressing    Target Date 01/21/23                      Patient will benefit from skilled therapeutic intervention in order to improve the following deficits and impairments:     Visit Diagnosis: Cervicalgia  Radiculopathy, cervical  region     Problem List Patient Active Problem List   Diagnosis Date Noted   Neck pain 09/19/2022   Loose stools 01/13/2022   Family history of colon cancer    Hx of adenomatous colonic polyps    Benign neoplasm of cecum    Benign neoplasm of transverse colon    Difficult airway for intubation 08/25/2021   Anemia 08/02/2021   Presence of Watchman left atrial appendage closure device 07/09/2021   Secondary hypercoagulable state (HCC) 06/12/2021   H. pylori infection 03/27/2021   Atrial fibrillation (HCC) 12/04/2020   Stool incontinence 04/23/2020   Caregiver stress 10/24/2019   Pelvic floor dysfunction 10/24/2019   Skin tag 10/18/2018   Healthcare maintenance 10/15/2018   Plantar fasciitis 10/15/2018   Diabetes mellitus without complication (HCC) 07/30/2018   BPH (benign prostatic hyperplasia) 12/23/2016   Lower GI bleed 12/22/2016   Advance care planning 07/16/2014   Hyperglycemia 07/16/2014   Skin lesion 02/27/2014   HTN (hypertension) 07/09/2013   Paresthesia 12/25/2012   NICM (nonischemic cardiomyopathy) (HCC) 09/26/2012   ED (erectile dysfunction) 05/26/2012   DOE (dyspnea on exertion) 06/18/2011   Medicare annual wellness visit, subsequent 05/26/2011   Hypercholesteremia 03/03/2011   CAD in native artery 10/14/2008   Gastroesophageal reflux disease 10/14/2008   DIVERTICULOSIS OF COLON 04/03/2008    Nolon Bussing, PT, DPT Physical Therapist - [facility]  12/23/22, 8:56 AM   [place] [facility] [address], [place], [address] Phone: [phone]   Fax:  [phone]  Name: Paul Fry MRN: [id number] Date of Birth: 10/19/1945

## 2022-12-27 DIAGNOSIS — D471 Chronic myeloproliferative disease: Secondary | ICD-10-CM | POA: Diagnosis not present

## 2022-12-27 DIAGNOSIS — D7581 Myelofibrosis: Secondary | ICD-10-CM | POA: Insufficient documentation

## 2023-01-11 DIAGNOSIS — D471 Chronic myeloproliferative disease: Secondary | ICD-10-CM | POA: Diagnosis not present

## 2023-01-14 DIAGNOSIS — M4712 Other spondylosis with myelopathy, cervical region: Secondary | ICD-10-CM | POA: Diagnosis not present

## 2023-01-14 DIAGNOSIS — Z6826 Body mass index (BMI) 26.0-26.9, adult: Secondary | ICD-10-CM | POA: Diagnosis not present

## 2023-01-14 DIAGNOSIS — M5412 Radiculopathy, cervical region: Secondary | ICD-10-CM | POA: Diagnosis not present

## 2023-01-19 ENCOUNTER — Ambulatory Visit: Payer: Medicare Other | Admitting: Cardiology

## 2023-01-19 NOTE — Progress Notes (Unsigned)
Cardiology Office Note Date:  01/19/2023  Patient ID:  Paul Fry February 20, 1946, MRN [id number] PCP:  Joaquim Nam, MD  Cardiologist:  Donato Schultz, MD Electrophysiologist: Lanier Prude, MD  Chief Complaint:  3 mon PAC follow-up  History of Present Illness: Paul Fry is a 77 y.o. male with PMH notable for afib/flutter s/p ablation, GI bleed 2/2 anticoag, s/p LAAO (watchman 07/2021), CAD, T2DM, HFpEF; seen today for Lanier Prude, MD for routine electrophysiology followup.  He is s/p LAAO by Dr. Lalla Brothers 05/2021. Initial follow-up TEE showed small device leak. Follow-up TEE continued to show small leak, so plavix continued.  He saw NP Julien Girt 07/2022, EKG at that visit showed bigeminal PACs, so zio ordered that showed >5% PAC burden. His toprol was increased to 75mg  daily. I saw him in follow-up where he did not tolerate 75mg  toprol daily and self-lowered to 50mg  daily d/t DOE with some associated chest pressure, L shoulder/neck tingling. CTA recommended to eval chest pressure which showed non-obs CAD.  On follow-up today, he has been diagnosed with myelofibrosis. He continues to have DOE with exertional activities - like walking on steep incline. He monitors his pulse at home using watch, most readings are in the 60-80. It has woken him up at night with a reading in the 40s. It has also noted pulse in the 150s, but this was a very short duration. He had no sensation of palpitations when pulse was this high. He is tolerating 75mg  toprol daily with splitting 50mg  in AM, 25mg  in PM. Overall has good energy.     Past Medical History:  Diagnosis Date   Allergy    SEASONAL   Anemia    Arthritis    KNEES   BPH (benign prostatic hyperplasia)    Cancer (HCC)    Clotting disorder (HCC)    Difficult airway for intubation 08/25/2021   Diverticulosis of colon    Elevated PSA    1.22 on 01-20-2016   Feeling of incomplete bladder emptying    GERD (gastroesophageal reflux  disease)    Hiatal hernia    History of adenomatous polyp of colon    2014 tubular adenoma   History of chronic gastritis    History of Helicobacter pylori infection    07/ 2016   History of lower GI bleeding    07/ 2010 and 12/ 2011  diverticular bleed both times   Hyperlipidemia    Hypertension    Lower urinary tract symptoms (LUTS)    Nonischemic cardiomyopathy (HCC)    Plantar fascia syndrome    Presence of Watchman left atrial appendage closure device 07/09/2021   Watchman FLX 31mm with Dr. Lalla Brothers   Strains to urinate    INTERMITTANT   Type 2 diabetes, diet controlled (HCC)    ON BORDER LINE,NEVER TOOK MEDICATION.   Wears glasses     Past Surgical History:  Procedure Laterality Date   APPENDECTOMY  teenager   ATRIAL FIBRILLATION ABLATION N/A 05/15/2021   Procedure: ATRIAL FIBRILLATION ABLATION;  Surgeon: Lanier Prude, MD;  [facility];  Service: Cardiovascular;  Laterality: N/A;   BIOPSY  11/19/2021   Procedure: BIOPSY;  Surgeon: Meryl Dare, MD;  Location: [facility];  Service: Gastroenterology;;   CARDIAC CATHETERIZATION  09-10-2008   dr dalton mclean   mild non-obstructive CAD;  30-40% mRCA, 20%pLAD,  ef 55%   CARDIOVASCULAR STRESS TEST  07/29/2011   Low risk nuclear study w/ no ischemia or  infarct/  LVEF 47% and  apical hypokinesis (08-07-2012 cardiac MRI -- EF 49% w/ no hyperenhancement distal septal and apical hypokinesis, mild LAE, mild RVE, mild AR with mild dilated sinus 39mm)   CATARACT EXTRACTION W/ INTRAOCULAR LENS  IMPLANT, BILATERAL  2016   COLONOSCOPY  last one 12-21-2012   COLONOSCOPY WITH PROPOFOL N/A 11/19/2021   Procedure: COLONOSCOPY WITH PROPOFOL;  Surgeon: Meryl Dare, MD;  Location: [facility];  Service: Gastroenterology;  Laterality: N/A;   CYSTOSCOPY WITH INSERTION OF UROLIFT N/A 06/17/2016   Procedure: CYSTOSCOPY WITH INSERTION OF UROLIFT;  Surgeon: Jethro Bolus, MD;  [facility]: [facility];  Service: Urology;  Laterality: N/A;   ESOPHAGOGASTRODUODENOSCOPY  last one 11-22-2014   EYE SURGERY     LEFT ATRIAL APPENDAGE OCCLUSION N/A 07/09/2021   Procedure: LEFT ATRIAL APPENDAGE OCCLUSION;  Surgeon: Lanier Prude, MD;  [facility]: [facility];  Service: Cardiovascular;  Laterality: N/A;   POLYPECTOMY  11/19/2021   Procedure: POLYPECTOMY;  Surgeon: Meryl Dare, MD;  Location: [facility];  Service: Gastroenterology;;   TEE WITHOUT CARDIOVERSION N/A 07/09/2021   Procedure: TRANSESOPHAGEAL ECHOCARDIOGRAM (TEE);  Surgeon: Lanier Prude, MD;  Location: [facility] CV LAB;  Service: Cardiovascular;  Laterality: N/A;   TEE WITHOUT CARDIOVERSION N/A 08/20/2021   Procedure: TRANSESOPHAGEAL ECHOCARDIOGRAM (TEE);  Surgeon: Christell Constant, MD;  Location: [facility];  Service: Cardiovascular;  Laterality: N/A;   TEE WITHOUT CARDIOVERSION N/A 01/08/2022   Procedure: TRANSESOPHAGEAL ECHOCARDIOGRAM (TEE);  Surgeon: Wendall Stade, MD;  Location: [facility] END[facility];  Service: Cardiovascular;  Laterality: N/A;   TRANSTHORACIC ECHOCARDIOGRAM  09/17/2014   ef 50-55%/  trivial AR, MR and PR/  mild LAE and RAE/  mild TR    Current Outpatient Medications  Medication Instructions   ascorbic acid (VITAMIN C) 1,000 mg, Oral, Every morning   atorvastatin (LIPITOR) 10 mg, Oral, Every morning   loperamide (IMODIUM A-D) 2 mg, Oral, Daily PRN   losartan (COZAAR) 25 MG tablet TAKE 1/2 TABLET EVERY MORNING   metoprolol succinate (TOPROL-XL) 50 MG 24 hr tablet TAKE 1 TABLET IN THE MORNING. TAKE 1/2 TABLET IN THE EVENING. TAKE WITH OR IMMEDIATELY FOLLOWING A MEAL.   Multiple Minerals-Vitamins (CALCIUM 600+D3 PLUS MINERALS) TABS 1 tablet, Oral, Every morning   Multiple Vitamin (MULTIVITAMIN WITH MINERALS) TABS tablet 1 tablet, Oral, Daily, Centrum    Omega-3 Fatty Acids (FISH OIL PO) 1 capsule, Oral, Every morning   tamsulosin (FLOMAX) 0.8 mg, Oral, Daily    Social History:  The  patient  reports that he has never smoked. He has never used smokeless tobacco. He reports that he does not currently use alcohol. He reports that he does not use drugs.   Family History:  The patient's family history includes Colon cancer (age of onset: 6) in his mother; Colon polyps in his brother; Heart disease in his sister; Hypertension in his sister and sister; Kidney disease in his sister; Lung cancer in his brother.  ROS:  Please see the history of present illness. All other systems are reviewed and otherwise negative.   PHYSICAL EXAM:  VS:  BP 120/60 (BP Location: Left Arm, Patient Position: Sitting, Cuff Size: Normal)   Pulse 61   Ht 6' (1.829 m)   Wt 195 lb 6.4 oz (88.6 kg)   SpO2 98%   BMI 26.50 kg/m  BMI: Body mass index is 26.5 kg/m.  GEN- The patient is well appearing, alert and oriented x 3 today.   Lungs-  Clear to ausculation bilaterally, normal work of breathing.  Heart- Regular rate and rhythm, no murmurs, rubs or gallops Extremities- No peripheral edema, warm, dry  EKG is ordered. Personal review of EKG from today shows:  EKG Interpretation Date/Time:  Thursday January 20 2023 10:33:38 EDT Ventricular Rate:  61 PR Interval:  186 QRS Duration:  138 QT Interval:  440 QTC Calculation: 442 R Axis:   -17  Text Interpretation: Sinus rhythm with Premature atrial complexes Right bundle branch block Minimal voltage criteria for LVH, may be normal variant ( R in aVL ) Confirmed by Sherie Don [phone]) on 01/20/2023 10:35:49 AM    Recent Labs: 07/26/2022: Magnesium 2.2 11/29/2022: ALT 21; BUN 12; Creatinine 0.84; Hemoglobin 10.3; Platelet Count 260; Potassium 4.2; Sodium 138  11/26/2022: Cholesterol 111; HDL 25; LDL Cholesterol (Calc) 65; Total CHOL/HDL Ratio 4.4; Triglycerides 129   CrCl cannot be calculated (Patient's most recent lab result is older than the maximum 21 days allowed.).   Wt Readings from Last 3 Encounters:  12/03/22 196 lb (88.9 kg)  11/09/22 197  lb (89.4 kg)  10/06/22 199 lb 4.8 oz (90.4 kg)     Additional studies reviewed include: Previous EP, cardiology notes.   Coronary CTA, 09/09/2022 1. Coronary calcium score of 895. This was 75th percentile for age and sex matched control.  2. Normal coronary origin with right dominance.  3. Mild proximal LAD stenosis (25%).  4. Minimal proximal RCA stenosis (<25%).  5. CAD-RADS 2. Mild non-obstructive CAD (25-49%). Consider preventive therapy and risk factor modification.  6. Watchman device noted in place with no evidence for leak.  Long term monitor, 07/26/2022   Sinus Rhythm avg HR of 68 bpm   Very brief atrial flutter at 2am with variable conduction (<1% burden) - 5 min with avg rate 96 bpm   Freqent PAC's 5.5%, Rare PVC's   Post Watchman  TEE, 01/08/2022  1. Current study no further migration 1 cm mitral shoulder and persistent lateral lead 0.324 cm with average compression 17%.   2. Prior TEE 08/20/21 showed migration of device and compression only 8% Small 0.185 cm lateral leak.   3. Left ventricular ejection fraction, by estimation, is 55 to 60%. The left ventricle has normal function.   4. Right ventricular systolic function is normal. The right ventricular size is normal.   5. Left atrial size was moderately dilated. No left atrial/left atrial appendage thrombus was detected.   6. Right atrial size was moderately dilated.   7. The mitral valve is abnormal. Mild mitral valve regurgitation.   8. The aortic valve is tricuspid. There is mild calcification of the aortic valve. Aortic valve regurgitation is mild. Aortic valve sclerosis is present, with no evidence of aortic valve stenosis.   Myoview spect, 11/05/2021   The study is normal. The study is low risk.   No ST deviation was noted.   LV perfusion is normal. There is no evidence of ischemia. There is no evidence of infarction.   Left ventricular function is normal. Nuclear stress EF: 54 %. The left ventricular ejection fraction  is mildly decreased (45-54%). End diastolic cavity size is normal. End systolic cavity size is normal.   Prior study available for comparison from 07/30/2011.   Normal resting and stress perfusion. No ischemia or infarction EF 54%  TEE, 08/20/2021  1. Follow up from Watchman 31 mm FLX device. There is proximal migration of the device within the left atrial appendage. There is a small, 1.85 mm, peri-device  leak. There is an increased mitral shoulder of the device from prior.   2. Right ventricular systolic function is hyperdynamic. The right ventricular size is normal.   3. Left ventricular ejection fraction, by estimation, is 55 to 60%. The left ventricle has normal function.   4. The mitral valve is abnormal. Trivial mitral valve regurgitation. No evidence of mitral stenosis.   5. The tricuspid valve is abnormal.   6. The aortic valve is tricuspid. Aortic valve regurgitation is mild. No aortic stenosis is present.   LAAO, 07/09/2021 1.Successful implantation of a WATCHMAN left atrial appendage occlusive device    2. TEE demonstrating no LAA thrombus 3. No early apparent complications.   CT cadiac, 05/08/2021 1. The left atrial appendage is a large chicken wing morphology without thrombus. 2. A 31 mm watchman FLX device is recommended based on the above landing zone measurements (24.7 mm maximum diameter; 20% compression). 3. An inferior posterior IAS puncture site is recommended.  5. Optimal deployment angle: RAO 3 CRA 8  6. CAC score of 576, which is 83rd percentile for age-, sex-, and race-matched controls CT chest over-read IMPRESSION: 1. Aortic Atherosclerosis (ICD10-I70.0).   ASSESSMENT AND PLAN:  #) PACs No palpitations or symptomatic burden, however zio showed 5% PAC burden Tolerating 75mg  toprol daily split to 50mg  in AM, 25mg  in PM   #) parox Afib/flutter s/p AF ablation #) h/o GI bleed #) s/p LAAO with watchman device Plavix stopped.  No longer requires SBE for dental  cleanings/procedures CHA2DS2-VASc Score = 6 (CHF, HTN, DM, Vasc dz, age x 2) No OAC d/t LAAO device    }   #) non-obstrucive CAD Recent CTA without obstructive CAD Most recent lipid panel reassuring  #) HTN Normotensive in office today   Current medicines are reviewed at length with the patient today.   The patient does not have concerns regarding his medicines.  The following changes were made today:   none  Labs/ tests ordered today include:  Orders Placed This Encounter  Procedures   Ambulatory referral to Cardiology   EKG 12-Lead     Disposition: Follow up with Dr. Lalla Brothers in 12 months  Schedule appt with gen cards in [facility] office in about 3 months to tx care to more convenient office   Signed, Sherie Don, NP  01/19/23  2:44 PM  Electrophysiology [facility]

## 2023-01-20 ENCOUNTER — Ambulatory Visit: Payer: Medicare Other | Attending: Cardiology | Admitting: Cardiology

## 2023-01-20 ENCOUNTER — Encounter: Payer: Self-pay | Admitting: Cardiology

## 2023-01-20 VITALS — BP 120/60 | HR 61 | Ht 72.0 in | Wt 195.4 lb

## 2023-01-20 DIAGNOSIS — I10 Essential (primary) hypertension: Secondary | ICD-10-CM | POA: Diagnosis not present

## 2023-01-20 DIAGNOSIS — I480 Paroxysmal atrial fibrillation: Secondary | ICD-10-CM | POA: Insufficient documentation

## 2023-01-20 DIAGNOSIS — I491 Atrial premature depolarization: Secondary | ICD-10-CM | POA: Insufficient documentation

## 2023-01-20 DIAGNOSIS — Z95818 Presence of other cardiac implants and grafts: Secondary | ICD-10-CM | POA: Insufficient documentation

## 2023-01-20 NOTE — Patient Instructions (Signed)
Medication Instructions:  Your physician recommends that you continue on your current medications as directed. Please refer to the Current Medication list given to you today.  *If you need a refill on your cardiac medications before your next appointment, please call your pharmacy*  Lab Work: -None ordered  Testing/Procedures: -None ordered  Follow-Up: At [facility], you and your health needs are our priority.  As part of our continuing mission to provide you with exceptional heart care, we have created designated Provider Care Teams.  These Care Teams include your primary Cardiologist (physician) and Advanced Practice Providers (APPs -  Physician Assistants and Nurse Practitioners) who all work together to provide you with the care you need, when you need it.  Your next appointment:   1 year(s)  Provider:   Steffanie Dunn, MD or Sherie Don, NP    Other Instructions Ambulatory referral to Cardiology - for 3 - 4 months

## 2023-01-26 ENCOUNTER — Telehealth: Payer: Self-pay | Admitting: Hematology and Oncology

## 2023-01-31 ENCOUNTER — Other Ambulatory Visit: Payer: Self-pay | Admitting: Cardiology

## 2023-01-31 DIAGNOSIS — D471 Chronic myeloproliferative disease: Secondary | ICD-10-CM | POA: Diagnosis not present

## 2023-01-31 DIAGNOSIS — D7581 Myelofibrosis: Secondary | ICD-10-CM | POA: Diagnosis not present

## 2023-02-02 ENCOUNTER — Ambulatory Visit: Payer: Medicare Other | Admitting: Hematology and Oncology

## 2023-02-02 ENCOUNTER — Other Ambulatory Visit: Payer: Medicare Other

## 2023-02-07 DIAGNOSIS — Z23 Encounter for immunization: Secondary | ICD-10-CM | POA: Diagnosis not present

## 2023-02-14 DIAGNOSIS — D471 Chronic myeloproliferative disease: Secondary | ICD-10-CM | POA: Diagnosis not present

## 2023-02-14 DIAGNOSIS — D7581 Myelofibrosis: Secondary | ICD-10-CM | POA: Diagnosis not present

## 2023-03-02 DIAGNOSIS — D7581 Myelofibrosis: Secondary | ICD-10-CM | POA: Diagnosis not present

## 2023-03-02 DIAGNOSIS — D471 Chronic myeloproliferative disease: Secondary | ICD-10-CM | POA: Diagnosis not present

## 2023-03-04 ENCOUNTER — Other Ambulatory Visit: Payer: Self-pay | Admitting: Family Medicine

## 2023-03-04 DIAGNOSIS — I429 Cardiomyopathy, unspecified: Secondary | ICD-10-CM

## 2023-03-04 DIAGNOSIS — E7800 Pure hypercholesterolemia, unspecified: Secondary | ICD-10-CM

## 2023-03-28 DIAGNOSIS — D471 Chronic myeloproliferative disease: Secondary | ICD-10-CM | POA: Diagnosis not present

## 2023-04-05 ENCOUNTER — Ambulatory Visit: Payer: Medicare Other | Attending: Cardiovascular Disease | Admitting: Cardiovascular Disease

## 2023-04-05 ENCOUNTER — Encounter: Payer: Self-pay | Admitting: Cardiovascular Disease

## 2023-04-05 VITALS — BP 112/60 | HR 79 | Ht 72.0 in | Wt 204.5 lb

## 2023-04-05 DIAGNOSIS — I480 Paroxysmal atrial fibrillation: Secondary | ICD-10-CM

## 2023-04-05 DIAGNOSIS — I10 Essential (primary) hypertension: Secondary | ICD-10-CM

## 2023-04-05 DIAGNOSIS — I2510 Atherosclerotic heart disease of native coronary artery without angina pectoris: Secondary | ICD-10-CM

## 2023-04-05 DIAGNOSIS — E7800 Pure hypercholesterolemia, unspecified: Secondary | ICD-10-CM

## 2023-04-05 DIAGNOSIS — I428 Other cardiomyopathies: Secondary | ICD-10-CM

## 2023-04-05 NOTE — Progress Notes (Unsigned)
Cardiology Office Note   Date:  04/05/2023   ID:  Paul Fry, DOB 20-Aug-1945, MRN [id number]  PCP:  Joaquim Nam, MD  Cardiologist:  Lorine Bears, MD   Chief Complaint  Patient presents with   New Patient (Initial Visit)    AFIB/PAC/HTN no complaints today. Meds reviewed verbally with pt.      History of Present Illness: Paul Fry is a 77 y.o. male who presents for ***    Past Medical History:  Diagnosis Date   Allergy    SEASONAL   Anemia    Arthritis    KNEES   BPH (benign prostatic hyperplasia)    Cancer (HCC)    Clotting disorder (HCC)    Difficult airway for intubation 08/25/2021   Diverticulosis of colon    Elevated PSA    1.22 on 01-20-2016   Feeling of incomplete bladder emptying    GERD (gastroesophageal reflux disease)    Hiatal hernia    History of adenomatous polyp of colon    2014 tubular adenoma   History of chronic gastritis    History of Helicobacter pylori infection    07/ 2016   History of lower GI bleeding    07/ 2010 and 12/ 2011  diverticular bleed both times   Hyperlipidemia    Hypertension    Lower urinary tract symptoms (LUTS)    Nonischemic cardiomyopathy (HCC)    Plantar fascia syndrome    Presence of Watchman left atrial appendage closure device 07/09/2021   Watchman FLX 31mm with Dr. Lalla Brothers   Strains to urinate    INTERMITTANT   Type 2 diabetes, diet controlled (HCC)    ON BORDER LINE,NEVER TOOK MEDICATION.   Wears glasses     Past Surgical History:  Procedure Laterality Date   APPENDECTOMY  teenager   ATRIAL FIBRILLATION ABLATION N/A 05/15/2021   Procedure: ATRIAL FIBRILLATION ABLATION;  Surgeon: Lanier Prude, MD;  Location: [facility];  Service: Cardiovascular;  Laterality: N/A;   BIOPSY  11/19/2021   Procedure: BIOPSY;  Surgeon: Meryl Dare, MD;  Location: [facility];  Service: Gastroenterology;;   CARDIAC CATHETERIZATION  09-10-2008   dr dalton mclean   mild  non-obstructive CAD;  30-40% mRCA, 20%pLAD,  ef 55%   CARDIOVASCULAR STRESS TEST  07/29/2011   Low risk nuclear study w/ no ischemia or infarct/  LVEF 47% and  apical hypokinesis (08-07-2012 cardiac MRI -- EF 49% w/ no hyperenhancement distal septal and apical hypokinesis, mild LAE, mild RVE, mild AR with mild dilated sinus 39mm)   CATARACT EXTRACTION W/ INTRAOCULAR LENS  IMPLANT, BILATERAL  2016   COLONOSCOPY  last one 12-21-2012   COLONOSCOPY WITH PROPOFOL N/A 11/19/2021   Procedure: COLONOSCOPY WITH PROPOFOL;  Surgeon: Meryl Dare, MD;  Location: [facility];  Service: Gastroenterology;  Laterality: N/A;   CYSTOSCOPY WITH INSERTION OF UROLIFT N/A 06/17/2016   Procedure: CYSTOSCOPY WITH INSERTION OF UROLIFT;  Surgeon: Jethro Bolus, MD;  Location: [facility];  Service: Urology;  Laterality: N/A;   ESOPHAGOGASTRODUODENOSCOPY  last one 11-22-2014   EYE SURGERY     LEFT ATRIAL APPENDAGE OCCLUSION N/A 07/09/2021   Procedure: LEFT ATRIAL APPENDAGE OCCLUSION;  Surgeon: Lanier Prude, MD;  Location: [facility];  Service: Cardiovascular;  Laterality: N/A;   POLYPECTOMY  11/19/2021   Procedure: POLYPECTOMY;  Surgeon: Meryl Dare, MD;  Location: [facility];  Service: Gastroenterology;;   TEE WITHOUT CARDIOVERSION N/A 07/09/2021  Procedure: TRANSESOPHAGEAL ECHOCARDIOGRAM (TEE);  Surgeon: Lanier Prude, MD;  Location: [facility] INVAS[facility] CV LAB;  Service: Cardiovascular;  Laterality: N/A;   TEE WITHOUT CARDIOVERSION N/A 08/20/2021   Procedure: TRANSESOPHAGEAL ECHOCARDIOGRAM (TEE);  Surgeon: Christell Constant, MD;  Location: [facility] ENDOSCOPY;  Service: Cardiovascular;  Laterality: N/A;   TEE WITHOUT CARDIOVERSION N/A 01/08/2022   Procedure: TRANSESOPHAGEAL ECHOCARDIOGRAM (TEE);  Surgeon: Wendall Stade, MD;  Location: [facility];  Service: Cardiovascular;  Laterality: N/A;   TRANSTHORACIC ECHOCARDIOGRAM  09/17/2014   ef 50-55%/  trivial AR, MR and PR/   mild LAE and RAE/  mild TR     Current Outpatient Medications  Medication Sig Dispense Refill   ascorbic acid (VITAMIN C) 1000 MG tablet Take 1,000 mg by mouth in the morning.     atorvastatin (LIPITOR) 20 MG tablet TAKE 1 TABLET EVERY MORNING (Patient taking differently: Take 10 mg by mouth every morning.) 90 tablet 3   JAKAFI 20 MG tablet Take 20 mg by mouth 2 (two) times daily.     loperamide (IMODIUM A-D) 2 MG tablet Take 1 tablet (2 mg total) by mouth daily as needed for diarrhea or loose stools.     losartan (COZAAR) 25 MG tablet TAKE 1/2 TABLET EVERY MORNING 45 tablet 3   metoprolol succinate (TOPROL-XL) 50 MG 24 hr tablet TAKE 1 TABLET IN THE MORNING. TAKE 1/2 TABLET IN THE EVENING. TAKE WITH OR IMMEDIATELY FOLLOWING A MEAL. 135 tablet 3   Multiple Minerals-Vitamins (CALCIUM 600+D3 PLUS MINERALS) TABS Take 1 tablet by mouth in the morning.     Multiple Vitamin (MULTIVITAMIN WITH MINERALS) TABS tablet Take 1 tablet by mouth daily. Centrum     Omega-3 Fatty Acids (FISH OIL PO) Take 1 capsule by mouth in the morning.     tamsulosin (FLOMAX) 0.4 MG CAPS capsule Take 2 capsules (0.8 mg total) by mouth daily. 180 capsule 3   No current facility-administered medications for this visit.    Allergies:   Aspirin and Atorvastatin    Social History:  The patient  reports that he has never smoked. He has never used smokeless tobacco. He reports that he does not currently use alcohol. He reports that he does not use drugs.   Family History:  The patient's ***family history includes Colon cancer (age of onset: 6) in his mother; Colon polyps in his brother; Heart disease in his sister; Hypertension in his sister and sister; Kidney disease in his sister; Lung cancer in his brother.    ROS:  Please see the history of present illness.   Otherwise, review of systems are positive for {NONE DEFAULTED:18576}.   All other systems are reviewed and negative.    PHYSICAL EXAM: VS:  BP 112/60 (BP  Location: Right Arm, Patient Position: Sitting, Cuff Size: Large)   Pulse 79   Ht 6' (1.829 m)   Wt 204 lb 8 oz (92.8 kg)   SpO2 97%   BMI 27.74 kg/m  , BMI Body mass index is 27.74 kg/m. GEN: Well nourished, well developed, in no acute distress  HEENT: normal  Neck: no JVD, carotid bruits, or masses Cardiac: ***RRR; no murmurs, rubs, or gallops,no edema  Respiratory:  clear to auscultation bilaterally, normal work of breathing GI: soft, nontender, nondistended, + BS MS: no deformity or atrophy  Skin: warm and dry, no rash Neuro:  Strength and sensation are intact Psych: euthymic mood, full affect   EKG:  EKG {ACTION; IS/IS ZOX:[id number]} ordered today. The ekg ordered today demonstrates ***  Recent Labs: 07/26/2022: Magnesium 2.2 11/29/2022: ALT 21; BUN 12; Creatinine 0.84; Hemoglobin 10.3; Platelet Count 260; Potassium 4.2; Sodium 138    Lipid Panel    Component Value Date/Time   CHOL 111 11/26/2022 0835   TRIG 129 11/26/2022 0835   HDL 25 (L) 11/26/2022 0835   CHOLHDL 4.4 11/26/2022 0835   VLDL 28.4 11/30/2021 0948   LDLCALC 65 11/26/2022 0835      Wt Readings from Last 3 Encounters:  04/05/23 204 lb 8 oz (92.8 kg)  01/20/23 195 lb 6.4 oz (88.6 kg)  12/03/22 196 lb (88.9 kg)      Other studies Reviewed: Additional studies/ records that were reviewed today include: ***. Review of the above records demonstrates: ***     04/05/2023    2:56 PM  PAD Screen  Previous PAD dx? No  Previous surgical procedure? No  Pain with walking? No  Feet/toe relief with dangling? No  Painful, non-healing ulcers? No  Extremities discolored? No      ASSESSMENT AND PLAN:  1.  ***    Disposition:   FU with *** in {gen number 1-61:[id number]} {Days to years:10300}  Signed,  Lorine Bears, MD  04/05/2023 3:17 PM    [facility]

## 2023-04-05 NOTE — Patient Instructions (Signed)
Medication Instructions:  No changes *If you need a refill on your cardiac medications before your next appointment, please call your pharmacy*   Lab Work: None ordered If you have labs (blood work) drawn today and your tests are completely normal, you will receive your results only by: MyChart Message (if you have MyChart) OR A paper copy in the mail If you have any lab test that is abnormal or we need to change your treatment, we will call you to review the results.   Testing/Procedures: None ordered   Follow-Up: At [facility], you and your health needs are our priority.  As part of our continuing mission to provide you with exceptional heart care, we have created designated Provider Care Teams.  These Care Teams include your primary Cardiologist (physician) and Advanced Practice Providers (APPs -  Physician Assistants and Nurse Practitioners) who all work together to provide you with the care you need, when you need it.  We recommend signing up for the patient portal called "MyChart".  Sign up information is provided on this After Visit Summary.  MyChart is used to connect with patients for Virtual Visits (Telemedicine).  Patients are able to view lab/test results, encounter notes, upcoming appointments, etc.  Non-urgent messages can be sent to your provider as well.   To learn more about what you can do with MyChart, go to [email or web address].    Your next appointment:   12 month(s)  Provider:   Dr. Kirke Corin

## 2023-04-25 DIAGNOSIS — D649 Anemia, unspecified: Secondary | ICD-10-CM | POA: Diagnosis not present

## 2023-04-25 DIAGNOSIS — D696 Thrombocytopenia, unspecified: Secondary | ICD-10-CM | POA: Diagnosis not present

## 2023-04-25 DIAGNOSIS — D471 Chronic myeloproliferative disease: Secondary | ICD-10-CM | POA: Diagnosis not present

## 2023-04-25 DIAGNOSIS — D7581 Myelofibrosis: Secondary | ICD-10-CM | POA: Diagnosis not present

## 2023-05-09 DIAGNOSIS — D471 Chronic myeloproliferative disease: Secondary | ICD-10-CM | POA: Diagnosis not present

## 2023-05-09 DIAGNOSIS — D7581 Myelofibrosis: Secondary | ICD-10-CM | POA: Diagnosis not present

## 2023-05-16 DIAGNOSIS — D7581 Myelofibrosis: Secondary | ICD-10-CM | POA: Diagnosis not present

## 2023-05-16 DIAGNOSIS — D471 Chronic myeloproliferative disease: Secondary | ICD-10-CM | POA: Diagnosis not present

## 2023-05-29 ENCOUNTER — Other Ambulatory Visit: Payer: Self-pay | Admitting: Family Medicine

## 2023-05-29 DIAGNOSIS — E119 Type 2 diabetes mellitus without complications: Secondary | ICD-10-CM

## 2023-05-30 ENCOUNTER — Other Ambulatory Visit (INDEPENDENT_AMBULATORY_CARE_PROVIDER_SITE_OTHER): Payer: Medicare Other

## 2023-05-30 DIAGNOSIS — E119 Type 2 diabetes mellitus without complications: Secondary | ICD-10-CM | POA: Diagnosis not present

## 2023-05-30 LAB — CBC WITH DIFFERENTIAL/PLATELET
Basophils Absolute: 0 10*3/uL (ref 0.0–0.1)
Basophils Relative: 0.7 % (ref 0.0–3.0)
Eosinophils Absolute: 0 10*3/uL (ref 0.0–0.7)
Eosinophils Relative: 0.2 % (ref 0.0–5.0)
HCT: 25.3 % — ABNORMAL LOW (ref 39.0–52.0)
Hemoglobin: 8.6 g/dL — ABNORMAL LOW (ref 13.0–17.0)
Lymphocytes Relative: 49.4 % — ABNORMAL HIGH (ref 12.0–46.0)
Lymphs Abs: 2.2 10*3/uL (ref 0.7–4.0)
MCHC: 34.1 g/dL (ref 30.0–36.0)
MCV: 90.8 fL (ref 78.0–100.0)
Monocytes Absolute: 0.2 10*3/uL (ref 0.1–1.0)
Monocytes Relative: 4.3 % (ref 3.0–12.0)
Neutro Abs: 2 10*3/uL (ref 1.4–7.7)
Neutrophils Relative %: 45.4 % (ref 43.0–77.0)
Platelets: 149 10*3/uL — ABNORMAL LOW (ref 150.0–400.0)
RBC: 2.79 Mil/uL — ABNORMAL LOW (ref 4.22–5.81)
RDW: 20.2 % — ABNORMAL HIGH (ref 11.5–15.5)
WBC: 4.5 10*3/uL (ref 4.0–10.5)

## 2023-05-30 LAB — COMPREHENSIVE METABOLIC PANEL
ALT: 30 U/L (ref 0–53)
AST: 34 U/L (ref 0–37)
Albumin: 4.4 g/dL (ref 3.5–5.2)
Alkaline Phosphatase: 36 U/L — ABNORMAL LOW (ref 39–117)
BUN: 16 mg/dL (ref 6–23)
CO2: 29 meq/L (ref 19–32)
Calcium: 9 mg/dL (ref 8.4–10.5)
Chloride: 105 meq/L (ref 96–112)
Creatinine, Ser: 0.89 mg/dL (ref 0.40–1.50)
GFR: 82.64 mL/min (ref >60.00)
Glucose, Bld: 132 mg/dL — ABNORMAL HIGH (ref 70–99)
Potassium: 4.2 meq/L (ref 3.5–5.1)
Sodium: 139 meq/L (ref 135–145)
Total Bilirubin: 1 mg/dL (ref 0.2–1.2)
Total Protein: 7.5 g/dL (ref 6.0–8.3)

## 2023-05-30 LAB — HEMOGLOBIN A1C: Hgb A1c MFr Bld: 6.3 % (ref 4.6–6.5)

## 2023-06-01 ENCOUNTER — Encounter: Payer: Self-pay | Admitting: Family Medicine

## 2023-06-01 DIAGNOSIS — D7581 Myelofibrosis: Secondary | ICD-10-CM | POA: Diagnosis not present

## 2023-06-01 DIAGNOSIS — R5383 Other fatigue: Secondary | ICD-10-CM | POA: Diagnosis not present

## 2023-06-01 DIAGNOSIS — D471 Chronic myeloproliferative disease: Secondary | ICD-10-CM | POA: Diagnosis not present

## 2023-06-01 DIAGNOSIS — Z79899 Other long term (current) drug therapy: Secondary | ICD-10-CM | POA: Diagnosis not present

## 2023-06-01 DIAGNOSIS — D649 Anemia, unspecified: Secondary | ICD-10-CM | POA: Diagnosis not present

## 2023-06-01 DIAGNOSIS — D696 Thrombocytopenia, unspecified: Secondary | ICD-10-CM | POA: Diagnosis not present

## 2023-06-02 LAB — FRUCTOSAMINE: Fructosamine: 291 umol/L — ABNORMAL HIGH (ref 205–285)

## 2023-06-06 ENCOUNTER — Ambulatory Visit: Payer: Medicare Other | Admitting: Family Medicine

## 2023-06-08 DIAGNOSIS — D471 Chronic myeloproliferative disease: Secondary | ICD-10-CM | POA: Diagnosis not present

## 2023-06-08 DIAGNOSIS — D7581 Myelofibrosis: Secondary | ICD-10-CM | POA: Diagnosis not present

## 2023-06-09 ENCOUNTER — Encounter: Payer: Self-pay | Admitting: Family Medicine

## 2023-06-09 ENCOUNTER — Ambulatory Visit (INDEPENDENT_AMBULATORY_CARE_PROVIDER_SITE_OTHER): Payer: Medicare Other | Admitting: Family Medicine

## 2023-06-09 VITALS — BP 122/64 | HR 65 | Temp 98.1°F | Ht 72.0 in | Wt 208.8 lb

## 2023-06-09 DIAGNOSIS — D7581 Myelofibrosis: Secondary | ICD-10-CM | POA: Diagnosis not present

## 2023-06-09 DIAGNOSIS — E7800 Pure hypercholesterolemia, unspecified: Secondary | ICD-10-CM | POA: Diagnosis not present

## 2023-06-09 DIAGNOSIS — E119 Type 2 diabetes mellitus without complications: Secondary | ICD-10-CM | POA: Diagnosis not present

## 2023-06-09 DIAGNOSIS — I429 Cardiomyopathy, unspecified: Secondary | ICD-10-CM | POA: Diagnosis not present

## 2023-06-09 DIAGNOSIS — M5412 Radiculopathy, cervical region: Secondary | ICD-10-CM | POA: Insufficient documentation

## 2023-06-09 MED ORDER — LOSARTAN POTASSIUM 25 MG PO TABS: ORAL_TABLET | ORAL | 3 refills | Status: DC

## 2023-06-09 MED ORDER — RUXOLITINIB PHOSPHATE 15 MG PO TABS: 15.0000 mg | ORAL_TABLET | Freq: Two times a day (BID) | ORAL | Status: DC

## 2023-06-09 MED ORDER — ATORVASTATIN CALCIUM 10 MG PO TABS: 10.0000 mg | ORAL_TABLET | Freq: Every morning | ORAL | 3 refills | Status: DC

## 2023-06-09 NOTE — Progress Notes (Signed)
Diabetes:  No meds.  Hypoglycemic episodes: no sx Hyperglycemic episodes: no sx Feet problems: no tingling.  He had flare of plantar fasciitis that improved in the meantime with stretching.   Blood Sugars averaging: not checked.   eye exam within last year: has f/u pending.   Labs d/w pt.    A1c ~6.5 with fructosamine, d/w pt.    Taking 10mg  atorvastatin and tolerating that.  New rx sent so he didn't have to cut 20mg  tabs.  D/w pt.    Still followed by hematology.  D/w pt.  No fevers, no chills.  Episodic fatigue, some days more than others.  "Up and down" but not progressive.    Meds, vitals, and allergies reviewed.   ROS: Per HPI unless specifically indicated in ROS section   GEN: nad, alert and oriented HEENT: ncat NECK: supple w/o LA CV: rrr. PULM: ctab, no inc wob ABD: soft, +bs EXT: no edema SKIN: no acute rash

## 2023-06-09 NOTE — Patient Instructions (Addendum)
Recheck in summer 2025 at a yearly visit.  Labs ahead of time.  Take care.  Glad to see you.

## 2023-06-12 NOTE — Assessment & Plan Note (Signed)
Per hematology, d/w pt.

## 2023-06-12 NOTE — Assessment & Plan Note (Signed)
Taking 10mg  atorvastatin and tolerating that.  New rx sent so he didn't have to cut 20mg  tabs.  D/w pt.  Continue as is o/w.

## 2023-06-12 NOTE — Assessment & Plan Note (Signed)
A1c ~6.5 with fructosamine, d/w pt.   No change in meds. Recheck in summer 2025 at a yearly visit.  Labs ahead of time.

## 2023-06-29 DIAGNOSIS — D7581 Myelofibrosis: Secondary | ICD-10-CM | POA: Diagnosis not present

## 2023-06-29 DIAGNOSIS — D471 Chronic myeloproliferative disease: Secondary | ICD-10-CM | POA: Diagnosis not present

## 2023-07-06 ENCOUNTER — Telehealth: Payer: Self-pay

## 2023-07-06 DIAGNOSIS — D7581 Myelofibrosis: Secondary | ICD-10-CM | POA: Diagnosis not present

## 2023-07-06 DIAGNOSIS — D471 Chronic myeloproliferative disease: Secondary | ICD-10-CM | POA: Diagnosis not present

## 2023-07-06 DIAGNOSIS — D649 Anemia, unspecified: Secondary | ICD-10-CM | POA: Diagnosis not present

## 2023-07-06 DIAGNOSIS — R5383 Other fatigue: Secondary | ICD-10-CM | POA: Diagnosis not present

## 2023-07-06 NOTE — Telephone Encounter (Signed)
 Called to check in with patient, who had LAAO on 07/09/2021. From a Watchman perspective, the patient reports he is doing well. He enjoys being off Xarelto.  While on the phone, the patient reported he has more SOB than usual. He does not complain of CP.  He requested Dr. Jari Sportsman nurse to call him tomorrow to see if he needs to be seen prior to his 1 year visit in November. He was grateful for call.

## 2023-07-07 NOTE — Telephone Encounter (Signed)
 Call placed to the patient. He stated that he has been having shortness of breath for a few weeks. The shortness of breath occurs when he is ambulating but not when he is at rest. He denies chest pain and denies swelling.  His oncologist has discontinued one of his medications to see if this was causing the shortness of breath. His last dose was Wednesday.   Appointment made for 3/13 with Dr. Kirke Corin. The patient has been advised to call back if he feels the shortness of breath is getting worse.

## 2023-07-07 NOTE — Telephone Encounter (Signed)
 ok

## 2023-07-13 DIAGNOSIS — D471 Chronic myeloproliferative disease: Secondary | ICD-10-CM | POA: Diagnosis not present

## 2023-07-13 DIAGNOSIS — D7581 Myelofibrosis: Secondary | ICD-10-CM | POA: Diagnosis not present

## 2023-07-20 DIAGNOSIS — D471 Chronic myeloproliferative disease: Secondary | ICD-10-CM | POA: Diagnosis not present

## 2023-07-20 DIAGNOSIS — D7581 Myelofibrosis: Secondary | ICD-10-CM | POA: Diagnosis not present

## 2023-07-21 ENCOUNTER — Ambulatory Visit: Payer: Medicare Other | Attending: Cardiovascular Disease | Admitting: Cardiovascular Disease

## 2023-07-21 ENCOUNTER — Encounter: Payer: Self-pay | Admitting: Cardiovascular Disease

## 2023-07-21 VITALS — BP 116/68 | HR 66 | Ht 72.0 in | Wt 209.6 lb

## 2023-07-21 DIAGNOSIS — R0602 Shortness of breath: Secondary | ICD-10-CM | POA: Insufficient documentation

## 2023-07-21 DIAGNOSIS — I10 Essential (primary) hypertension: Secondary | ICD-10-CM | POA: Insufficient documentation

## 2023-07-21 DIAGNOSIS — E785 Hyperlipidemia, unspecified: Secondary | ICD-10-CM | POA: Diagnosis present

## 2023-07-21 DIAGNOSIS — I491 Atrial premature depolarization: Secondary | ICD-10-CM | POA: Diagnosis not present

## 2023-07-21 DIAGNOSIS — I480 Paroxysmal atrial fibrillation: Secondary | ICD-10-CM | POA: Insufficient documentation

## 2023-07-21 NOTE — Progress Notes (Signed)
 Cardiology Office Note   Date:  07/21/2023   Paul Fry, DOB 02/14/1946, MRN [id number]  PCP:  Joaquim Nam, MD  Cardiologist:   Lorine Bears, MD   Chief Complaint  Patient presents with   Shortness of Breath    Patient states that he gets winded. Meds reviewed.       History of Present Illness: Paul Fry is a 78 y.o. male who is here today for a follow-up visit and evaluation of worsening dyspnea.   He has known history of type 2 diabetes, mild nonobstructive coronary artery disease, chronic systolic heart failure, atrial fibrillation/flutter status post ablation/Watchman device placement and history of GI bleed while he was on anticoagulation. He had cardiac catheterization done in 2010 and the images were personally reviewed by me today.  It showed mild nonobstructive coronary artery disease with mild coronary artery ectasia especially in the proximal LAD. Previous echocardiogram showed mildly reduced LV systolic function with most recent TEE in September 2023 showed normal LV systolic function with an EF of 55 to 60% with mild mitral regurgitation.  He has myelofibrosis currently being treated with chemotherapy at [facility].  He has been on [place] which was discontinued recently due to multiple side effects.  The patient is here today due to dyspnea with minimal exertion and fatigue.  No chest pain, orthopnea or PND.  He gained some weight but no significant lower extremity edema.   Past Medical History:  Diagnosis Date   Allergy    SEASONAL   Anemia    Arthritis    KNEES   BPH (benign prostatic hyperplasia)    Cancer (HCC)    Clotting disorder (HCC)    Difficult airway for intubation 08/25/2021   Diverticulosis of colon    Elevated PSA    1.22 on 01-20-2016   Feeling of incomplete bladder emptying    GERD (gastroesophageal reflux disease)    Hiatal hernia    History of adenomatous polyp of colon    2014 tubular adenoma   History of chronic  gastritis    History of Helicobacter pylori infection    07/ 2016   History of lower GI bleeding    07/ 2010 and 12/ 2011  diverticular bleed both times   Hyperlipidemia    Hypertension    Lower urinary tract symptoms (LUTS)    Nonischemic cardiomyopathy (HCC)    Plantar fascia syndrome    Presence of Watchman left atrial appendage closure device 07/09/2021   Watchman FLX 31mm with Dr. Lalla Brothers   Strains to urinate    INTERMITTANT   Type 2 diabetes, diet controlled (HCC)    ON BORDER LINE,NEVER TOOK MEDICATION.   Wears glasses     Past Surgical History:  Procedure Laterality Date   APPENDECTOMY  teenager   ATRIAL FIBRILLATION ABLATION N/A 05/15/2021   Procedure: ATRIAL FIBRILLATION ABLATION;  Surgeon: Lanier Prude, MD;  Location: [facility]AS[facility] CV [facility];  Service: Cardiovascular;  Laterality: N/A;   BIOPSY  11/19/2021   Procedure: BIOPSY;  Surgeon: Meryl Dare, MD;  Location: [facility] ENDOSCOPY;  Service: Gastroenterology;;   CARDIAC CATHETERIZATION  09-10-2008   dr dalton mclean   mild non-obstructive CAD;  30-40% mRCA, 20%pLAD,  ef 55%   CARDIOVASCULAR STRESS TEST  07/29/2011   Low risk nuclear study w/ no ischemia or infarct/  LVEF 47% and  apical hypokinesis (08-07-2012 cardiac MRI -- EF 49% w/ no hyperenhancement distal septal and apical hypokinesis, mild LAE,  mild RVE, mild AR with mild dilated sinus 39mm)   CATARACT EXTRACTION W/ INTRAOCULAR LENS  IMPLANT, BILATERAL  2016   COLONOSCOPY  last one 12-21-2012   COLONOSCOPY WITH PROPOFOL N/A 11/19/2021   Procedure: COLONOSCOPY WITH PROPOFOL;  Surgeon: Meryl Dare, MD;  Location: [facility];  Service: Gastroenterology;  Laterality: N/A;   CYSTOSCOPY WITH INSERTION OF UROLIFT N/A 06/17/2016   Procedure: CYSTOSCOPY WITH INSERTION OF UROLIFT;  Surgeon: Jethro Bolus, MD;  Location[facility];  Service: Urology;  Laterality: N/A;   ESOPHAGOGASTRODUODENOSCOPY  last one 11-22-2014   EYE SURGERY     LEFT  ATRIAL APPENDAGE OCCLUSION N/A 07/09/2021   Procedure: LEFT ATRIAL APPENDAGE OCCLUSION;  Surgeon: Lanier Prude, MD;  Location: [facility];  Service: Cardiovascular;  Laterality: N/A;   POLYPECTOMY  11/19/2021   Procedure: POLYPECTOMY;  Surgeon: Meryl Dare, MD;  Location: [facility];  Service: Gastroenterology;;   TEE WITHOUT CARDIOVERSION N/A 07/09/2021   Procedure: TRANSESOPHAGEAL ECHOCARDIOGRAM (TEE);  Surgeon: Lanier Prude, MD;  Location: [facility] INVASIVE CV LAB;  Service: Cardiovascular;  Laterality: N/A;   TEE WITHOUT CARDIOVERSION N/A 08/20/2021   Procedure: TRANSESOPHAGEAL ECHOCARDIOGRAM (TEE);  Surgeon: Christell Constant, MD;  Location: [facility] ENDOSCOPY;  Service: Cardiovascular;  Laterality: N/A;   TEE WITHOUT CARDIOVERSION N/A 01/08/2022   Procedure: TRANSESOPHAGEAL ECHOCARDIOGRAM (TEE);  Surgeon: Wendall Stade, MD;  Location: [facility] ENDOSCOPY;  Service: Cardiovascular;  Laterality: N/A;   TRANSTHORACIC ECHOCARDIOGRAM  09/17/2014   ef 50-55%/  trivial AR, MR and PR/  mild LAE and RAE/  mild TR     Current Outpatient Medications  Medication Sig Dispense Refill   ascorbic acid (VITAMIN C) 1000 MG tablet Take 1,000 mg by mouth in the morning.     atorvastatin (LIPITOR) 10 MG tablet Take 1 tablet (10 mg total) by mouth every morning. 90 tablet 3   losartan (COZAAR) 25 MG tablet TAKE 1/2 TABLET EVERY MORNING 45 tablet 3   metoprolol succinate (TOPROL-XL) 50 MG 24 hr tablet TAKE 1 TABLET IN THE MORNING. TAKE 1/2 TABLET IN THE EVENING. TAKE WITH OR IMMEDIATELY FOLLOWING A MEAL. 135 tablet 3   Multiple Minerals-Vitamins (CALCIUM 600+D3 PLUS MINERALS) TABS Take 1 tablet by mouth in the morning.     Multiple Vitamin (MULTIVITAMIN WITH MINERALS) TABS tablet Take 1 tablet by mouth daily. Centrum     Omega-3 Fatty Acids (FISH OIL PO) Take 1 capsule by mouth in the morning.     tamsulosin (FLOMAX) 0.4 MG CAPS capsule Take 2 capsules (0.8 mg total) by mouth daily. 180  capsule 3   loperamide (IMODIUM A-D) 2 MG tablet Take 1 tablet (2 mg total) by mouth daily as needed for diarrhea or loose stools. (Patient not taking: Reported on 07/21/2023)     ruxolitinib phosphate (JAKAFI) 15 MG tablet Take 1 tablet (15 mg total) by mouth 2 (two) times daily. (Patient not taking: Reported on 07/21/2023)     No current facility-administered medications for this visit.    Allergies:   Aspirin and Atorvastatin    Social History:  The patient  reports that he has never smoked. He has never used smokeless tobacco. He reports that he does not currently use alcohol. He reports that he does not use drugs.   Family History:  The patient's family history includes Colon cancer (age of onset: 32) in his mother; Colon polyps in his brother; Heart disease in his sister; Hypertension in his sister and sister; Kidney disease  in his sister; Lung cancer in his brother.    ROS:  Please see the history of present illness.   Otherwise, review of systems are positive for none.   All other systems are reviewed and negative.    PHYSICAL EXAM: VS:  BP 116/68   Pulse 66   Ht 6' (1.829 m)   Wt 209 lb 9.6 oz (95.1 kg)   SpO2 97%   BMI 28.43 kg/m  , BMI Body mass index is 28.43 kg/m. GEN: Well nourished, well developed, in no acute distress  HEENT: normal  Neck: no JVD, carotid bruits, or masses Cardiac: RRR; no murmurs, rubs, or gallops,no edema  Respiratory:  clear to auscultation bilaterally, normal work of breathing GI: soft, nontender, nondistended, + BS MS: no deformity or atrophy  Skin: warm and dry, no rash Neuro:  Strength and sensation are intact Psych: euthymic mood, full affect   EKG:  EKG is ordered today. The ekg ordered today demonstrates : Normal sinus rhythm with sinus arrhythmia Right bundle branch block Minimal voltage criteria for LVH, may be normal variant ( R in aVL ) When compared with ECG of 05-Apr-2023 15:01, Sinus rhythm is no longer with 2nd degree A-V  block (Mobitz I)      Recent Labs: 07/26/2022: Magnesium 2.2 05/30/2023: ALT 30; BUN 16; Creatinine, Ser 0.89; Hemoglobin 8.6 Repeated and verified X2.; Platelets 149.0; Potassium 4.2; Sodium 139    Lipid Panel    Component Value Date/Time   CHOL 111 11/26/2022 0835   TRIG 129 11/26/2022 0835   HDL 25 (L) 11/26/2022 0835   CHOLHDL 4.4 11/26/2022 0835   VLDL 28.4 11/30/2021 0948   LDLCALC 65 11/26/2022 0835      Wt Readings from Last 3 Encounters:  07/21/23 209 lb 9.6 oz (95.1 kg)  06/09/23 208 lb 12.8 oz (94.7 kg)  04/05/23 204 lb 8 oz (92.8 kg)          04/05/2023    2:56 PM  PAD Screen  Previous PAD dx? No  Previous surgical procedure? No  Pain with walking? No  Feet/toe relief with dangling? No  Painful, non-healing ulcers? No  Extremities discolored? No      ASSESSMENT AND PLAN:  1.  Paroxysmal atrial fibrillation/flutter: Status post ablation with no evidence of recurrent arrhythmia.  He is status post Watchman device placement and does not require anticoagulation.  2.  History of cardiomyopathy: He reports worsening exertional dyspnea which I suspect is due to underlying myelofibrosis and continued anemia.  In addition, he seems to be having side effects with chemotherapy.  I requested an echocardiogram for evaluation.  3.  Essential hypertension: Blood pressure is controlled.  4.  Mild nonobstructive coronary artery disease: Previous cardiac catheterization in 2010.  Most recent cardiac CTA in May of this year showed a calcium score of 895 with mild nonobstructive coronary artery disease.  No anginal symptoms.  5.  Hyperlipidemia: Continue treatment with atorvastatin with a target LDL of less than 70.  Most recent lipid profile in July showed an LDL of 65.    Disposition:   FU with me in 6 months.  Signed,  Lorine Bears, MD  07/21/2023 11:14 AM     [facility]

## 2023-07-21 NOTE — Patient Instructions (Signed)
 Medication Instructions:  No changes *If you need a refill on your cardiac medications before your next appointment, please call your pharmacy*   Lab Work: None ordered If you have labs (blood work) drawn today and your tests are completely normal, you will receive your results only by: MyChart Message (if you have MyChart) OR A paper copy in the mail If you have any lab test that is abnormal or we need to change your treatment, we will call you to review the results.   Testing/Procedures: Your physician has requested that you have an echocardiogram. Echocardiography is a painless test that uses sound waves to create images of your heart. It provides your doctor with information about the size and shape of your heart and how well your heart's chambers and valves are working.   You may receive an ultrasound enhancing agent through an IV if needed to better visualize your heart during the echo. This procedure takes approximately one hour.  There are no restrictions for this procedure.  This will take place at [address] [facility] [address], [place] [address]  Please note: We ask at that you not bring children with you during ultrasound (echo/ vascular) testing. Due to room size and safety concerns, children are not allowed in the ultrasound rooms during exams. Our front office staff cannot provide observation of children in our lobby area while testing is being conducted. An adult accompanying a patient to their appointment will only be allowed in the ultrasound room at the discretion of the ultrasound technician under special circumstances. We apologize for any inconvenience.    Follow-Up: At [facility], you and your health needs are our priority.  As part of our continuing mission to provide you with exceptional heart care, we have created designated Provider Care Teams.  These Care Teams include your primary Cardiologist (physician) and Advanced Practice  Providers (APPs -  Physician Assistants and Nurse Practitioners) who all work together to provide you with the care you need, when you need it.  We recommend signing up for the patient portal called "MyChart".  Sign up information is provided on this After Visit Summary.  MyChart is used to connect with patients for Virtual Visits (Telemedicine).  Patients are able to view lab/test results, encounter notes, upcoming appointments, etc.  Non-urgent messages can be sent to your provider as well.   To learn more about what you can do with MyChart, go to [email or web address].    Your next appointment:   6 month(s)  Provider:   You may see Lorine Bears, MD or one of the following Advanced Practice Providers on your designated Care Team:   Nicolasa Ducking, NP Eula Listen, PA-C Cadence Fransico Michael, PA-C Charlsie Quest, NP Carlos Levering, NP

## 2023-07-27 ENCOUNTER — Ambulatory Visit: Payer: Self-pay | Admitting: Family Medicine

## 2023-07-27 DIAGNOSIS — D471 Chronic myeloproliferative disease: Secondary | ICD-10-CM | POA: Diagnosis not present

## 2023-07-27 DIAGNOSIS — D7581 Myelofibrosis: Secondary | ICD-10-CM | POA: Diagnosis not present

## 2023-07-27 NOTE — Progress Notes (Unsigned)
   Paul Couse T. Danella Philson, MD, CAQ Sports Medicine [facility] at [facility] [address] [place], [address]  Phone: [phone]  FAX: [phone]  Paul Fry - 78 y.o. male  MRN [id number]  Date of Birth: 11-23-1945  Date: 07/28/2023  PCP: Joaquim Nam, MD  Referral: Joaquim Nam, MD  No chief complaint on file.  Subjective:   Paul Fry is a 78 y.o. very pleasant male patient with There is no height or weight on file to calculate BMI. who presents with the following:  Paul Fry is a very nice patient of Dr. Lianne Bushy, and I recall seeing him in the past, and I last saw him about 5 years ago.  He called in with some abdominal pain today and nursing triage recommended follow-up on Thursday with me in the office.    Review of Systems is noted in the HPI, as appropriate  Objective:   There were no vitals taken for this visit.  GEN: No acute distress; alert,appropriate. PULM: Breathing comfortably in no respiratory distress PSYCH: Normally interactive.   Laboratory and Imaging Data:  Assessment and Plan:   ***

## 2023-07-27 NOTE — Telephone Encounter (Signed)
 Agree. Thanks

## 2023-07-27 NOTE — Telephone Encounter (Signed)
  Chief Complaint: abdominal pain Symptoms: abdominal pain around waistline, cough Frequency: since monday Pertinent Negatives: Patient denies fever, rash, vomiting, chest pain, diarrhea,  Disposition: [] ED /[] Urgent Care (no appt availability in office) / [x] Appointment(In office/virtual)/ []  [facility]/ [] Home Care/ [] Refused Recommended Disposition /[]  Mobile Bus/ []  Follow-up with PCP Additional Notes: Patient reports he has been experiencing abdominal pain that gets worse with coughing and moving and goes to 7/10. Patient reports the pain started on Monday. Patient denies diarrhea, vomiting, chest pain, fever. Per protocol, appt scheduled next available tomorrow 3/20. Patient advised to call back with worsening symptoms. Patient verbalized understanding.    Copied from CRM [id number]. Topic: Clinical - Red Word Triage >> Jul 27, 2023 11:57 AM Mackie Pai E wrote: Red Word Triage: Pain on waistline. Patient stated he has been having some pain around his waistline for the past 3 days. Coughing makes the pain worse, and rated it a 6-7 out of 10 for pain (with 10 being extreme). Patient stated there is not a rash or redness around this area. Reason for Disposition  [1] MODERATE pain (e.g., interferes with normal activities) AND [2] pain comes and goes (cramps) AND [3] present > 24 hours  (Exception: Pain with Vomiting or Diarrhea - see that Guideline.)  Answer Assessment - Initial Assessment Questions 1. LOCATION: "Where does it hurt?"      Abdominal pain  2. RADIATION: "Does the pain shoot anywhere else?" (e.g., chest, back)     none 3. ONSET: "When did the pain begin?" (Minutes, hours or days ago)      monday 4. SUDDEN: "Gradual or sudden onset?"     suddenly 5. PATTERN "Does the pain come and go, or is it constant?"    - If it comes and goes: "How long does it last?" "Do you have pain now?"     (Note: Comes and goes means the pain is intermittent. It goes away  completely between bouts.)    - If constant: "Is it getting better, staying the same, or getting worse?"      (Note: Constant means the pain never goes away completely; most serious pain is constant and gets worse.)      Comes and goes, worse when moving 6. SEVERITY: "How bad is the pain?"  (e.g., Scale 1-10; mild, moderate, or severe)    - MILD (1-3): Doesn't interfere with normal activities, abdomen soft and not tender to touch.     - MODERATE (4-7): Interferes with normal activities or awakens from sleep, abdomen tender to touch.     - SEVERE (8-10): Excruciating pain, doubled over, unable to do any normal activities.       7/10 7. RECURRENT SYMPTOM: "Have you ever had this type of stomach pain before?" If Yes, ask: "When was the last time?" and "What happened that time?"      none 8. CAUSE: "What do you think is causing the stomach pain?"     none 9. RELIEVING/AGGRAVATING FACTORS: "What makes it better or worse?" (e.g., antacids, bending or twisting motion, bowel movement)     Sitting still helps, but moving makes it worse 10. OTHER SYMPTOMS: "Do you have any other symptoms?" (e.g., back pain, diarrhea, fever, urination pain, vomiting)       none  Protocols used: Abdominal Pain - Male-A-AH

## 2023-07-28 ENCOUNTER — Ambulatory Visit (INDEPENDENT_AMBULATORY_CARE_PROVIDER_SITE_OTHER): Admitting: Family Medicine

## 2023-07-28 ENCOUNTER — Encounter: Payer: Self-pay | Admitting: Family Medicine

## 2023-07-28 VITALS — BP 130/80 | HR 71 | Temp 98.0°F | Ht 72.0 in | Wt 210.1 lb

## 2023-07-28 DIAGNOSIS — R1012 Left upper quadrant pain: Secondary | ICD-10-CM

## 2023-07-28 DIAGNOSIS — D471 Chronic myeloproliferative disease: Secondary | ICD-10-CM | POA: Diagnosis not present

## 2023-07-28 DIAGNOSIS — R1032 Left lower quadrant pain: Secondary | ICD-10-CM | POA: Diagnosis not present

## 2023-07-29 ENCOUNTER — Ambulatory Visit: Payer: Self-pay

## 2023-07-29 ENCOUNTER — Ambulatory Visit
Admission: RE | Admit: 2023-07-29 | Discharge: 2023-07-29 | Disposition: A | Discharge status: Home / Self Care | Source: Ambulatory Visit | Attending: Family Medicine | Admitting: Family Medicine

## 2023-07-29 ENCOUNTER — Telehealth: Payer: Self-pay | Admitting: Internal Medicine

## 2023-07-29 DIAGNOSIS — K5730 Diverticulosis of large intestine without perforation or abscess without bleeding: Secondary | ICD-10-CM | POA: Diagnosis not present

## 2023-07-29 DIAGNOSIS — D471 Chronic myeloproliferative disease: Secondary | ICD-10-CM | POA: Diagnosis not present

## 2023-07-29 DIAGNOSIS — N281 Cyst of kidney, acquired: Secondary | ICD-10-CM | POA: Diagnosis not present

## 2023-07-29 DIAGNOSIS — R1032 Left lower quadrant pain: Secondary | ICD-10-CM | POA: Insufficient documentation

## 2023-07-29 DIAGNOSIS — R161 Splenomegaly, not elsewhere classified: Secondary | ICD-10-CM | POA: Diagnosis not present

## 2023-07-29 DIAGNOSIS — R1012 Left upper quadrant pain: Secondary | ICD-10-CM | POA: Diagnosis not present

## 2023-07-29 MED ORDER — IOHEXOL 300 MG/ML  SOLN
75.0000 mL | Freq: Once | INTRAMUSCULAR | Status: AC | PRN
  Administered 2023-07-29: 75 mL via INTRAVENOUS

## 2023-07-29 NOTE — Telephone Encounter (Signed)
 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

## 2023-07-29 NOTE — Telephone Encounter (Signed)
 Copied from CRM [id number]. Topic: Clinical - Request for Lab/Test Order >> Jul 29, 2023  5:18 PM Denese Killings wrote: Reason for CRM: French Ana with [facility] Radiology has Dr. Chales Abrahams on the line and he wants to speak with an on call physician for patient.   French Ana with [facility] Radiology called with Dr. Chales Abrahams who wanted to speak with the on call physician about stat CT results. On call provider contacted and I have transferred them to him for further assistance.    Reason for Disposition  Lab or radiology calling with CRITICAL test results  Answer Assessment - Initial Assessment Questions 1. REASON FOR CALL or QUESTION: "What is your reason for calling today?" or "How can I best help you?" or "What question do you have that I can help answer?"     Critical radiology result  2. CALLER: Document the source of call. (e.g., laboratory, patient).     French Ana with [facility] Radiology with Dr. Chales Abrahams  Protocols used: PCP Call - No Triage-A-AH

## 2023-07-31 NOTE — Telephone Encounter (Signed)
 Please check on patient and see how he is feeling in the meantime.  Please let me know.  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  This can happen for multiple reasons, where one of the smaller blood vessels in the spleen closes off or becomes obstructed.  This could cause left-sided abdominal pain.  Please let me know how he is feeling in terms of abdominal pain or any other new symptoms.  I need input from hematology about options going forward regarding splenic infarct.  I am not enthused about anticoagulating him given his baseline history of anemia.  Please send a copy of this scan to hematology and call the hematology clinic to get their input.  Thanks.

## 2023-08-01 NOTE — Telephone Encounter (Signed)
 Dr. Para March and I talked about the case, and I think getting hematology involved is a great idea.

## 2023-08-01 NOTE — Telephone Encounter (Signed)
 This was already addressed.  Spoke to Dr. Alphonsus Sias - discussed with radiology.

## 2023-08-03 DIAGNOSIS — D471 Chronic myeloproliferative disease: Secondary | ICD-10-CM | POA: Diagnosis not present

## 2023-08-03 DIAGNOSIS — D7581 Myelofibrosis: Secondary | ICD-10-CM | POA: Diagnosis not present

## 2023-08-05 NOTE — Telephone Encounter (Addendum)
 Called and spoke to [name] at Dr Homero Fellers office. She made a note for her to look at the CT Scan.   Spoke to pt about being seen here if he has increased shortness of breath prior to his cardiology visit.

## 2023-08-05 NOTE — Telephone Encounter (Signed)
 Please check on patient and see how he is feeling in the meantime.  Please let me know.: Pt says the pain on the left side is not as pain as it was.   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  This can happen for multiple reasons, where one of the smaller blood vessels in the spleen closes off or becomes obstructed.  This could cause left-sided abdominal pain.  Please let me know how he is feeling in terms of abdominal pain or any other new symptoms: Pt states he is having some shortness of breath and his cardiologist is aware. Having a scan in early April.   I need input from hematology about options going forward regarding splenic infarct.  I am not enthused about anticoagulating him given his baseline history of anemia.  Please send a copy of this scan to hematology and call the hematology clinic to get their input.  Thanks: Dr Carley Hammed at [facility] is his oncologist. He said she has been concerned about his spleen. Since he sees oncology, does he need to see a hematologist?

## 2023-08-05 NOTE — Telephone Encounter (Signed)
 Hematology and oncology should be the same clinic.  Please route this to Dr. Loretha Stapler.  Please call to make sure she is aware.  If his SOB is getting worse, then I want to get him rechecked.  Thanks.

## 2023-08-10 DIAGNOSIS — D696 Thrombocytopenia, unspecified: Secondary | ICD-10-CM | POA: Diagnosis not present

## 2023-08-10 DIAGNOSIS — D649 Anemia, unspecified: Secondary | ICD-10-CM | POA: Diagnosis not present

## 2023-08-10 DIAGNOSIS — D7581 Myelofibrosis: Secondary | ICD-10-CM | POA: Diagnosis not present

## 2023-08-10 DIAGNOSIS — D471 Chronic myeloproliferative disease: Secondary | ICD-10-CM | POA: Diagnosis not present

## 2023-08-17 ENCOUNTER — Ambulatory Visit: Attending: Cardiovascular Disease

## 2023-08-17 DIAGNOSIS — R0602 Shortness of breath: Secondary | ICD-10-CM | POA: Diagnosis not present

## 2023-08-17 LAB — ECHOCARDIOGRAM COMPLETE
AR max vel: 2.2 cm2
AV Area VTI: 2.18 cm2
AV Area mean vel: 2.17 cm2
AV Mean grad: 5 mmHg
AV Peak grad: 9.9 mmHg
AV Vena cont: 0.3 cm
Ao pk vel: 1.57 m/s
Area-P 1/2: 3.77 cm2
Calc EF: 53.6 %
P 1/2 time: 678 ms
S' Lateral: 3.9 cm
Single Plane A2C EF: 52.8 %
Single Plane A4C EF: 52.2 %

## 2023-08-22 ENCOUNTER — Telehealth: Payer: Self-pay | Admitting: Cardiovascular Disease

## 2023-08-22 NOTE — Telephone Encounter (Signed)
Called patient, gave ECHO results.  Patient verbalized understanding.   

## 2023-08-22 NOTE — Telephone Encounter (Signed)
Calling about his results. Please advise

## 2023-08-29 IMAGING — CT CT HEART MORPH/PULM VEIN W/ CM & W/O CA SCORE
2 of 6 series · 12 of 20 positions shown, 14 images · non-contrast
Comparison: None.
COMPARISON: None.

Addendum:
EXAM:
OVER-READ INTERPRETATION  CT CHEST

The following report is an over-read performed by radiologist Dr.
Michala Bobr [REDACTED] on 05/08/2021. This
over-read does not include interpretation of cardiac or coronary
anatomy or pathology. The coronary calcium score/coronary CTA
interpretation by the cardiologist is attached.
CLINICAL DATA: Atrial fibrillation scheduled for left atrial
appendage closure.
Cardiac CT/CTA
TECHNIQUE: A non-contrast, gated CT scan was obtained with axial slices of 3 mm
through the heart for calcium scoring. Calcium scoring was performed
using the Agatston method. A 120 kV retrospective, gated, contrast
cardiac scan was obtained. Gantry rotation speed was 250 msecs and
collimation was 0.6 mm. Nitroglycerin was not given. A delayed scan
was obtained to exclude left atrial appendage thrombus. The 3D
dataset was reconstructed in 5% intervals of the 0-95% of the R-R
cycle. Late systolic phases were analyzed on a dedicated workstation
using MPR, MIP, and VRT modes. The patient received 80 cc of
contrast.

[Series 9: 0-90% · axial · 0.41mm/px · z∈[+1114,+1265]mm · 6 of 4230 slices shown, 8 images]
[im 605/4230  vessel]
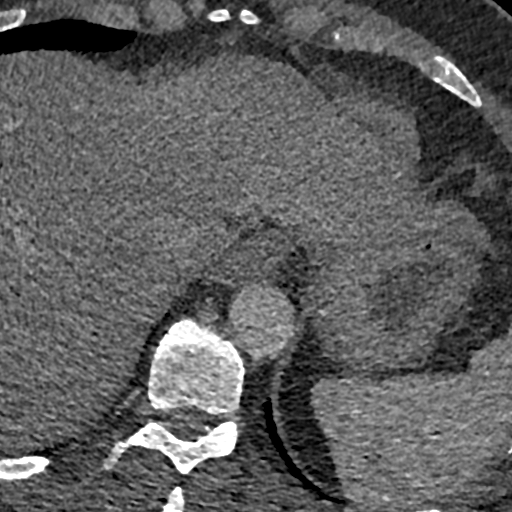
[im 605/4230  lung]
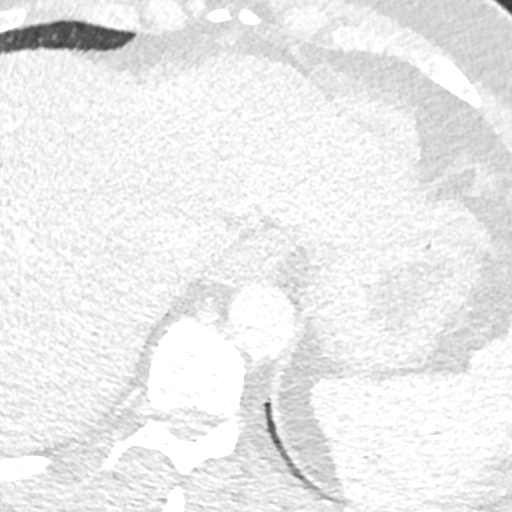
[im 1209/4230  vessel]
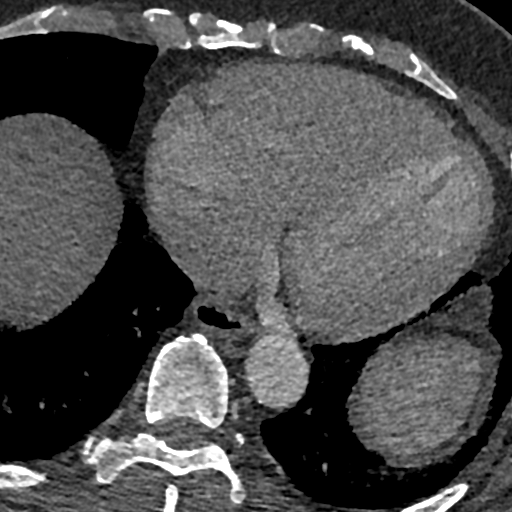
[im 1813/4230  vessel]
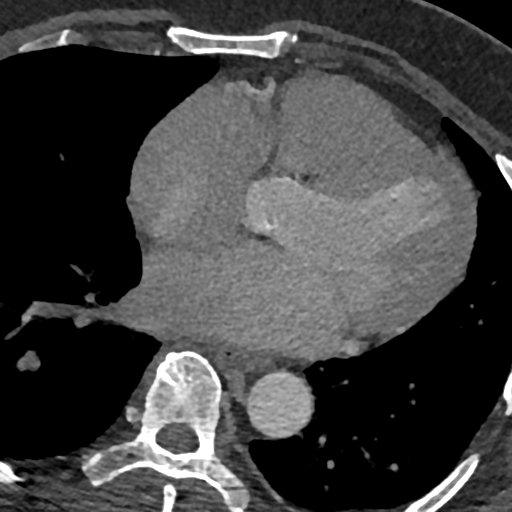
[im 2417/4230  vessel]
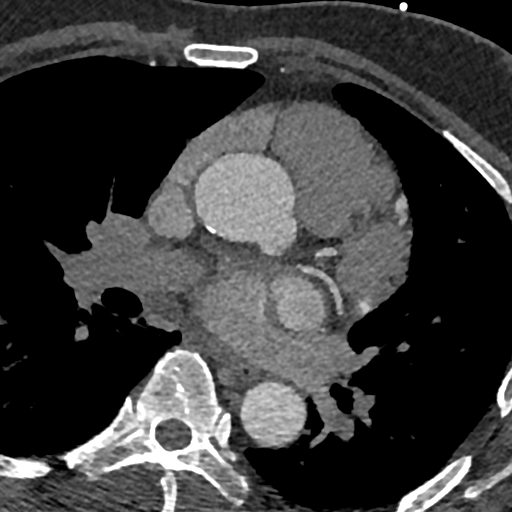
[im 3021/4230  vessel]
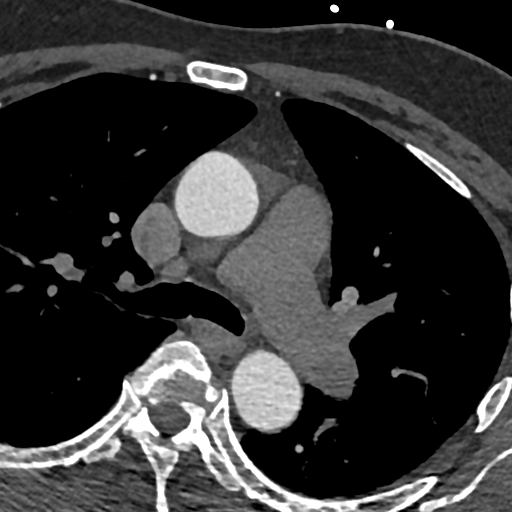
[im 3021/4230  lung]
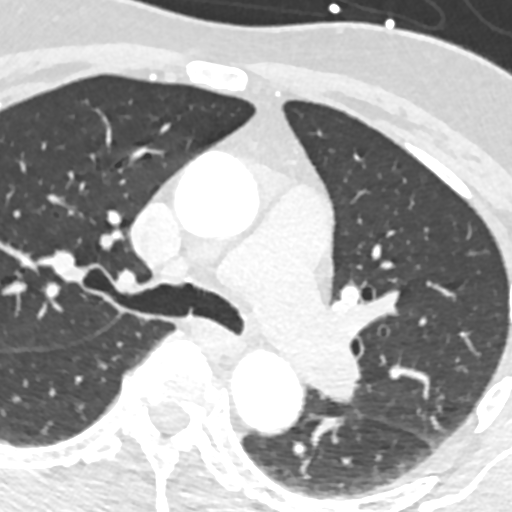
[im 3625/4230  vessel]
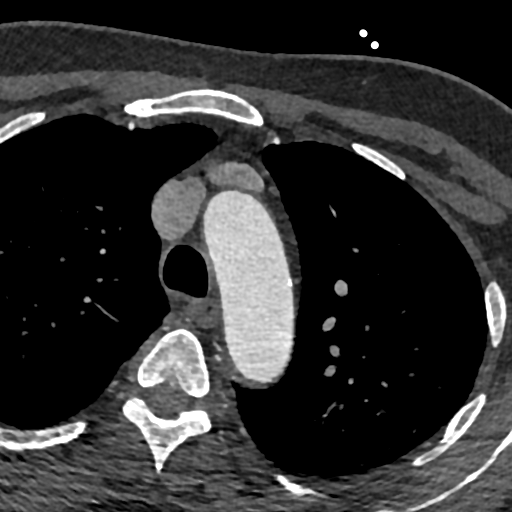

[Series 10: 5-95% · axial · 0.70mm/px · z∈[+1114,+1265]mm · 6 of 4230 slices shown]
[im 605/4230  vessel]
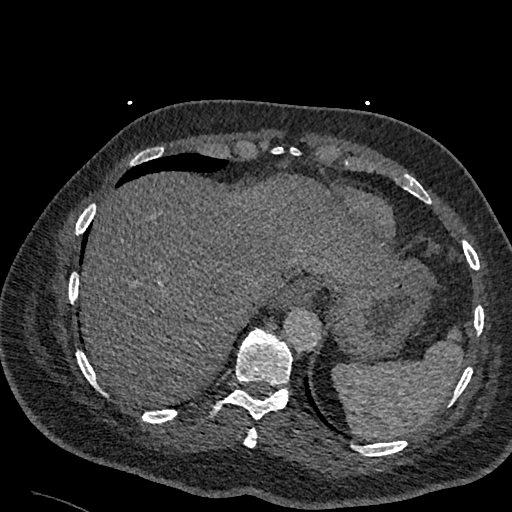
[im 1209/4230  vessel]
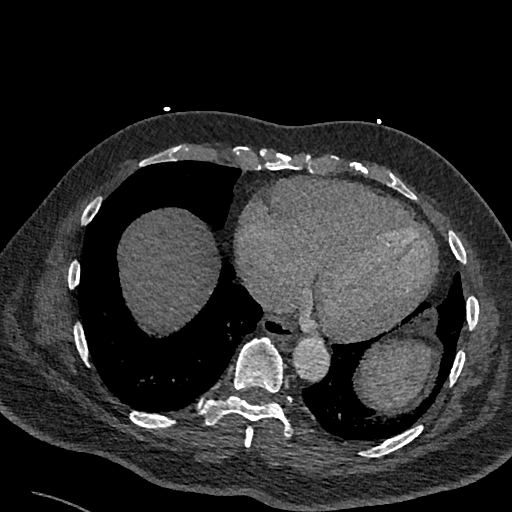
[im 1813/4230  vessel]
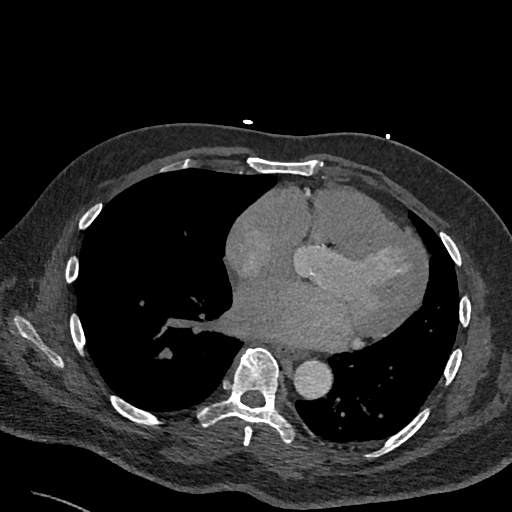
[im 2417/4230  vessel]
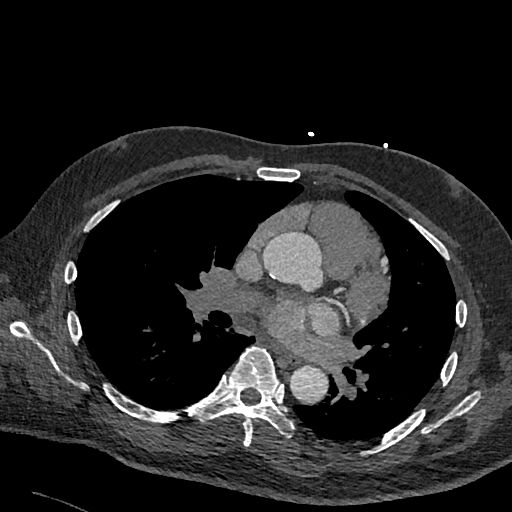
[im 3021/4230  vessel]
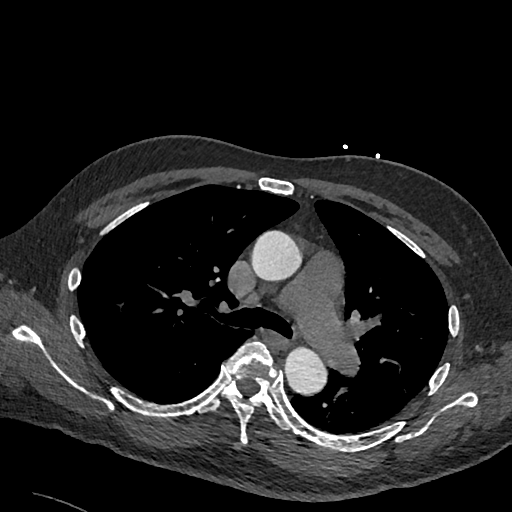
[im 3625/4230  vessel]
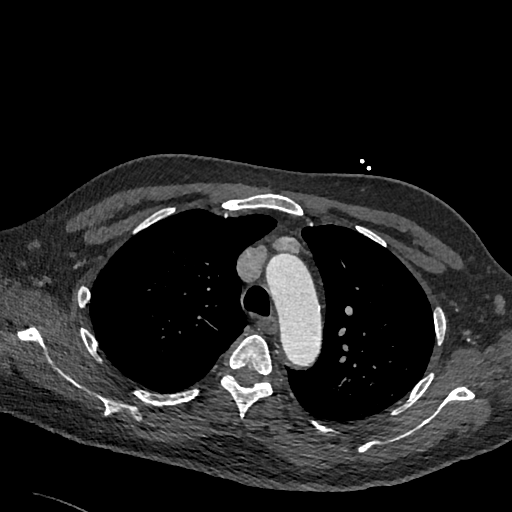

[12 of 20 positions shown; findings below may reference images not displayed]

FINDINGS: Atherosclerotic calcifications in the thoracic aorta. Within the
visualized portions of the thorax there are no suspicious appearing
pulmonary nodules or masses, there is no acute consolidative
airspace disease, no pleural effusions, no pneumothorax and no
lymphadenopathy. Visualized portions of the upper abdomen are
unremarkable. There are no aggressive appearing lytic or blastic
lesions noted in the visualized portions of the skeleton.
IMPRESSION: 1. Aortic Atherosclerosis (ZEGI4-TZ7.7).
FINDINGS: Image quality: Poor. Contrast bolus washed out of the heart during
for acquisition.

Noise artifact is: Moderate due to poor contrast to noise ratio.

Left Atrium: The left atrial size is normal. There is no PFO/ASD.
There are 2 right pulmonary veins. The left veins drain into 1
common pulmonary vein.

Left Atrial Appendage:

Morphology: The left atrial appendage is large chicken wing type
with two lobes.
Thrombus: There is no thrombus in the left atrial appendage on
contrast or delayed imaging.

The following measurements were made regarding left atrial appendage
closure:

Phase assessed: 50%

Landing Zone measurement: 24.7 mm x 21.1 mm

OLSON Length (maximum): 31.9 mm

Optimal interatrial septum puncture site: Inferior and posterior.

Optimal deployment angle: RAO 3 CEOLA 8

Catheter: A double curve catheter is recommended.

[place] Jabbi Device: A 31 mm device is recommended with 20%
compression.

Other comments: None.

Coronary Arteries: CAC score of 576, which is 83rd percentile for
age-, sex-, and race-matched controls. Normal coronary origin. Right
dominance. The study was performed without use of NTG and is
insufficient for plaque evaluation.

Right Atrium: Right atrial size is within normal limits.

Right Ventricle: The right ventricular cavity is within normal
limits.

Left Ventricle: The ventricular cavity size is within normal limits.
There are no stigmata of prior infarction. There is no abnormal
filling defect.

Pulmonary Artery: Normal caliber without proximal filling defect.

Cardiac valves: The aortic valve is trileaflet without significant
calcification. The mitral valve is normal structure without
significant calcification.

Aorta: Normal caliber with no significant disease.

Pericardium: Normal thickness with no significant effusion or
calcium present.

Extra-cardiac findings: See attached radiology report for
non-cardiac structures.
IMPRESSION: 1. The left atrial appendage is a large chicken wing morphology
without thrombus.

2. A 31 mm [place] device is recommended based on the above
landing zone measurements (24.7 mm maximum diameter; 20%
compression).

3. An inferior posterior TENKORANG puncture site is recommended.

5. Optimal deployment angle: RAO 3 CEOLA 8

6. CAC score of 576, which is 83rd percentile for age-, sex-, and
race-matched controls

*** End of Addendum ***
EXAM:
OVER-READ INTERPRETATION  CT CHEST

The following report is an over-read performed by radiologist Dr.
Michala Bobr [REDACTED] on 05/08/2021. This
over-read does not include interpretation of cardiac or coronary
anatomy or pathology. The coronary calcium score/coronary CTA
interpretation by the cardiologist is attached.
FINDINGS: Atherosclerotic calcifications in the thoracic aorta. Within the
visualized portions of the thorax there are no suspicious appearing
pulmonary nodules or masses, there is no acute consolidative
airspace disease, no pleural effusions, no pneumothorax and no
lymphadenopathy. Visualized portions of the upper abdomen are
unremarkable. There are no aggressive appearing lytic or blastic
lesions noted in the visualized portions of the skeleton.
IMPRESSION: 1. Aortic Atherosclerosis (ZEGI4-TZ7.7).

## 2023-08-31 DIAGNOSIS — D649 Anemia, unspecified: Secondary | ICD-10-CM | POA: Diagnosis not present

## 2023-08-31 DIAGNOSIS — R5383 Other fatigue: Secondary | ICD-10-CM | POA: Diagnosis not present

## 2023-08-31 DIAGNOSIS — D696 Thrombocytopenia, unspecified: Secondary | ICD-10-CM | POA: Diagnosis not present

## 2023-08-31 DIAGNOSIS — D7581 Myelofibrosis: Secondary | ICD-10-CM | POA: Diagnosis not present

## 2023-08-31 DIAGNOSIS — D471 Chronic myeloproliferative disease: Secondary | ICD-10-CM | POA: Diagnosis not present

## 2023-09-21 DIAGNOSIS — D649 Anemia, unspecified: Secondary | ICD-10-CM | POA: Diagnosis not present

## 2023-09-21 DIAGNOSIS — D7581 Myelofibrosis: Secondary | ICD-10-CM | POA: Diagnosis not present

## 2023-09-23 ENCOUNTER — Ambulatory Visit: Admitting: Family Medicine

## 2023-09-23 ENCOUNTER — Encounter: Payer: Self-pay | Admitting: Family Medicine

## 2023-09-23 VITALS — BP 122/64 | HR 83 | Temp 98.2°F | Ht 72.0 in | Wt 200.4 lb

## 2023-09-23 DIAGNOSIS — L237 Allergic contact dermatitis due to plants, except food: Secondary | ICD-10-CM | POA: Diagnosis not present

## 2023-09-23 MED ORDER — TRIAMCINOLONE ACETONIDE 0.5 % EX CREA: 1.0000 | TOPICAL_CREAM | Freq: Two times a day (BID) | CUTANEOUS | 1 refills | Status: DC | PRN

## 2023-09-23 MED ORDER — PREDNISONE 10 MG PO TABS: ORAL_TABLET | ORAL | 0 refills | Status: DC

## 2023-09-23 NOTE — Patient Instructions (Signed)
 Start prednisone with food.  If needed, then use the cream.   Please update me as needed.  Take care.  Glad to see you.

## 2023-09-23 NOTE — Progress Notes (Signed)
 Poison ivy exposure.  B ankle and R hip, L side of trunk.  Sig itching and sore.  Exposure 8 days ago.    No FCNAV.    He didn't need infusion yesterday at hematology clinic.  Most recent A1c based on fructosamine is ~6.5    Meds, vitals, and allergies reviewed.   ROS: Per HPI unless specifically indicated in ROS section   Nad Ncat Rrr Ctab He has gross dermatitis on the left lower side of the abdomen and the bilateral ankles.

## 2023-09-25 DIAGNOSIS — L237 Allergic contact dermatitis due to plants, except food: Secondary | ICD-10-CM | POA: Insufficient documentation

## 2023-09-25 NOTE — Assessment & Plan Note (Signed)
 Discussed options.  A1c is not significantly elevated and given the multiple sites of think it makes sense to use prednisone  at a relatively low dose. He agreed.  Steroid cautions discussed with patient. Start prednisone  with food.  If needed, then use triamcinolone  locally He can update me as needed.  He agrees to plan.

## 2023-10-12 DIAGNOSIS — D471 Chronic myeloproliferative disease: Secondary | ICD-10-CM | POA: Diagnosis not present

## 2023-10-12 DIAGNOSIS — D7581 Myelofibrosis: Secondary | ICD-10-CM | POA: Diagnosis not present

## 2023-10-19 NOTE — Telephone Encounter (Signed)
 Patient states that his appointments for lab and injection on 6/24 were to be cancelled.  Please advise.   Patient is calling from [phone]

## 2023-10-20 NOTE — Telephone Encounter (Signed)
 Appts cancelled

## 2023-11-10 ENCOUNTER — Ambulatory Visit

## 2023-11-10 VITALS — Ht 72.0 in | Wt 200.0 lb

## 2023-11-10 DIAGNOSIS — Z0000 Encounter for general adult medical examination without abnormal findings: Secondary | ICD-10-CM | POA: Diagnosis not present

## 2023-11-10 NOTE — Progress Notes (Signed)
 Subjective:   Paul Fry is a 78 y.o. who presents for a Medicare Wellness preventive visit.  As a reminder, Annual Wellness Visits don't include a physical exam, and some assessments may be limited, especially if this visit is performed virtually. We may recommend an in-person follow-up visit with your provider if needed.  Visit Complete: Virtual I connected with  Paul Fry on 11/10/23 by a audio enabled telemedicine application and verified that I am speaking with the correct person using two identifiers.  Patient Location: Home  Provider Location: Home Office  I discussed the limitations of evaluation and management by telemedicine. The patient expressed understanding and agreed to proceed.  Vital Signs: Because this visit was a virtual/telehealth visit, some criteria may be missing or patient reported. Any vitals not documented were not able to be obtained and vitals that have been documented are patient reported.  VideoDeclined- This patient declined Librarian, academic. Therefore the visit was completed with audio only.  Persons Participating in Visit: Patient.  AWV Questionnaire: No: Patient Medicare AWV questionnaire was not completed prior to this visit.  Cardiac Risk Factors include: advanced age (>62men, >77 women);diabetes mellitus;hypertension;male gender     Objective:    Today's Vitals   11/10/23 1507  Weight: 200 lb (90.7 kg)  Height: 6' (1.829 m)   Body mass index is 27.12 kg/m.     11/10/2023    3:17 PM 11/09/2022    8:17 AM 09/16/2022    9:33 AM 01/08/2022    8:54 AM 11/19/2021    9:16 AM 11/04/2021    9:20 AM 08/20/2021   11:41 AM  Advanced Directives  Does Patient Have a Medical Advance Directive? Yes Yes Yes Yes Yes Yes Yes  Type of Estate agent of [place];Living will Healthcare Power of [place];Living will Living will;Healthcare Power of [employer] Power of [place];Living will   Healthcare Power of [place];Living will   Does patient want to make changes to medical advance directive?      No - Patient declined   Copy of Healthcare Power of Attorney in Chart? No - copy requested No - copy requested  Yes - validated most recent copy scanned in chart (See row information)  No - copy requested     Current Medications (verified) Outpatient Encounter Medications as of 11/10/2023  Medication Sig   ascorbic acid (VITAMIN C) 1000 MG tablet Take 1,000 mg by mouth in the morning.   atorvastatin  (LIPITOR) 10 MG tablet Take 1 tablet (10 mg total) by mouth every morning.   losartan  (COZAAR ) 25 MG tablet TAKE 1/2 TABLET EVERY MORNING   metoprolol  succinate (TOPROL -XL) 50 MG 24 hr tablet TAKE 1 TABLET IN THE MORNING. TAKE 1/2 TABLET IN THE EVENING. TAKE WITH OR IMMEDIATELY FOLLOWING A MEAL.   momelotinib dihydrochloride (OJJAARA) 200 MG tablet Take 200 mg by mouth daily.   Multiple Minerals-Vitamins (CALCIUM  600+D3 PLUS MINERALS) TABS Take 1 tablet by mouth in the morning.   Multiple Vitamin (MULTIVITAMIN WITH MINERALS) TABS tablet Take 1 tablet by mouth daily. Centrum   Omega-3 Fatty Acids (FISH OIL PO) Take 1 capsule by mouth in the morning.   tamsulosin  (FLOMAX ) 0.4 MG CAPS capsule Take 2 capsules (0.8 mg total) by mouth daily.   triamcinolone  cream (KENALOG ) 0.5 % Apply 1 Application topically 2 (two) times daily as needed.   predniSONE  (DELTASONE ) 10 MG tablet Take 2 a day for 5 days, then 1 a day for 5 days, with food.  Don't take with aleve/ibuprofen. (Patient not taking: Reported on 11/10/2023)   No facility-administered encounter medications on file as of 11/10/2023.    Allergies (verified) Aspirin  and Atorvastatin    History: Past Medical History:  Diagnosis Date   Allergy    SEASONAL   Anemia    Arthritis    KNEES   BPH (benign prostatic hyperplasia)    Cancer (HCC)    Clotting disorder (HCC)    Difficult airway for intubation 08/25/2021   Diverticulosis of colon     Elevated PSA    1.22 on 01-20-2016   Feeling of incomplete bladder emptying    GERD (gastroesophageal reflux disease)    Hiatal hernia    History of adenomatous polyp of colon    2014 tubular adenoma   History of chronic gastritis    History of Helicobacter pylori infection    07/ 2016   History of lower GI bleeding    07/ 2010 and 12/ 2011  diverticular bleed both times   Hyperlipidemia    Hypertension    Lower urinary tract symptoms (LUTS)    Nonischemic cardiomyopathy (HCC)    Plantar fascia syndrome    Presence of Watchman left atrial appendage closure device 07/09/2021   Watchman FLX 31mm with Dr. Cindie   Strains to urinate    INTERMITTANT   Type 2 diabetes, diet controlled (HCC)    ON BORDER LINE,NEVER TOOK MEDICATION.   Wears glasses    Past Surgical History:  Procedure Laterality Date   APPENDECTOMY  teenager   ATRIAL FIBRILLATION ABLATION N/A 05/15/2021   Procedure: ATRIAL FIBRILLATION ABLATION;  Surgeon: Cindie Ole DASEN, MD;  Location: [facility];  Service: Cardiovascular;  Laterality: N/A;   BIOPSY  11/19/2021   Procedure: BIOPSY;  Surgeon: Aneita Gwendlyn DASEN, MD;  Location: [facility];  Service: Gastroenterology;;   CARDIAC CATHETERIZATION  09-10-2008   dr dalton mclean   mild non-obstructive CAD;  30-40% mRCA, 20%pLAD,  ef 55%   CARDIOVASCULAR STRESS TEST  07/29/2011   Low risk nuclear study w/ no ischemia or infarct/  LVEF 47% and  apical hypokinesis (08-07-2012 cardiac MRI -- EF 49% w/ no hyperenhancement distal septal and apical hypokinesis, mild LAE, mild RVE, mild AR with mild dilated sinus 39mm)   CATARACT EXTRACTION W/ INTRAOCULAR LENS  IMPLANT, BILATERAL  2016   COLONOSCOPY  last one 12-21-2012   COLONOSCOPY WITH PROPOFOL  N/A 11/19/2021   Procedure: COLONOSCOPY WITH PROPOFOL ;  Surgeon: Aneita Gwendlyn DASEN, MD;  Location: [facility];  Service: Gastroenterology;  Laterality: N/A;   CYSTOSCOPY WITH INSERTION OF UROLIFT N/A 06/17/2016    Procedure: CYSTOSCOPY WITH INSERTION OF UROLIFT;  Surgeon: Arlena Gal, MD;  Location: [facility];  Service: Urology;  Laterality: N/A;   ESOPHAGOGASTRODUODENOSCOPY  last one 11-22-2014   EYE SURGERY     LEFT ATRIAL APPENDAGE OCCLUSION N/A 07/09/2021   Procedure: LEFT ATRIAL APPENDAGE OCCLUSION;  Surgeon: Cindie Ole DASEN, MD;  Location: [facility];  Service: Cardiovascular;  Laterality: N/A;   POLYPECTOMY  11/19/2021   Procedure: POLYPECTOMY;  Surgeon: Aneita Gwendlyn DASEN, MD;  Location: [facility];  Service: Gastroenterology;;   TEE WITHOUT CARDIOVERSION N/A 07/09/2021   Procedure: TRANSESOPHAGEAL ECHOCARDIOGRAM (TEE);  Surgeon: Cindie Ole DASEN, MD;  Location: [facility] INVASIVE CV LAB;  Service: Cardiovascular;  Laterality: N/A;   TEE WITHOUT CARDIOVERSION N/A 08/20/2021   Procedure: TRANSESOPHAGEAL ECHOCARDIOGRAM (TEE);  Surgeon: Santo Stanly LABOR, MD;  Location: [facility] ENDOSCOPY;  Service: Cardiovascular;  Laterality: N/A;  TEE WITHOUT CARDIOVERSION N/A 01/08/2022   Procedure: TRANSESOPHAGEAL ECHOCARDIOGRAM (TEE);  Surgeon: Delford Maude BROCKS, MD;  Location: [facility] ENDOSCOPY;  Service: Cardiovascular;  Laterality: N/A;   TRANSTHORACIC ECHOCARDIOGRAM  09/17/2014   ef 50-55%/  trivial AR, MR and PR/  mild LAE and RAE/  mild TR   Family History  Problem Relation Age of Onset   Colon cancer Mother 12   Heart disease Sister        MI, pace maker   Kidney disease Sister        renal failure, nephrectomy- not cancer   Hypertension Sister    Hypertension Sister    Colon polyps Brother    Lung cancer Brother    Diabetes Neg Hx        DM   Depression Neg Hx    Stroke Neg Hx    Alcohol abuse Neg Hx        also no drug abuse    Prostate cancer Neg Hx    Liver cancer Neg Hx    Rectal cancer Neg Hx    Stomach cancer Neg Hx    Social History   Socioeconomic History   Marital status: Married    Spouse name: Not on file   Number of children: 0   Years of  education: Not on file   Highest education level: 12th grade  Occupational History   Occupation: truck Hospital doctor- retired    Associate Professor: RETIRED  Tobacco Use   Smoking status: Never   Smokeless tobacco: Never  Vaping Use   Vaping status: Never Used  Substance and Sexual Activity   Alcohol use: Not Currently    Comment: rarely drinks beer   Drug use: No   Sexual activity: Not Currently  Other Topics Concern   Not on file  Social History Narrative   Married 2003, no children.    Retired Naval architect, gets regular exercise- walking.    Step daughter is MD in [place]    Social Drivers of Health   Financial Resource Strain: Low Risk  (11/10/2023)   Overall Financial Resource Strain (CARDIA)    Difficulty of Paying Living Expenses: Not hard at all  Food Insecurity: No Food Insecurity (11/10/2023)   Hunger Vital Sign    Worried About Running Out of Food in the Last Year: Never true    Ran Out of Food in the Last Year: Never true  Transportation Needs: No Transportation Needs (11/10/2023)   PRAPARE - Administrator, Civil Service (Medical): No    Lack of Transportation (Non-Medical): No  Physical Activity: Sufficiently Active (11/10/2023)   Exercise Vital Sign    Days of Exercise per Week: 3 days    Minutes of Exercise per Session: 50 min  Stress: No Stress Concern Present (11/10/2023)   [employer] of Occupational Health - Occupational Stress Questionnaire    Feeling of Stress: Not at all  Social Connections: Moderately Integrated (11/10/2023)   Social Connection and Isolation Panel    Frequency of Communication with Friends and Family: More than three times a week    Frequency of Social Gatherings with Friends and Family: Once a week    Attends Religious Services: More than 4 times per year    Active Member of [employer] or Organizations: No    Attends Banker Meetings: Never    Marital Status: Married    Tobacco Counseling Counseling given: Not  Answered    Clinical Intake:  Pre-visit preparation completed: Yes  Pain :  No/denies pain     BMI - recorded: 27.12 Nutritional Status: BMI 25 -29 Overweight Nutritional Risks: None Diabetes: No  Lab Results  Component Value Date   HGBA1C 6.3 05/30/2023   HGBA1C 6.6 (H) 11/26/2022   HGBA1C 6.6 (H) 06/07/2022     How often do you need to have someone help you when you read instructions, pamphlets, or other written materials from your doctor or pharmacy?: 1 - Never  Interpreter Needed?: No  Comments: lives alone Information entered by :: B.Mickie Badders,LPN   Activities of Daily Living     11/10/2023    3:17 PM  In your present state of health, do you have any difficulty performing the following activities:  Hearing? 0  Vision? 0  Difficulty concentrating or making decisions? 0  Walking or climbing stairs? 0  Dressing or bathing? 0  Doing errands, shopping? 0  Preparing Food and eating ? N  Using the Toilet? N  In the past six months, have you accidently leaked urine? N  Do you have problems with loss of bowel control? N  Managing your Medications? N  Managing your Finances? N  Housekeeping or managing your Housekeeping? N    Patient Care Team: Cleatus Arlyss RAMAN, MD as PCP - General (Family Medicine) Cindie Ole DASEN, MD as PCP - Electrophysiology (Cardiology) Darron Deatrice LABOR, MD as PCP - Cardiology (Cardiology) Myra Rosaline FALCON, [facility] (Inactive) as Pharmacist (Pharmacist) Charmayne Arlyss, MD as Consulting Physician (Ophthalmology)  I have updated your Care Teams any recent Medical Services you may have received from other providers in the past year.     Assessment:   This is a routine wellness examination for Yared.  Hearing/Vision screen Vision Screening - Comments:: Pt says vision is adequate w/glasses Dr Charmayne   Goals Addressed               This Visit's Progress     COMPLETED: No current goal (pt-stated)        COMPLETED: Patient Stated         Starting 10/09/2018, I will continue to take medications as prescribed.       Patient Stated   On track     11/10/23-, I will maintain and continue medications as prescribed.       Patient Stated   On track     11/10/23* maintain current health       Depression Screen     11/10/2023    3:13 PM 09/23/2023    9:00 AM 06/09/2023    8:33 AM 12/03/2022   10:45 AM 11/09/2022    8:19 AM 09/17/2022    3:02 PM 08/17/2022    9:22 AM  PHQ 2/9 Scores  PHQ - 2 Score 0 0 2 2 0 0 0  PHQ- 9 Score  2 4 5 1 1      Fall Risk     11/10/2023    3:09 PM 09/23/2023    9:00 AM 06/09/2023    8:32 AM 12/03/2022   10:44 AM 11/08/2022    8:37 AM  Fall Risk   Falls in the past year? 0 0 0 0 0  Number falls in past yr: 0 0 0 0 0  Injury with Fall? 0 0 0 0 0  Risk for fall due to : No Fall Risks No Fall Risks No Fall Risks No Fall Risks Medication side effect  Follow up Education provided;Falls prevention discussed Falls evaluation completed Falls evaluation completed Falls evaluation completed Falls prevention discussed;Education  provided;Falls evaluation completed    MEDICARE RISK AT HOME:  Medicare Risk at Home Any stairs in or around the home?: No If so, are there any without handrails?: Yes Home free of loose throw rugs in walkways, pet beds, electrical cords, etc?: Yes Adequate lighting in your home to reduce risk of falls?: Yes Life alert?: No Use of a cane, walker or w/c?: No Grab bars in the bathroom?: Yes Shower chair or bench in shower?: No Elevated toilet seat or a handicapped toilet?: No  TIMED UP AND GO:  Was the test performed?  No  Cognitive Function: 6CIT completed    10/16/2019   10:40 AM 10/09/2018    9:41 AM 07/22/2017   10:00 AM  MMSE - Mini Mental State Exam  Orientation to time 5 5 5   Orientation to Place 5 5 5   Registration 3 3 3   Attention/ Calculation 5 0 0  Recall 3 3 3   Language- name 2 objects  0 0  Language- repeat 1 1 1   Language- follow 3 step command  0 3  Language-  read & follow direction  0 0  Write a sentence  0 0  Copy design  0 0  Total score  17 20        11/10/2023    3:20 PM 11/09/2022    8:19 AM 11/04/2021    9:20 AM  6CIT Screen  What Year? 0 points 0 points 0 points  What month? 0 points 0 points 0 points  What time? 0 points 0 points 0 points  Count back from 20 0 points 0 points 0 points  Months in reverse 0 points 0 points 0 points  Repeat phrase 0 points 2 points 0 points  Total Score 0 points 2 points 0 points    Immunizations Immunization History  Administered Date(s) Administered   Fluad Quad(high Dose 65+) 02/18/2020, 02/16/2022   Hepatitis A, Adult 11/07/2014   Influenza Split 05/25/2011   Influenza Whole 04/25/2010   Influenza, High Dose Seasonal PF 02/16/2022, 02/07/2023   Influenza, Seasonal, Injecte, Preservative Fre 05/25/2012   Influenza,inj,Quad PF,6+ Mos 02/26/2014, 02/05/2016, 01/31/2017, 02/23/2018   Influenza-Unspecified 03/07/2021   Moderna Covid-19 Fall Seasonal Vaccine 72yrs & older 02/16/2022, 02/07/2023   Moderna Covid-19 Vaccine  Bivalent Booster 40yrs & up 11/06/2021   Moderna Sars-Covid-2 Vaccination 06/15/2019, 06/25/2019, 07/11/2019, 07/23/2019, 03/30/2020, 08/26/2020   Pfizer Covid-19 Vaccine Bivalent Booster 82yrs & up 02/12/2021   Pneumococcal Conjugate-13 07/16/2014   Pneumococcal Polysaccharide-23 04/25/2010, 07/17/2015   Rsv, Bivalent, Protein Subunit Rsvpref,pf [place]) 06/18/2022   Td 05/11/1998, 09/18/2008   Zoster Recombinant(Shingrix) 10/08/2021, 02/16/2022   Zoster, Live 06/17/2011    Screening Tests Health Maintenance  Topic Date Due   DTaP/Tdap/Td (3 - Tdap) 09/19/2018   Colonoscopy  11/20/2022   OPHTHALMOLOGY EXAM  12/01/2022   COVID-19 Vaccine (11 - Moderna risk 2024-25 season) 08/07/2023   Diabetic kidney evaluation - Urine ACR  11/26/2023   HEMOGLOBIN A1C  11/27/2023   FOOT EXAM  12/03/2023   INFLUENZA VACCINE  12/09/2023   Diabetic kidney evaluation - eGFR measurement   05/29/2024   Medicare Annual Wellness (AWV)  11/09/2024   Pneumococcal Vaccine: 50+ Years  Completed   Hepatitis C Screening  Completed   Zoster Vaccines- Shingrix  Completed   Hepatitis B Vaccines  Aged Out   HPV VACCINES  Aged Out   Meningococcal B Vaccine  Aged Out    Health Maintenance  Health Maintenance Due  Topic Date Due   DTaP/Tdap/Td (  3 - Tdap) 09/19/2018   Colonoscopy  11/20/2022   OPHTHALMOLOGY EXAM  12/01/2022   COVID-19 Vaccine (11 - Moderna risk 2024-25 season) 08/07/2023   Diabetic kidney evaluation - Urine ACR  11/26/2023   Health Maintenance Items Addressed: None at this time  Additional Screening:  Vision Screening: Recommended annual ophthalmology exams for early detection of glaucoma and other disorders of the eye. Would you like a referral to an eye doctor? No    Dental Screening: Recommended annual dental exams for proper oral hygiene  Community Resource Referral / Chronic Care Management: CRR required this visit?  No   CCM required this visit?  No   Plan:    I have personally reviewed and noted the following in the patient's chart:   Medical and social history Use of alcohol, tobacco or illicit drugs  Current medications and supplements including opioid prescriptions. Patient is not currently taking opioid prescriptions. Functional ability and status Nutritional status Physical activity Advanced directives List of other physicians Hospitalizations, surgeries, and ER visits in previous 12 months Vitals Screenings to include cognitive, depression, and falls Referrals and appointments  In addition, I have reviewed and discussed with patient certain preventive protocols, quality metrics, and best practice recommendations. A written personalized care plan for preventive services as well as general preventive health recommendations were provided to patient.   Erminio LITTIE Saris, LPN   06/15/7972   After Visit Summary: (MyChart) Due to this  being a telephonic visit, the after visit summary with patients personalized plan was offered to patient via MyChart   Notes: Nothing significant to report at this time.

## 2023-11-10 NOTE — Patient Instructions (Signed)
 [name] , Thank you for taking time out of your busy schedule to complete your Annual Wellness Visit with me. I enjoyed our conversation and look forward to speaking with you again next year. I, as well as your care team,  appreciate your ongoing commitment to your health goals. Please review the following plan we discussed and let me know if I can assist you in the future. Your Game plan/ To Do List     Follow up Visits: Next Medicare AWV with our clinical staff: 11/14/24 @ 8:10am televisit   Have you seen your provider in the last 6 months (3 months if uncontrolled diabetes)? Yes Next Office Visit with your provider: 12/06/23 physical  Clinician Recommendations:  Aim for 30 minutes of exercise or brisk walking, 6-8 glasses of water , and 5 servings of fruits and vegetables each day.       This is a list of the screening recommended for you and due dates:  Health Maintenance  Topic Date Due   DTaP/Tdap/Td vaccine (3 - Tdap) 09/19/2018   Colon Cancer Screening  11/20/2022   Eye exam for diabetics  12/01/2022   COVID-19 Vaccine (11 - Moderna risk 2024-25 season) 08/07/2023   Yearly kidney health urinalysis for diabetes  11/26/2023   Hemoglobin A1C  11/27/2023   Complete foot exam   12/03/2023   Flu Shot  12/09/2023   Yearly kidney function blood test for diabetes  05/29/2024   Medicare Annual Wellness Visit  11/09/2024   Pneumococcal Vaccine for age over 64  Completed   Hepatitis C Screening  Completed   Zoster (Shingles) Vaccine  Completed   Hepatitis B Vaccine  Aged Out   HPV Vaccine  Aged Out   Meningitis B Vaccine  Aged Out    Advanced directives: (Copy Requested) Please bring a copy of your health care power of attorney and living will to the office to be added to your chart at your convenience. You can mail to [facility] [address] [place], [place] [address] or email to [email or web address] Advance Care Planning is important because it:  [x]  Makes  sure you receive the medical care that is consistent with your values, goals, and preferences  [x]  It provides guidance to your family and loved ones and reduces their decisional burden about whether or not they are making the right decisions based on your wishes.  Follow the link provided in your after visit summary or read over the paperwork we have mailed to you to help you started getting your Advance Directives in place. If you need assistance in completing these, please reach out to us  so that we can help you!

## 2023-11-27 ENCOUNTER — Other Ambulatory Visit: Payer: Self-pay | Admitting: Family Medicine

## 2023-11-27 DIAGNOSIS — Z125 Encounter for screening for malignant neoplasm of prostate: Secondary | ICD-10-CM

## 2023-11-27 DIAGNOSIS — E119 Type 2 diabetes mellitus without complications: Secondary | ICD-10-CM

## 2023-11-27 DIAGNOSIS — E7800 Pure hypercholesterolemia, unspecified: Secondary | ICD-10-CM

## 2023-11-28 DIAGNOSIS — R197 Diarrhea, unspecified: Secondary | ICD-10-CM | POA: Diagnosis not present

## 2023-11-28 DIAGNOSIS — D7581 Myelofibrosis: Secondary | ICD-10-CM | POA: Diagnosis not present

## 2023-11-28 DIAGNOSIS — D61811 Other drug-induced pancytopenia: Secondary | ICD-10-CM | POA: Diagnosis not present

## 2023-11-29 ENCOUNTER — Other Ambulatory Visit (INDEPENDENT_AMBULATORY_CARE_PROVIDER_SITE_OTHER): Payer: Medicare Other

## 2023-11-29 DIAGNOSIS — E119 Type 2 diabetes mellitus without complications: Secondary | ICD-10-CM | POA: Diagnosis not present

## 2023-11-29 DIAGNOSIS — Z125 Encounter for screening for malignant neoplasm of prostate: Secondary | ICD-10-CM | POA: Diagnosis not present

## 2023-11-29 DIAGNOSIS — E7800 Pure hypercholesterolemia, unspecified: Secondary | ICD-10-CM

## 2023-11-29 LAB — LIPID PANEL
Cholesterol: 144 mg/dL (ref 0–200)
HDL: 32.6 mg/dL — ABNORMAL LOW (ref >39.00)
LDL Cholesterol: 92 mg/dL (ref 0–99)
NonHDL: 111.42
Total CHOL/HDL Ratio: 4
Triglycerides: 96 mg/dL (ref 0.0–149.0)
VLDL: 19.2 mg/dL (ref 0.0–40.0)

## 2023-11-29 LAB — CBC WITH DIFFERENTIAL/PLATELET
Basophils Absolute: 0 K/uL (ref 0.0–0.1)
Basophils Relative: 0.6 % (ref 0.0–3.0)
Eosinophils Absolute: 0 K/uL (ref 0.0–0.7)
Eosinophils Relative: 0.3 % (ref 0.0–5.0)
HCT: 31.4 % — ABNORMAL LOW (ref 39.0–52.0)
Hemoglobin: 10.3 g/dL — ABNORMAL LOW (ref 13.0–17.0)
Lymphocytes Relative: 52 % — ABNORMAL HIGH (ref 12.0–46.0)
Lymphs Abs: 1.8 K/uL (ref 0.7–4.0)
MCHC: 32.9 g/dL (ref 30.0–36.0)
MCV: 82.8 fl (ref 78.0–100.0)
Monocytes Absolute: 0.1 K/uL (ref 0.1–1.0)
Monocytes Relative: 4.2 % (ref 3.0–12.0)
Neutro Abs: 1.5 K/uL (ref 1.4–7.7)
Neutrophils Relative %: 42.9 % — ABNORMAL LOW (ref 43.0–77.0)
Platelets: 253 K/uL (ref 150.0–400.0)
RBC: 3.79 Mil/uL — ABNORMAL LOW (ref 4.22–5.81)
RDW: 26.7 % — ABNORMAL HIGH (ref 11.5–15.5)
WBC: 3.6 K/uL — ABNORMAL LOW (ref 4.0–10.5)

## 2023-11-29 LAB — COMPREHENSIVE METABOLIC PANEL WITH GFR
ALT: 23 U/L (ref 0–53)
AST: 25 U/L (ref 0–37)
Albumin: 4.3 g/dL (ref 3.5–5.2)
Alkaline Phosphatase: 48 U/L (ref 39–117)
BUN: 13 mg/dL (ref 6–23)
CO2: 29 meq/L (ref 19–32)
Calcium: 9 mg/dL (ref 8.4–10.5)
Chloride: 104 meq/L (ref 96–112)
Creatinine, Ser: 0.97 mg/dL (ref 0.40–1.50)
GFR: 75.02 mL/min (ref >60.00)
Glucose, Bld: 118 mg/dL — ABNORMAL HIGH (ref 70–99)
Potassium: 4.2 meq/L (ref 3.5–5.1)
Sodium: 140 meq/L (ref 135–145)
Total Bilirubin: 0.8 mg/dL (ref 0.2–1.2)
Total Protein: 7.1 g/dL (ref 6.0–8.3)

## 2023-11-29 LAB — HEMOGLOBIN A1C: Hgb A1c MFr Bld: 6.3 % (ref 4.6–6.5)

## 2023-11-29 LAB — MICROALBUMIN / CREATININE URINE RATIO
Creatinine,U: 101.1 mg/dL
Microalb Creat Ratio: 10.9 mg/g (ref 0.0–30.0)
Microalb, Ur: 1.1 mg/dL (ref 0.0–1.9)

## 2023-11-29 LAB — PSA, MEDICARE: PSA: 1.58 ng/mL (ref 0.10–4.00)

## 2023-12-03 LAB — FRUCTOSAMINE: Fructosamine: 285 umol/L (ref 205–285)

## 2023-12-04 ENCOUNTER — Ambulatory Visit: Payer: Self-pay | Admitting: Family Medicine

## 2023-12-06 ENCOUNTER — Encounter: Payer: Self-pay | Admitting: Family Medicine

## 2023-12-06 ENCOUNTER — Ambulatory Visit (INDEPENDENT_AMBULATORY_CARE_PROVIDER_SITE_OTHER): Payer: Medicare Other | Admitting: Family Medicine

## 2023-12-06 VITALS — BP 118/68 | HR 65 | Temp 97.7°F | Resp 20 | Ht 72.0 in | Wt 203.2 lb

## 2023-12-06 DIAGNOSIS — I10 Essential (primary) hypertension: Secondary | ICD-10-CM

## 2023-12-06 DIAGNOSIS — E7800 Pure hypercholesterolemia, unspecified: Secondary | ICD-10-CM | POA: Diagnosis not present

## 2023-12-06 DIAGNOSIS — E119 Type 2 diabetes mellitus without complications: Secondary | ICD-10-CM

## 2023-12-06 DIAGNOSIS — Z7189 Other specified counseling: Secondary | ICD-10-CM

## 2023-12-06 DIAGNOSIS — N400 Enlarged prostate without lower urinary tract symptoms: Secondary | ICD-10-CM | POA: Diagnosis not present

## 2023-12-06 DIAGNOSIS — D7581 Myelofibrosis: Secondary | ICD-10-CM

## 2023-12-06 DIAGNOSIS — Z0000 Encounter for general adult medical examination without abnormal findings: Secondary | ICD-10-CM

## 2023-12-06 MED ORDER — TAMSULOSIN HCL 0.4 MG PO CAPS: 0.8000 mg | ORAL_CAPSULE | Freq: Every day | ORAL | 3 refills | Status: AC

## 2023-12-06 MED ORDER — METOPROLOL SUCCINATE ER 50 MG PO TB24: ORAL_TABLET | ORAL | 3 refills | Status: DC

## 2023-12-06 NOTE — Patient Instructions (Addendum)
 Please ask about seeing Dr. Watt about your feet.   Take care.  Glad to see you. Update me as needed.  Try to get a nonfasting lab visit in about 6 months, about 1 week prior to an office visit.  I would get a flu shot each fall.

## 2023-12-06 NOTE — Progress Notes (Unsigned)
 Diabetes:  No meds.  Labs d/w pt.   Hypoglycemic episodes: no Hyperglycemic episodes:no Feet problems: needs new inserts, d/w pt.  Pain walking on concrete.  H/o plantar fasciitis.  Some occ numbness.   Blood Sugars averaging: usually <100.   eye exam within last year: f/u pending for 01/2024.   Labs discussed with patient.  Hematology f/u d/w pt.  Prev diarrhea resolved.  Labs d/w pt.  Has f/u pending.  No transfusions in the last few months.    Elevated Cholesterol: Using medications without problems: yes Muscle aches: no Diet compliance: yes Exercise: yes  Hypertension:    Using medication without problems or lightheadedness: yes Chest pain with exertion:no Edema:no Short of breath: not with exertion.   BP controlled with metoprolol  daily, 1 tab a day.   LUTS improved with 0.8mg  flomax .  No ADE on med.    Flu encouraged Shingrix done PNA UTD Tetanus 2010, dw pt.   covid vaccine prev done.  RSV prev done Colonoscopy 2023 PSA wnl 2025.  D/w pt.   Advance directive- niece Kimon Loewen designated if patient were incapacitated.   PMH and SH reviewed  Meds, vitals, and allergies reviewed.   ROS: Per HPI unless specifically indicated in ROS section   GEN: nad, alert and oriented HEENT: mucous membranes moist NECK: supple w/o LA CV: rrr. PULM: ctab, no inc wob ABD: soft, +bs EXT: no edema SKIN: no acute rash  Diabetic foot exam: Normal inspection No skin breakdown No calluses  Normal DP pulses Normal sensation to light touch and monofilament Nails normal

## 2023-12-07 NOTE — Assessment & Plan Note (Signed)
 No change in meds at this point.  Footcare discussed with patient.  I asked him to follow Dr. Watt about his inserts. Plan for nonfasting lab visit in about 6 months, about 1 week prior to an office visit.

## 2023-12-07 NOTE — Assessment & Plan Note (Signed)
 Fortunately not requiring transfusion recently.  Labs discussed with patient.  He is no follow-up with hematology.

## 2023-12-07 NOTE — Assessment & Plan Note (Signed)
 Flu encouraged Shingrix done PNA UTD Tetanus 2010, dw pt.   covid vaccine prev done.  RSV prev done Colonoscopy 2023 PSA wnl 2025.  D/w pt.   Advance directive- niece Deaire Mcwhirter designated if patient were incapacitated.

## 2023-12-07 NOTE — Assessment & Plan Note (Signed)
Advance directive- niece Janice Borntreger designated if patient were incapacitated.  

## 2023-12-07 NOTE — Assessment & Plan Note (Signed)
  LUTS improved with 0.8mg  flomax .  No ADE on med.   Continue as is.

## 2023-12-07 NOTE — Assessment & Plan Note (Signed)
Continue Lipitor.  Labs discussed with patient. 

## 2023-12-07 NOTE — Assessment & Plan Note (Signed)
 Continue metoprolol  and losartan  as is.  Labs discussed with patient.

## 2023-12-15 ENCOUNTER — Ambulatory Visit: Admitting: Family Medicine

## 2023-12-25 NOTE — Progress Notes (Unsigned)
     Paul Fry T. Paul Rawdon, MD, CAQ Sports Medicine [facility] at [facility] [address] [place], [address]  Phone: [phone]  FAX: [phone]  Paul Fry - 78 y.o. male  MRN [id number]  Date of Birth: [birth year]  Date: 12/28/2023  PCP: Paul Arlyss RAMAN, MD  Referral: Paul Arlyss RAMAN, MD  No chief complaint on file.  Subjective:   Paul Fry is a 78 y.o. very pleasant male patient with There is no height or weight on file to calculate BMI. who presents with the following:  Discussed the use of AI scribe software for clinical note transcription with the patient, who gave verbal consent to proceed.  Norleen is here for Forensic psychologist    History of Present Illness      Review of Systems is noted in the HPI, as appropriate  Objective:   There were no vitals taken for this visit.  GEN: No acute distress; alert,appropriate. PULM: Breathing comfortably in no respiratory distress PSYCH: Normally interactive.   Physical Exam   Laboratory and Imaging Data:  Results   Assessment and Plan:   No diagnosis found.  Assessment and Plan Assessment & Plan      Medication Management during today's office visit: No orders of the defined types were placed in this encounter.  There are no discontinued medications.  Orders placed today for conditions managed today: No orders of the defined types were placed in this encounter.   Disposition: No follow-ups on file.  [employer] One speech-to-text software was used for transcription in this dictation.  Possible transcriptional errors can occur using Animal nutritionist.   Signed,  Jacques DASEN. Micaela Stith, MD   Outpatient Encounter Medications as of 12/28/2023  Medication Sig   ascorbic acid (VITAMIN C) 1000 MG tablet Take 1,000 mg by mouth in the morning.   atorvastatin  (LIPITOR) 10 MG tablet Take 1 tablet (10 mg total) by mouth every morning.   losartan  (COZAAR ) 25 MG  tablet TAKE 1/2 TABLET EVERY MORNING   metoprolol  succinate (TOPROL -XL) 50 MG 24 hr tablet TAKE 1 TABLET IN THE MORNING. TAKE WITH OR IMMEDIATELY FOLLOWING A MEAL.   momelotinib dihydrochloride (OJJAARA) 200 MG tablet Take 200 mg by mouth daily.   Multiple Minerals-Vitamins (CALCIUM  600+D3 PLUS MINERALS) TABS Take 1 tablet by mouth in the morning.   Multiple Vitamin (MULTIVITAMIN WITH MINERALS) TABS tablet Take 1 tablet by mouth daily. Centrum   Omega-3 Fatty Acids (FISH OIL PO) Take 1 capsule by mouth in the morning.   tamsulosin  (FLOMAX ) 0.4 MG CAPS capsule Take 2 capsules (0.8 mg total) by mouth daily.   triamcinolone  cream (KENALOG ) 0.5 % Apply 1 Application topically 2 (two) times daily as needed.   No facility-administered encounter medications on file as of 12/28/2023.

## 2023-12-28 ENCOUNTER — Encounter: Payer: Self-pay | Admitting: Family Medicine

## 2023-12-28 ENCOUNTER — Ambulatory Visit (INDEPENDENT_AMBULATORY_CARE_PROVIDER_SITE_OTHER): Admitting: Family Medicine

## 2023-12-28 VITALS — BP 142/89 | HR 67 | Temp 97.8°F | Ht 72.0 in | Wt 205.1 lb

## 2023-12-28 DIAGNOSIS — G8929 Other chronic pain: Secondary | ICD-10-CM | POA: Diagnosis not present

## 2023-12-28 DIAGNOSIS — M7742 Metatarsalgia, left foot: Secondary | ICD-10-CM | POA: Diagnosis not present

## 2023-12-28 DIAGNOSIS — M7741 Metatarsalgia, right foot: Secondary | ICD-10-CM | POA: Diagnosis not present

## 2023-12-28 DIAGNOSIS — M79673 Pain in unspecified foot: Secondary | ICD-10-CM | POA: Diagnosis not present

## 2023-12-28 DIAGNOSIS — M722 Plantar fascial fibromatosis: Secondary | ICD-10-CM

## 2024-01-11 DIAGNOSIS — D7581 Myelofibrosis: Secondary | ICD-10-CM | POA: Diagnosis not present

## 2024-01-11 DIAGNOSIS — D649 Anemia, unspecified: Secondary | ICD-10-CM | POA: Diagnosis not present

## 2024-01-11 DIAGNOSIS — R5383 Other fatigue: Secondary | ICD-10-CM | POA: Diagnosis not present

## 2024-01-11 DIAGNOSIS — D696 Thrombocytopenia, unspecified: Secondary | ICD-10-CM | POA: Diagnosis not present

## 2024-01-25 DIAGNOSIS — H26492 Other secondary cataract, left eye: Secondary | ICD-10-CM | POA: Diagnosis not present

## 2024-01-25 DIAGNOSIS — Z961 Presence of intraocular lens: Secondary | ICD-10-CM | POA: Diagnosis not present

## 2024-01-25 DIAGNOSIS — H52203 Unspecified astigmatism, bilateral: Secondary | ICD-10-CM | POA: Diagnosis not present

## 2024-01-25 LAB — OPHTHALMOLOGY REPORT-SCANNED

## 2024-02-09 DIAGNOSIS — H26492 Other secondary cataract, left eye: Secondary | ICD-10-CM | POA: Diagnosis not present

## 2024-02-14 ENCOUNTER — Ambulatory Visit (INDEPENDENT_AMBULATORY_CARE_PROVIDER_SITE_OTHER)

## 2024-02-14 DIAGNOSIS — Z23 Encounter for immunization: Secondary | ICD-10-CM

## 2024-02-14 NOTE — Progress Notes (Signed)
 Per orders of Dr. Arlyss Solian, injection of High Dose Flu Vaccine given by Nellie Hummer in left deltoid. Patient tolerated injection well.

## 2024-02-22 DIAGNOSIS — D7581 Myelofibrosis: Secondary | ICD-10-CM | POA: Diagnosis not present

## 2024-02-22 DIAGNOSIS — D696 Thrombocytopenia, unspecified: Secondary | ICD-10-CM | POA: Diagnosis not present

## 2024-03-08 DIAGNOSIS — D7581 Myelofibrosis: Secondary | ICD-10-CM | POA: Diagnosis not present

## 2024-03-21 DIAGNOSIS — D696 Thrombocytopenia, unspecified: Secondary | ICD-10-CM | POA: Diagnosis not present

## 2024-03-21 DIAGNOSIS — D7581 Myelofibrosis: Secondary | ICD-10-CM | POA: Diagnosis not present

## 2024-03-21 DIAGNOSIS — R5383 Other fatigue: Secondary | ICD-10-CM | POA: Diagnosis not present

## 2024-03-22 DIAGNOSIS — Z23 Encounter for immunization: Secondary | ICD-10-CM | POA: Diagnosis not present

## 2024-04-03 NOTE — Progress Notes (Unsigned)
  Electrophysiology Office Follow up Visit Note:    Date:  04/04/2024   ID:  Paul Fry, DOB 15-May-1945, MRN [id number]  PCP:  Cleatus Arlyss RAMAN, MD  [facility] Cardiologist:  Deatrice Cage, MD  [facility] Electrophysiologist:  OLE ONEIDA HOLTS, MD    Interval History:     Paul Fry is a 78 y.o. male who presents for a follow up visit.   The patient last saw Elvie January 20, 2023.  The patient has a history of atrial fibrillation and flutter with prior catheter ablation.  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  At the last appointment he was doing well without EP complaints.  He is doing well today.  No sustained arrhythmias that he is aware of.       Past medical, surgical, social and family history were reviewed.  ROS:   Please see the history of present illness.    All other systems reviewed and are negative.  EKGs/Labs/Other Studies Reviewed:    The following studies were reviewed today:          Physical Exam:    VS:  BP 120/80 (BP Location: Left Arm, Patient Position: Sitting, Cuff Size: Normal)   Pulse 65 Comment: 75 oximeter  Ht 6' (1.829 m)   Wt 206 lb 3.2 oz (93.5 kg)   SpO2 94%   BMI 27.97 kg/m     Wt Readings from Last 3 Encounters:  04/04/24 206 lb 3.2 oz (93.5 kg)  12/28/23 205 lb 2 oz (93 kg)  12/06/23 203 lb 4 oz (92.2 kg)     GEN: no distress CARD: RRR, No MRG RESP: No IWOB. CTAB.      ASSESSMENT:    1. Paroxysmal atrial fibrillation (HCC)   2. Primary hypertension    PLAN:    In order of problems listed above:  #History of atrial fibrillation and flutter #Watchman in situ Doing well from an arrhythmia perspective.  Off anticoagulation/antiplatelet given history of GI bleeding and Watchman in situ.  #Hypertension At goal today.  Recommend checking blood pressures 1-2 times per week at home and recording the values.  Recommend bringing these recordings to  the primary care physician. Continue losartan   I discussed my upcoming departure from [facility] during today's clinic appointment.  The patient will continue to follow-up with one of my EP partners moving forward.  Follow-up 1 year with EP APP  Signed, Ole Holts, MD, [facility], [facility] 04/04/2024 10:00 AM    Electrophysiology [facility]

## 2024-04-04 ENCOUNTER — Encounter: Payer: Self-pay | Admitting: Cardiology

## 2024-04-04 ENCOUNTER — Ambulatory Visit: Attending: Cardiology | Admitting: Cardiology

## 2024-04-04 VITALS — BP 120/80 | HR 65 | Ht 72.0 in | Wt 206.2 lb

## 2024-04-04 DIAGNOSIS — I480 Paroxysmal atrial fibrillation: Secondary | ICD-10-CM | POA: Insufficient documentation

## 2024-04-04 DIAGNOSIS — I10 Essential (primary) hypertension: Secondary | ICD-10-CM | POA: Insufficient documentation

## 2024-04-04 NOTE — Patient Instructions (Signed)

## 2024-05-28 ENCOUNTER — Other Ambulatory Visit: Payer: Self-pay | Admitting: Family Medicine

## 2024-05-28 ENCOUNTER — Other Ambulatory Visit: Payer: Self-pay | Admitting: Cardiology

## 2024-05-28 DIAGNOSIS — E7800 Pure hypercholesterolemia, unspecified: Secondary | ICD-10-CM

## 2024-05-28 DIAGNOSIS — I429 Cardiomyopathy, unspecified: Secondary | ICD-10-CM

## 2024-05-31 ENCOUNTER — Other Ambulatory Visit (INDEPENDENT_AMBULATORY_CARE_PROVIDER_SITE_OTHER)

## 2024-05-31 DIAGNOSIS — E119 Type 2 diabetes mellitus without complications: Secondary | ICD-10-CM

## 2024-05-31 LAB — HEMOGLOBIN A1C: Hgb A1c MFr Bld: 6.2 % (ref 4.6–6.5)

## 2024-06-01 ENCOUNTER — Ambulatory Visit: Payer: Self-pay | Admitting: Family Medicine

## 2024-06-06 LAB — FRUCTOSAMINE: Fructosamine: 325 umol/L — ABNORMAL HIGH (ref 205–285)

## 2024-06-07 ENCOUNTER — Encounter: Payer: Self-pay | Admitting: Family Medicine

## 2024-06-07 ENCOUNTER — Ambulatory Visit: Admitting: Family Medicine

## 2024-06-07 VITALS — BP 118/72 | HR 72 | Temp 97.7°F | Ht 72.0 in | Wt 206.0 lb

## 2024-06-07 DIAGNOSIS — E119 Type 2 diabetes mellitus without complications: Secondary | ICD-10-CM | POA: Diagnosis not present

## 2024-06-07 NOTE — Progress Notes (Signed)
 Diabetes:  No meds.  Hypoglycemic episodes: no Hyperglycemic episodes:no Feet problems: no tingling.  Still has sensation in the feet.  Blood Sugars averaging: he had been checking but not recently.   eye exam within last year: done, Dr. Charmayne at [facility] ophthalmology.   HGB d/w pt, as it relates to A1c.   D/w pt about diet and limiting simple carbs.   Diet over the holidays d/w pt. Fructosamine converts to A1c 7.1. D/w pt at OV.   Meds, vitals, and allergies reviewed.   ROS: Per HPI unless specifically indicated in ROS section   GEN: nad, alert and oriented HEENT: ncat NECK: supple w/o LA CV: rrr. PULM: ctab, no inc wob ABD: soft, +bs EXT: no edema SKIN: well perfused.   Diabetic foot exam: Normal inspection No skin breakdown No calluses  Normal DP pulses Normal sensation to light touch and monofilament Nails slightly long but he is going to get them trimmed.

## 2024-06-07 NOTE — Patient Instructions (Signed)
 Recheck in about 6 months, labs ahead of a yearly visit.  Take care.  Glad to see you. Thank you for your effort.

## 2024-06-08 ENCOUNTER — Encounter: Payer: Self-pay | Admitting: Family Medicine

## 2024-06-10 NOTE — Assessment & Plan Note (Signed)
 HGB d/w pt, as it relates to A1c.   D/w pt about diet and limiting simple carbs.   Diet over the holidays d/w pt. Fructosamine converts to A1c 7.1. D/w pt at OV.  No change in meds.  Recheck in about 6 months, labs ahead of a yearly visit.

## 2024-06-21 ENCOUNTER — Ambulatory Visit: Admitting: Cardiovascular Disease

## 2024-11-14 ENCOUNTER — Ambulatory Visit

## 2024-11-29 ENCOUNTER — Other Ambulatory Visit

## 2024-12-06 ENCOUNTER — Encounter: Admitting: Family Medicine
